# Patient Record
Sex: Female | Born: 1980 | Race: Black or African American | Hispanic: Yes | Marital: Single | Smoking: Never smoker
Health system: Southern US, Community
[De-identification: ages and names within clinical notes are randomized; demographics above are authoritative.]

## PROBLEM LIST (undated history)

## (undated) DIAGNOSIS — M722 Plantar fascial fibromatosis: Secondary | ICD-10-CM

## (undated) DIAGNOSIS — G4730 Sleep apnea, unspecified: Secondary | ICD-10-CM

## (undated) DIAGNOSIS — F419 Anxiety disorder, unspecified: Secondary | ICD-10-CM

## (undated) DIAGNOSIS — T7840XA Allergy, unspecified, initial encounter: Secondary | ICD-10-CM

## (undated) DIAGNOSIS — E669 Obesity, unspecified: Secondary | ICD-10-CM

## (undated) HISTORY — PX: TUBAL LIGATION: SHX77

## (undated) HISTORY — DX: Allergy, unspecified, initial encounter: T78.40XA

## (undated) HISTORY — DX: Obesity, unspecified: E66.9

---

## 2004-11-12 ENCOUNTER — Ambulatory Visit: Payer: Self-pay | Admitting: Family Medicine

## 2004-12-24 ENCOUNTER — Ambulatory Visit: Payer: Self-pay | Admitting: Family Medicine

## 2005-03-05 ENCOUNTER — Ambulatory Visit: Payer: Self-pay | Admitting: Family Medicine

## 2005-07-24 ENCOUNTER — Ambulatory Visit: Payer: Self-pay | Admitting: Family Medicine

## 2005-09-30 ENCOUNTER — Ambulatory Visit: Payer: Self-pay | Admitting: Family Medicine

## 2006-02-26 ENCOUNTER — Ambulatory Visit: Payer: Self-pay | Admitting: Family Medicine

## 2006-03-05 ENCOUNTER — Ambulatory Visit (HOSPITAL_COMMUNITY): Admission: RE | Admit: 2006-03-05 | Discharge: 2006-03-05 | Payer: Self-pay | Admitting: Family Medicine

## 2007-01-13 ENCOUNTER — Ambulatory Visit: Payer: Self-pay | Admitting: Family Medicine

## 2007-04-28 ENCOUNTER — Ambulatory Visit: Payer: Self-pay | Admitting: Family Medicine

## 2007-05-13 ENCOUNTER — Ambulatory Visit: Payer: Self-pay | Admitting: Orthopedic Surgery

## 2007-05-21 ENCOUNTER — Ambulatory Visit (HOSPITAL_COMMUNITY): Admission: RE | Admit: 2007-05-21 | Discharge: 2007-05-21 | Payer: Self-pay | Admitting: Orthopedic Surgery

## 2007-06-01 ENCOUNTER — Ambulatory Visit: Payer: Self-pay | Admitting: Orthopedic Surgery

## 2007-06-04 ENCOUNTER — Ambulatory Visit (HOSPITAL_COMMUNITY): Admission: RE | Admit: 2007-06-04 | Discharge: 2007-06-04 | Payer: Self-pay | Admitting: Orthopedic Surgery

## 2007-06-04 ENCOUNTER — Ambulatory Visit: Payer: Self-pay | Admitting: Orthopedic Surgery

## 2007-06-04 ENCOUNTER — Encounter: Payer: Self-pay | Admitting: Orthopedic Surgery

## 2007-06-07 ENCOUNTER — Ambulatory Visit: Payer: Self-pay | Admitting: Orthopedic Surgery

## 2007-06-16 ENCOUNTER — Ambulatory Visit: Payer: Self-pay | Admitting: Orthopedic Surgery

## 2007-06-22 ENCOUNTER — Ambulatory Visit: Payer: Self-pay | Admitting: Orthopedic Surgery

## 2007-07-26 ENCOUNTER — Encounter (INDEPENDENT_AMBULATORY_CARE_PROVIDER_SITE_OTHER): Payer: Self-pay | Admitting: *Deleted

## 2007-07-26 ENCOUNTER — Encounter: Payer: Self-pay | Admitting: Family Medicine

## 2007-07-26 ENCOUNTER — Other Ambulatory Visit: Admission: RE | Admit: 2007-07-26 | Discharge: 2007-07-26 | Payer: Self-pay | Admitting: Family Medicine

## 2007-07-26 ENCOUNTER — Ambulatory Visit: Payer: Self-pay | Admitting: Family Medicine

## 2007-07-27 ENCOUNTER — Encounter: Payer: Self-pay | Admitting: Family Medicine

## 2007-07-27 LAB — CONVERTED CEMR LAB: GC Probe Amp, Genital: NEGATIVE

## 2007-07-28 ENCOUNTER — Encounter: Payer: Self-pay | Admitting: Family Medicine

## 2007-07-28 LAB — CONVERTED CEMR LAB
Candida species: NEGATIVE
Gardnerella vaginalis: NEGATIVE
Trichomonal Vaginitis: NEGATIVE

## 2007-07-31 ENCOUNTER — Encounter: Payer: Self-pay | Admitting: Family Medicine

## 2007-07-31 LAB — CONVERTED CEMR LAB
Chloride: 104 meq/L (ref 96–112)
Cholesterol: 166 mg/dL (ref 0–200)
Eosinophils Absolute: 0.1 10*3/uL (ref 0.0–0.7)
Glucose, Bld: 87 mg/dL (ref 70–99)
Lymphs Abs: 3 10*3/uL (ref 0.7–3.3)
MCV: 91.1 fL (ref 78.0–100.0)
Neutro Abs: 2.1 10*3/uL (ref 1.7–7.7)
Neutrophils Relative %: 37 % — ABNORMAL LOW (ref 43–77)
Platelets: 319 10*3/uL (ref 150–400)
Potassium: 4.1 meq/L (ref 3.5–5.3)
Sodium: 139 meq/L (ref 135–145)
Total CHOL/HDL Ratio: 2.5
Triglycerides: 46 mg/dL (ref <150)
VLDL: 9 mg/dL (ref 0–40)
WBC: 5.7 10*3/uL (ref 4.0–10.5)

## 2007-08-17 ENCOUNTER — Ambulatory Visit: Payer: Self-pay | Admitting: Family Medicine

## 2007-10-27 ENCOUNTER — Ambulatory Visit: Payer: Self-pay | Admitting: Family Medicine

## 2007-11-03 ENCOUNTER — Encounter (INDEPENDENT_AMBULATORY_CARE_PROVIDER_SITE_OTHER): Payer: Self-pay | Admitting: *Deleted

## 2008-01-27 ENCOUNTER — Encounter: Payer: Self-pay | Admitting: Orthopedic Surgery

## 2008-02-07 ENCOUNTER — Ambulatory Visit: Payer: Self-pay | Admitting: Family Medicine

## 2008-02-08 ENCOUNTER — Encounter: Payer: Self-pay | Admitting: Family Medicine

## 2008-02-10 DIAGNOSIS — E669 Obesity, unspecified: Secondary | ICD-10-CM

## 2008-03-24 ENCOUNTER — Telehealth (INDEPENDENT_AMBULATORY_CARE_PROVIDER_SITE_OTHER): Payer: Self-pay | Admitting: *Deleted

## 2008-03-28 ENCOUNTER — Ambulatory Visit: Payer: Self-pay | Admitting: Orthopedic Surgery

## 2008-03-28 DIAGNOSIS — IMO0002 Reserved for concepts with insufficient information to code with codable children: Secondary | ICD-10-CM | POA: Insufficient documentation

## 2008-04-27 ENCOUNTER — Ambulatory Visit: Payer: Self-pay | Admitting: Orthopedic Surgery

## 2008-08-31 ENCOUNTER — Ambulatory Visit: Payer: Self-pay | Admitting: Family Medicine

## 2008-08-31 LAB — CONVERTED CEMR LAB
Beta hcg, urine, semiquantitative: POSITIVE
Nitrite: NEGATIVE
Protein, U semiquant: NEGATIVE
Urobilinogen, UA: 0.2

## 2008-09-01 ENCOUNTER — Encounter: Payer: Self-pay | Admitting: Family Medicine

## 2008-09-03 DIAGNOSIS — N3000 Acute cystitis without hematuria: Secondary | ICD-10-CM | POA: Insufficient documentation

## 2009-05-25 ENCOUNTER — Ambulatory Visit: Payer: Self-pay | Admitting: Family Medicine

## 2009-05-25 LAB — CONVERTED CEMR LAB
BUN: 12 mg/dL (ref 6–23)
Chloride: 104 meq/L (ref 96–112)
Creatinine, Ser: 0.75 mg/dL (ref 0.40–1.20)
Eosinophils Absolute: 0.2 10*3/uL (ref 0.0–0.7)
Eosinophils Relative: 4 % (ref 0–5)
Glucose, Bld: 78 mg/dL (ref 70–99)
HCT: 38.4 % (ref 36.0–46.0)
Hemoglobin: 12.3 g/dL (ref 12.0–15.0)
LDL Cholesterol: 102 mg/dL — ABNORMAL HIGH (ref 0–99)
Lymphocytes Relative: 56 % — ABNORMAL HIGH (ref 12–46)
Lymphs Abs: 2.8 10*3/uL (ref 0.7–4.0)
MCV: 90.4 fL (ref 78.0–100.0)
Monocytes Absolute: 0.3 10*3/uL (ref 0.1–1.0)
Platelets: 326 10*3/uL (ref 150–400)
Potassium: 4.3 meq/L (ref 3.5–5.3)
RDW: 13.5 % (ref 11.5–15.5)
TSH: 1.637 microintl units/mL (ref 0.350–4.500)
VLDL: 13 mg/dL (ref 0–40)

## 2009-05-30 ENCOUNTER — Telehealth: Payer: Self-pay | Admitting: Family Medicine

## 2009-06-18 ENCOUNTER — Telehealth: Payer: Self-pay | Admitting: Family Medicine

## 2009-07-03 ENCOUNTER — Telehealth: Payer: Self-pay | Admitting: Family Medicine

## 2009-07-24 ENCOUNTER — Telehealth: Payer: Self-pay | Admitting: Family Medicine

## 2009-09-11 ENCOUNTER — Ambulatory Visit: Payer: Self-pay | Admitting: Family Medicine

## 2009-09-13 ENCOUNTER — Telehealth: Payer: Self-pay | Admitting: Family Medicine

## 2009-09-13 ENCOUNTER — Ambulatory Visit: Payer: Self-pay | Admitting: Family Medicine

## 2009-09-28 ENCOUNTER — Emergency Department (HOSPITAL_COMMUNITY): Admission: EM | Admit: 2009-09-28 | Discharge: 2009-09-28 | Payer: Self-pay | Admitting: Emergency Medicine

## 2009-11-14 ENCOUNTER — Ambulatory Visit: Payer: Self-pay | Admitting: Family Medicine

## 2010-01-15 ENCOUNTER — Ambulatory Visit: Payer: Self-pay | Admitting: Family Medicine

## 2010-01-15 DIAGNOSIS — N760 Acute vaginitis: Secondary | ICD-10-CM | POA: Insufficient documentation

## 2010-01-15 LAB — CONVERTED CEMR LAB
Bilirubin Urine: NEGATIVE
Glucose, Urine, Semiquant: NEGATIVE
Specific Gravity, Urine: 1.02
pH: 6.5

## 2010-01-17 ENCOUNTER — Encounter: Payer: Self-pay | Admitting: Physician Assistant

## 2010-01-17 LAB — CONVERTED CEMR LAB
Chlamydia, DNA Probe: NEGATIVE
GC Probe Amp, Genital: NEGATIVE

## 2010-02-11 ENCOUNTER — Ambulatory Visit: Payer: Self-pay | Admitting: Family Medicine

## 2010-02-11 ENCOUNTER — Encounter: Payer: Self-pay | Admitting: Physician Assistant

## 2010-02-11 DIAGNOSIS — J309 Allergic rhinitis, unspecified: Secondary | ICD-10-CM | POA: Insufficient documentation

## 2010-02-11 DIAGNOSIS — J019 Acute sinusitis, unspecified: Secondary | ICD-10-CM | POA: Insufficient documentation

## 2010-04-04 ENCOUNTER — Ambulatory Visit: Payer: Self-pay | Admitting: Family Medicine

## 2010-04-04 DIAGNOSIS — R5381 Other malaise: Secondary | ICD-10-CM | POA: Insufficient documentation

## 2010-04-04 DIAGNOSIS — M545 Low back pain, unspecified: Secondary | ICD-10-CM | POA: Insufficient documentation

## 2010-04-04 DIAGNOSIS — R5383 Other fatigue: Secondary | ICD-10-CM | POA: Insufficient documentation

## 2010-05-02 ENCOUNTER — Telehealth: Payer: Self-pay | Admitting: Family Medicine

## 2010-05-03 ENCOUNTER — Telehealth: Payer: Self-pay | Admitting: Family Medicine

## 2010-06-06 ENCOUNTER — Other Ambulatory Visit: Admission: RE | Admit: 2010-06-06 | Discharge: 2010-06-06 | Payer: Self-pay | Admitting: Family Medicine

## 2010-06-06 ENCOUNTER — Ambulatory Visit: Payer: Self-pay | Admitting: Family Medicine

## 2010-06-06 LAB — CONVERTED CEMR LAB
Basophils Absolute: 0 10*3/uL (ref 0.0–0.1)
CO2: 27 meq/L (ref 19–32)
Calcium: 9 mg/dL (ref 8.4–10.5)
Cholesterol: 162 mg/dL (ref 0–200)
Creatinine, Ser: 0.68 mg/dL (ref 0.40–1.20)
Eosinophils Absolute: 0.1 10*3/uL (ref 0.0–0.7)
Eosinophils Relative: 1 % (ref 0–5)
Glucose, Bld: 79 mg/dL (ref 70–99)
Glucose, Urine, Semiquant: NEGATIVE
HCT: 38.5 % (ref 36.0–46.0)
Hemoglobin: 13.1 g/dL (ref 12.0–15.0)
Lymphocytes Relative: 45 % (ref 12–46)
MCV: 92.1 fL (ref 78.0–100.0)
Monocytes Absolute: 0.4 10*3/uL (ref 0.1–1.0)
Nitrite: NEGATIVE
Protein, U semiquant: NEGATIVE
RDW: 12.6 % (ref 11.5–15.5)
TSH: 1.239 microintl units/mL (ref 0.350–4.500)
Urobilinogen, UA: 0.2
WBC Urine, dipstick: NEGATIVE
pH: 7.5

## 2010-06-07 ENCOUNTER — Encounter: Payer: Self-pay | Admitting: Family Medicine

## 2010-06-07 LAB — CONVERTED CEMR LAB
Candida species: NEGATIVE
Chlamydia, DNA Probe: NEGATIVE
GC Probe Amp, Genital: NEGATIVE

## 2010-06-11 ENCOUNTER — Encounter: Payer: Self-pay | Admitting: Family Medicine

## 2010-07-09 ENCOUNTER — Telehealth: Payer: Self-pay | Admitting: Family Medicine

## 2010-07-10 ENCOUNTER — Ambulatory Visit: Payer: Self-pay | Admitting: Family Medicine

## 2010-10-14 ENCOUNTER — Telehealth: Payer: Self-pay | Admitting: Family Medicine

## 2010-10-24 ENCOUNTER — Ambulatory Visit: Payer: Self-pay | Admitting: Family Medicine

## 2010-11-23 ENCOUNTER — Encounter: Payer: Self-pay | Admitting: Family Medicine

## 2010-11-24 ENCOUNTER — Encounter: Payer: Self-pay | Admitting: Family Medicine

## 2010-12-03 NOTE — Letter (Signed)
Summary: Out of Work  [facility]  [address]   [place], [place] [address]   Phone: [phone]  Fax: [phone]    February 11, 2010   Employee:  NGAN QUALLS Gottsch    To Whom It May Concern:   For Medical reasons, please excuse the above named employee from work for the following dates:  Start:   02/11/10  End:   02/13/10 may return to work without restriction.  If you need additional information, please feel free to contact our office.         Sincerely,    Esperanza Sheets PA

## 2010-12-03 NOTE — Assessment & Plan Note (Signed)
Summary: sick- room 2   Vital Signs:  Patient profile:   30 year old female Menstrual status:  irregular Height:      66 inches Weight:      227.75 pounds BMI:     36.89 O2 Sat:      98 % on Room air Pulse rate:   66 / minute Resp:     16 per minute BP sitting:   116 / 80  (left arm)  Vitals Entered By: Adella Hare LPN (February 11, 2010 9:25 AM) CC: body aches, chills, fatigue, nasal drainage, sore throat Is Patient Diabetic? No Pain Assessment Patient in pain? no        Primary Provider:  Syliva Overman MD  CC:  body aches, chills, fatigue, nasal drainage, and sore throat.  History of Present Illness: Pt is here today with c/o yellow nasal congestion, maxillary sinus pressure, & sore thrt.  Has been tired & had chills too.  Started in the last 3-4 days.  Worsening.  Taking prescription antihistamine & not helping. Not taking any other cold meds, or pain relievers.    Daughter recently had strep throat.  Pt states her throat feels irritated.  No pain with swallowing.  Also has been taking Phenteramine.  States her weight has been going down. Is up today which she wonders if may be because of her menses.  She has an appt next mos for follow up.  Current Medications (verified): 1)  Phentermine Hcl 37.5 Mg Tabs (Phentermine Hcl) .... Take 1 Tablet By Mouth Once A Day 2)  Zyrtec Hives Relief 10 Mg Tabs (Cetirizine Hcl) .... Take 1 Tablet By Mouth Once A Day 3)  Ciprofloxacin Hcl 500 Mg Tabs (Ciprofloxacin Hcl) .... Take 1 Tablet By Mouth Two Times A Day  Allergies (verified): No Known Drug Allergies  Past History:  Past medical history reviewed for relevance to current acute and chronic problems.  Past Medical History: Reviewed history from 11/03/2007 and no changes required. OBESITY (ICD-278.00)  Review of Systems General:  Complains of chills and fatigue; denies fever. ENT:  Complains of nasal congestion, postnasal drainage, sinus pressure, and sore throat; denies  earache. CV:  Denies chest pain or discomfort. Resp:  Complains of cough; denies sputum productive.  Physical Exam  General:  Well-developed,well-nourished,in no acute distress; alert,appropriate and cooperative throughout examination Head:  Normocephalic and atraumatic without obvious abnormalities. No apparent alopecia or balding. Ears:  External ear exam shows no significant lesions or deformities.  Otoscopic examination reveals clear canals, tympanic membranes are intact bilaterally without bulging, retraction, inflammation or discharge. Hearing is grossly normal bilaterally. Nose:  no external deformity, mucosal erythema, mucosal edema, L frontal sinus tenderness, L maxillary sinus tenderness, R frontal sinus tenderness, and R maxillary sinus tenderness.   Mouth:  good dentition.  Oroph cobblestoned, otherwise neg Neck:  No deformities, masses, or tenderness noted. Lungs:  Normal respiratory effort, chest expands symmetrically. Lungs are clear to auscultation, no crackles or wheezes. Heart:  Normal rate and regular rhythm. S1 and S2 normal without gallop, murmur, click, rub or other extra sounds. Cervical Nodes:  No lymphadenopathy noted Psych:  Cognition and judgment appear intact. Alert and cooperative with normal attention span and concentration. No apparent delusions, illusions, hallucinations   Impression & Recommendations:  Problem # 1:  SINUSITIS, ACUTE (ICD-461.9) Assessment New  The following medications were removed from the medication list:    Ciprofloxacin Hcl 500 Mg Tabs (Ciprofloxacin hcl) .Marland Kitchen... Take 1 tablet by mouth two times  a day Her updated medication list for this problem includes:    Cephalexin 500 Mg Tabs (Cephalexin) .Marland Kitchen... Take 2 two times a day x 10 days  Instructed on treatment. Call if symptoms persist or worsen.   Problem # 2:  ALLERGIC RHINITIS (ICD-477.9) Assessment: Unchanged Will continue antihistamine as needed. Her updated medication list for  this problem includes:    Zyrtec Hives Relief 10 Mg Tabs (Cetirizine hcl) .Marland Kitchen... Take 1 tablet by mouth once a day  Problem # 3:  OBESITY (ICD-278.00) Wt is down 1 # from prev visit.  Will continue Phentermine.  Encouraged healthy diet & exercise.  Ht: 66 (02/11/2010)   Wt: 227.75 (02/11/2010)   BMI: 36.89 (02/11/2010)  Complete Medication List: 1)  Phentermine Hcl 37.5 Mg Tabs (Phentermine hcl) .... Take 1 tablet by mouth once a day 2)  Zyrtec Hives Relief 10 Mg Tabs (Cetirizine hcl) .... Take 1 tablet by mouth once a day 3)  Cephalexin 500 Mg Tabs (Cephalexin) .... Take 2 two times a day x 10 days 4)  Fluconazole 150 Mg Tabs (Fluconazole) .... Take 1 by mouth as needed for yeast infection  Patient Instructions: 1)  Please schedule a follow-up appointment as needed. 2)  Keep your appt in May with Dr Lodema Hong. 3)  Get plenty of rest, drink lots of clear liquids, and use Tylenol or Ibuprofen for fever and comfort. Return in 7-10 days if you're not better:sooner if you're feeling worse. 4)  I have prescribed an antibiotics for you.  Take as directed on the bottle. Prescriptions: FLUCONAZOLE 150 MG TABS (FLUCONAZOLE) take 1 by mouth as needed for yeast infection  #2 x 0   Entered and Authorized by:   Esperanza Sheets PA   Signed by:   Esperanza Sheets PA on 02/11/2010   Method used:   Electronically to        [facility] (retail)       [address]       [place], [place]  [address]       Ph: [phone]       Fax: [phone]   RxID:   [id number] CEPHALEXIN 500 MG TABS (CEPHALEXIN) take 2 two times a day x 10 days  #40 x 0   Entered and Authorized by:   Esperanza Sheets PA   Signed by:   Esperanza Sheets PA on 02/11/2010   Method used:   Electronically to        [facility] (retail)       [address]       [place], [place]  [address]       Ph: [phone]       Fax: [phone]   RxID:   [id number]

## 2010-12-03 NOTE — Progress Notes (Signed)
  Phone Note Call from Patient   Summary of Call: Requesting refill on fluconazole 150mg  [facility] Initial call taken by: Everitt Amber LPN,  May 04, 5955 9:43 AM  Follow-up for Phone Call        pls advis pt to use otc mionistat Follow-up by: Syliva Overman MD,  May 03, 2010 11:04 AM  Additional Follow-up for Phone Call Additional follow up Details #1::        Patient didn't call and request, it was sent from the pharmacy. Called patient and no answer. If she calls requesting med I will advise her to use otc monistat Additional Follow-up by: Everitt Amber LPN,  May 03, 3874 12:58 PM

## 2010-12-03 NOTE — Assessment & Plan Note (Signed)
Summary: office visit   Vital Signs:  Patient profile:   30 year old female Menstrual status:  irregular Height:      66 inches Weight:      231.25 pounds BMI:     37.46 O2 Sat:      98 % Pulse rate:   89 / minute Pulse rhythm:   regular Resp:     16 per minute BP sitting:   120 / 80  (left arm) Cuff size:   large  Vitals Entered By: Everitt Amber LPN (June 06, 2010 10:06 AM)  Nutrition Counseling: Patient's BMI is greater than 25 and therefore counseled on weight management options. CC: CPE   Vision Screening:Left eye w/o correction: 20 / 20 Right Eye w/o correction: 20 / 20 Both eyes w/o correction:  20/ 20  Color vision testing: normal      Vision Entered By: Everitt Amber LPN (June 06, 2010 1:58 PM)   Primary Care Provider:  Syliva Overman MD  CC:  CPE .  History of Present Illness: Reports  that she has been  doing well. She continues to gain weight and is concerned about this and wants help. She is inconsistent in attemppts to lose weight. Denies recent fever or chills. Denies sinus pressure, nasal congestion , ear pain or sore throat. Denies chest congestion, or cough productive of sputum. Denies chest pain, palpitations, PND, orthopnea or leg swelling. Denies abdominal pain, nausea, vomitting, diarrhea or constipation. Denies change in bowel movements or bloody stool. Denies dincontinence or hesitancy. Denies  joint pain, swelling, or reduced mobility. Denies headaches, vertigo, seizures. Denies depression, anxiety or insomnia. Denies  rash, lesions, or itch.     Current Medications (verified): 1)  Phentermine Hcl 37.5 Mg Tabs (Phentermine Hcl) .... Take 1 Tablet By Mouth Once A Day 2)  Zyrtec Hives Relief 10 Mg Tabs (Cetirizine Hcl) .... Take 1 Tablet By Mouth Once A Day 3)  Ibuprofen 800 Mg Tabs (Ibuprofen) .... 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 Eyes:  Denies blurring and discharge. GU:  Complains of discharge, dysuria, and urinary frequency; 3 day history. Endo:  Denies cold intolerance, excessive hunger, excessive thirst, excessive urination, heat intolerance, polyuria, and weight change. Heme:  Denies abnormal bruising and bleeding. Allergy:  Denies hives or rash and itching eyes.  Physical Exam  General:  Well-developed,obese,in no acute distress; alert,appropriate and cooperative throughout examination Head:  Normocephalic and atraumatic without obvious abnormalities. No apparent alopecia or balding. Eyes:  No corneal or conjunctival inflammation noted. EOMI. Perrla. Funduscopic exam benign, without hemorrhages, exudates or papilledema. Vision grossly normal. Ears:  External ear exam shows no significant lesions or deformities.  Otoscopic examination reveals clear canals, tympanic membranes are intact bilaterally without bulging, retraction, inflammation or discharge. Hearing is grossly normal bilaterally. Nose:  External nasal examination shows no deformity or inflammation. Nasal mucosa are pink and moist without lesions or exudates. Mouth:  Oral mucosa and oropharynx without lesions or exudates.  Teeth in good repair. Neck:  No deformities, masses, or tenderness  noted. Chest Wall:  No deformities, masses, or tenderness noted. Breasts:  No mass, nodules, thickening, tenderness, bulging, retraction, inflamation, nipple discharge or skin changes noted.   Lungs:  Normal respiratory effort, chest expands symmetrically. Lungs are clear to auscultation, no crackles or wheezes. Heart:  Normal rate and regular rhythm. S1 and S2 normal  without gallop, murmur, click, rub or other extra sounds. Abdomen:  Bowel sounds positive,abdomen soft and non-tender without masses, organomegaly or hernias noted. Genitalia:  Normal introitus for age, no external lesions, white vaginal discharge, mucosa pink and moist, no vaginal or cervical lesions, no vaginal atrophy, no friaility or hemorrhage, normal uterus size and position, no adnexal masses or tenderness Msk:  No deformity or scoliosis noted of thoracic or lumbar spine.   Pulses:  R and L carotid,radial,femoral,dorsalis pedis and posterior tibial pulses are full and equal bilaterally Extremities:  No clubbing, cyanosis, edema, or deformity noted with normal full range of motion of all joints.   Neurologic:  No cranial nerve deficits noted. Station and gait are normal. Plantar reflexes are down-going bilaterally. DTRs are symmetrical throughout. Sensory, motor and coordinative functions appear intact. Skin:  Intact without suspicious lesions or rashes Cervical Nodes:  No lymphadenopathy noted Axillary Nodes:  No palpable lymphadenopathy Inguinal Nodes:  No significant adenopathy Psych:  Cognition and judgment appear intact. Alert and cooperative with normal attention span and concentration. No apparent delusions, illusions, hallucinations   Impression & Recommendations:  Problem # 1:  SCREENING FOR MALIGNANT NEOPLASM OF THE CERVIX (ICD-V76.2) Assessment Comment Only  Orders: Pap Smear ([address])  Problem # 2:  ACUTE CYSTITIS (ICD-595.0) Assessment: Comment Only  Orders: UA Dipstick W/ Micro (manual) (81000)  Encouraged to push clear liquids, get enough rest, and take acetaminophen as needed. To be seen in 10 days if no improvement, sooner if worse.  Problem # 3:  VULVOVAGINITIS (ICD-616.10) Assessment: Comment Only  Orders: T-Wet Prep by Molecular Probe [id number]) T-Chlamydia & GC Probe, Genital ([id number])  Problem # 4:  OBESITY (ICD-278.00) Assessment:  Deteriorated  Ht: 66 (06/06/2010)   Wt: 231.25 (06/06/2010)   BMI: 37.46 (06/06/2010)  Complete Medication List: 1)  Zyrtec Hives Relief 10 Mg Tabs (Cetirizine hcl) .... Take 1 tablet by mouth once a day 2)  Ibuprofen 800 Mg Tabs (Ibuprofen) .... Take 1 three times a day as needed for back pain 3)  Phentermine Hcl 37.5 Mg Caps (Phentermine hcl) .... Take 1 capsule by mouth once a day  Other Orders: T-Basic Metabolic Panel [id number]) T-CBC w/Diff [id number]) T-Lipid Profile [id number]) T-TSH [phone])  Patient Instructions: 1)  Please schedule a follow-up appointment in 4 weeks 2)  It is important that you exercise regularly at least 60 minutes 7 times a week. If you develop chest pain, have severe difficulty breathing, or feel very tired , stop exercising immediately and seek medical attention. 3)  You need to lose weight. Consider a lower calorie diet and regular exercise. goal is 5 to8 pounds 4)  pls start taking one phenterminwe daily, and it is essential that you eat regulalrly. 5)  I will discuss at legnth healthyeating with you, and give you additional info Prescriptions: PHENTERMINE HCL 37.5 MG CAPS (PHENTERMINE HCL) Take 1 capsule by mouth once a day  #30 x 0   Entered and Authorized by:   Syliva Overman MD   Signed by:   Syliva Overman MD on 06/06/2010   Method used:   Printed then faxed to ...       [facility] [address] [facility] ([facility])       [address]       [place], [place]  [address]       Ph: [phone]       Fax: [phone]   RxID:   [id number]    Laboratory Results   Urine Tests    Routine Urinalysis   Color: lt. yellow Appearance: Clear Glucose: negative   (Normal Range: Negative) Bilirubin: negative   (Normal Range: Negative) Ketone: negative   (Normal Range: Negative) Spec. Gravity: 1.020   (Normal Range: 1.003-1.035) Blood: moderate   (Normal Range: Negative) pH: 7.5   (Normal Range: 5.0-8.0) Protein:  negative   (Normal Range: Negative) Urobilinogen: 0.2   (Normal Range: 0-1) Nitrite: negative   (Normal Range: Negative) Leukocyte Esterace: negative   (Normal Range: Negative)    Comments: period is on

## 2010-12-03 NOTE — Assessment & Plan Note (Signed)
Summary: office visit   Vital Signs:  Patient profile:   30 year old female Menstrual status:  irregular Height:      66 inches Weight:      233 pounds BMI:     37.74 O2 Sat:      98 % on Room air Pulse rate:   101 / minute Pulse rhythm:   regular Resp:     16 per minute BP sitting:   120 / 72  (left arm)  Vitals Entered By: [name] LPN (November 14, 2009 3:45 PM)  Nutrition Counseling: Patient's BMI is greater than 25 and therefore counseled on weight management options.  O2 Flow:  Room air CC: follow up/ cough and congestion and ear ache Is Patient Diabetic? No Pain Assessment Patient in pain? no        Primary Care Provider:  Syliva Overman MD  CC:  follow up/ cough and congestion and ear ache.  History of Present Illness: Pt acutely ill over the past 2 days with resppiratory symptoms. She has no major health concerns otherwise , apart from the fact that she has gained weight. She has not been exercisisng, has overdone sweets during the holidays, and had also recently dioscontinued phentermine, which she states helps alot and she wants to resume it.  Current Medications (verified): 1)  Phentermine Hcl 37.5 Mg Caps (Phentermine Hcl) .... Take 1 Tablet By Mouth Once A Day  Allergies (verified): No Known Drug Allergies  Review of Systems      See HPI General:  Complains of chills, fatigue, and fever; 2 day history of intermittent chills and fever. Eyes:  Denies blurring and discharge. ENT:  Complains of earache, nasal congestion, postnasal drainage, and sinus pressure; 2 day history, left ear pressure, green post nasal drainage with sinus pressure. CV:  Denies chest pain or discomfort, palpitations, and swelling of feet. Resp:  Complains of cough and sputum productive; yellow sputum and cough x 2 days. GI:  Denies abdominal pain, constipation, nausea, and vomiting. GU:  Denies dysuria and urinary frequency. MS:  Denies joint pain and joint swelling. Neuro:   Complains of headaches; denies seizures and sensation of room spinning; new headache associated with sinus pressure. Psych:  Denies anxiety and depression.  Physical Exam  General:  Well-developed,obese,in no acute distress; alert,appropriate and cooperative throughout examination HEENT: No facial asymmetry,  EOMI,frontal and maxillary sinus tenderness,Right  TM Clear,left Tm dull, fluid behind drum oropharynx  pink and moist.   Chest: decreased air entry , bilateral crackles and few wheezes  CVS: S1, S2, No murmurs, No S3.   Abd: Soft, Nontender.  MS: Adequate ROM spine, hips, shoulders and knees.  Ext: No edema.   CNS: CN 2-12 intact, power tone and sensation normal throughout.   Skin: Intact, no visible lesions or rashes.  Psych: Good eye contact, normal affect.  Memory intact, not anxious or depressed appearing.    Impression & Recommendations:  Problem # 1:  ACUTE BRONCHITIS (ICD-466.0) Assessment Comment Only  The following medications were removed from the medication list:    Septra Ds 800-160 Mg Tabs (Sulfamethoxazole-trimethoprim) .Marland Kitchen... Take 1 tablet by mouth two times a day Her updated medication list for this problem includes:    Penicillin V Potassium 500 Mg Tabs (Penicillin v potassium) .Marland Kitchen... Take 1 tablet by mouth three times a day    Tessalon Perles 100 Mg Caps (Benzonatate) .Marland Kitchen... Take 1 capsule by mouth three times a day  Problem # 2:  ACUTE SINUSITIS,  UNSPECIFIED (ICD-461.9) Assessment: Comment Only  The following medications were removed from the medication list:    Septra Ds 800-160 Mg Tabs (Sulfamethoxazole-trimethoprim) .Marland Kitchen... Take 1 tablet by mouth two times a day Her updated medication list for this problem includes:    Penicillin V Potassium 500 Mg Tabs (Penicillin v potassium) .Marland Kitchen... Take 1 tablet by mouth three times a day    Tessalon Perles 100 Mg Caps (Benzonatate) .Marland Kitchen... Take 1 capsule by mouth three times a day  Problem # 3:  LOM  (ICD-382.9) Assessment: Comment Only  The following medications were removed from the medication list:    Septra Ds 800-160 Mg Tabs (Sulfamethoxazole-trimethoprim) .Marland Kitchen... Take 1 tablet by mouth two times a day Her updated medication list for this problem includes:    Penicillin V Potassium 500 Mg Tabs (Penicillin v potassium) .Marland Kitchen... Take 1 tablet by mouth three times a day  Problem # 4:  OBESITY (ICD-278.00) Assessment: Deteriorated  Ht: 66 (11/14/2009)   Wt: 233 (11/14/2009)   BMI: 37.74 (11/14/2009), pt to resume phentermine one daily an regular exercise with dietary change  Complete Medication List: 1)  Phentermine Hcl 37.5 Mg Caps (Phentermine hcl) .... Take 1 tablet by mouth once a day 2)  Penicillin V Potassium 500 Mg Tabs (Penicillin v potassium) .... Take 1 tablet by mouth three times a day 3)  Tessalon Perles 100 Mg Caps (Benzonatate) .... Take 1 capsule by mouth three times a day 4)  Fluconazole 150 Mg Tabs (Fluconazole) .... Take 1 tablet by mouth once a day as needed  Patient Instructions: 1)  Please schedule a follow-up appointment in 2 months. 2)  It is important that you exercise regularly at least 20 minutes 5 times a week. If you develop chest pain, have severe difficulty breathing, or feel very tired , stop exercising immediately and seek medical attention. 3)  You need to lose weight. Consider a lower calorie diet and regular exercise.  4)  You are being rtreated for sinusitis and bronchitis and left serous otitis, meda are sent to your pharmacy Prescriptions: FLUCONAZOLE 150 MG TABS (FLUCONAZOLE) Take 1 tablet by mouth once a day as needed  #3 x 0   Entered and Authorized by:   Syliva Overman MD   Signed by:   Syliva Overman MD on 11/14/2009   Method used:   Printed then faxed to ...       [facility] [address]       [address]       [place], [place]  [address]       Ph: [phone]       Fax: [phone]   RxID:    [id number] TESSALON PERLES 100 MG CAPS (BENZONATATE) Take 1 capsule by mouth three times a day  #30 x 0   Entered and Authorized by:   Syliva Overman MD   Signed by:   Syliva Overman MD on 11/14/2009   Method used:   Printed then faxed to ...       [facility] (retail)       [address]       [place], [place]  [address]       Ph: [phone]       Fax: [phone]   RxID:   [id number] PENICILLIN V POTASSIUM 500 MG TABS (PENICILLIN V POTASSIUM) Take 1 tablet by mouth three times a day  #  30 x 0   Entered and Authorized by:   Syliva Overman MD   Signed by:   Syliva Overman MD on 11/14/2009   Method used:   Electronically to        [facility] (retail)       [address]       [place], [place]  [address]       Ph: [phone]       Fax: [phone]   RxID:   [id number]

## 2010-12-03 NOTE — Letter (Signed)
Summary: Out of Work  [facility]  [address]   [place], [place] [address]   Phone: [phone]  Fax: [phone]    November 14, 2009   Employee:  ANANDA CAYA Paull    To Whom It May Concern:   For Medical reasons, please excuse the above named employee from work for the following dates:  Start:   11/14/09  End:   11/19/09 to return with no restrictions  If you need additional information, please feel free to contact our office.         Sincerely,    Milus Mallick. Lodema Hong, MD

## 2010-12-03 NOTE — Letter (Signed)
Summary: Letter  Letter   Imported By: Lind Guest 06/12/2010 10:53:12  _____________________________________________________________________  External Attachment:    Type:   Image     Comment:   External Document

## 2010-12-03 NOTE — Progress Notes (Signed)
Summary: rx for sinus infection  Phone Note Call from Patient   Summary of Call: would like to get a rx for sinus infection. [phone]  [place] Initial call taken by: Rudene Anda,  July 09, 2010 3:26 PM  Follow-up for Phone Call        this patient needs to schedule ov Follow-up by: Adella Hare LPN,  July 09, 2010 3:31 PM  Additional Follow-up for Phone Call Additional follow up Details #1::        left message for pt to call office for appt.  Additional Follow-up by: Rudene Anda,  July 10, 2010 8:03 AM

## 2010-12-03 NOTE — Progress Notes (Signed)
Summary: med  Phone Note Call from Patient   Summary of Call: pt needs a diflucan pill called into pharm [phone] Initial call taken by: Rudene Anda,  May 02, 2010 9:19 AM  Follow-up for Phone Call        returned call, left message Follow-up by: Adella Hare LPN,  May 02, 2010 11:38 AM  Additional Follow-up for Phone Call Additional follow up Details #1::        pls advise otc monistat Additional Follow-up by: Syliva Overman MD,  May 02, 2010 11:48 AM    Additional Follow-up for Phone Call Additional follow up Details #2::    PATIENT AWARE  Follow-up by: Lind Guest,  May 03, 2010 8:05 AM

## 2010-12-03 NOTE — Assessment & Plan Note (Signed)
Summary: weight and fatigue - room 1   Vital Signs:  Patient profile:   30 year old female Menstrual status:  irregular Height:      66 inches Weight:      224.25 pounds BMI:     36.33 O2 Sat:      99 % on Room air Pulse rate:   88 / minute Resp:     16 per minute BP sitting:   110 / 70  (left arm)  Vitals Entered By: Adella Hare LPN (April 04, 1609 3:14 PM)  Nutrition Counseling: Patient's BMI is greater than 25 and therefore counseled on weight management options. CC: fatigue and weight Is Patient Diabetic? No Pain Assessment Patient in pain? no        Primary Provider:  Syliva Overman MD  CC:  fatigue and weight.  History of Present Illness: Pt is here today with c/o fatigue x 1 week.  Sometimes has difficulty falling asleep, and usually gets up once a night to use the restroom & get a drink.  Is able to fall back asleep OK.  This is no change for her.  Also c/o LBP x 1 week.  No prev hx of.  Uncertain if lifted something heavy at work.  Not taking any meds for.  No dysuria or frequency.  Pt has intermittent pain in her lower legs.  She is somewhat vague about this.  She did have the pain before her back started hurting.  She points to her anterior shin area & states it comes & goes.  Trying to lose wt.  Has decreased her junk food.  Is taking Phentermine daily.  Walks about 3 days per week.    Hx of allergic rhinitis.  Not taking her allergy meds.  No fever, chills, or sinus pressure.   Current Medications (verified): 1)  Phentermine Hcl 37.5 Mg Tabs (Phentermine Hcl) .... Take 1 Tablet By Mouth Once A Day 2)  Zyrtec Hives Relief 10 Mg Tabs (Cetirizine Hcl) .... Take 1 Tablet By Mouth Once A Day  Allergies (verified): No Known Drug Allergies  Past History:  Past medical history reviewed for relevance to current acute and chronic problems.  Past Medical History: Reviewed history from 11/03/2007 and no changes required. OBESITY  (ICD-278.00)  Review of Systems General:  Complains of fatigue; denies chills and fever. ENT:  Complains of nasal congestion; denies earache, sinus pressure, and sore throat. CV:  Denies chest pain or discomfort. Resp:  Complains of cough; denies shortness of breath and sputum productive. GU:  Denies dysuria and urinary frequency. MS:  Complains of low back pain. Neuro:  Denies numbness and tingling.  Physical Exam  General:  Well-developed,well-nourished,in no acute distress; alert,appropriate and cooperative throughout examination Head:  Normocephalic and atraumatic without obvious abnormalities. No apparent alopecia or balding. Ears:  External ear exam shows no significant lesions or deformities.  Otoscopic examination reveals clear canals, tympanic membranes are intact bilaterally without bulging, retraction, inflammation or discharge. Hearing is grossly normal bilaterally. Nose:  Nasal turbs mod swollen and bluishno external deformity and no sinus percussion tenderness.   Mouth:  Oral mucosa and oropharynx without lesions or exudates.  Teeth in good repair. Neck:  No deformities, masses, or tenderness noted. Lungs:  Normal respiratory effort, chest expands symmetrically. Lungs are clear to auscultation, no crackles or wheezes. Heart:  Normal rate and regular rhythm. S1 and S2 normal without gallop, murmur, click, rub or other extra sounds. Msk:  TTP Bilat L5  S1 area.  Nontender sciatic notch. Able to stand heels and toes.  FROM. Pulses:  R posterior tibial normal, R dorsalis pedis normal, L posterior tibial normal, and L dorsalis pedis normal.   Extremities:  No clubbing, cyanosis, edema, or deformity noted with normal full range of motion of all joints.   Neurologic:  alert & oriented X3, strength normal in all extremities, gait normal, and DTRs symmetrical and normal.   Skin:  Intact without suspicious lesions or rashes Cervical Nodes:  No lymphadenopathy noted Psych:  Cognition  and judgment appear intact. Alert and cooperative with normal attention span and concentration. No apparent delusions, illusions, hallucinations   Impression & Recommendations:  Problem # 1:  ALLERGIC RHINITIS (ICD-477.9) Assessment Deteriorated  Her updated medication list for this problem includes:    Zyrtec Hives Relief 10 Mg Tabs (Cetirizine hcl) .Marland Kitchen... Take 1 tablet by mouth once a day  Problem # 2:  FATIGUE (ICD-780.79) Assessment: New  Orders: T-CBC No Diff ([id number]) T-TSH ([id number])  Problem # 3:  BACK PAIN, LUMBAR (ICD-724.2) Assessment: New  Her updated medication list for this problem includes:    Ibuprofen 800 Mg Tabs (Ibuprofen) .Marland Kitchen... Take 1 three times a day as needed for back pain  Problem # 4:  OBESITY (ICD-278.00) Assessment: Improved Pt has only lost 3 # in the last couple of mos. Discussed diet and increase aerobic exercise.  Also encouraged strength training. Portion sized h/o given.  Complete Medication List: 1)  Phentermine Hcl 37.5 Mg Tabs (Phentermine hcl) .... Take 1 tablet by mouth once a day 2)  Zyrtec Hives Relief 10 Mg Tabs (Cetirizine hcl) .... Take 1 tablet by mouth once a day 3)  Ibuprofen 800 Mg Tabs (Ibuprofen) .... Take 1 three times a day as needed for back pain  Patient Instructions: 1)  Please schedule a follow-up appointment in 1 month. 2)  Increase your exercise and improve your diet. 3)  I have refilled your Phentermine.  You need to lose at least 4 pounds in the next month in order to consider this medication. 4)  I have ordered blood work. 5)  I have prescribed Ibuprofen for your back pain. Prescriptions: IBUPROFEN 800 MG TABS (IBUPROFEN) take 1 three times a day as needed for back pain  #30 x 0   Entered and Authorized by:   Esperanza Sheets PA   Signed by:   Esperanza Sheets PA on 04/04/2010   Method used:   Printed then faxed to ...       [facility] (retail)       [address]       [place], [place]  [address]       Ph: [phone]       Fax: [phone]   RxID:   [id number] PHENTERMINE HCL 37.5 MG TABS (PHENTERMINE HCL) Take 1 tablet by mouth once a day  #30 x 0   Entered and Authorized by:   Esperanza Sheets PA   Signed by:   Esperanza Sheets PA on 04/04/2010   Method used:   Printed then faxed to ...       [facility] (retail)       [address]       [place], [place]  [address]       Ph: [phone]       Fax: [phone]   RxID:  [id number]  

## 2010-12-03 NOTE — Assessment & Plan Note (Signed)
Summary: sick- room 1   Vital Signs:  Patient profile:   30 year old female Menstrual status:  irregular Height:      66 inches Weight:      223.25 pounds BMI:     36.16 O2 Sat:      100 % on Room air Temp:     98.5 degrees F Pulse rate:   106 / minute Resp:     16 per minute BP sitting:   110 / 70  (left arm)  Vitals Entered By: Adella Hare LPN (July 10, 2010 3:19 PM) CC: head congestion, chills, body aches, headache Is Patient Diabetic? No Pain Assessment Patient in pain? no      Comments did not bring meds to ov also wants refill on phentermine   Primary Provider:  Syliva Overman MD  CC:  head congestion, chills, body aches, and headache.  History of Present Illness: Pt presents today with c/o nasal congestion, post nasal drainage and cough.  Sxs worsening x 5-6 days.  Has not tried any over the counter cold meds.  No fever.   Has been taking Phentermine and doing well.  Makes her mouth dry and she wonders if this is nl.  No palpitations.  She is eating less and healthier.  She is happy with her wt loss and states she is feeling much better overall.  Denies: Headache, or dizziness. Chest pain or discomfort, palpitations, and peripheral edema. Abd pain, indigestion, and no change in bowel habits. No joint pain or swelling.    Allergies (verified): No Known Drug Allergies  Past History:  Past medical history reviewed for relevance to current acute and chronic problems.  Past Medical History: Reviewed history from 11/03/2007 and no changes required. OBESITY (ICD-278.00)  Review of Systems      See HPI General:  Complains of chills; denies fever. ENT:  Complains of nasal congestion, postnasal drainage, and sinus pressure; denies earache and sore throat. GI:  Denies abdominal pain, change in bowel habits, nausea, and vomiting.  Physical Exam  General:  Well-developed,well-nourished,in no acute distress; alert,appropriate and cooperative throughout  examination Head:  Normocephalic and atraumatic without obvious abnormalities. No apparent alopecia or balding. Ears:  External ear exam shows no significant lesions or deformities.  Otoscopic examination reveals clear canals, tympanic membranes are intact bilaterally without bulging, retraction, inflammation or discharge. Hearing is grossly normal bilaterally. Nose:  no external deformity.  nasal turbs mod swollen and erythematous. + TTP bilat maxillary sinuses Mouth:  Oral mucosa and oropharynx without lesions or exudates.  Teeth in good repair. Neck:  No deformities, masses, or tenderness noted. Lungs:  Normal respiratory effort, chest expands symmetrically. Lungs are clear to auscultation, no crackles or wheezes. Heart:  Normal rate and regular rhythm. S1 and S2 normal without gallop, murmur, click, rub or other extra sounds. Cervical Nodes:  No lymphadenopathy noted Psych:  Cognition and judgment appear intact. Alert and cooperative with normal attention span and concentration. No apparent delusions, illusions, hallucinations   Impression & Recommendations:  Problem # 1:  SINUSITIS, ACUTE (ICD-461.9) Assessment New  Her updated medication list for this problem includes:    Amoxicillin 875 Mg Tabs (Amoxicillin) .Marland Kitchen... Take 1 two times a day x 10 days  Problem # 2:  OBESITY (ICD-278.00) Assessment: Improved Congratulated and encouraged pt with her wt loss.  Ht: 66 (07/10/2010)   Wt: 223.25 (07/10/2010)   BMI: 36.16 (07/10/2010)  Complete Medication List: 1)  Zyrtec Hives Relief 10 Mg Tabs (Cetirizine  hcl) .... Take 1 tablet by mouth once a day 2)  Ibuprofen 800 Mg Tabs (Ibuprofen) .... Take 1 three times a day as needed for back pain 3)  Phentermine Hcl 37.5 Mg Caps (Phentermine hcl) .... Take 1 capsule by mouth once a day 4)  Amoxicillin 875 Mg Tabs (Amoxicillin) .... Take 1 two times a day x 10 days 5)  Fluconazole 150 Mg Tabs (Fluconazole) .... Take 1 by mouth every 3 days as  needed for yeast infection  Patient Instructions: 1)  Get plenty of rest, drink lots of clear liquids, and use Tylenol or Ibuprofen for fever and comfort. Return in 7-10 days if you're not better:sooner if you're feeling worse. 2)  You may use an over the counter cold medication with a decongestant but stop your Phentermine for a couple days. 3)  I have prescribed an antibiotic for you. 4)  I have refilled your allergy medicine. Prescriptions: FLUCONAZOLE 150 MG TABS (FLUCONAZOLE) take 1 by mouth every 3 days as needed for yeast infection  #2 x 0   Entered and Authorized by:   Esperanza Sheets PA   Signed by:   Esperanza Sheets PA on 07/10/2010   Method used:   Electronically to        [facility] (retail)       [address]       [place], [place]  [address]       Ph: [phone]       Fax: [phone]   RxID:   [id number] AMOXICILLIN 875 MG TABS (AMOXICILLIN) take 1 two times a day x 10 days  #20 x 0   Entered and Authorized by:   Esperanza Sheets PA   Signed by:   Esperanza Sheets PA on 07/10/2010   Method used:   Electronically to        [facility] (retail)       [address]       [place], [place]  [address]       Ph: [phone]       Fax: [phone]   RxID:   [id number] ZYRTEC HIVES RELIEF 10 MG TABS (CETIRIZINE HCL) Take 1 tablet by mouth once a day  #30 x 4   Entered and Authorized by:   Esperanza Sheets PA   Signed by:   Esperanza Sheets PA on 07/10/2010   Method used:   Electronically to        [facility] (retail)       [address]       [place], [place]  [address]       Ph: [phone]       Fax: [phone]   RxID:   [id number] PHENTERMINE HCL 37.5 MG CAPS (PHENTERMINE HCL) Take 1 capsule by mouth once a day  #30 x 0   Entered and Authorized by:   Esperanza Sheets PA   Signed by:   Esperanza Sheets PA on 07/10/2010   Method used:   Printed then faxed to ...       [facility] (retail)       [address]       [place], [place]  [address]       Ph: [phone]       Fax: [phone]   RxID:  [id number] ZYRTEC HIVES RELIEF 10 MG TABS (CETIRIZINE HCL) Take 1 tablet by mouth once a day  #30 x 4   Entered and Authorized by:   Esperanza Sheets PA   Signed by:   Esperanza Sheets PA on 07/10/2010   Method used:   Electronically to        [facility] (retail)       [address]       [place], [place]  [address]       Ph: [phone]       Fax: [phone]   RxID:   [id number]  pt requested change pharmacy to [facility]. Esperanza Sheets PA  July 10, 2010 3:28 PM

## 2010-12-03 NOTE — Assessment & Plan Note (Signed)
Summary: office visit   Vital Signs:  Patient profile:   30 year old female Menstrual status:  irregular Height:      66 inches Weight:      228 pounds BMI:     36.93 O2 Sat:      98 % Pulse rate:   88 / minute Pulse rhythm:   regular Resp:     16 per minute BP sitting:   112 / 84 Cuff size:   large  Vitals Entered By: Everitt Amber LPN (January 15, 2010 3:33 PM)  Nutrition Counseling: Patient's BMI is greater than 25 and therefore counseled on weight management options. CC: Follow up chronic problems   Primary Care Provider:  Syliva Overman MD  CC:  Follow up chronic problems.  History of Present Illness: Reports  that she has been doinfg fairly well.  She does notice reduction in her apetite, with phentermine and wants to contnue this. She laso intends to inc her exercise routine. Denies recent fever or chills. Denies sinus pressure, nasal congestion , ear pain or sore throat. Denies chest congestion, or cough productive of sputum. Denies chest pain, palpitations, PND, orthopnea or leg swelling. Denies abdominal pain, nausea, vomitting, diarrhea or constipation. Denies change in bowel movements or bloody stool.  Denies  joint pain, swelling, or reduced mobility. Denies headaches, vertigo, seizures. Denies depression, anxiety or insomnia. Denies  rash, lesions, or itch.     Current Medications (verified): 1)  Phentermine Hcl 37.5 Mg Caps (Phentermine Hcl) .... Take 1 Tablet By Mouth Once A Day  Allergies (verified): No Known Drug Allergies  Review of Systems      See HPI GU:  Complains of discharge, dysuria, and urinary frequency; 1 week his, including  low back pain , no fever or chills. Endo:  Denies cold intolerance, excessive hunger, excessive thirst, excessive urination, heat intolerance, polyuria, and weight change. Heme:  Denies abnormal bruising and bleeding. Allergy:  Complains of seasonal allergies.  Physical Exam  General:   Well-developed,well-nourished,in no acute distress; alert,appropriate and cooperative throughout examination HEENT: No facial asymmetry,  EOMI, No sinus tenderness, TM's Clear, oropharynx  pink and moist.   Chest: Clear to auscultation bilaterally.  CVS: S1, S2, No murmurs, No S3.   Abd: Soft, Nontender.  MS: Adequate ROM spine, hips, shoulders and knees.  Ext: No edema.   CNS: CN 2-12 intact, power tone and sensation normal throughout.   Skin: Intact, no visible lesions or rashes.  Psych: Good eye contact, normal affect.  Memory intact, not anxious or depressed appearing. gU: uterus normal sized, no adnexal or cervical motion tenderness, white vaginal d/c   Impression & Recommendations:  Problem # 1:  ACUTE CYSTITIS (ICD-595.0) Assessment Comment Only  The following medications were removed from the medication list:    Penicillin V Potassium 500 Mg Tabs (Penicillin v potassium) .Marland Kitchen... Take 1 tablet by mouth three times a day Her updated medication list for this problem includes:    Ciprofloxacin Hcl 500 Mg Tabs (Ciprofloxacin hcl) .Marland Kitchen... Take 1 tablet by mouth two times a day  Orders: UA Dipstick W/ Micro (manual) ([address]) T-Culture, Urine ([id number])  Problem # 2:  VULVOVAGINITIS (ICD-616.10) Assessment: Comment Only  The following medications were removed from the medication list:    Penicillin V Potassium 500 Mg Tabs (Penicillin v potassium) .Marland Kitchen... Take 1 tablet by mouth three times a day Her updated medication list for this problem includes:    Ciprofloxacin Hcl 500 Mg Tabs (Ciprofloxacin hcl) .Marland Kitchen... Take  1 tablet by mouth two times a day  Orders: T-Wet Prep by Molecular Probe [id number]) T-Chlamydia & GC Probe, Genital ([id number])  Problem # 3:  OBESITY (ICD-278.00) Assessment: Improved  Ht: 66 (01/15/2010)   Wt: 228 (01/15/2010)   BMI: 36.93 (01/15/2010)  Complete Medication List: 1)  Phentermine Hcl 37.5 Mg Caps (Phentermine hcl) .... Take 1 tablet by mouth  once a day 2)  Phentermine Hcl 37.5 Mg Tabs (Phentermine hcl) .... Take 1 tablet by mouth once a day 3)  Zyrtec Hives Relief 10 Mg Tabs (Cetirizine hcl) .... Take 1 tablet by mouth once a day 4)  Ciprofloxacin Hcl 500 Mg Tabs (Ciprofloxacin hcl) .... Take 1 tablet by mouth two times a day  Patient Instructions: 1)  Please schedule a follow-up appointment in 2 months. 2)  It is important that you exercise regularly at least 45 minutes 6 times a week. If you develop chest pain, have severe difficulty breathing, or feel very tired , stop exercising immediately and seek medical attention. 3)  You need to lose weight. Consider a lower calorie diet and regular exercise.  Prescriptions: CIPROFLOXACIN HCL 500 MG TABS (CIPROFLOXACIN HCL) Take 1 tablet by mouth two times a day  #10 x 0   Entered by:   Everitt Amber LPN   Authorized by:   Syliva Overman MD   Signed by:   Everitt Amber LPN on 82/95/6213   Method used:   Electronically to        [facility] (retail)       [address]       [place], [place]  [address]       Ph: [phone]       Fax: [phone]   RxID:   [id number] ZYRTEC HIVES RELIEF 10 MG TABS (CETIRIZINE HCL) Take 1 tablet by mouth once a day  #30 x 4   Entered and Authorized by:   Syliva Overman MD   Signed by:   Syliva Overman MD on 01/15/2010   Method used:   Electronically to        [facility] (retail)       [address]       [place], [place]  [address]       Ph: [phone]       Fax: [phone]   RxID:   [id number] PHENTERMINE HCL 37.5 MG TABS (PHENTERMINE HCL) Take 1 tablet by mouth once a day  #30 x 1   Entered and Authorized by:   Syliva Overman MD   Signed by:   Syliva Overman MD on 01/15/2010   Method used:   Printed then faxed to ...       [facility] (retail)       [address]       [place], [place]  [address]       Ph: [phone]       Fax: [phone]   RxID:    [id number]   Laboratory Results   Urine Tests    Routine Urinalysis   Color: yellow Appearance: Clear Glucose: negative   (Normal Range: Negative) Bilirubin: negative   (Normal Range: Negative) Ketone: trace (5)   (Normal Range: Negative) Spec. Gravity: 1.020   (Normal Range: 1.003-1.035) Blood: negative   (Normal Range: Negative) pH: 6.5   (Normal Range: 5.0-8.0) Protein: negative   (Normal Range:  Negative) Urobilinogen: 0.2   (Normal Range: 0-1) Nitrite: negative   (Normal Range: Negative) Leukocyte Esterace: small   (Normal Range: Negative)

## 2010-12-05 NOTE — Progress Notes (Signed)
Summary: sick  Phone Note Call from Patient   Summary of Call: cough, sorethroat, and chills call back at [phone] [facility] Initial call taken by: Lind Guest,  October 14, 2010 8:13 AM  Follow-up for Phone Call        states some fever, advised needs ov, patient states she made an appt somewhere else Follow-up by: Adella Hare LPN,  October 14, 2010 9:18 AM

## 2010-12-23 ENCOUNTER — Encounter: Payer: Self-pay | Admitting: Family Medicine

## 2010-12-23 ENCOUNTER — Ambulatory Visit (INDEPENDENT_AMBULATORY_CARE_PROVIDER_SITE_OTHER): Payer: BC Managed Care – PPO | Admitting: Family Medicine

## 2010-12-23 DIAGNOSIS — H6690 Otitis media, unspecified, unspecified ear: Secondary | ICD-10-CM

## 2010-12-23 DIAGNOSIS — E669 Obesity, unspecified: Secondary | ICD-10-CM

## 2010-12-31 NOTE — Assessment & Plan Note (Signed)
Summary: office visit   Vital Signs:  Patient profile:   30 year old female Menstrual status:  irregular Height:      66 inches Weight:      225 pounds BMI:     36.45 O2 Sat:      97 % Pulse rate:   84 / minute Pulse rhythm:   regular Resp:     16 per minute BP sitting:   120 / 80  (left arm) Cuff size:   large  Vitals Entered By: Everitt Amber LPN (December 23, 2010 3:00 PM)  Nutrition Counseling: Patient's BMI is greater than 25 and therefore counseled on weight management options. CC: Follow up visit, having problems with left ear hurting and upper part of chest is sore.    Primary Care Provider:  Syliva Overman MD  CC:  Follow up visit and having problems with left ear hurting and upper part of chest is sore. Marland Kitchen  History of Present Illness: Reports  that she has been fairly well. She is concerned that she is not losing weight, burt has been off phentermine and not diligent with exercise Denies recent fever or chills. Increased , nasal congestion and left  ear pain nor sore throat. Denies chest congestion, or cough productive of sputum. Denies chest pain, palpitations, PND, orthopnea or leg swelling. Denies abdominal pain, nausea, vomitting, diarrhea or constipation. Denies change in bowel movements or bloody stool. Denies dysuria , frequency, incontinence or hesitancy. Denies  joint pain, swelling, or reduced mobility. Denies headaches, vertigo, seizures. Denies depression, anxiety or insomnia. Denies  rash, lesions, or itch.     Current Medications (verified): 1)  Zyrtec Hives Relief 10 Mg Tabs (Cetirizine Hcl) .... Take 1 Tablet By Mouth Once A Day 2)  Ibuprofen 800 Mg Tabs (Ibuprofen) .... Take 1 Three Times A Day As Needed For Back Pain 3)  Phentermine Hcl 37.5 Mg Caps (Phentermine Hcl) .... 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 Eyes:  Denies blurring, discharge, double vision, eye irritation, and eye pain. ENT:  Complains of earache; left earache with poopping x 1 week. Endo:  Denies cold intolerance, excessive hunger, excessive thirst, and excessive urination. Allergy:  Complains of seasonal allergies and sneezing; increased symptoms x 2 weeks, anticipated to worsen over Spring.  Physical Exam  General:  Well-developed,obese,in no acute distress; alert,appropriate and cooperative throughout examination HEENT: No facial asymmetry,  EOMI, No sinus tenderness,left TM erythematous,oropharynx  pink and moist.   Chest: Clear to auscultation bilaterally.  CVS: S1, S2, No murmurs, No S3.   Abd: Soft, Nontender.  MS: Adequate ROM spine, hips, shoulders and knees.  Ext: No edema.   CNS: CN 2-12 intact, power tone and sensation normal throughout.   Skin: Intact, no visible lesions or rashes.  Psych: Good eye contact, normal affect.  Memory intact, not anxious or depressed appearing.    Impression & Recommendations:  Problem # 1:  OTITIS MEDIA, LEFT (ICD-382.9) Assessment Comment Only  Her updated medication list for this problem includes:    Ibuprofen 800 Mg Tabs (Ibuprofen) .Marland Kitchen... Take 1 three times a day as needed for back pain    Azithromycin 250 Mg Tabs (Azithromycin) .Marland Kitchen..Marland Kitchen Two tablets n day 1, then  one table daily for an additional 4 days  Problem # 2:  ALLERGIC RHINITIS (ICD-477.9) Assessment: Deteriorated  Her updated medication list for this problem includes:    Zyrtec Hives Relief 10 Mg Tabs (Cetirizine hcl) .Marland Kitchen... Take 1 tablet by mouth once a day    Fluticasone Propionate 50 Mcg/act Susp (Fluticasone propionate) ..... One to two puffs per nostril once daily  Problem # 3:  OBESITY (ICD-278.00) Assessment: Deteriorated  Ht: 66 (12/23/2010)   Wt: 225 (12/23/2010)   BMI: 36.45  (12/23/2010) therapeutic lifestyle change discussed and encouraged pt to resume daily phentermine and to statrt regular exercise along with dietary modification, 1500 cal diet sheet given  Complete Medication List: 1)  Zyrtec Hives Relief 10 Mg Tabs (Cetirizine hcl) .... Take 1 tablet by mouth once a day 2)  Ibuprofen 800 Mg Tabs (Ibuprofen) .... Take 1 three times a day as needed for back pain 3)  Phentermine Hcl 37.5 Mg Tabs (Phentermine hcl) .... Take 1 tablet by mouth once a day 4)  Azithromycin 250 Mg Tabs (Azithromycin) .... Two tablets n day 1, then one table daily for an additional 4 days 5)  Fluconazole 150 Mg Tabs (Fluconazole) .... Take 1 tablet by mouth once a day as needed for vaginal itch 6)  Fluticasone Propionate 50 Mcg/act Susp (Fluticasone propionate) .... One to two puffs per nostril once daily  Patient Instructions: 1)  Please schedule a follow-up appointment in 3 months. 2)  It is important that you exercise regularly at least 20 minutes 5 times a week. If you develop chest pain, have severe difficulty breathing, or feel very tired , stop exercising immediately and seek medical attention. 3)  You need to lose weight. Consider a lower calorie diet and regular exercise.  4)  New meds for left ear infection, also for allergies 5)  Youwill get a 1500 cal diet sheet  Prescriptions: FLUTICASONE PROPIONATE 50 MCG/ACT SUSP (FLUTICASONE PROPIONATE) one to two puffs per nostril once daily  #1 x 3   Entered and Authorized by:   Syliva Overman MD   Signed by:   Syliva Overman MD on 12/23/2010   Method used:   Electronically to        [facility]* (retail)       [address]       [place], [place]  [address]       Ph: [phone]       Fax: [phone]   RxID:   [id number] FLUCONAZOLE 150 MG TABS (FLUCONAZOLE) Take 1 tablet by mouth once a day as needed for vaginal itch  #3 x 0   Entered and Authorized by:   Syliva Overman MD   Signed by:   Syliva Overman MD on 12/23/2010   Method used:   Electronically to        [facility]* (retail)       [address]       [place], [place]  [address]       Ph: [phone]       Fax: [phone]   RxID:   [id number] AZITHROMYCIN 250 MG TABS (AZITHROMYCIN) two tablets n day 1, then one table daily for an additional 4 days  #6 x 0   Entered and Authorized by:   Syliva Overman MD   Signed by:   Syliva Overman MD on 12/23/2010   Method used:   Electronically to  [facility] (retail)       [address]       [place], [place]  [address]       Ph: [phone]       Fax: [phone]   RxID:   [id number] PHENTERMINE HCL 37.5 MG TABS (PHENTERMINE HCL) Take 1 tablet by mouth once a day  #30 x 2   Entered and Authorized by:   Syliva Overman MD   Signed by:   Syliva Overman MD on 12/23/2010   Method used:   Printed then faxed to ...       [facility] (retail)       [address]       [place], [place]  [address]       Ph: [phone]       Fax: [phone]   RxID:   [id number]    Orders Added: 1)  Est. Patient Level IV [88416]

## 2011-02-05 LAB — URINE CULTURE: Colony Count: 2000

## 2011-02-05 LAB — URINALYSIS, ROUTINE W REFLEX MICROSCOPIC
Bilirubin Urine: NEGATIVE
Glucose, UA: NEGATIVE mg/dL
Leukocytes, UA: NEGATIVE
Nitrite: NEGATIVE
Protein, ur: NEGATIVE mg/dL
Specific Gravity, Urine: 1.025 (ref 1.005–1.030)
Urobilinogen, UA: 0.2 mg/dL (ref 0.0–1.0)
pH: 6 (ref 5.0–8.0)

## 2011-02-05 LAB — URINE MICROSCOPIC-ADD ON

## 2011-02-05 LAB — PREGNANCY, URINE: Preg Test, Ur: NEGATIVE

## 2011-03-18 NOTE — Op Note (Signed)
NAME:  Stacey Palmer, Stacey Palmer                  ACCOUNT NO.:  [id number]   MEDICAL RECORD NO.:  [id number]          PATIENT TYPE:  AMB   LOCATION:  DAY                           FACILITY:  [facility]   PHYSICIAN:  Vickki Hearing, M.D.DATE OF BIRTH:  07/26/1981   DATE OF PROCEDURE:  DATE OF DISCHARGE:                               OPERATIVE REPORT   PREOPERATIVE DIAGNOSES:  Mass left knee.   POSTOPERATIVE DIAGNOSES:  Mass left knee.   PROCEDURE:  Excision of left leg lipoma.   SURGEON:  Dr. Fuller Canada, no assistants.   ANESTHETIC:  General.   OPERATIVE FINDINGS:  Fatty tissue unencapsulated above the pes  insertion, no bony involvement.   SPECIMEN:  Went to pathology for identification.   A 30 year old female had pain over the medial aspect of her left leg  thought to be bursitis given injection, sent for MRI. MRI came back  fatty unencapsulated tissue.   The patient was having pain and it was decided to remove the lesion.   After identification of the patient in the preop holding area marking  the left leg as the surgical site and countersigning it, updating the  history and physical, she was taken to surgery, given antibiotics and  general anesthesia. A time-out procedure completed for left leg lipoma  excision, this was confirmed.   No tourniquet was used.   A straight incision was made over the mass. We went right down to the  bone and found no encapsulated tissue.  There was a large accumulation  of fatty tissue beneath the subcu layer which was excised in two pieces.   The wound was irrigated.  Hemostasis was a obtained with electrocautery.  Irrigation was done, the wound was closed in two layers with an adipose  to fascia suture and then subcu 3-0 Prolene and Steri-Strips.   The patient was then dressed sterilely, extubated and taken to the  recovery room in stable condition. The patient has a follow-up next  week. Vicodin for pain, limited activity over the  weekend.      Vickki Hearing, M.D.  Electronically Signed     SEH/MEDQ  D:  06/04/2007  T:  06/04/2007  Job:  [id number]

## 2011-03-18 NOTE — H&P (Signed)
NAME:  Stacey Palmer, Stacey Palmer                  ACCOUNT NO.:  [id number]   MEDICAL RECORD NO.:  [id number]          PATIENT TYPE:  AMB   LOCATION:  DAY                           FACILITY:  [facility]   PHYSICIAN:  Vickki Hearing, M.D.DATE OF BIRTH:  01-30-1981   DATE OF ADMISSION:  DATE OF DISCHARGE:  LH                              HISTORY & PHYSICAL   CHIEF COMPLAINT:  Mass left leg.   HISTORY:  This is a 30 year old female referred to me by Dr. Syliva Overman for evaluation of mass over the medial aspect of the left knee  which has been present for 2-3 years associated with intermittent  moderate pain and swelling.  She had a negative ultrasound.  MRI showed  an unencapsulated lipoma.  No neoplasm was seen.  Intra-articular  structures normal.   The patient would like to have this removed.  Primary indication for  removal--pain.   REVIEW OF SYSTEMS:  The patient's review of nine systems negative.  One  systems positive was depression.  She is not allergic to any medicines.  No surgery or previous medical problems.  She takes some vitamins.   FAMILY HISTORY:  Negative.   SOCIAL HISTORY:  No social habits, currently unemployed.  High school  education completed.   PHYSICAL EXAMINATION:  VITAL SIGNS:  She has a weight of 213, a pulse of  84, a respiratory rate of 18.  GENERAL:  Normal development, grooming, hygiene, and nutrition.  Body  habitus endomorphic.  NEUROLOGIC/PSYCHIATRIC:  Alert and oriented x3.  Mood normal, sensation  normal, coordination normal.  Reflexes not checked.  Lymphatics normal.  Peripheral vascular system normal pulse.  Negative venous for venous  stasis, normal temperature.  No edema.  SKIN:  Normal.  MUSCULOSKELETAL:  Gait and station normal.  EXTREMITIES:  Left knee range of motion normal, strength normal,  stability normal.  Inspection:  There is a tender mass 4 x 3 cm medial  knee firm, tender, and rubbery.  Negative joint line symptoms negative  meniscal  signs.  Negative effusion.  Opposite extremity normal.  Two  upper extremities show no evidence of malalignment contracture, atrophy  or subluxation.   DATA REVIEWED INCLUDE:  Plain films which showed no osteoarthritis.  No  fracture.  MRI shows mass medial side of the knee consistent with  unencapsulated lipoma.   DIAGNOSIS:  Lipoma left knee.   PLAN:  Excision of mass.  The patient understands that this is most  likely a lipoma.  We plan to excise it.  Significant and likely risks  are infection, recurrence, benefits,  loss of girth around the area and  decreased pain.  Alternatives include observation.  I answered the  patient's questions.   PLANNED PROCEDURE:  Excision of lipoma left knee near pes bursa.      Vickki Hearing, M.D.  Electronically Signed     SEH/MEDQ  D:  06/03/2007  T:  06/03/2007  Job:  [id number]   cc:   [facility]  Fax: [phone]

## 2011-03-25 ENCOUNTER — Telehealth: Payer: Self-pay | Admitting: Family Medicine

## 2011-03-26 MED ORDER — PHENTERMINE HCL 37.5 MG PO TABS: 37.5000 mg | ORAL_TABLET | Freq: Every day | ORAL | Status: DC

## 2011-03-26 MED ORDER — FLUTICASONE PROPIONATE 50 MCG/ACT NA SUSP: 1.0000 | Freq: Every day | NASAL | Status: DC

## 2011-03-26 NOTE — Telephone Encounter (Signed)
meds sent as requested, patient has appt next month

## 2011-04-22 ENCOUNTER — Encounter: Payer: Self-pay | Admitting: Family Medicine

## 2011-04-24 ENCOUNTER — Ambulatory Visit (INDEPENDENT_AMBULATORY_CARE_PROVIDER_SITE_OTHER): Payer: BC Managed Care – PPO | Admitting: Family Medicine

## 2011-04-24 ENCOUNTER — Encounter: Payer: Self-pay | Admitting: Family Medicine

## 2011-04-24 VITALS — BP 120/80 | HR 73 | Resp 16 | Ht 66.0 in | Wt 240.0 lb

## 2011-04-24 DIAGNOSIS — Z139 Encounter for screening, unspecified: Secondary | ICD-10-CM

## 2011-04-24 DIAGNOSIS — H6690 Otitis media, unspecified, unspecified ear: Secondary | ICD-10-CM

## 2011-04-24 DIAGNOSIS — E669 Obesity, unspecified: Secondary | ICD-10-CM

## 2011-04-24 DIAGNOSIS — J019 Acute sinusitis, unspecified: Secondary | ICD-10-CM

## 2011-04-24 DIAGNOSIS — Z13220 Encounter for screening for lipoid disorders: Secondary | ICD-10-CM

## 2011-04-24 DIAGNOSIS — R5381 Other malaise: Secondary | ICD-10-CM

## 2011-04-24 DIAGNOSIS — H6691 Otitis media, unspecified, right ear: Secondary | ICD-10-CM

## 2011-04-24 DIAGNOSIS — R5383 Other fatigue: Secondary | ICD-10-CM

## 2011-04-24 MED ORDER — FLUCONAZOLE 100 MG PO TABS: ORAL_TABLET | ORAL | Status: AC

## 2011-04-24 MED ORDER — PHENTERMINE HCL 37.5 MG PO TABS: 37.5000 mg | ORAL_TABLET | Freq: Every day | ORAL | Status: DC

## 2011-04-24 MED ORDER — SULFAMETHOXAZOLE-TRIMETHOPRIM 800-160 MG PO TABS: 1.0000 | ORAL_TABLET | Freq: Two times a day (BID) | ORAL | Status: AC

## 2011-04-24 NOTE — Patient Instructions (Addendum)
CPE in 2 months  Start phentermine one daily and follow new eating guidelines we discusse, keep a food diary , and do not eat more than 1500 caloriesdaily pls. Drink 64 ounces water daily.   It is important that you exercise regularly at least 30 minutes 5 times a week. If you develop chest pain, have severe difficulty breathing, or feel very tired, stop exercising immediately and seek medical attention    A healthy diet is rich in fruit, vegetables and whole grains. Poultry fish, nuts and beans are a healthy choice for protein rather then red meat. A low sodium diet and drinking 64 ounces of water daily is generally recommended. Oils and sweet should be limited. Carbohydrates especially for those who are diabetic or overweight, should be limited to 34-45 gram per meal. It is important to eat on a regular schedule, at least 3 times daily. Snacks should be primarily fruits, vegetables or nuts.   You are being treated for sinusitis and right  otitis media, med is sent to your pharmacy.

## 2011-04-24 NOTE — Assessment & Plan Note (Signed)
Deteriorated. Patient re-educated about  the importance of commitment to a  minimum of 150 minutes of exercise per week. The importance of healthy food choices with portion control discussed. Encouraged to start a food diary, count calories and to consider  joining a support group. Sample diet sheets offered. Goals set by the patient for the next several months.    

## 2011-04-24 NOTE — Progress Notes (Signed)
  Subjective:    Patient ID: Stacey Palmer, female    DOB: [birth year], 30 y.o.   MRN: [id number]  HPI 2 week h/o sinus pressure, drainage ,some yellow, , right ear pain, sore thraot and cough productive of thick mucus.   She has had intermittent chills, but no documented fever. She denies sore throat or productive cough. She is extremely concerned about weight gain, and wants to resume an appetite supresant to help with this. She is also comiting to nutrtional counseling as well as regular exercise to facilitate weight loss    Review of Systems Denies recent fever or chills.  Denies chest congestion, productive cough or wheezing. Denies chest pains, palpitations, paroxysmal nocturnal dyspnea, orthopnea and leg swelling Denies abdominal pain, nausea, vomiting,diarrhea or constipation.  Denies rectal bleeding or change in bowel movement. Denies dysuria, frequency, hesitancy or incontinence. Denies joint pain, swelling and limitation in mobility. Denies headaches, seizure, numbness, or tingling. Denies depression, anxiety or insomnia. Denies skin break down or rash.        Objective:   Physical Exam Patient alert and oriented and in no Cardiopulmonary distress.  HEENT: No facial asymmetry, EOMI,maxillary sinus tenderness, left TM erythematous, Oropharynx pink and moist.  Neck supple anterior cervical  adenopathy.  Chest: Clear to auscultation bilaterally.  CVS: S1, S2 no murmurs, no S3.  ABD: Soft non tender. Bowel sounds normal.  Ext: No edema  MS: Adequate ROM spine, shoulders, hips and knees.  Skin: Intact, no ulcerations or rash noted.  Psych: Good eye contact, normal affect. Memory intact not anxious or depressed appearing.  CNS: CN 2-12 intact, power, tone and sensation normal throughout.        Assessment & Plan:

## 2011-04-25 LAB — HSV 1 ANTIBODY, IGG: HSV 1 Glycoprotein G Ab, IgG: <0.1 IV

## 2011-04-25 LAB — HEMOGLOBIN A1C: Mean Plasma Glucose: 111 mg/dL (ref <117)

## 2011-05-06 DIAGNOSIS — H6691 Otitis media, unspecified, right ear: Secondary | ICD-10-CM | POA: Insufficient documentation

## 2011-05-06 NOTE — Assessment & Plan Note (Signed)
Acute sinus infection, medication prescribed

## 2011-05-06 NOTE — Assessment & Plan Note (Signed)
Acute infection , antibiotic prescribed 

## 2011-05-06 NOTE — Assessment & Plan Note (Signed)
Acute infection antibiotic  med sent to the pharmacy

## 2011-05-27 ENCOUNTER — Other Ambulatory Visit: Payer: Self-pay | Admitting: Family Medicine

## 2011-07-09 ENCOUNTER — Encounter: Payer: Self-pay | Admitting: Family Medicine

## 2011-07-10 ENCOUNTER — Encounter: Payer: Self-pay | Admitting: Family Medicine

## 2011-07-10 ENCOUNTER — Ambulatory Visit: Payer: BC Managed Care – PPO | Admitting: Family Medicine

## 2011-07-11 ENCOUNTER — Encounter: Payer: Self-pay | Admitting: Family Medicine

## 2011-07-14 ENCOUNTER — Encounter: Payer: BC Managed Care – PPO | Admitting: Family Medicine

## 2011-07-15 ENCOUNTER — Encounter: Payer: Self-pay | Admitting: Family Medicine

## 2011-07-22 ENCOUNTER — Encounter: Payer: Self-pay | Admitting: Family Medicine

## 2011-07-23 ENCOUNTER — Ambulatory Visit: Payer: BC Managed Care – PPO | Admitting: Family Medicine

## 2011-07-23 ENCOUNTER — Telehealth: Payer: Self-pay | Admitting: Family Medicine

## 2011-07-24 NOTE — Telephone Encounter (Signed)
Printed and put up front for patient. Called and left message

## 2011-08-18 LAB — HEMOGLOBIN AND HEMATOCRIT, BLOOD
HCT: 36.8
Hemoglobin: 12.8

## 2011-09-05 ENCOUNTER — Encounter: Payer: Self-pay | Admitting: Family Medicine

## 2011-09-09 ENCOUNTER — Encounter: Payer: Self-pay | Admitting: Family Medicine

## 2011-09-09 ENCOUNTER — Encounter: Payer: BC Managed Care – PPO | Admitting: Family Medicine

## 2011-09-10 ENCOUNTER — Encounter: Payer: Self-pay | Admitting: Family Medicine

## 2011-09-11 ENCOUNTER — Ambulatory Visit: Payer: BC Managed Care – PPO | Admitting: Family Medicine

## 2011-09-17 ENCOUNTER — Ambulatory Visit: Payer: BC Managed Care – PPO

## 2011-10-01 ENCOUNTER — Encounter: Payer: Self-pay | Admitting: Family Medicine

## 2011-10-06 ENCOUNTER — Encounter: Payer: Self-pay | Admitting: Family Medicine

## 2011-10-06 ENCOUNTER — Ambulatory Visit (INDEPENDENT_AMBULATORY_CARE_PROVIDER_SITE_OTHER): Payer: BC Managed Care – PPO | Admitting: Family Medicine

## 2011-10-06 VITALS — BP 116/70 | HR 80 | Resp 16 | Ht 66.0 in | Wt 243.1 lb

## 2011-10-06 DIAGNOSIS — J019 Acute sinusitis, unspecified: Secondary | ICD-10-CM

## 2011-10-06 DIAGNOSIS — N390 Urinary tract infection, site not specified: Secondary | ICD-10-CM

## 2011-10-06 DIAGNOSIS — Z23 Encounter for immunization: Secondary | ICD-10-CM

## 2011-10-06 DIAGNOSIS — H6690 Otitis media, unspecified, unspecified ear: Secondary | ICD-10-CM

## 2011-10-06 DIAGNOSIS — E669 Obesity, unspecified: Secondary | ICD-10-CM

## 2011-10-06 DIAGNOSIS — H6691 Otitis media, unspecified, right ear: Secondary | ICD-10-CM

## 2011-10-06 DIAGNOSIS — R5381 Other malaise: Secondary | ICD-10-CM

## 2011-10-06 DIAGNOSIS — N3000 Acute cystitis without hematuria: Secondary | ICD-10-CM

## 2011-10-06 DIAGNOSIS — R5383 Other fatigue: Secondary | ICD-10-CM

## 2011-10-06 DIAGNOSIS — J309 Allergic rhinitis, unspecified: Secondary | ICD-10-CM

## 2011-10-06 MED ORDER — PHENTERMINE HCL 37.5 MG PO TABS: 37.5000 mg | ORAL_TABLET | Freq: Every day | ORAL | Status: DC

## 2011-10-06 MED ORDER — SULFAMETHOXAZOLE-TRIMETHOPRIM 800-160 MG PO TABS: 1.0000 | ORAL_TABLET | Freq: Two times a day (BID) | ORAL | Status: AC

## 2011-10-06 MED ORDER — FLUCONAZOLE 150 MG PO TABS: ORAL_TABLET | ORAL | Status: AC

## 2011-10-06 NOTE — Progress Notes (Signed)
  Subjective:    Patient ID: Stacey Palmer, female    DOB: 02/27/1981, 30 y.o.   MRN: [id number]  HPI Pt in today stating that since last week tuesday 09/30/2011 she had acute diarreah, with nausea, also reports excessive burping "like a rotten egg", with lower abdominal cramping pain. States yesterday she had 3 bowel movements, now firm, max # seems to be 4 times per day. Reports fever and chills , her daughter also was sick. Better today and working C/o right ear pain, and urinary frequency with mild dysuria in the past 5 days, denies fever , chills or flank pain   Review of Systems See HPI Denies recent fever or chills. Denies sinus pressure, nasal congestion,  Denies chest congestion, productive cough or wheezing. Denies chest pains, palpitations and leg swelling Denies abdominal pain, nausea, vomiting,diarrhea or constipation.    Denies joint pain, swelling and limitation in mobility. Denies headaches, seizures, numbness, or tingling. Denies depression, anxiety or insomnia. Denies skin break down or rash.        Objective:   Physical Exam Patient alert and oriented and in no cardiopulmonary distress.  HEENT: No facial asymmetry, EOMI, no sinus tenderness,  oropharynx pink and moist.  Neck supple no adenopathy.Right tM red with poor light reflex  Chest: Clear to auscultation bilaterally.  CVS: S1, S2 no murmurs, no S3.  ABD: Soft non tender. Bowel sounds normal.no renal angle tendeness  Ext: No edema  MS: Adequate ROM spine, shoulders, hips and knees.  Skin: Intact, no ulcerations or rash noted.  Psych: Good eye contact, normal affect. Memory intact not anxious or depressed appearing.  CNS: CN 2-12 intact, power, tone and sensation normal throughout.        Assessment & Plan:

## 2011-10-06 NOTE — Patient Instructions (Addendum)
CPE in 7 weeks.  Medication is sent in for ear infection, which will also treat a UTI, however urine test is normal  You need to follow the eating plan discussed, and start phentermine one daily.  It is important that you exercise regularly at least 30 minutes 5 times a week. If you develop chest pain, have severe difficulty breathing, or feel very tired, stop exercising immediately and seek medical attention    Fasting labs just before next visit.  Weight loss goal of 4 to 6 pounds per month.  TdaP and Flu vaccines today

## 2011-10-12 NOTE — Assessment & Plan Note (Signed)
Antibiotic prescribed 

## 2011-10-12 NOTE — Assessment & Plan Note (Signed)
Deteriorated. Patient re-educated about  the importance of commitment to a  minimum of 150 minutes of exercise per week. The importance of healthy food choices with portion control discussed. Encouraged to start a food diary, count calories and to consider  joining a support group. Sample diet sheets offered. Goals set by the patient for the next several months.    

## 2011-10-12 NOTE — Assessment & Plan Note (Signed)
Symptomatic mildly, however UA is negative, pt reassured

## 2011-10-12 NOTE — Assessment & Plan Note (Signed)
Uncontrolled with increased symptoms, medication prescribed

## 2011-10-21 ENCOUNTER — Encounter (HOSPITAL_COMMUNITY): Payer: Self-pay | Admitting: Dietician

## 2011-10-21 NOTE — Progress Notes (Signed)
Outpatient Nutrition Progress Note Date: 10/21/11 Time: 4:53 PM  Pt was a no-show for appointment scheduled for 10/21/11 at 4:00 PM. Pt has rescheduled 4 appointments since last visit on 07/21/11.  Melody Haver, RD, LDN Date: 10/21/11 Time: 4:53 PM

## 2011-10-23 ENCOUNTER — Telehealth: Payer: Self-pay

## 2011-10-23 NOTE — Telephone Encounter (Signed)
STATES SHE THINKS SHE HAS A UTI. ADVISED URGENT CARE BUT SHE IS ALREADY ON SEPTRA. TOLD HER TO FINISH THE MED AND IF STILL HAVING SYMPTOMS TO GO TO THE UC TO BE EVALUATED.

## 2011-11-21 ENCOUNTER — Encounter: Payer: Self-pay | Admitting: Family Medicine

## 2011-11-25 ENCOUNTER — Encounter: Payer: BC Managed Care – PPO | Admitting: Family Medicine

## 2011-12-23 ENCOUNTER — Encounter: Payer: BC Managed Care – PPO | Admitting: Family Medicine

## 2012-01-06 ENCOUNTER — Encounter: Payer: BC Managed Care – PPO | Admitting: Family Medicine

## 2012-01-06 ENCOUNTER — Encounter: Payer: Self-pay | Admitting: Family Medicine

## 2012-01-21 ENCOUNTER — Ambulatory Visit: Payer: BC Managed Care – PPO | Admitting: Family Medicine

## 2012-01-22 ENCOUNTER — Ambulatory Visit (INDEPENDENT_AMBULATORY_CARE_PROVIDER_SITE_OTHER): Payer: BC Managed Care – PPO | Admitting: Family Medicine

## 2012-01-22 ENCOUNTER — Encounter: Payer: Self-pay | Admitting: Family Medicine

## 2012-01-22 VITALS — BP 118/68 | HR 90 | Resp 18 | Ht 66.0 in | Wt 240.0 lb

## 2012-01-22 DIAGNOSIS — R5381 Other malaise: Secondary | ICD-10-CM

## 2012-01-22 DIAGNOSIS — J209 Acute bronchitis, unspecified: Secondary | ICD-10-CM

## 2012-01-22 DIAGNOSIS — Z0000 Encounter for general adult medical examination without abnormal findings: Secondary | ICD-10-CM

## 2012-01-22 DIAGNOSIS — R5383 Other fatigue: Secondary | ICD-10-CM

## 2012-01-22 DIAGNOSIS — J309 Allergic rhinitis, unspecified: Secondary | ICD-10-CM

## 2012-01-22 DIAGNOSIS — J019 Acute sinusitis, unspecified: Secondary | ICD-10-CM

## 2012-01-22 DIAGNOSIS — F439 Reaction to severe stress, unspecified: Secondary | ICD-10-CM

## 2012-01-22 DIAGNOSIS — Z639 Problem related to primary support group, unspecified: Secondary | ICD-10-CM

## 2012-01-22 DIAGNOSIS — E669 Obesity, unspecified: Secondary | ICD-10-CM

## 2012-01-22 MED ORDER — BENZONATATE 100 MG PO CAPS: 100.0000 mg | ORAL_CAPSULE | Freq: Four times a day (QID) | ORAL | Status: DC | PRN

## 2012-01-22 MED ORDER — FLUTICASONE PROPIONATE 50 MCG/ACT NA SUSP: 1.0000 | Freq: Every day | NASAL | Status: DC

## 2012-01-22 MED ORDER — PENICILLIN V POTASSIUM 500 MG PO TABS: 500.0000 mg | ORAL_TABLET | Freq: Three times a day (TID) | ORAL | Status: AC

## 2012-01-22 NOTE — Progress Notes (Signed)
  Subjective:    Patient ID: Stacey Palmer, female    DOB: 07/01/1981, 31 y.o.   MRN: [id number]  HPI The PT is here for follow up and re-evaluation of chronic medical conditions, medication management and review of any available recent lab and radiology data.  Preventive health is updated, specifically  Cancer screening and Immunization.   Questions or concerns regarding consultations or procedures which the PT has had in the interim are  Addressed.Recently diagnosed with genital herpes, and emotionally dealing wth this as well as the fact that her  Partner moved out and consistently lies per her reporting. The PT denies any adverse reactions to current medications since the last visit.  Concerned about weight , intends to work on this . 5 day h/o increased head and chest congestion, pressure with yellow drainage and cough productive of sputum    Review of Systems See HPI Denies chest pains, palpitations and leg swelling Denies abdominal pain, nausea, vomiting,diarrhea or constipation.   Denies dysuria, frequency, hesitancy or incontinence. Denies joint pain, swelling and limitation in mobility. Denies headaches, seizures, numbness, or tingling.  Denies skin break down or rash.        Objective:   Physical Exam Patient alert and oriented and in no cardiopulmonary distress.  HEENT: No facial asymmetry, EOMI, left maxillary  sinus tenderness,  oropharynx pink and moist.  Neck supple no adenopathy.  Chest: decreased air entry, scattered crackles, no wheezes  CVS: S1, S2 no murmurs, no S3.  ABD: Soft non tender. Bowel sounds normal.  Ext: No edema  MS: Adequate ROM spine, shoulders, hips and knees.  Skin: Intact, no ulcerations or rash noted.  Psych: Good eye contact, normal affect. Memory intact not anxious or depressed appearing.  CNS: CN 2-12 intact, power, tone and sensation normal throughout.        Assessment & Plan:

## 2012-01-22 NOTE — Patient Instructions (Signed)
F/u in 4.5 month  You are treated for sinusitis and bronchitis, medication is ssent in.  Flonase will be refilled for allergies.  It is important that you exercise regularly at least 30 minutes 5 times a week. If you develop chest pain, have severe difficulty breathing, or feel very tired, stop exercising immediately and seek medical attention   A healthy diet is rich in fruit, vegetables and whole grains. Poultry fish, nuts and beans are a healthy choice for protein rather then red meat. A low sodium diet and drinking 64 ounces of water daily is generally recommended. Oils and sweet should be limited. Carbohydrates especially for those who are diabetic or overweight, should be limited to 34-45 gram per meal. It is important to eat on a regular schedule, at least 3 times daily. Snacks should be primarily fruits, vegetables or nuts.   Weight loss goal of 3 to 4 pound per month

## 2012-01-24 DIAGNOSIS — F439 Reaction to severe stress, unspecified: Secondary | ICD-10-CM | POA: Insufficient documentation

## 2012-01-24 NOTE — Assessment & Plan Note (Signed)
Pt to start regular medication

## 2012-01-24 NOTE — Assessment & Plan Note (Signed)
Relationship issues in the past 4 months, now living on her own , and recently diagnosed with genital herpes, trying to work through this. Refusing counseling at this time, but not suicidal or homicidal

## 2012-01-24 NOTE — Assessment & Plan Note (Signed)
Deteriorated. Patient re-educated about  the importance of commitment to a  minimum of 150 minutes of exercise per week. The importance of healthy food choices with portion control discussed. Encouraged to start a food diary, count calories and to consider  joining a support group. Sample diet sheets offered. Goals set by the patient for the next several months.    

## 2012-01-24 NOTE — Assessment & Plan Note (Signed)
Acute onset, decongestants and antibiotics prescribed

## 2012-01-24 NOTE — Assessment & Plan Note (Signed)
Antibiotic course prescribed 

## 2012-02-02 ENCOUNTER — Ambulatory Visit: Payer: BC Managed Care – PPO | Admitting: Family Medicine

## 2012-02-03 ENCOUNTER — Ambulatory Visit (INDEPENDENT_AMBULATORY_CARE_PROVIDER_SITE_OTHER): Payer: BC Managed Care – PPO | Admitting: Family Medicine

## 2012-02-03 ENCOUNTER — Encounter: Payer: Self-pay | Admitting: Family Medicine

## 2012-02-03 VITALS — BP 120/84 | HR 60 | Resp 16 | Ht 66.0 in | Wt 238.4 lb

## 2012-02-03 DIAGNOSIS — J209 Acute bronchitis, unspecified: Secondary | ICD-10-CM

## 2012-02-03 DIAGNOSIS — J019 Acute sinusitis, unspecified: Secondary | ICD-10-CM

## 2012-02-03 MED ORDER — CEFTRIAXONE SODIUM 1 G IJ SOLR
500.0000 mg | Freq: Once | INTRAMUSCULAR | Status: AC
  Administered 2012-02-03: 500 mg via INTRAMUSCULAR

## 2012-02-03 MED ORDER — SULFAMETHOXAZOLE-TRIMETHOPRIM 800-160 MG PO TABS: 1.0000 | ORAL_TABLET | Freq: Two times a day (BID) | ORAL | Status: AC

## 2012-02-03 MED ORDER — METHYLPREDNISOLONE ACETATE 80 MG/ML IJ SUSP
80.0000 mg | Freq: Once | INTRAMUSCULAR | Status: AC
  Administered 2012-02-03: 80 mg via INTRAMUSCULAR

## 2012-02-03 MED ORDER — FLUCONAZOLE 150 MG PO TABS: ORAL_TABLET | ORAL | Status: DC

## 2012-02-03 NOTE — Patient Instructions (Addendum)
F/u as before   You are being treated for sinusitis and brionchitis.  Depomedrol and rocephin are administered in the office.  Medication is sent to your pharmacy

## 2012-02-08 NOTE — Assessment & Plan Note (Signed)
Antibiotic prescribed 

## 2012-02-08 NOTE — Assessment & Plan Note (Signed)
Antibiotic administered and prescribed, also depo medrol

## 2012-02-08 NOTE — Progress Notes (Signed)
  Subjective:    Patient ID: Stacey Palmer, female    DOB: 07/23/1981, 31 y.o.   MRN: [id number]  HPI  1 week h/o increased head and chest congestion, thick nasal drainage and cough productive of yellow sputum, no fever but chills  Review of Systems See HPI Denies chest pains, palpitations and leg swelling Denies abdominal pain, nausea, vomiting,diarrhea or constipation.   Denies dysuria, frequency, hesitancy or incontinence. Denies joint pain, swelling and limitation in mobility. Denies seizures, numbness, or tingling.Has frontal headache Denies depression, anxiety or insomnia. Denies skin break down or rash.        Objective:   Physical Exam  Patient alert and oriented and in no cardiopulmonary distress.Ill appearing  HEENT: No facial asymmetry, EOMI, frontal and maxillary  sinus tenderness,  oropharynx pink and moist.  Neck supple anterior adenopathy.  Chest: decreased though adequate air entry, scattered crackles, no wheezes  CVS: S1, S2 no murmurs, no S3.  ABD: Soft non tender. Bowel sounds normal.  Ext: No edema       Assessment & Plan:

## 2012-06-23 ENCOUNTER — Ambulatory Visit: Payer: BC Managed Care – PPO | Admitting: Family Medicine

## 2012-07-06 ENCOUNTER — Ambulatory Visit: Payer: BC Managed Care – PPO | Admitting: Family Medicine

## 2012-07-20 ENCOUNTER — Encounter: Payer: Self-pay | Admitting: Family Medicine

## 2012-07-20 ENCOUNTER — Ambulatory Visit: Payer: BC Managed Care – PPO | Admitting: Family Medicine

## 2012-08-23 ENCOUNTER — Ambulatory Visit: Payer: BC Managed Care – PPO

## 2012-08-23 ENCOUNTER — Ambulatory Visit (INDEPENDENT_AMBULATORY_CARE_PROVIDER_SITE_OTHER): Payer: BC Managed Care – PPO | Admitting: Family Medicine

## 2012-08-23 ENCOUNTER — Encounter: Payer: Self-pay | Admitting: Family Medicine

## 2012-08-23 ENCOUNTER — Ambulatory Visit: Payer: BC Managed Care – PPO | Admitting: Family Medicine

## 2012-08-23 VITALS — BP 132/64 | HR 76 | Temp 98.5°F | Resp 18 | Ht 66.0 in | Wt 236.1 lb

## 2012-08-23 DIAGNOSIS — J069 Acute upper respiratory infection, unspecified: Secondary | ICD-10-CM

## 2012-08-23 DIAGNOSIS — J019 Acute sinusitis, unspecified: Secondary | ICD-10-CM

## 2012-08-23 HISTORY — DX: Acute upper respiratory infection, unspecified: J06.9

## 2012-08-23 MED ORDER — AMOXICILLIN 500 MG PO CAPS: 500.0000 mg | ORAL_CAPSULE | Freq: Two times a day (BID) | ORAL | Status: AC

## 2012-08-23 MED ORDER — FLUCONAZOLE 150 MG PO TABS: ORAL_TABLET | ORAL | Status: AC

## 2012-08-23 MED ORDER — FLUTICASONE PROPIONATE 50 MCG/ACT NA SUSP: 1.0000 | Freq: Every day | NASAL | Status: DC

## 2012-08-23 NOTE — Assessment & Plan Note (Signed)
History of recurrent infections, non smoker, antibiotics per above for sinus, Restart flonase

## 2012-08-23 NOTE — Assessment & Plan Note (Signed)
Unable to get meds until later this week, will give IM depo medrol and IM rocephin

## 2012-08-23 NOTE — Patient Instructions (Signed)
Start antibiotics tomorrow Use Nasal spray for the allergies and nasal drip Steroid shot and antibiotic shot given  Schedule for flu shot- call on a Wed

## 2012-08-23 NOTE — Progress Notes (Signed)
  Subjective:    Patient ID: Stacey Palmer, female    DOB: 1981-08-15, 31 y.o.   MRN: [id number]  HPI Patient presents with cough and nasal congestion and drainage for the past 5 days. She's a history recurrent sinusitis and bronchitis. She is a nonsmoker. She has seasonal allergies. She's tried over-the-counter allergy medication which has not helped. She is a sick contact with her son who is also sick with a viral illness. She states today she began feeling short of breath especially with coughing episodes.    Review of Systems - per above   GEN- + fatigue, fever, weight loss,weakness, recent illness HEENT- denies eye drainage, change in vision, +nasal discharge, CVS- denies chest pain, palpitations RESP- + SOB, +cough, wheeze ABD- denies N/V, change in stools, abd pain Neuro- denies headache, dizziness, syncope, seizure activity      Objective:   Physical Exam GEN- NAD, alert and oriented x3 HEENT- PERRL, EOMI, non injected sclera, pink conjunctiva, MMM, oropharynx mild injection, nares clear rhinorrhea, + maxillary sinus tenderness, TM clear no effusion, canal clear, hoarse voice Neck- Supple, no LAD CVS- RRR, no murmur RESP-CTAB EXT- No edema Pulses- Radial 2+        Assessment & Plan:

## 2012-08-24 MED ORDER — METHYLPREDNISOLONE ACETATE 40 MG/ML IJ SUSP
40.0000 mg | Freq: Once | INTRAMUSCULAR | Status: AC
  Administered 2012-08-23: 40 mg via INTRAMUSCULAR

## 2012-08-24 MED ORDER — CEFTRIAXONE SODIUM 1 G IJ SOLR
500.0000 mg | Freq: Once | INTRAMUSCULAR | Status: AC
  Administered 2012-08-23: 500 mg via INTRAMUSCULAR

## 2012-08-24 NOTE — Addendum Note (Signed)
Addended by: Kandis Fantasia B on: 08/24/2012 08:46 AM   Modules accepted: Orders

## 2012-09-01 ENCOUNTER — Ambulatory Visit (INDEPENDENT_AMBULATORY_CARE_PROVIDER_SITE_OTHER): Payer: BC Managed Care – PPO

## 2012-09-01 DIAGNOSIS — Z23 Encounter for immunization: Secondary | ICD-10-CM

## 2012-10-04 ENCOUNTER — Telehealth: Payer: Self-pay | Admitting: Family Medicine

## 2012-10-04 MED ORDER — PROMETHAZINE HCL 12.5 MG PO TABS: 12.5000 mg | ORAL_TABLET | Freq: Four times a day (QID) | ORAL | Status: DC | PRN

## 2012-10-04 NOTE — Telephone Encounter (Signed)
Advised pt of brat diet and that med was sent in

## 2012-10-04 NOTE — Telephone Encounter (Signed)
pls send in phenergan 12.5mg  one 3 times daily, as needed #20 no refill, let her know and also review the [place] diet with her Review importance  of hand washing to prevent spread

## 2012-10-04 NOTE — Telephone Encounter (Signed)
Tried calling pt back- no answer. Can something be sent in?

## 2013-04-05 ENCOUNTER — Ambulatory Visit (INDEPENDENT_AMBULATORY_CARE_PROVIDER_SITE_OTHER): Payer: BC Managed Care – PPO | Admitting: Family Medicine

## 2013-04-05 ENCOUNTER — Telehealth (HOSPITAL_COMMUNITY): Payer: Self-pay | Admitting: Dietician

## 2013-04-05 ENCOUNTER — Encounter: Payer: Self-pay | Admitting: Family Medicine

## 2013-04-05 VITALS — BP 130/80 | HR 60 | Resp 16 | Ht 66.0 in | Wt 242.0 lb

## 2013-04-05 DIAGNOSIS — Z13220 Encounter for screening for lipoid disorders: Secondary | ICD-10-CM

## 2013-04-05 DIAGNOSIS — J309 Allergic rhinitis, unspecified: Secondary | ICD-10-CM

## 2013-04-05 DIAGNOSIS — E669 Obesity, unspecified: Secondary | ICD-10-CM

## 2013-04-05 DIAGNOSIS — R5383 Other fatigue: Secondary | ICD-10-CM

## 2013-04-05 DIAGNOSIS — R5381 Other malaise: Secondary | ICD-10-CM

## 2013-04-05 MED ORDER — LORATADINE 10 MG PO TABS: 10.0000 mg | ORAL_TABLET | Freq: Every day | ORAL | Status: DC

## 2013-04-05 MED ORDER — PHENTERMINE HCL 37.5 MG PO TABS: 37.5000 mg | ORAL_TABLET | Freq: Every day | ORAL | Status: DC

## 2013-04-05 NOTE — Assessment & Plan Note (Signed)
Worsened, due to weight gain and lack of exercise

## 2013-04-05 NOTE — Assessment & Plan Note (Signed)
Deteriorated. Patient re-educated about  the importance of commitment to a  minimum of 150 minutes of exercise per week. The importance of healthy food choices with portion control discussed. Encouraged to start a food diary, count calories and to consider  joining a support group. Sample diet sheets offered. Goals set by the patient for the next several months.    

## 2013-04-05 NOTE — Telephone Encounter (Signed)
Received referral via fax from Dr. Lodema Hong for dx: obesity. Pt's last appt was on 07/11/11. Noted that last scheduled appointment was 10/21/11, for which pt was a no-show, and pt rescheduled 4 appointments prior to that.

## 2013-04-05 NOTE — Telephone Encounter (Signed)
Left message on pt voicemail at [phone].

## 2013-04-05 NOTE — Assessment & Plan Note (Signed)
Uncontrolled, start daily flonase and claritin

## 2013-04-05 NOTE — Progress Notes (Signed)
  Subjective:    Patient ID: Stacey Palmer, female    DOB: 02-03-1981, 32 y.o.   MRN: [id number]  HPI The PT is here for follow up and re-evaluation of chronic medical conditions, medication management and review of any available recent lab and radiology data.  Preventive health is updated, specifically  Cancer screening and Immunization.   Questions or concerns regarding consultations or procedures which the PT has had in the interim are  addressed. The PT denies any adverse reactions to current medications since the last visit.  2 week h/o increased nasal congestion with clear drainage, no fever, chills, sore throat or cough Continues to gain weight, wants to look into surgical option in the interim will work aggressively on lifestyle change   Review of Systems See HPI Denies recent fever or chills. Denies chest congestion, productive cough or wheezing. Denies chest pains, palpitations and leg swelling Denies abdominal pain, nausea, vomiting,diarrhea or constipation.   Denies dysuria, frequency, hesitancy or incontinence. Denies joint pain, swelling and limitation in mobility. Denies headaches, seizures, numbness, or tingling. Denies depression, anxiety or insomnia. Denies skin break down or rash.        Objective:   Physical Exam  Patient alert and oriented and in no cardiopulmonary distress.  HEENT: No facial asymmetry, EOMI, no sinus tenderness,  oropharynx pink and moist.  Neck supple no adenopathy.  Chest: Clear to auscultation bilaterally.  CVS: S1, S2 no murmurs, no S3.  ABD: Soft non tender. Bowel sounds normal.  Ext: No edema  MS: Adequate ROM spine, shoulders, hips and knees.  Skin: Intact, no ulcerations or rash noted.  Psych: Good eye contact, normal affect. Memory intact not anxious or depressed appearing.  CNS: CN 2-12 intact, power, tone and sensation normal throughout.       Assessment & Plan:

## 2013-04-05 NOTE — Patient Instructions (Addendum)
F/u in 2 months   Fasting lipid, chem 7 , CBC, TSH, hBA1C in am  You will be referred for individual counseling on eating right to lose weight, plase expect a call and keep appt  You will get a 1500 calorie sheet  Start phentermine HALF tablet once daily, script will say one daily.Medication will NOT be refilled unless you come top appt.  It is important that you exercise regularly at least 30 minutes 7 times a week. If you develop chest pain, have severe difficulty breathing, or feel very tired, stop exercising immediately and seek medical attention   For allergies, use flonase and take loratidine 1 tablet every day.  ALL the best!  You will get info to call regarding weight loss surgery conferences

## 2013-04-06 LAB — CBC
HCT: 39.4 % (ref 36.0–46.0)
Hemoglobin: 13.5 g/dL (ref 12.0–15.0)
MCH: 31.7 pg (ref 26.0–34.0)
MCV: 92.5 fL (ref 78.0–100.0)
RBC: 4.26 MIL/uL (ref 3.87–5.11)
WBC: 4.3 10*3/uL (ref 4.0–10.5)

## 2013-04-06 LAB — BASIC METABOLIC PANEL
CO2: 26 mEq/L (ref 19–32)
Calcium: 9 mg/dL (ref 8.4–10.5)
Chloride: 103 mEq/L (ref 96–112)
Creat: 0.75 mg/dL (ref 0.50–1.10)
Glucose, Bld: 81 mg/dL (ref 70–99)

## 2013-04-06 LAB — LIPID PANEL
Cholesterol: 169 mg/dL (ref 0–200)
HDL: 59 mg/dL (ref >39)
Total CHOL/HDL Ratio: 2.9 Ratio
Triglycerides: 71 mg/dL (ref <150)

## 2013-04-12 NOTE — Telephone Encounter (Signed)
Ni response from previous contact attempt. Final attempt. Sent letter to pt home via [place] [employer] in attempt to contact pt to schedule appointment.

## 2013-04-18 NOTE — Telephone Encounter (Signed)
Pt has not responded to attempts to contact to schedule appointment. Referral filed.  

## 2013-05-05 ENCOUNTER — Telehealth (HOSPITAL_COMMUNITY): Payer: Self-pay | Admitting: Dietician

## 2013-05-05 NOTE — Telephone Encounter (Addendum)
Called back at 1312. No answer and unable to leave voicemail. Message states "please enter your remote access code". Will reattempt.

## 2013-05-05 NOTE — Telephone Encounter (Signed)
Received message from pt at [phone]. Pt would like to schedule appointment. See telephone encounter on 04/05/13 for further details. Noted pt has an appointment with Dr. Lodema Hong on 05/09/13.

## 2013-05-05 NOTE — Telephone Encounter (Signed)
Called again at [phone]. Appointment scheduled for 05/20/13 at 0900.

## 2013-05-09 ENCOUNTER — Ambulatory Visit (INDEPENDENT_AMBULATORY_CARE_PROVIDER_SITE_OTHER): Payer: Medicaid Other | Admitting: Family Medicine

## 2013-05-09 ENCOUNTER — Encounter: Payer: Self-pay | Admitting: Family Medicine

## 2013-05-09 VITALS — BP 122/82 | HR 94 | Resp 16 | Ht 66.0 in | Wt 243.4 lb

## 2013-05-09 DIAGNOSIS — F3289 Other specified depressive episodes: Secondary | ICD-10-CM

## 2013-05-09 DIAGNOSIS — F329 Major depressive disorder, single episode, unspecified: Secondary | ICD-10-CM

## 2013-05-09 DIAGNOSIS — J309 Allergic rhinitis, unspecified: Secondary | ICD-10-CM

## 2013-05-09 DIAGNOSIS — F32A Depression, unspecified: Secondary | ICD-10-CM | POA: Insufficient documentation

## 2013-05-09 DIAGNOSIS — F3341 Major depressive disorder, recurrent, in partial remission: Secondary | ICD-10-CM | POA: Insufficient documentation

## 2013-05-09 DIAGNOSIS — F339 Major depressive disorder, recurrent, unspecified: Secondary | ICD-10-CM | POA: Insufficient documentation

## 2013-05-09 DIAGNOSIS — E669 Obesity, unspecified: Secondary | ICD-10-CM

## 2013-05-09 MED ORDER — VENLAFAXINE HCL 37.5 MG PO TABS: 37.5000 mg | ORAL_TABLET | Freq: Two times a day (BID) | ORAL | Status: DC

## 2013-05-09 MED ORDER — PHENTERMINE HCL 37.5 MG PO TABS: 37.5000 mg | ORAL_TABLET | Freq: Every day | ORAL | Status: DC

## 2013-05-09 NOTE — Patient Instructions (Addendum)
F/u in 4 weeks  Please record everything you eat and  Learn how to count calories and measure portions  It is important that you exercise regularly at least 30 minutes 5 times a week. If you develop chest pain, have severe difficulty breathing, or feel very tired, stop exercising immediately and seek medical attention    Continue phentermine half tablet daily.  New for depression is effexor start one daily for 5 days then increase to one twice daily

## 2013-05-09 NOTE — Telephone Encounter (Signed)
Noted  

## 2013-05-15 NOTE — Progress Notes (Signed)
  Subjective:    Patient ID: Stacey Palmer, female    DOB: Oct 05, 1981, 32 y.o.   MRN: [id number]  HPI The PT is here for follow up and re-evaluation of chronic medical conditions, medication management and review of any available recent lab and radiology data.  Preventive health is updated, specifically  Cancer screening and Immunization.   States she is under increased stress , finances, relationship issues, no help with her kids, not focused on lifestyle changes as a result no weight loss Depressed, poor self esteem, not suicidal or homicidal      Review of Systems See HPI Denies recent fever or chills. Denies sinus pressure, nasal congestion, ear pain or sore throat. Denies chest congestion, productive cough or wheezing. Denies chest pains, palpitations and leg swelling Denies abdominal pain, nausea, vomiting,diarrhea or constipation.   Denies dysuria, frequency, hesitancy or incontinence. Denies joint pain, swelling and limitation in mobility. Denies headaches, seizures, numbness, or tingling. Denies skin break down or rash.        Objective:   Physical Exam  Patient alert and oriented and in no cardiopulmonary distress.  HEENT: No facial asymmetry, EOMI, no sinus tenderness,  oropharynx pink and moist.  Neck supple no adenopathy.  Chest: Clear to auscultation bilaterally.  CVS: S1, S2 no murmurs, no S3.  ABD: Soft non tender. Bowel sounds normal.  Ext: No edema  MS: Adequate ROM spine, shoulders, hips and knees.  Skin: Intact, no ulcerations or rash noted.  Psych: Poor  eye contact, blunted  affect. Memory intact  Anxious, tearful and  depressed appearing.  CNS: CN 2-12 intact, power, tone and sensation normal throughout.       Assessment & Plan:

## 2013-05-15 NOTE — Assessment & Plan Note (Signed)
Uncontrolled, not suicidal or homicidal Start medication, deferring therapy at this time, though would clearly benefit

## 2013-05-15 NOTE — Assessment & Plan Note (Signed)
>>  ASSESSMENT AND PLAN FOR DEPRESSION WRITTEN ON 05/15/2013  1:18 AM BY SIMPSON, MARGARET E, MD  Uncontrolled, not suicidal or homicidal Start medication, deferring therapy at this time, though would clearly benefit

## 2013-05-15 NOTE — Assessment & Plan Note (Signed)
Unchanged. Patient re-educated about  the importance of commitment to a  minimum of 150 minutes of exercise per week. The importance of healthy food choices with portion control discussed. Encouraged to start a food diary, count calories and to consider  joining a support group. Sample diet sheets offered. Goals set by the patient for the next several months.    

## 2013-05-15 NOTE — Assessment & Plan Note (Signed)
Controlled, no change in medication  

## 2013-05-20 ENCOUNTER — Encounter (HOSPITAL_COMMUNITY): Payer: Self-pay | Admitting: Dietician

## 2013-05-20 NOTE — Progress Notes (Signed)
Follow-Up Outpatient Nutrition Note Date: 05/20/2013  Appt Start Time: 0923  Nutrition Assessment:  Current weight: Weight: 236 lb (107.049 kg) Last weight: 240# (07/18/2011) BMI: Body mass index is 38.11 kg/(m^2).  Weight changes: 4# (1.6%) wt loss x 2 years  Stacey Palmer is a pt who is familiar to me, but was lost to follow-up. Her last appointment was 07/18/2011. She has been able to lose 4# in the past 2 years. She presents to the office with her 2 children, 54 year old Stacey Palmer and 59 year old Stacey Palmer.  She reports that she is very concerned about her weight and desires to make a commitment to lifestyle change. She reports she is also concerned for the health of her family, as she recently learned that Stacey Palmer is now in the obese category for her age, weight, and height.  She reports that is is now on phentermine. She reports snacks and fast food are her weakness, especially when her children ask for it. She has been choosing healthier snacks, such as yogurt and fruit, and decreasing fast food intake; she reports she and her children are tempted to order higher calorie items there. She also wants to know if pizza can still fit into her diet. She is not physically active. She will occasionally take her children to the park, which is across the street from her home. She has a swimming pool, which she reports she has not used recently due to a busy schedule. She used to walk, but discontinued this due to the hot weather.   Labs: CMP     Component Value Date/Time   NA 138 04/06/2013 0830   K 4.3 04/06/2013 0830   CL 103 04/06/2013 0830   CO2 26 04/06/2013 0830   GLUCOSE 81 04/06/2013 0830   BUN 12 04/06/2013 0830   CREATININE 0.75 04/06/2013 0830   CREATININE 0.68 06/06/2010 2049   CALCIUM 9.0 04/06/2013 0830    Lipid Panel     Component Value Date/Time   CHOL 169 04/06/2013 0830   TRIG 71 04/06/2013 0830   HDL 59 04/06/2013 0830   CHOLHDL 2.9 04/06/2013 0830   VLDL 14 04/06/2013 0830   LDLCALC 96 04/06/2013 0830      Lab Results  Component Value Date   HGBA1C 5.1 04/06/2013   HGBA1C 5.5 04/24/2011   Lab Results  Component Value Date   LDLCALC 96 04/06/2013   CREATININE 0.75 04/06/2013     Diet recall: Breakfast: 1/2 cup cheerios, 2% milk, small cup orange juice; Lunch: can or tuna O turky, piece of wheat bread, piece of fruit, water; Snack: yogurt or granola bar or peanut butter crackers; Dinner: meat and a vegetable  Nutrition Diagnosis: Obesity r/t excessive energy intake, physical inactivity AEB BMI 38.11.   Nutrition Intervention: Nutrition rx: 1500 kcal NAS, no sugar added diet; 3 meals per day; limit snacks; low calorie beverages only; 30 minutes physical activity daily  Education/ counseling provided: Educated pt on principles of weight management. Had long discussion with pt about the importance of making a commitment to lifestyle changes to improve health. Discussed principles of energy expenditure and how changes in diet and physical activity affect weight status. Discussed nutritional content of commonly eaten foods and suggested healthier alternatives. Discussed a general, healthful diet that includes low fat dairy, lean meats, whole fruits and vegetables, and whole grains most often. Discussed importance of a healthy diet along with regular physical activity (at least 30 minutes 5 times per week) to achieve  weight loss goals. Discussed specific examples of how to incorporate physical activity and healthy food choices as a family (example: making homemade thin crust pizza with veggies and salad instead of take out). Encouraged slow, moderate weight loss (0.5-2# weight loss per week) and adopting healthy lifestyle changes vs. obtaining a certain body type or weight. Encouraged weighing self weekly at a consistent day and time of choice. Showed pt functionality of MyFitnessPal and encouraged using a food diary to better track caloric intake. Used TeachBack to assess understanding.    Understanding/Motivation/ Ability to follow recommendations: Expect fair compliance.   Monitoring and Evaluation: Previous Goals: 1) 1-2# weight loss per week- progressing; 2) 30 minutes physical activity daily- progressing; 3) Keep food diary (ex. MyFitnessPal)- goal not met  Goals for next visit: 1) 1-2# weight loss per week; 2) 30 minutes physical activity daily; 3) Keep food diary (ex. MyFitnessPal)  Recommendations: 1) For weight loss: 1500 kcals daily; 2) Break up physical activity into smaller, more frequent sessions; 3) Walk early in AM or late PM, when the weather is not as extreme; 4) Make exercise a family activity (walk dogs)  F/U: 4-6 weeks. Scheduled for 06/22/13 at 1000.   Stacey Palmer, RD, LDN Date:05/20/2013 Appt EndTime: 1000

## 2013-06-08 ENCOUNTER — Encounter: Payer: Self-pay | Admitting: Family Medicine

## 2013-06-08 ENCOUNTER — Ambulatory Visit: Payer: BC Managed Care – PPO | Admitting: Family Medicine

## 2013-06-15 ENCOUNTER — Ambulatory Visit (INDEPENDENT_AMBULATORY_CARE_PROVIDER_SITE_OTHER): Payer: Medicaid Other | Admitting: Family Medicine

## 2013-06-15 ENCOUNTER — Encounter: Payer: Self-pay | Admitting: Family Medicine

## 2013-06-15 VITALS — BP 122/84 | HR 69 | Resp 16 | Wt 237.8 lb

## 2013-06-15 DIAGNOSIS — R5381 Other malaise: Secondary | ICD-10-CM

## 2013-06-15 DIAGNOSIS — E669 Obesity, unspecified: Secondary | ICD-10-CM

## 2013-06-15 DIAGNOSIS — J309 Allergic rhinitis, unspecified: Secondary | ICD-10-CM

## 2013-06-15 DIAGNOSIS — F329 Major depressive disorder, single episode, unspecified: Secondary | ICD-10-CM

## 2013-06-15 DIAGNOSIS — R5383 Other fatigue: Secondary | ICD-10-CM

## 2013-06-15 DIAGNOSIS — F3289 Other specified depressive episodes: Secondary | ICD-10-CM

## 2013-06-15 DIAGNOSIS — F32A Depression, unspecified: Secondary | ICD-10-CM

## 2013-06-15 NOTE — Progress Notes (Signed)
  Subjective:    Patient ID: Stacey Palmer, female    DOB: December 13, 1980, 32 y.o.   MRN: [id number]  HPI 5 day h/o ear pain, no drainage, fever or hearing loss. No trauma to ear. Does note increased nasal congestion and drainage, which is clear,  No fever or chills. No sore throat or cough. Pt reports much improvement in her depression since starting medication. More interested in going out, less crying , more energy, better self esteem. She has been working on changing her eating habits and is pleased with weight loss to date Walking on average 3 days per week, wants to increase number of days and duration of exercise     Review of Systems See HPI Denies recent fever or chills.  Denies chest congestion, productive cough or wheezing. Denies chest pains, palpitations and leg swelling Denies abdominal pain, nausea, vomiting,diarrhea or constipation.   Denies dysuria, frequency, hesitancy or incontinence. Denies joint pain, swelling and limitation in mobility. Denies headaches, seizures, numbness, or tingling. Denies  anxiety or insomnia. Denies skin break down or rash.        Objective:   Physical Exam  Patient alert and oriented and in no cardiopulmonary distress.  HEENT: No facial asymmetry, EOMI, no sinus tenderness,  oropharynx pink and moist.  Neck supple no adenopathy.TM clear bilaterally, erythema and edema of nasal mucosa  Chest: Clear to auscultation bilaterally.  CVS: S1, S2 no murmurs, no S3.  ABD: Soft non tender. Bowel sounds normal.  Ext: No edema  MS: Adequate ROM spine, shoulders, hips and knees.  Skin: Intact, no ulcerations or rash noted.  Psych: Good eye contact, normal affect. Memory intact not anxious less depressed appearing.  CNS: CN 2-12 intact, power, tone and sensation normal throughout.       Assessment & Plan:

## 2013-06-15 NOTE — Patient Instructions (Addendum)
F/u in 6 weeks, call if you need me before   No infection in ear, use sudafed one daily for the next 3 days ,  This will reduce pressure in the ear   Congrats on weight loss , keep it up  Commit to daily physical activity

## 2013-06-19 NOTE — Assessment & Plan Note (Signed)
Improved on medication which is well tolerated, pt to continue same dose, she is also encouraged commit to regular physical activity

## 2013-06-19 NOTE — Assessment & Plan Note (Signed)
Improved with antidepressant

## 2013-06-19 NOTE — Assessment & Plan Note (Signed)
Improved. Pt applauded on succesful weight loss through lifestyle change, and encouraged to continue same. Weight loss goal set for the next several months.  

## 2013-06-19 NOTE — Assessment & Plan Note (Signed)
>>  ASSESSMENT AND PLAN FOR DEPRESSION WRITTEN ON 06/19/2013  1:01 PM BY SIMPSON, MARGARET E, MD  Improved on medication which is well tolerated, pt to continue same dose, she is also encouraged commit to regular physical activity

## 2013-06-19 NOTE — Assessment & Plan Note (Signed)
Uncontrolled, causing right ear pain, no sign of infection in ear. Pt to use sudafed once daily for 3 days

## 2013-06-22 ENCOUNTER — Telehealth (HOSPITAL_COMMUNITY): Payer: Self-pay | Admitting: Dietician

## 2013-06-22 ENCOUNTER — Encounter (HOSPITAL_COMMUNITY): Payer: Self-pay | Admitting: Dietician

## 2013-06-22 NOTE — Telephone Encounter (Signed)
Received call from pt at 0927. She reports she will come in at 1030-1100.

## 2013-06-22 NOTE — Progress Notes (Signed)
Follow-Up Outpatient Nutrition Note Date: 06/22/2013  Appt Start Time: 1129  Nutrition Assessment:  Current weight: Weight: 233 lb (105.688 kg) ) BMI: Body mass index is 37.63 kg/(m^2).  Weight changes: -3# (1.3%) wt loss x  1 month  Stacey Palmer is making good progress. She has lost 3 pounds since last visit. She reports she is working hard at lifestyle modifications, but finds it difficult due to her schedule and demands of her children. She reports that her diet and her children's diet is different. She will serve her kids chicken nuggests, but she will opt for a can of tuna and a salad. She reports she is eating more vegetables and is cutting back on fried foods. Choosing healthy entrees when eating out continues to be a struggle. Family will typically eat out once a week, at a fast food establishment. Choices will include a [employer] cheese pizza, [employer] [place] sub on whole wheat bread, or McDonald's snack wrap and parfait.  She has not been keeping a food diary. She is contemplating using MyFitnessPal.  She is drinking mostly water, but wants to discuss other beverage options.  She reports she is more physically active. She is exercising for 30 minutes per day, either by swimming or walking. She complains that her legs are sore from exercise. However, suspect that there is underreporting of the amount of exercise done outside of her normal routine, as she mentions going up and down the stairs chasing after her kids.  She reveals to me that she is contemplating looking for a job or going to school to be a Engineer, site. She is hopeful that she can focus more on her weight loss efforts once her kids are in school.   Labs: CMP     Component Value Date/Time   NA 138 04/06/2013 0830   K 4.3 04/06/2013 0830   CL 103 04/06/2013 0830   CO2 26 04/06/2013 0830   GLUCOSE 81 04/06/2013 0830   BUN 12 04/06/2013 0830   CREATININE 0.75 04/06/2013 0830   CREATININE 0.68 06/06/2010 2049   CALCIUM 9.0 04/06/2013 0830     Lipid Panel     Component Value Date/Time   CHOL 169 04/06/2013 0830   TRIG 71 04/06/2013 0830   HDL 59 04/06/2013 0830   CHOLHDL 2.9 04/06/2013 0830   VLDL 14 04/06/2013 0830   LDLCALC 96 04/06/2013 0830     Lab Results  Component Value Date   HGBA1C 5.1 04/06/2013   HGBA1C 5.5 04/24/2011   Lab Results  Component Value Date   LDLCALC 96 04/06/2013   CREATININE 0.75 04/06/2013     Diet recall: Breakfast: 1/2 cup cheerios or honey smacks, 2% milk, small cup orange juice; Lunch: 1 hot dog OR can of tuna OR [place], piece of wheat bread, fruit, waterr; Snack: yogurt or granola bar or peanut butter crackers; Dinner: chicken, rice, beans, salad, water  Nutrition Diagnosis: Obesity r/t excessive energy intake, physical inactivity continues  Nutrition Intervention: Nutrition rx: 1500 kcal NAS, no sugar added diet; 3 meals per day; limit snacks; low calorie beverages only; 30 minutes physical activity daily  Education/ counseling provided: Most of the visit was spent on counseling to encourage pt to make lifelong positive behavioral changes for her health. Discussed options of healthy beverages, including water, water with fruit slices, and sugar free drink mixes. Provided samples of sugar free Hawaiian Punch powdered drink mix. Gave specific examples for healthy snacks. Discussed ways to to choose more healthful items when  eating out, such a ordering a side salad instead of fries and choosing child sized entrees. Discussed strategies to help her and her family be more physically active daily. Provided encouragement. Discussed goal for weight loss; educated pt on realistic goals for weight loss (0.5-2# per week). Teach back method used.   Understanding/Motivation/ Ability to follow recommendations: Expect fair compliance.   Monitoring and Evaluation: Previous Goals: 1) 1-2# weight loss per week- progressing; 2) 30 minutes physical activity daily- progressing; 3) Keep food diary (ex. MyFitnessPal)- goal  not met  Goals for next visit: 1) 1-2# weight loss per week; 2) 30 minutes physical activity daily; 3) Keep food diary (ex. MyFitnessPal)  Recommendations: 1) For weight loss: 1500 kcals daily; 2) Break up physical activity into smaller, more frequent sessions; 3) Use preportioned cups of snacks to control portions; 4) Order child sized entrees at restaurants, choose veggies as a side  F/U: 4-6 weeks. Scheduled for 07/20/13 at 1100.   Melody Haver, RD, LDN Date:06/22/2013 Appt EndTime: 1208

## 2013-06-22 NOTE — Telephone Encounter (Signed)
Called back at 0925. No answer; unable to leave voicemail. Message reports "please enter remote access code".

## 2013-06-22 NOTE — Telephone Encounter (Signed)
Received voicemail from pt left at [phone]. She reports she wants to be seen today, but unable to make appointment time. Would like to come in later today.

## 2013-06-22 NOTE — Progress Notes (Signed)
Pt arrived more than 30 minutes late to appointment.

## 2013-06-23 ENCOUNTER — Telehealth: Payer: Self-pay | Admitting: Family Medicine

## 2013-06-23 DIAGNOSIS — E669 Obesity, unspecified: Secondary | ICD-10-CM

## 2013-06-23 MED ORDER — PHENTERMINE HCL 37.5 MG PO TABS: 37.5000 mg | ORAL_TABLET | Freq: Every day | ORAL | Status: DC

## 2013-06-23 NOTE — Telephone Encounter (Signed)
Printed for signature and dr approval then will fax to [facility]

## 2013-07-05 ENCOUNTER — Telehealth: Payer: Self-pay

## 2013-07-05 NOTE — Telephone Encounter (Signed)
Patient states she has been getting these very sore areas of broken skin between her toes, really hurting her, I offered an appt with Dr upon return but also gave her the option of urgent care. She didn't want to wait until Dr came back

## 2013-07-11 ENCOUNTER — Other Ambulatory Visit: Payer: Self-pay | Admitting: Family Medicine

## 2013-07-20 ENCOUNTER — Encounter (HOSPITAL_COMMUNITY): Payer: Self-pay | Admitting: Dietician

## 2013-07-20 NOTE — Progress Notes (Signed)
Pt was a no-show for appointment scheduled for 07/20/2013 at 1100. Sent letter to pt home notifying pt of no-show and requesting rescheduling appointment.

## 2013-07-26 ENCOUNTER — Telehealth (HOSPITAL_COMMUNITY): Payer: Self-pay | Admitting: Dietician

## 2013-07-26 NOTE — Telephone Encounter (Signed)
Pt left another message at 0905. Wants to reschedule appointment.

## 2013-07-26 NOTE — Telephone Encounter (Addendum)
Received message left by pt at 0848.

## 2013-07-26 NOTE — Telephone Encounter (Signed)
Called back at 1611. No answer. Unable to leave voicemail. Message reports "please enter your remote access code'.

## 2013-07-29 NOTE — Telephone Encounter (Signed)
Called back at 0957. Appointment scheduled for 10/8 at 1000.

## 2013-07-29 NOTE — Telephone Encounter (Signed)
Received multiple voicemails from pt left on 9/24 at 0849, 9/24 at 0946, and 9/25 at 1127.

## 2013-08-11 ENCOUNTER — Telehealth (HOSPITAL_COMMUNITY): Payer: Self-pay | Admitting: Dietician

## 2013-08-11 NOTE — Telephone Encounter (Signed)
Called back at 1232. Appointment scheduled for 08/18/13 at 1130. Noted this is pt's second no-show in the past month.

## 2013-08-11 NOTE — Telephone Encounter (Signed)
Received two voicemails from pt: one left on 08/10/13 at 0725 and another left at 08/11/13 at 1144. She reports she no-showed to appointment yesterday due to her daughter's dentist appointment. She would like to reschedule.

## 2013-08-18 ENCOUNTER — Telehealth (HOSPITAL_COMMUNITY): Payer: Self-pay | Admitting: Dietician

## 2013-08-18 NOTE — Telephone Encounter (Signed)
This is the third no show in the past month.

## 2013-08-18 NOTE — Telephone Encounter (Signed)
Pt was a no-show for appointment scheduled for 08/18/2013 at 1130. Sent letter to pt home notifying pt of no-show and requesting rescheduling appointment.

## 2013-08-22 ENCOUNTER — Telehealth (HOSPITAL_COMMUNITY): Payer: Self-pay | Admitting: Dietician

## 2013-08-22 NOTE — Telephone Encounter (Signed)
Received multiple voicemails left on 08/19/13 at 1529 and 1537 and 08/20/13 at 0857 and 0908. Pt was to reschedule missed appointment. Noted pt has missed 3 consecutive appointments this past month.

## 2013-08-22 NOTE — Telephone Encounter (Signed)
Called at 1030 and left message on voicemail. Offered 08/25/13 appointment at 1100. Requested call back to confirm.

## 2013-08-22 NOTE — Telephone Encounter (Signed)
Received voicemail left at 1212. Pt confirmed appointment on 08/25/13 at 1100.

## 2013-08-24 ENCOUNTER — Ambulatory Visit (INDEPENDENT_AMBULATORY_CARE_PROVIDER_SITE_OTHER): Payer: Medicaid Other | Admitting: Family Medicine

## 2013-08-24 ENCOUNTER — Encounter: Payer: Self-pay | Admitting: Family Medicine

## 2013-08-24 VITALS — BP 138/84 | HR 65 | Temp 97.8°F | Resp 16 | Ht 66.0 in | Wt 234.1 lb

## 2013-08-24 DIAGNOSIS — F32A Depression, unspecified: Secondary | ICD-10-CM

## 2013-08-24 DIAGNOSIS — F329 Major depressive disorder, single episode, unspecified: Secondary | ICD-10-CM

## 2013-08-24 DIAGNOSIS — F3289 Other specified depressive episodes: Secondary | ICD-10-CM

## 2013-08-24 DIAGNOSIS — J309 Allergic rhinitis, unspecified: Secondary | ICD-10-CM

## 2013-08-24 DIAGNOSIS — E669 Obesity, unspecified: Secondary | ICD-10-CM

## 2013-08-24 DIAGNOSIS — J029 Acute pharyngitis, unspecified: Secondary | ICD-10-CM | POA: Insufficient documentation

## 2013-08-24 DIAGNOSIS — Z23 Encounter for immunization: Secondary | ICD-10-CM

## 2013-08-24 MED ORDER — AZITHROMYCIN 250 MG PO TABS: ORAL_TABLET | ORAL | Status: AC

## 2013-08-24 MED ORDER — FLUCONAZOLE 150 MG PO TABS: ORAL_TABLET | ORAL | Status: AC

## 2013-08-24 MED ORDER — PHENTERMINE HCL 37.5 MG PO TABS: 37.5000 mg | ORAL_TABLET | Freq: Every day | ORAL | Status: DC

## 2013-08-24 NOTE — Assessment & Plan Note (Signed)
Improved, continue current meds It is important that you exercise regularly at least 30 minutes 5 times a week. If you develop chest pain, have severe difficulty breathing, or feel very tired, stop exercising immediately and seek medical attention

## 2013-08-24 NOTE — Progress Notes (Signed)
  Subjective:    Patient ID: Stacey Palmer, female    DOB: 08/01/1981, 32 y.o.   MRN: [id number]  HPI 2 day h/o sore thrpoat and tender glands , denies fever, chills or sinus pressure, no productive cough States depression has improved on medication, and she has  no adverse side effects. Has been seeing nutritionist, has been exercising and finds thyat the phentermine is helpful with appetite suppression   Review of Systems See HPI Denies chest pains, palpitations and leg swelling Denies abdominal pain, nausea, vomiting,diarrhea or constipation.   Denies dysuria, frequency, hesitancy or incontinence. Denies joint pain, swelling and limitation in mobility. Denies headaches, seizures, numbness, or tingling. Denies uncontrolled  depression, anxiety or insomnia. Denies skin break down or rash.        Objective:   Physical Exam  Patient alert and oriented and in no cardiopulmonary distress.  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  Chest: Clear to auscultation bilaterally.  CVS: S1, S2 no murmurs, no S3.  ABD: Soft non tender. Bowel sounds normal.  Ext: No edema  MS: Adequate ROM spine, shoulders, hips and knees.  Skin: Intact, no ulcerations or rash noted.  Psych: Good eye contact, normal affect. Memory intact not anxious or depressed appearing.  CNS: CN 2-12 intact, power, tone and sensation normal throughout.       Assessment & Plan:

## 2013-08-24 NOTE — Assessment & Plan Note (Signed)
Ulcer right posterior oropharynx Tender adenopathy z pack prescribed, pt to do salt water gargles also

## 2013-08-24 NOTE — Assessment & Plan Note (Signed)
>>  ASSESSMENT AND PLAN FOR DEPRESSION WRITTEN ON 08/24/2013 10:12 PM BY SIMPSON, MARGARET E, MD  Improved, continue current meds It is important that you exercise regularly at least 30 minutes 5 times a week. If you develop chest pain, have severe difficulty breathing, or feel very tired, stop exercising immediately and seek medical attention

## 2013-08-24 NOTE — Assessment & Plan Note (Signed)
Controlled, no change in medication  

## 2013-08-24 NOTE — Patient Instructions (Addendum)
F/u in 4 month depression score at that visit, call if you need me before  You have an ulcer in the back of your throat, z pack prescribed.  Gargle with salt water 3 times daily   Increase phentermine to one daily, and continue exercise and nutrition visits.  Weight loss goal of 3 pounds per month

## 2013-08-24 NOTE — Assessment & Plan Note (Signed)
Improved. Pt applauded on succesful weight loss through lifestyle change, and encouraged to continue same. Weight loss goal set for the next several months.Four pounds per month Dose increase in phentermine Pt to continue sessions with nutritionist

## 2013-08-25 ENCOUNTER — Encounter (HOSPITAL_COMMUNITY): Payer: Self-pay | Admitting: Dietician

## 2013-08-25 NOTE — Progress Notes (Signed)
Follow-Up Outpatient Nutrition Note Date: 08/25/2013  Appt Start Time: 1110  Nutrition Assessment:  Current weight: Weight: 231 lb (104.781 kg) ) BMI: Body mass index is 37.3 kg/(m^2).  Weight changes: -2# (0.8%) wt loss x  2 months  Stacey Palmer's weight has stabilized. Unfortunately, she missed her last 3 appointments. She reports "I keep seeing 'obese' on my medical records and I don't like that". She reports that recently attended a seminar on bariatric surgery and this has motivated her to focus on lifestyle changes. Had a long discussion with pt that any strategy for weight loss requires lifelong lifestyle change.  She desires to lose about 30 poinds so she can achieve a weight below 200#.  She reports that following a healthy diet and feeding her children continues to be a challenge, especially when her children request fast food. She admits that they go out to a fast food restaurant about once a week and she always orders a snack wrap.  She reports that she is trying to get back into walking. Physical activity is minimal. She reveals psychosocial stressors, including trying to go back to school and finding a job.    Labs: CMP     Component Value Date/Time   NA 138 04/06/2013 0830   K 4.3 04/06/2013 0830   CL 103 04/06/2013 0830   CO2 26 04/06/2013 0830   GLUCOSE 81 04/06/2013 0830   BUN 12 04/06/2013 0830   CREATININE 0.75 04/06/2013 0830   CREATININE 0.68 06/06/2010 2049   CALCIUM 9.0 04/06/2013 0830    Lipid Panel     Component Value Date/Time   CHOL 169 04/06/2013 0830   TRIG 71 04/06/2013 0830   HDL 59 04/06/2013 0830   CHOLHDL 2.9 04/06/2013 0830   VLDL 14 04/06/2013 0830   LDLCALC 96 04/06/2013 0830     Lab Results  Component Value Date   HGBA1C 5.1 04/06/2013   HGBA1C 5.5 04/24/2011   Lab Results  Component Value Date   LDLCALC 96 04/06/2013   CREATININE 0.75 04/06/2013     Diet recall: Breakfast: bowl of cereal (Special K) with vanilla soy milk; Lunch: can of chicken and hot peppers; Dinner:  green peas and manwich  Nutrition Diagnosis: Obesity r/t excessive energy intake, physical inactivity continues  Nutrition Intervention: Nutrition rx: 1500 kcal NAS, no sugar added diet; 3 meals per day; limit snacks; low calorie beverages only; 30 minutes physical activity daily  Education/ counseling provided: Most of the visit was spent on counseling to encourage pt to make lifelong positive behavioral changes for her health. Reinforced importance of regular physical activity along with a healthy diet to assist with weight loss. Discussed ways to decrease empty calories in diet, including increasing calorie free beverages and focusing on portion control. We also discussed quick, healthy breakfast ideas. Discussed importance of weight loss to prolong diagnosis of chronic health conditions. Provided encouragement. Discussed goal for weight loss; educated pt on realistic goals for weight loss (0.5-2# per week). Teach back method used.   Understanding/Motivation/ Ability to follow recommendations: Expect fair compliance.   Monitoring and Evaluation: Previous Goals: 1) 1-2# weight loss per week- progressing; 2) 30 minutes physical activity daily- goal not met; 3) Keep food diary (ex. MyFitnessPal)- goal not met  Goals for next visit: 1) 1-2# weight loss per week; 2) 30 minutes physical activity daily; 3) Keep food diary (ex. MyFitnessPal)  Recommendations: 1) For weight loss: 1500 kcals daily; 2) Order dollar menu or child size entrees at restaurants  and order veggies as a side; 3) Calorie free beverages only; 4) Involve entire fa,ily in physical activity  F/U: 4-6 weeks. Scheduled for 09/22/13 at 1100.   Melody Haver, RD, LDN Date:08/25/2013 Appt EndTime: 1155

## 2013-09-22 ENCOUNTER — Telehealth (HOSPITAL_COMMUNITY): Payer: Self-pay | Admitting: Dietician

## 2013-09-22 NOTE — Telephone Encounter (Signed)
Pt called at 1017. Appointment scheduled for 10/03/13 at 1100.

## 2013-09-22 NOTE — Telephone Encounter (Signed)
Called at 1016. Line was busy.

## 2013-09-22 NOTE — Telephone Encounter (Signed)
Received voicemail left on 09/21/13 at 1713. Pt unable to make appointment. Needs to reschedule.

## 2013-09-25 ENCOUNTER — Other Ambulatory Visit: Payer: Self-pay | Admitting: Family Medicine

## 2013-10-03 ENCOUNTER — Encounter (HOSPITAL_COMMUNITY): Payer: Self-pay | Admitting: Dietician

## 2013-10-03 NOTE — Progress Notes (Signed)
Pt was a no-show for appointment scheduled for 10/03/2013 at 1100. This is the 6 th missed appointment in the past 2 months.

## 2013-10-05 ENCOUNTER — Telehealth (HOSPITAL_COMMUNITY): Payer: Self-pay | Admitting: Dietician

## 2013-10-05 NOTE — Telephone Encounter (Signed)
Returned call at [facility]. Appointment scheduled for 09/10/13 at 1100.

## 2013-10-05 NOTE — Telephone Encounter (Signed)
Received voicemail from pt left at 0934. Pt requesting rescheduling appointment.

## 2013-10-10 ENCOUNTER — Encounter (HOSPITAL_COMMUNITY): Payer: Self-pay | Admitting: Dietician

## 2013-10-10 NOTE — Progress Notes (Signed)
Follow-Up Outpatient Nutrition Note Date: 10/10/2013  Appt Start Time: 1149  Nutrition Assessment:  Current weight: Weight: 213 lb (96.616 kg) ) BMI: Body mass index is 34.4 kg/(m^2).  Weight changes: -# (0.%) wt loss x  2 months  Pt arrived 50 minutes late for appointment.  Ms. Loud is disappointed that he hasn't lost weight. She reports "I've been bad; eating healthy during the holidays is so hard".  Provided emotional support and discussed that pt has maintained weight, which is a good goal for the holiday season.  She continues to be motivated to lose weight by diet and exercise. She shares with me again that she would rather lose weight "the natural way" then pursue bariatric surgery.  She reports that she continues to desire to lose approximately 30#. Discussed slow, moderate weight loss and achievable goals.  {hysiocal activity has increased; she is walking her children and dog 3-4 days per week at a nearby park.   Labs: CMP     Component Value Date/Time   NA 138 04/06/2013 0830   K 4.3 04/06/2013 0830   CL 103 04/06/2013 0830   CO2 26 04/06/2013 0830   GLUCOSE 81 04/06/2013 0830   BUN 12 04/06/2013 0830   CREATININE 0.75 04/06/2013 0830   CREATININE 0.68 06/06/2010 2049   CALCIUM 9.0 04/06/2013 0830    Lipid Panel     Component Value Date/Time   CHOL 169 04/06/2013 0830   TRIG 71 04/06/2013 0830   HDL 59 04/06/2013 0830   CHOLHDL 2.9 04/06/2013 0830   VLDL 14 04/06/2013 0830   LDLCALC 96 04/06/2013 0830     Lab Results  Component Value Date   HGBA1C 5.1 04/06/2013   HGBA1C 5.5 04/24/2011   Lab Results  Component Value Date   LDLCALC 96 04/06/2013   CREATININE 0.75 04/06/2013     Diet recall: Breakfast: bowl of cereal (Special K) with vanilla soy milk; Lunch: can of chicken and hot peppers; Dinner: green peas and manwich  Nutrition Diagnosis: Obesity r/t excessive energy intake, physical inactivity continues  Nutrition Intervention: Nutrition rx: 1500 kcal NAS, no sugar added diet; 3 meals per  day; limit snacks; low calorie beverages only; 30 minutes physical activity daily  Education/ counseling provided: Most of the visit was spent on counseling to encourage pt to make lifelong positive behavioral changes for her health. Reinforced importance of regular physical activity along with a healthy diet to assist with weight loss. Discussed ways to decrease empty calories in diet, including increasing calorie free beverages and focusing on portion control. We also discussed quick, healthy breakfast ideas. Discussed importance of weight loss to prolong diagnosis of chronic health conditions. Provided encouragement. Discussed goal for weight loss; educated pt on realistic goals for weight loss (0.5-2# per week).  Encouraged weight maintenance during holiday season. Pt was given "10 Simple Steps" handout per her request.Teach back method used.   Understanding/Motivation/ Ability to follow recommendations: Expect fair compliance.   Monitoring and Evaluation: Previous Goals: 1) 1-2# weight loss per week- progressing; 2) 30 minutes physical activity daily- progressingt; 3) Keep food diary (ex. MyFitnessPal)- goal not met  Goals for next visit: 1) Weight maintenace; 2) 30 minutes physical activity daily; 3) Keep food diary (ex. MyFitnessPal)  Recommendations: 1) Focus on weight maintenance during holidays; 2) Order dollar menu or child size entrees at restaurants and order veggies as a side; 3) Calorie free beverages only; 4) Involve entire family in physical activity  F/U: 4-6 weeks. Scheduled for 09/22/13  at 1100.   Melody Haver, RD, LDN Date:10/10/2013 Appt EndTime: 1225

## 2013-10-19 ENCOUNTER — Encounter: Payer: Self-pay | Admitting: Family Medicine

## 2013-10-19 ENCOUNTER — Ambulatory Visit (INDEPENDENT_AMBULATORY_CARE_PROVIDER_SITE_OTHER): Payer: Medicaid Other | Admitting: Family Medicine

## 2013-10-19 VITALS — BP 122/82 | HR 86 | Resp 16 | Ht 66.0 in | Wt 231.0 lb

## 2013-10-19 DIAGNOSIS — F329 Major depressive disorder, single episode, unspecified: Secondary | ICD-10-CM

## 2013-10-19 DIAGNOSIS — L309 Dermatitis, unspecified: Secondary | ICD-10-CM

## 2013-10-19 DIAGNOSIS — L259 Unspecified contact dermatitis, unspecified cause: Secondary | ICD-10-CM

## 2013-10-19 DIAGNOSIS — E669 Obesity, unspecified: Secondary | ICD-10-CM

## 2013-10-19 DIAGNOSIS — F3289 Other specified depressive episodes: Secondary | ICD-10-CM

## 2013-10-19 DIAGNOSIS — F32A Depression, unspecified: Secondary | ICD-10-CM

## 2013-10-19 DIAGNOSIS — J309 Allergic rhinitis, unspecified: Secondary | ICD-10-CM

## 2013-10-19 MED ORDER — PREDNISONE 5 MG PO TABS: 5.0000 mg | ORAL_TABLET | Freq: Two times a day (BID) | ORAL | Status: AC

## 2013-10-19 MED ORDER — PHENTERMINE HCL 37.5 MG PO TABS: 37.5000 mg | ORAL_TABLET | Freq: Every day | ORAL | Status: DC

## 2013-10-19 MED ORDER — HYDROXYZINE HCL 10 MG PO TABS: ORAL_TABLET | ORAL | Status: AC

## 2013-10-19 NOTE — Assessment & Plan Note (Signed)
Controlled, no change in medication  

## 2013-10-19 NOTE — Assessment & Plan Note (Signed)
>>  ASSESSMENT AND PLAN FOR DEPRESSION WRITTEN ON 10/19/2013  8:49 PM BY SIMPSON, MARGARET E, MD  Improved and controlled on current medication , continue same

## 2013-10-19 NOTE — Progress Notes (Signed)
   Subjective:    Patient ID: Stacey Palmer, female    DOB: 01/26/1981, 32 y.o.   MRN: [id number]  HPI The PT is here for follow up and re-evaluation of chronic medical conditions, medication management and review of any available recent lab and radiology data.  Preventive health is updated, specifically  Cancer screening and Immunization.  Continues to follow with nutritionist, has been doing some exercise, unfortunately recently over eating sweets, no recent cnage in weight but 3 pounds per monht in the past 4 monhts , which is good   The PT denies any adverse reactions to current medications since the last visit.  3 day h/o excessive itching of skin, recent change in soap. No difficulty in breathing or swallowing No other family member affected     Review of Systems See HPI Denies recent fever or chills. Denies sinus pressure, nasal congestion, ear pain or sore throat. Denies chest congestion, productive cough or wheezing. Denies chest pains, palpitations and leg swelling Denies abdominal pain, nausea, vomiting,diarrhea or constipation.   Denies dysuria, frequency, hesitancy or incontinence. Denies joint pain, swelling and limitation in mobility. Denies headaches, seizures, numbness, or tingling. Denies depression, anxiety or insomnia.        Objective:   Physical Exam  Patient alert and oriented and in no cardiopulmonary distress.  HEENT: No facial asymmetry, EOMI, no sinus tenderness,  oropharynx pink and moist.  Neck supple no adenopathy.  Chest: Clear to auscultation bilaterally.  CVS: S1, S2 no murmurs, no S3.  ABD: Soft non tender. Bowel sounds normal.  Ext: No edema  MS: Adequate ROM spine, shoulders, hips and knees.  Skin: Intact, no ulcerations or rash noted.  Psych: Good eye contact, normal affect. Memory intact not anxious or depressed appearing.  CNS: CN 2-12 intact, power, tone and sensation normal throughout.       Assessment & Plan:

## 2013-10-19 NOTE — Patient Instructions (Signed)
F/u in 4 month, call if you need me before  Pls stay focused on changing to healthy habits.  Weight loss goal of 2 to 3 pounds per month  Prednisone and hydroxyzine sent in  For itch

## 2013-10-19 NOTE — Assessment & Plan Note (Signed)
Improved and controlled on current medication, continue same 

## 2013-10-19 NOTE — Assessment & Plan Note (Signed)
3 day h/o generalized itch, recent change in soap, likley allergy based. Prednisone and hydroxyzine prescribed

## 2013-10-19 NOTE — Assessment & Plan Note (Signed)
Unchnaged. Patient re-educated about  the importance of commitment to a  minimum of 150 minutes of exercise per week. The importance of healthy food choices with portion control discussed. Encouraged to start a food diary, count calories and to consider  joining a support group. Sample diet sheets offered. Goals set by the patient for the next several months.    

## 2013-11-02 ENCOUNTER — Telehealth: Payer: Self-pay

## 2013-11-02 MED ORDER — DIPHENOXYLATE-ATROPINE 2.5-0.025 MG PO TABS: 1.0000 | ORAL_TABLET | Freq: Four times a day (QID) | ORAL | Status: AC | PRN

## 2013-11-02 NOTE — Telephone Encounter (Signed)
Left message with information and to go to the ER if symptoms worsen

## 2013-11-02 NOTE — Telephone Encounter (Signed)
Same as above, I will send in lomotil, may be more affordable than the OTC, pls let her know Also tell her if worsens go to urgent care or ED

## 2013-11-02 NOTE — Telephone Encounter (Signed)
Patient states that she has had diarrhea since yesterday. Went all day and all night, unable to give me an amount of times. No nausea or vomiting just a rotten taste when she burps. I advised BRAT diet and OTC immodium and she wants to know what you recommend. Please advise

## 2013-11-15 ENCOUNTER — Encounter (HOSPITAL_COMMUNITY): Payer: Self-pay | Admitting: Dietician

## 2013-11-15 NOTE — Progress Notes (Signed)
Follow-Up Outpatient Nutrition Note Date: 11/15/2013  Appt Start Time: 1111  Nutrition Assessment:  Current weight: Weight: 236 lb (107.049 kg) ) BMI: Body mass index is 38.11 kg/(m^2).  Weight changes:+23# (10.9%) x 1 month  Stacey Palmer is upset that she gained weight over the holidays. Weight gain may be due to entry error- likely past wt was 231# (5# or 2.2% gain x 1 month). Pt reports she feels like she lost 13# since she last saw Stacey Palmer. She continues to strive toward her goal of 200#. She reveals that her clothes fit better. She reports having kids makes it harder for her to eat healthy. She complains her son wants to eats all the time, and chooses unhealthy options. She does not want to deprive her children of foods they enjoy. She admits to skipping lunch, as she is out of of options for healthy ideas. She also inquires about snacks.  She reports decreasing fried foods. She has not been exercising but is contemplating walking with her kids and dog at a nearby park.    Labs: CMP     Component Value Date/Time   NA 138 04/06/2013 0830   K 4.3 04/06/2013 0830   CL 103 04/06/2013 0830   CO2 26 04/06/2013 0830   GLUCOSE 81 04/06/2013 0830   BUN 12 04/06/2013 0830   CREATININE 0.75 04/06/2013 0830   CREATININE 0.68 06/06/2010 2049   CALCIUM 9.0 04/06/2013 0830    Lipid Panel     Component Value Date/Time   CHOL 169 04/06/2013 0830   TRIG 71 04/06/2013 0830   HDL 59 04/06/2013 0830   CHOLHDL 2.9 04/06/2013 0830   VLDL 14 04/06/2013 0830   LDLCALC 96 04/06/2013 0830     Lab Results  Component Value Date   HGBA1C 5.1 04/06/2013   HGBA1C 5.5 04/24/2011   Lab Results  Component Value Date   LDLCALC 96 04/06/2013   CREATININE 0.75 04/06/2013     Diet recall: Breakfast: bowl of oatmeal, juice; Lunch: can of tuna on whole wheat toast; Dinner: baked hamburger, potatoes, veggies; SNacks: Special K Pastry Crisps   Nutrition Diagnosis: Obesity r/t excessive energy intake, physical inactivity continues  Nutrition  Intervention: Nutrition rx: 1500 kcal NAS, no sugar added diet; 3 meals per day; limit snacks; low calorie beverages only; 30 minutes physical activity daily  Education/ counseling provided: Most of the visit was spent on counseling to encourage pt to make lifelong positive behavioral changes for her health. Reinforced importance of regular physical activity along with a healthy diet to assist with weight loss. Discussed ways to decrease empty calories in diet, including increasing calorie free beverages and focusing on portion control. We also discussed quick, healthy breakfast and lunch ideas. Also discussed healthy snack ideas.  Discussed importance of weight loss to prolong diagnosis of chronic health conditions. Provided encouragement. Discussed goal for weight loss; educated pt on realistic goals for weight loss (0.5-2# per week).  Encouraged weight maintenance during holiday season. Pt was given AND's "25 Simple Snacks" handout, Stevia Sample, Mrs. Armor sample, and [employer] Calendar per her request.Teach back method used.   Understanding/Motivation/ Ability to follow recommendations: Expect fair compliance.   Monitoring and Evaluation: Previous Goals: 1) Weight maintenance deteriorated; 2) 30 minutes physical activity daily- goal not met; 3) Keep food diary (ex. MyFitnessPal)- goal not met  Goals for next visit: 1) 1-2# weight loss per week; 2) 30 minutes physical activity daily; 3) Keep food diary (ex. MyFitnessPal); 4) 3 meals  per day  Recommendations: 1) Involve entire family (including dogs) in physical activity; 2) Keep food diary; 3) Try to eat around the same time each day  F/U: 4 weeks. Scheduled for 11/29/12 at 1100.   Stacey Palmer, RD, LDN Date:11/15/2013 Appt EndTime: 8003

## 2013-11-18 ENCOUNTER — Other Ambulatory Visit: Payer: Self-pay

## 2013-11-18 DIAGNOSIS — E669 Obesity, unspecified: Secondary | ICD-10-CM

## 2013-11-18 MED ORDER — PHENTERMINE HCL 37.5 MG PO TABS: 37.5000 mg | ORAL_TABLET | Freq: Every day | ORAL | Status: DC

## 2013-11-29 ENCOUNTER — Encounter (HOSPITAL_COMMUNITY): Payer: Self-pay | Admitting: Dietician

## 2013-11-29 NOTE — Progress Notes (Signed)
Follow-Up Outpatient Nutrition Note Date: 11/29/2013  Appt Start Time: 1116  Nutrition Assessment:  Current weight: Weight: 240 lb (108.863 kg) ) BMI: Body mass index is 38.76 kg/(m^2).  Weight changes:+4# (1.7%) x 1 month  Stacey Palmer continues to gain weight and this upsets her. She reports "I need to get back on it".  She attributes most of her weight gain to her daughter's birthday party and the multiple sweets that were served there. She also admits to having a lot of sweets and junk food in the house. She expresses remorse about this stating "I see my kids picking up my bad habits and snacking". She reports she still would like to lose 40# and would like to get her family involved, however, this is a struggle for her, as her kids crave junk food and fast food. She states "they go through crackers like it's nobody's business".  Physical activity is also a barrier. She is still in the contemplation stage, but has done minimal to no exercise since last appointment. Her plan is to walk at the park near her home 2 times per day with her kids and dog.  She has multiple questions about snacks that are healthy for her and her kids.   Labs: CMP     Component Value Date/Time   NA 138 04/06/2013 0830   K 4.3 04/06/2013 0830   CL 103 04/06/2013 0830   CO2 26 04/06/2013 0830   GLUCOSE 81 04/06/2013 0830   BUN 12 04/06/2013 0830   CREATININE 0.75 04/06/2013 0830   CREATININE 0.68 06/06/2010 2049   CALCIUM 9.0 04/06/2013 0830    Lipid Panel     Component Value Date/Time   CHOL 169 04/06/2013 0830   TRIG 71 04/06/2013 0830   HDL 59 04/06/2013 0830   CHOLHDL 2.9 04/06/2013 0830   VLDL 14 04/06/2013 0830   LDLCALC 96 04/06/2013 0830     Lab Results  Component Value Date   HGBA1C 5.1 04/06/2013   HGBA1C 5.5 04/24/2011   Lab Results  Component Value Date   LDLCALC 96 04/06/2013   CREATININE 0.75 04/06/2013     Diet recall: Breakfast: special K with 1% milk; Lunch: [employer] sandwich with lettuce, tomato, and honey  mustard; Dinner: 1/2 baked sweet potato, cabbage, and meat; Snack: piece of fruit, yogurt, or cereal   Nutrition Diagnosis: Obesity r/t excessive energy intake, physical inactivity continues  Nutrition Intervention: Nutrition rx: 1500 kcal NAS, no sugar added diet; 3 meals per day; limit snacks; low calorie beverages only; 30 minutes physical activity daily  Education/ counseling provided: Most of the visit was spent on counseling to encourage pt to make lifelong positive behavioral changes for her health. Reinforced importance of regular physical activity along with a healthy diet to assist with weight loss. Discussed ways to decrease empty calories in diet, including increasing calorie free beverages and focusing on portion control. We also discussed quick, healthy breakfast and lunch ideas. Also discussed healthy snack ideas.  Discussed importance of weight loss to prolong diagnosis of chronic health conditions. Provided encouragement. Discussed goal for weight loss; educated pt on realistic goals for weight loss (0.5-2# per week).  Encouraged weight maintenance during holiday season. Pt was given AND's "25 Simple Snacks" handout, Stevia Samples, and "True Lemon" samples per her request.Teach back method used.   Understanding/Motivation/ Ability to follow recommendations: Expect fair compliance.   Monitoring and Evaluation: Previous Goals: 1) 1-2# weight loss per week- deteriorated; 2) 30 minutes physical activity daily-  goal not met; 3) Keep food diary (ex. MyFitnessPal)- goal not met; 4) 3 meals per day- goal met  Goals for next visit: 1) 1-2# weight loss per week; 2) 30 minutes physical activity daily  Recommendations: 1) Involve entire family (including dogs) in physical activity; 2) Try to eat around the same time each day; 3) Set aside time each day for exercise  F/U: 4-8 weeks. Scheduled for 01/04/13 at 1100.   Joaquim Lai, RD, LDN Date:11/29/2013 Appt EndTime: 2751

## 2013-12-27 ENCOUNTER — Other Ambulatory Visit: Payer: Self-pay

## 2013-12-27 ENCOUNTER — Telehealth: Payer: Self-pay | Admitting: Family Medicine

## 2013-12-27 DIAGNOSIS — E669 Obesity, unspecified: Secondary | ICD-10-CM

## 2013-12-27 MED ORDER — PHENTERMINE HCL 37.5 MG PO TABS: 37.5000 mg | ORAL_TABLET | Freq: Every day | ORAL | Status: DC

## 2013-12-27 NOTE — Telephone Encounter (Signed)
Med printed for signature 

## 2013-12-28 ENCOUNTER — Ambulatory Visit: Payer: Medicaid Other | Admitting: Family Medicine

## 2014-01-04 ENCOUNTER — Telehealth (HOSPITAL_COMMUNITY): Payer: Self-pay | Admitting: Dietician

## 2014-01-04 NOTE — Telephone Encounter (Signed)
Pt was a no-show for appointment scheduled for 01/04/2014 at 1100.

## 2014-01-10 ENCOUNTER — Telehealth: Payer: Self-pay

## 2014-01-10 NOTE — Telephone Encounter (Signed)
If available yes

## 2014-01-11 NOTE — Telephone Encounter (Signed)
Spoke with patient and she took first available appt for 3/17

## 2014-01-17 ENCOUNTER — Encounter: Payer: Self-pay | Admitting: Family Medicine

## 2014-01-17 ENCOUNTER — Ambulatory Visit (INDEPENDENT_AMBULATORY_CARE_PROVIDER_SITE_OTHER): Payer: Medicaid Other | Admitting: Family Medicine

## 2014-01-17 VITALS — BP 124/84 | HR 84 | Resp 18 | Ht 66.0 in | Wt 241.1 lb

## 2014-01-17 DIAGNOSIS — R7301 Impaired fasting glucose: Secondary | ICD-10-CM

## 2014-01-17 DIAGNOSIS — F3289 Other specified depressive episodes: Secondary | ICD-10-CM

## 2014-01-17 DIAGNOSIS — F329 Major depressive disorder, single episode, unspecified: Secondary | ICD-10-CM

## 2014-01-17 DIAGNOSIS — Z13220 Encounter for screening for lipoid disorders: Secondary | ICD-10-CM

## 2014-01-17 DIAGNOSIS — F32A Depression, unspecified: Secondary | ICD-10-CM

## 2014-01-17 DIAGNOSIS — R5381 Other malaise: Secondary | ICD-10-CM

## 2014-01-17 DIAGNOSIS — J309 Allergic rhinitis, unspecified: Secondary | ICD-10-CM

## 2014-01-17 DIAGNOSIS — R5383 Other fatigue: Secondary | ICD-10-CM

## 2014-01-17 DIAGNOSIS — J019 Acute sinusitis, unspecified: Secondary | ICD-10-CM | POA: Insufficient documentation

## 2014-01-17 DIAGNOSIS — E669 Obesity, unspecified: Secondary | ICD-10-CM

## 2014-01-17 MED ORDER — PHENTERMINE HCL 37.5 MG PO TABS: 37.5000 mg | ORAL_TABLET | Freq: Every day | ORAL | Status: DC

## 2014-01-17 MED ORDER — BENZONATATE 100 MG PO CAPS: 100.0000 mg | ORAL_CAPSULE | Freq: Two times a day (BID) | ORAL | Status: DC | PRN

## 2014-01-17 MED ORDER — AZITHROMYCIN 250 MG PO TABS: ORAL_TABLET | ORAL | Status: DC

## 2014-01-17 MED ORDER — FLUTICASONE PROPIONATE 50 MCG/ACT NA SUSP: 2.0000 | Freq: Every day | NASAL | Status: DC

## 2014-01-17 MED ORDER — FLUCONAZOLE 150 MG PO TABS: ORAL_TABLET | ORAL | Status: DC

## 2014-01-17 NOTE — Progress Notes (Signed)
   Subjective:    Patient ID: Stacey Palmer, female    DOB: 12/31/1980, 33 y.o.   MRN: [id number]  HPI The PT is here for follow up and re-evaluation of chronic medical conditions, medication management and review of any available recent lab and radiology data.  Preventive health is updated, specifically  Cancer screening and Immunization.    The PT denies any adverse reactions to current medications since the last visit. Unhappy with weight gain and states with the holidays , she was just not able to follow through on appropriate diet 10 day h/o facial pressure with yellow nasal drainage and chills , no fevr documented, no improvement with time    Review of Systems    See HPI  Denies chest congestion, productive cough or wheezing. Denies chest pains, palpitations and leg swelling Denies abdominal pain, nausea, vomiting,diarrhea or constipation.   Denies dysuria, frequency, hesitancy or incontinence. Denies joint pain, swelling and limitation in mobility. Denies headaches, seizures, numbness, or tingling. Denies uncontrolled depression, anxiety or insomnia.States effexor has definitely helped her depression Denies skin break down or rash.     Objective:   Physical Exam  BP 124/84  Pulse 84  Resp 18  Ht 5\' 6"  (1.676 m)  Wt 241 lb 1.9 oz (109.371 kg)  BMI 38.94 kg/m2  SpO2 98% Patient alert and oriented and in no cardiopulmonary distress.  HEENT: No facial asymmetry, EOMI,right  maxillary  sinus tenderness,  oropharynx pink and moist.  Neck supple no adenopathy.  Chest: Clear to auscultation bilaterally.  CVS: S1, S2 no murmurs, no S3.  ABD: Soft non tender. Bowel sounds normal.  Ext: No edema  MS: Adequate ROM spine, shoulders, hips and knees.  Skin: Intact, no ulcerations or rash noted.  Psych: Good eye contact, normal affect. Memory intact not anxious or depressed appearing.  CNS: CN 2-12 intact, power, tone and sensation normal throughout.         Assessment & Plan:  Acute sinusitis antibiotic course prescribed  ALLERGIC RHINITIS Uncontrolled symptoms , worse this time of the year, daily use of medication is emphasized, also saline nasal flushes as needed  Depression Improved on medication continue same.  OBESITY Deteriorated. Patient re-educated about  the importance of commitment to a  minimum of 150 minutes of exercise per week. The importance of healthy food choices with portion control discussed. Encouraged to start a food diary, count calories and to consider  joining a support group. Sample diet sheets offered. Goals set by the patient for the next several months.  inconsistent weight loss attempts, pt understands the need to lose weight to remain on phentermine

## 2014-01-17 NOTE — Patient Instructions (Addendum)
F/u in 3.5 month, cancel sooner.  Medication sent in for allergies, cough and sinusitis as discussed  Weight loss goal of 10 pound in next 3.5 month  It is important that you exercise regularly at least 30 minutes 5 times a week. If you develop chest pain, have severe difficulty breathing, or feel very tired, stop exercising immediately and seek medical attention    Fasting labs before next visit

## 2014-01-22 NOTE — Assessment & Plan Note (Signed)
antibiotic course prescribed 

## 2014-01-22 NOTE — Assessment & Plan Note (Signed)
Deteriorated. Patient re-educated about  the importance of commitment to a  minimum of 150 minutes of exercise per week. The importance of healthy food choices with portion control discussed. Encouraged to start a food diary, count calories and to consider  joining a support group. Sample diet sheets offered. Goals set by the patient for the next several months.  inconsistent weight loss attempts, pt understands the need to lose weight to remain on phentermine

## 2014-01-22 NOTE — Assessment & Plan Note (Signed)
>>  ASSESSMENT AND PLAN FOR DEPRESSION WRITTEN ON 01/22/2014  5:34 PM BY SIMPSON, MARGARET E, MD  Improved on medication continue same.

## 2014-01-22 NOTE — Assessment & Plan Note (Signed)
Uncontrolled symptoms , worse this time of the year, daily use of medication is emphasized, also saline nasal flushes as needed

## 2014-01-22 NOTE — Assessment & Plan Note (Signed)
Improved on medication continue same 

## 2014-01-26 ENCOUNTER — Telehealth (HOSPITAL_COMMUNITY): Payer: Self-pay | Admitting: Dietician

## 2014-01-26 NOTE — Telephone Encounter (Signed)
Returned multiple calls from pt on 01/24/14. Pt missed last appointment due to her personal conflicts. She has missed her last two appointments. Unable to leave a message and no answer; phone continued to ring.

## 2014-02-20 ENCOUNTER — Other Ambulatory Visit: Payer: Self-pay | Admitting: Family Medicine

## 2014-02-22 ENCOUNTER — Ambulatory Visit: Payer: Medicaid Other | Admitting: Family Medicine

## 2014-05-10 ENCOUNTER — Ambulatory Visit: Payer: Medicaid Other | Admitting: Family Medicine

## 2014-05-12 ENCOUNTER — Other Ambulatory Visit: Payer: Self-pay | Admitting: Family Medicine

## 2014-06-02 ENCOUNTER — Ambulatory Visit (INDEPENDENT_AMBULATORY_CARE_PROVIDER_SITE_OTHER): Payer: Medicaid Other

## 2014-06-02 DIAGNOSIS — Z111 Encounter for screening for respiratory tuberculosis: Secondary | ICD-10-CM

## 2014-06-02 NOTE — Progress Notes (Signed)
Patient in for PPD placement.  PPD placed in left arm.  Patient to return on Monday to have test read and receive verification paper.

## 2014-06-05 ENCOUNTER — Ambulatory Visit (INDEPENDENT_AMBULATORY_CARE_PROVIDER_SITE_OTHER): Payer: Medicaid Other | Admitting: Family Medicine

## 2014-06-05 ENCOUNTER — Encounter: Payer: Self-pay | Admitting: Family Medicine

## 2014-06-05 ENCOUNTER — Other Ambulatory Visit (HOSPITAL_COMMUNITY)
Admission: RE | Admit: 2014-06-05 | Discharge: 2014-06-05 | Disposition: A | Discharge status: Home / Self Care | Payer: Medicaid Other | Source: Ambulatory Visit | Attending: Family Medicine | Admitting: Family Medicine

## 2014-06-05 VITALS — BP 130/80 | HR 87 | Resp 16 | Ht 66.5 in | Wt 257.0 lb

## 2014-06-05 DIAGNOSIS — F3289 Other specified depressive episodes: Secondary | ICD-10-CM

## 2014-06-05 DIAGNOSIS — E669 Obesity, unspecified: Secondary | ICD-10-CM

## 2014-06-05 DIAGNOSIS — L909 Atrophic disorder of skin, unspecified: Secondary | ICD-10-CM

## 2014-06-05 DIAGNOSIS — R5381 Other malaise: Secondary | ICD-10-CM

## 2014-06-05 DIAGNOSIS — Z113 Encounter for screening for infections with a predominantly sexual mode of transmission: Secondary | ICD-10-CM

## 2014-06-05 DIAGNOSIS — J3089 Other allergic rhinitis: Secondary | ICD-10-CM

## 2014-06-05 DIAGNOSIS — L918 Other hypertrophic disorders of the skin: Secondary | ICD-10-CM

## 2014-06-05 DIAGNOSIS — Z13220 Encounter for screening for lipoid disorders: Secondary | ICD-10-CM

## 2014-06-05 DIAGNOSIS — F32A Depression, unspecified: Secondary | ICD-10-CM

## 2014-06-05 DIAGNOSIS — R5383 Other fatigue: Secondary | ICD-10-CM

## 2014-06-05 DIAGNOSIS — N760 Acute vaginitis: Secondary | ICD-10-CM | POA: Diagnosis present

## 2014-06-05 DIAGNOSIS — J309 Allergic rhinitis, unspecified: Secondary | ICD-10-CM | POA: Insufficient documentation

## 2014-06-05 DIAGNOSIS — L919 Hypertrophic disorder of the skin, unspecified: Secondary | ICD-10-CM

## 2014-06-05 DIAGNOSIS — F329 Major depressive disorder, single episode, unspecified: Secondary | ICD-10-CM

## 2014-06-05 HISTORY — DX: Allergic rhinitis, unspecified: J30.9

## 2014-06-05 HISTORY — DX: Other hypertrophic disorders of the skin: L91.8

## 2014-06-05 LAB — TB SKIN TEST
Induration: 0 mm
TB SKIN TEST: NEGATIVE

## 2014-06-05 MED ORDER — PHENTERMINE HCL 37.5 MG PO TABS: 37.5000 mg | ORAL_TABLET | Freq: Every day | ORAL | Status: DC

## 2014-06-05 NOTE — Assessment & Plan Note (Signed)
Present in left axilla, requests removal, refer to gen surg  For same

## 2014-06-05 NOTE — Patient Instructions (Signed)
F/u in 4 month, call if you need me before  You are refered to gen surgeon DrJenkins for removal of skin tag under left arm  Swabs are sent for testing you will be contacted with results  Resume phentermine HALF daily, weight loss goal of 4 pounds per month  Commit to daily exercise and change to healthy diet  CBC, fasting lipiod, chem 7, HBA1C and tSH as soon as possible, past due

## 2014-06-05 NOTE — Assessment & Plan Note (Signed)
Swabs sent for testing, will treat based on results

## 2014-06-05 NOTE — Assessment & Plan Note (Signed)
>>  ASSESSMENT AND PLAN FOR DEPRESSION WRITTEN ON 06/05/2014  8:23 PM BY SIMPSON, MARGARET E, MD  Controlled, no change in medication

## 2014-06-05 NOTE — Assessment & Plan Note (Signed)
Deteriorated. Patient re-educated about  the importance of commitment to a  minimum of 150 minutes of exercise per week. The importance of healthy food choices with portion control discussed. Encouraged to start a food diary, count calories and to consider  joining a support group. Sample diet sheets offered. Goals set by the patient for the next several months.  Resume phentermine half daily

## 2014-06-05 NOTE — Assessment & Plan Note (Signed)
Controlled, no change in medication  

## 2014-06-05 NOTE — Progress Notes (Signed)
   Subjective:    Patient ID: Stacey Palmer, female    DOB: 04/14/1981, 33 y.o.   MRN: [id number]  HPI The PT is here for follow up and re-evaluation of chronic medical conditions, medication management and review of any available recent lab and radiology data.  Preventive health is updated, specifically  Cancer screening and Immunization.    The PT denies any adverse reactions to current medications since the last visit.  2 day h/o foul smelling , itchy vaginal discharge, she does not douche C/o weight gain off phentermine, intends to re commit to weigh loss through lifestyle change and wants help with this Good response to depression and allergy meds, no adverse s/e wants to continue     Review of Systems See HPI Denies recent fever or chills. Denies sinus pressure, nasal congestion, ear pain or sore throat. Denies chest congestion, productive cough or wheezing. Denies chest pains, palpitations and leg swelling Denies abdominal pain, nausea, vomiting,diarrhea or constipation.   Denies dysuria, frequency, hesitancy or incontinence. Denies joint pain, swelling and limitation in mobility. Denies headaches, seizures, numbness, or tingling. Denies uncontrolled  depression, anxiety or insomnia. Denies skin break down or rash.        Objective:   Physical Exam BP 130/80  Pulse 87  Resp 16  Ht 5' 6.5" (1.689 m)  Wt 257 lb (116.574 kg)  BMI 40.86 kg/m2  SpO2 98% Patient alert and oriented and in no cardiopulmonary distress.  HEENT: No facial asymmetry, EOMI,   oropharynx pink and moist.  Neck supple no JVD, no mass.  Chest: Clear to auscultation bilaterally.  CVS: S1, S2 no murmurs, no S3.Regular rate.  ABD: Soft non tender.  Pelvic: vaginal mucosa erythematous,  On tender, no ulcerated lesions or warts noted. White vaginal discharge in vault and from os, no cervical motion ar adnexal  Ext: No edema  MS: Adequate ROM spine, shoulders, hips and knees.  Skin: Intact, no  ulcerations or rash noted.large skin tag , loosely atatched in left axilla, no erythema or warmth  Psych: Good eye contact, normal affect. Memory intact not anxious or depressed appearing.  CNS: CN 2-12 intact, power,  normal throughout.no focal deficits noted.        Assessment & Plan:  Vaginitis and vulvovaginitis Swabs sent for testing, will treat based on results  OBESITY Deteriorated. Patient re-educated about  the importance of commitment to a  minimum of 150 minutes of exercise per week. The importance of healthy food choices with portion control discussed. Encouraged to start a food diary, count calories and to consider  joining a support group. Sample diet sheets offered. Goals set by the patient for the next several months.  Resume phentermine half daily   Skin tag Present in left axilla, requests removal, refer to gen surg  For same  Depression Controlled, no change in medication   Allergic rhinitis Controlled, no change in medication

## 2014-06-06 NOTE — Addendum Note (Signed)
Addended by: Kandis FantasiaSLADE, COURTNEY B on: 06/06/2014 09:53 AM   Modules accepted: Orders

## 2014-06-12 ENCOUNTER — Ambulatory Visit: Payer: Medicaid Other | Admitting: Family Medicine

## 2014-06-17 LAB — CBC
HEMATOCRIT: 39.9 % (ref 36.0–46.0)
HEMOGLOBIN: 13.8 g/dL (ref 12.0–15.0)
MCH: 32 pg (ref 26.0–34.0)
MCHC: 34.6 g/dL (ref 30.0–36.0)
MCV: 92.6 fL (ref 78.0–100.0)
Platelets: 311 10*3/uL (ref 150–400)
RBC: 4.31 MIL/uL (ref 3.87–5.11)
RDW: 13 % (ref 11.5–15.5)
WBC: 4.3 10*3/uL (ref 4.0–10.5)

## 2014-06-17 LAB — BASIC METABOLIC PANEL
BUN: 12 mg/dL (ref 6–23)
CHLORIDE: 103 meq/L (ref 96–112)
CO2: 27 mEq/L (ref 19–32)
Calcium: 9.2 mg/dL (ref 8.4–10.5)
Creat: 0.76 mg/dL (ref 0.50–1.10)
GLUCOSE: 80 mg/dL (ref 70–99)
POTASSIUM: 4.2 meq/L (ref 3.5–5.3)
Sodium: 136 mEq/L (ref 135–145)

## 2014-06-17 LAB — LIPID PANEL
Cholesterol: 165 mg/dL (ref 0–200)
HDL: 60 mg/dL (ref >39)
LDL CALC: 88 mg/dL (ref 0–99)
Total CHOL/HDL Ratio: 2.8 Ratio
Triglycerides: 85 mg/dL (ref <150)
VLDL: 17 mg/dL (ref 0–40)

## 2014-06-17 LAB — HEMOGLOBIN A1C
HEMOGLOBIN A1C: 5.4 % (ref <5.7)
Mean Plasma Glucose: 108 mg/dL (ref <117)

## 2014-06-17 LAB — TSH: TSH: 1.452 u[IU]/mL (ref 0.350–4.500)

## 2014-06-17 LAB — RPR

## 2014-06-17 LAB — HIV ANTIBODY (ROUTINE TESTING W REFLEX): HIV: NONREACTIVE

## 2014-06-20 ENCOUNTER — Other Ambulatory Visit: Payer: Self-pay | Admitting: Family Medicine

## 2014-06-20 ENCOUNTER — Encounter: Payer: Self-pay | Admitting: Family Medicine

## 2014-06-20 DIAGNOSIS — B009 Herpesviral infection, unspecified: Secondary | ICD-10-CM

## 2014-06-20 HISTORY — DX: Herpesviral infection, unspecified: B00.9

## 2014-06-20 LAB — HSV 2 ANTIBODY, IGG: HSV 2 GLYCOPROTEIN G AB, IGG: 6.02 IV — AB

## 2014-06-21 ENCOUNTER — Other Ambulatory Visit: Payer: Self-pay

## 2014-06-21 ENCOUNTER — Telehealth: Payer: Self-pay

## 2014-06-21 MED ORDER — ACYCLOVIR 200 MG PO CAPS: 200.0000 mg | ORAL_CAPSULE | Freq: Every day | ORAL | Status: DC

## 2014-08-10 ENCOUNTER — Other Ambulatory Visit: Payer: Self-pay | Admitting: Family Medicine

## 2014-11-06 ENCOUNTER — Other Ambulatory Visit: Payer: Self-pay | Admitting: Family Medicine

## 2014-12-04 ENCOUNTER — Other Ambulatory Visit: Payer: Self-pay | Admitting: Family Medicine

## 2015-01-01 ENCOUNTER — Other Ambulatory Visit: Payer: Self-pay | Admitting: Family Medicine

## 2015-02-05 ENCOUNTER — Other Ambulatory Visit: Payer: Self-pay | Admitting: Family Medicine

## 2015-06-05 ENCOUNTER — Ambulatory Visit: Payer: Medicaid Other | Admitting: Family Medicine

## 2015-06-11 ENCOUNTER — Ambulatory Visit (INDEPENDENT_AMBULATORY_CARE_PROVIDER_SITE_OTHER): Payer: Managed Care, Other (non HMO) | Admitting: Family Medicine

## 2015-06-11 ENCOUNTER — Encounter: Payer: Self-pay | Admitting: Family Medicine

## 2015-06-11 VITALS — BP 128/82 | HR 84 | Resp 18 | Ht 66.5 in | Wt 270.0 lb

## 2015-06-11 DIAGNOSIS — E559 Vitamin D deficiency, unspecified: Secondary | ICD-10-CM

## 2015-06-11 DIAGNOSIS — G479 Sleep disorder, unspecified: Secondary | ICD-10-CM | POA: Diagnosis not present

## 2015-06-11 DIAGNOSIS — Z139 Encounter for screening, unspecified: Secondary | ICD-10-CM

## 2015-06-11 DIAGNOSIS — E669 Obesity, unspecified: Secondary | ICD-10-CM

## 2015-06-11 DIAGNOSIS — R0683 Snoring: Secondary | ICD-10-CM | POA: Diagnosis not present

## 2015-06-11 DIAGNOSIS — M79671 Pain in right foot: Secondary | ICD-10-CM

## 2015-06-11 DIAGNOSIS — J3089 Other allergic rhinitis: Secondary | ICD-10-CM

## 2015-06-11 DIAGNOSIS — M79672 Pain in left foot: Secondary | ICD-10-CM

## 2015-06-11 DIAGNOSIS — G4730 Sleep apnea, unspecified: Secondary | ICD-10-CM | POA: Insufficient documentation

## 2015-06-11 DIAGNOSIS — F329 Major depressive disorder, single episode, unspecified: Secondary | ICD-10-CM

## 2015-06-11 DIAGNOSIS — F32A Depression, unspecified: Secondary | ICD-10-CM

## 2015-06-11 HISTORY — DX: Obesity, unspecified: E66.9

## 2015-06-11 LAB — CBC WITH DIFFERENTIAL/PLATELET
Basophils Absolute: 0 10*3/uL (ref 0.0–0.1)
Basophils Relative: 0 % (ref 0–1)
Eosinophils Absolute: 0.1 10*3/uL (ref 0.0–0.7)
Eosinophils Relative: 2 % (ref 0–5)
HEMATOCRIT: 34.7 % — AB (ref 36.0–46.0)
HEMOGLOBIN: 12.4 g/dL (ref 12.0–15.0)
LYMPHS PCT: 37 % (ref 12–46)
Lymphs Abs: 2.1 10*3/uL (ref 0.7–4.0)
MCH: 32.6 pg (ref 26.0–34.0)
MCHC: 35.7 g/dL (ref 30.0–36.0)
MCV: 91.3 fL (ref 78.0–100.0)
MPV: 9.5 fL (ref 8.6–12.4)
Monocytes Absolute: 0.5 10*3/uL (ref 0.1–1.0)
Monocytes Relative: 8 % (ref 3–12)
Neutro Abs: 3 10*3/uL (ref 1.7–7.7)
Neutrophils Relative %: 53 % (ref 43–77)
Platelets: 321 10*3/uL (ref 150–400)
RBC: 3.8 MIL/uL — ABNORMAL LOW (ref 3.87–5.11)
RDW: 12.9 % (ref 11.5–15.5)
WBC: 5.7 10*3/uL (ref 4.0–10.5)

## 2015-06-11 LAB — LIPID PANEL
Cholesterol: 167 mg/dL (ref 125–200)
HDL: 55 mg/dL (ref >=46)
LDL Cholesterol: 97 mg/dL (ref <130)
Total CHOL/HDL Ratio: 3 Ratio (ref <=5.0)
Triglycerides: 73 mg/dL (ref <150)
VLDL: 15 mg/dL (ref <30)

## 2015-06-11 LAB — COMPLETE METABOLIC PANEL WITH GFR
ALBUMIN: 3.5 g/dL — AB (ref 3.6–5.1)
ALT: 12 U/L (ref 6–29)
AST: 12 U/L (ref 10–30)
Alkaline Phosphatase: 60 U/L (ref 33–115)
BILIRUBIN TOTAL: 0.6 mg/dL (ref 0.2–1.2)
BUN: 8 mg/dL (ref 7–25)
CHLORIDE: 105 mmol/L (ref 98–110)
CO2: 26 mmol/L (ref 20–31)
Calcium: 8.8 mg/dL (ref 8.6–10.2)
Creat: 0.66 mg/dL (ref 0.50–1.10)
GFR, Est African American: >89 mL/min (ref >=60)
GFR, Est Non African American: >89 mL/min (ref >=60)
GLUCOSE: 82 mg/dL (ref 65–99)
Potassium: 4.1 mmol/L (ref 3.5–5.3)
SODIUM: 139 mmol/L (ref 135–146)
TOTAL PROTEIN: 6.6 g/dL (ref 6.1–8.1)

## 2015-06-11 LAB — HEMOGLOBIN A1C
Hgb A1c MFr Bld: 5.4 % (ref <5.7)
Mean Plasma Glucose: 108 mg/dL (ref <117)

## 2015-06-11 LAB — TSH: TSH: 2.028 u[IU]/mL (ref 0.350–4.500)

## 2015-06-11 MED ORDER — PHENTERMINE HCL 37.5 MG PO TABS: 37.5000 mg | ORAL_TABLET | Freq: Every day | ORAL | Status: DC

## 2015-06-11 MED ORDER — AZELASTINE HCL 0.1 % NA SOLN: 2.0000 | Freq: Two times a day (BID) | NASAL | Status: DC

## 2015-06-11 MED ORDER — MONTELUKAST SODIUM 10 MG PO TABS: 10.0000 mg | ORAL_TABLET | Freq: Every day | ORAL | Status: DC

## 2015-06-11 NOTE — Assessment & Plan Note (Signed)
6 month h/o excess fatigue, headache, excess daytime sleepiness, needs sleep eval

## 2015-06-11 NOTE — Assessment & Plan Note (Signed)
Bilateral foot pai up to a 6 , at end of the day worsening in past 6 month

## 2015-06-11 NOTE — Patient Instructions (Addendum)
F/u in 2 months, call if you need me before  You are referred  for sleep evaluation for sleep apnea, Dr Merlene Laughter, and to bariatric surgeon for evaluation for weight loss  It is important that you exercise regularly at least 30 minutes 7 times a week. If you develop chest pain, have severe difficulty breathing, or feel very tired, stop exercising immediately and seek medical attention   Stop sugary drinks, and PLEASE change sweet foods to  Fruit , fresh/ frozen, NO FAST FOOD, unless salad  Weight los goal of 4 pounds per month, you will start half phentermine daily  CBC, lipid, cmp and EGFr, TSH, HBA1C and vit D today   Medication for allergies is singulair one daily and astellin nasal spray  For help with weight management , phentermine HALF daily

## 2015-06-12 ENCOUNTER — Other Ambulatory Visit: Payer: Self-pay

## 2015-06-12 LAB — VITAMIN D 25 HYDROXY (VIT D DEFICIENCY, FRACTURES): Vit D, 25-Hydroxy: 12 ng/mL — ABNORMAL LOW (ref 30–100)

## 2015-06-12 MED ORDER — VITAMIN D (ERGOCALCIFEROL) 1.25 MG (50000 UNIT) PO CAPS: 50000.0000 [IU] | ORAL_CAPSULE | ORAL | Status: DC

## 2015-06-26 ENCOUNTER — Telehealth: Payer: Self-pay | Admitting: *Deleted

## 2015-06-26 NOTE — Telephone Encounter (Signed)
Pt has a appt with Dr. Gerilyn Pilgrim on 07/31/15 at 10:00

## 2015-06-30 NOTE — Assessment & Plan Note (Signed)
Not suicidal or homicidal  start effexor, and commit to regualrexercise

## 2015-06-30 NOTE — Assessment & Plan Note (Signed)
Deteriorated. Refer to surgery for consideration for bariatric surgery, has had no success with weight management with lifestyle modification Patient re-educated about  the importance of commitment to a  minimum of 150 minutes of exercise per week.  The importance of healthy food choices with portion control discussed. Encouraged to start a food diary, count calories and to consider  joining a support group. Sample diet sheets offered. Goals set by the patient for the next several months. Start phentermine half daily   Weight /BMI 06/11/2015 06/05/2014 01/17/2014  WEIGHT 270 lb 257 lb 241 lb 1.9 oz  HEIGHT 5' 6.5" 5' 6.5"   BMI 42.93 kg/m2 40.86 kg/m2 38.94 kg/m2    Current exercise per week 90  minutes.

## 2015-06-30 NOTE — Assessment & Plan Note (Signed)
Uncontrolled, needs to commit to daily maintainance medication Education done

## 2015-06-30 NOTE — Progress Notes (Signed)
   Subjective:    Patient ID: Stacey Palmer, female    DOB: 10/20/1981, 34 y.o.   MRN: [id number]  HPI The PT is here for follow up and re-evaluation of chronic medical conditions, medication management and review of any available recent lab and radiology data.  Preventive health is updated, specifically  Cancer screening and Immunization.   C/o increased nasal congestion, clear drainage and excess sneezing, no recent fever or cills, nocturnal cough present C/o increased depression and frustration as she continues to gain weight, feels tired all the time, chronic daily headaches , and wakes herself up  snoring at times    Review of Systems See HPI  Denies chest pains, palpitations and leg swelling Denies abdominal pain, nausea, vomiting,diarrhea or constipation.   Denies dysuria, frequency, hesitancy or incontinence. Denies joint pain, swelling and limitation in mobility. Denies headaches, seizures, numbness, or tingling. Denies skin break down or rash.        Objective:   Physical Exam  BP 128/82 mmHg  Pulse 84  Resp 18  Ht 5' 6.5" (1.689 m)  Wt 270 lb (122.471 kg)  BMI 42.93 kg/m2  SpO2 98% Patient alert and oriented and in no cardiopulmonary distress.  HEENT: No facial asymmetry, EOMI,   oropharynx pink and moist.  Neck supple no JVD, no mass. TM clear, nasal mucosa erythematous and edematous, bilateral conjunctival injection Chest: Clear to auscultation bilaterally.  CVS: S1, S2 no murmurs, no S3.Regular rate.  ABD: Soft non tender.   Ext: No edema  MS: Adequate ROM spine, shoulders, hips and knees.  Skin: Intact, no ulcerations or rash noted.  Psych: Good eye contact, flat affect. Memory intact not anxious at times tearful and  depressed appearing.  CNS: CN 2-12 intact, power,  normal throughout.no focal deficits noted.       Assessment & Plan:  Snoring 6 month h/o excess fatigue, headache, excess daytime sleepiness, needs sleep eval  Foot pain,  bilateral Bilateral foot pai up to a 6 , at end of the day worsening in past 6 month  Morbid obesity Deteriorated. Refer to surgery for consideration for bariatric surgery, has had no success with weight management with lifestyle modification Patient re-educated about  the importance of commitment to a  minimum of 150 minutes of exercise per week.  The importance of healthy food choices with portion control discussed. Encouraged to start a food diary, count calories and to consider  joining a support group. Sample diet sheets offered. Goals set by the patient for the next several months. Start phentermine half daily   Weight /BMI 06/11/2015 06/05/2014 01/17/2014  WEIGHT 270 lb 257 lb 241 lb 1.9 oz  HEIGHT 5' 6.5" 5' 6.5"   BMI 42.93 kg/m2 40.86 kg/m2 38.94 kg/m2    Current exercise per week 90  minutes.   Depression Not suicidal or homicidal  start effexor, and commit to regualrexercise  Allergic rhinitis Uncontrolled, needs to commit to daily maintainance medication Education done

## 2015-06-30 NOTE — Assessment & Plan Note (Signed)
>>  ASSESSMENT AND PLAN FOR DEPRESSION WRITTEN ON 06/30/2015 10:35 PM BY SIMPSON, MARGARET E, MD  Not suicidal or homicidal  start effexor, and commit to regualrexercise

## 2015-07-19 ENCOUNTER — Telehealth: Payer: Self-pay | Admitting: *Deleted

## 2015-07-19 DIAGNOSIS — M79672 Pain in left foot: Principal | ICD-10-CM

## 2015-07-19 DIAGNOSIS — M79671 Pain in right foot: Secondary | ICD-10-CM

## 2015-07-19 NOTE — Addendum Note (Signed)
Addended by: Kandis Fantasia B on: 07/19/2015 01:48 PM   Modules accepted: Orders

## 2015-07-19 NOTE — Telephone Encounter (Signed)
Patient called wanting to speak with Stacey Palmer about a referral to the foot doctor, she would like to go somewhere in [place], I do know [facility] in [place] are not accepting new patients but the [place] office is. Pt also had questions about a weight loss surgery. [phone] or [phone] pt has to go to work at 1 today

## 2015-07-19 NOTE — Telephone Encounter (Signed)
Patient would like to see podiatry for bilateral foot pain. Is this ok?

## 2015-07-19 NOTE — Telephone Encounter (Signed)
pls refer I will sign 

## 2015-07-19 NOTE — Telephone Encounter (Signed)
Referral entered.  Patient will go to [facility]

## 2015-08-23 ENCOUNTER — Encounter: Payer: Self-pay | Admitting: Family Medicine

## 2015-08-23 ENCOUNTER — Ambulatory Visit (INDEPENDENT_AMBULATORY_CARE_PROVIDER_SITE_OTHER): Payer: Managed Care, Other (non HMO) | Admitting: Family Medicine

## 2015-08-23 VITALS — BP 112/80 | HR 72 | Temp 96.2°F | Resp 16 | Ht 66.0 in | Wt 264.0 lb

## 2015-08-23 DIAGNOSIS — F329 Major depressive disorder, single episode, unspecified: Secondary | ICD-10-CM | POA: Diagnosis not present

## 2015-08-23 DIAGNOSIS — J018 Other acute sinusitis: Secondary | ICD-10-CM

## 2015-08-23 DIAGNOSIS — J309 Allergic rhinitis, unspecified: Secondary | ICD-10-CM | POA: Diagnosis not present

## 2015-08-23 DIAGNOSIS — M79671 Pain in right foot: Secondary | ICD-10-CM

## 2015-08-23 DIAGNOSIS — J0140 Acute pansinusitis, unspecified: Secondary | ICD-10-CM | POA: Insufficient documentation

## 2015-08-23 DIAGNOSIS — M79672 Pain in left foot: Secondary | ICD-10-CM

## 2015-08-23 DIAGNOSIS — J019 Acute sinusitis, unspecified: Secondary | ICD-10-CM | POA: Insufficient documentation

## 2015-08-23 DIAGNOSIS — F32A Depression, unspecified: Secondary | ICD-10-CM

## 2015-08-23 MED ORDER — FLUCONAZOLE 150 MG PO TABS: 150.0000 mg | ORAL_TABLET | Freq: Once | ORAL | Status: DC

## 2015-08-23 MED ORDER — AZITHROMYCIN 250 MG PO TABS: ORAL_TABLET | ORAL | Status: DC

## 2015-08-23 NOTE — Progress Notes (Signed)
   Stacey Palmer     MRN: [id number]      DOB: November 27, 1980   HPI Stacey Palmer is here for follow up and re-evaluation of chronic medical conditions, medication management and review of any available recent lab and radiology data.  Preventive health is updated, specifically  Cancer screening and Immunization.   Questions or concerns regarding consultations or procedures which the PT has had in the interim are  addressed. The PT denies any adverse reactions to current medications since the last visit.  3 day h/o sinus and ear pressure, sore throat and coughh, chills and body aches Intermittent cough with yellow sputum, also yellow nasal drainage. Has not worked for past 2 days  ROS  Denies chest pains, palpitations and leg swelling Denies abdominal pain, nausea, vomiting,diarrhea or constipation.   Denies dysuria, frequency, hesitancy or incontinence. Denies joint pain, swelling and limitation in mobility. Denies headaches, seizures, numbness, or tingling. Denies depression, anxiety or insomnia. Denies skin break down or rash.   PE  BP 112/80 mmHg  Pulse 72  Temp(Src) 96.2 F (35.7 C)  Resp 16  Ht 5\' 6"  (1.676 m)  Wt 264 lb (119.75 kg)  BMI 42.63 kg/m2  SpO2 97%  Patient alert and oriented and in no cardiopulmonary distress.  HEENT: No facial asymmetry, EOMI,   oropharynx pink and moist.  Neck supple no JVD, no mass.frontal and maxillary sinus pressure, TM clear bilaterally  Chest: Clear to auscultation bilaterally.  CVS: S1, S2 no murmurs, no S3.Regular rate.  ABD: Soft non tender.   Ext: No edema  MS: Adequate ROM spine, shoulders, hips and knees.  Skin: Intact, no ulcerations or rash noted.  Psych: Good eye contact, normal affect. Memory intact not anxious or depressed appearing.  CNS: CN 2-12 intact, power,  normal throughout.no focal deficits noted.   Assessment & Plan   Acute sinusitis z pack prescribed, work excuse from 10/18/. To return 08/24/2015  Morbid  obesity Improved, plans to get bariatric surgery Patient re-educated about  the importance of commitment to a  minimum of 150 minutes of exercise per week.  The importance of healthy food choices with portion control discussed. Encouraged to start a food diary, count calories and to consider  joining a support group. Sample diet sheets offered. Goals set by the patient for the next several months.   Weight /BMI 08/23/2015 06/11/2015 06/05/2014  WEIGHT 264 lb 270 lb 257 lb  HEIGHT 5\' 6"  5' 6.5" 5' 6.5"  BMI 42.63 kg/m2 42.93 kg/m2 40.86 kg/m2    Current exercise per week 90 minutes.   Depression Improved and controlled , continue current med  FATIGUE Still trying to get sleep study , needs to address health ins challenge  Allergic rhinitis Controlled, no change in medication   Foot pain, bilateral Improved following injections by podiatry

## 2015-08-23 NOTE — Assessment & Plan Note (Signed)
Improved and controlled , continue current med

## 2015-08-23 NOTE — Patient Instructions (Signed)
F/u in 3 month, call if you need me before  You are  treated  For acute sinusitis, med sent in , also fluconazole # 1  Tab  Congrats on 6 pound weight loss  Work excuse to from Tuesday , to return tomorrow

## 2015-08-23 NOTE — Assessment & Plan Note (Signed)
Still trying to get sleep study , needs to address health ins challenge

## 2015-08-23 NOTE — Assessment & Plan Note (Signed)
>>  ASSESSMENT AND PLAN FOR DEPRESSION WRITTEN ON 08/23/2015  8:56 PM BY SIMPSON, MARGARET E, MD  Improved and controlled , continue current med

## 2015-08-23 NOTE — Assessment & Plan Note (Signed)
Controlled, no change in medication  

## 2015-08-23 NOTE — Assessment & Plan Note (Signed)
Improved, plans to get bariatric surgery Patient re-educated about  the importance of commitment to a  minimum of 150 minutes of exercise per week.  The importance of healthy food choices with portion control discussed. Encouraged to start a food diary, count calories and to consider  joining a support group. Sample diet sheets offered. Goals set by the patient for the next several months.   Weight /BMI 08/23/2015 06/11/2015 06/05/2014  WEIGHT 264 lb 270 lb 257 lb  HEIGHT 5\' 6"  5' 6.5" 5' 6.5"  BMI 42.63 kg/m2 42.93 kg/m2 40.86 kg/m2    Current exercise per week 90 minutes.

## 2015-08-23 NOTE — Assessment & Plan Note (Signed)
Improved following injections by podiatry

## 2015-08-23 NOTE — Assessment & Plan Note (Signed)
z pack prescribed, work excuse from 10/18/. To return 08/24/2015

## 2015-08-29 ENCOUNTER — Ambulatory Visit: Payer: Managed Care, Other (non HMO) | Admitting: Family Medicine

## 2015-11-08 ENCOUNTER — Telehealth: Payer: Self-pay | Admitting: Family Medicine

## 2015-11-08 MED ORDER — VENLAFAXINE HCL 37.5 MG PO TABS: ORAL_TABLET | ORAL | Status: DC

## 2015-11-08 NOTE — Telephone Encounter (Signed)
Med reflled   

## 2015-11-08 NOTE — Telephone Encounter (Signed)
Patient is asking for a refill on medication on her Anxiety Pill venlafaxine (EFFEXOR) 37.5 MG tablet , please advise?

## 2015-11-15 ENCOUNTER — Telehealth: Payer: Self-pay | Admitting: Family Medicine

## 2015-11-15 NOTE — Telephone Encounter (Signed)
Three office viosits since 06/2014, needs to sched and keep appt before this can be done

## 2015-11-15 NOTE — Telephone Encounter (Signed)
Please advise 

## 2015-11-15 NOTE — Telephone Encounter (Signed)
Message left to keep next appt this month before letter can be written

## 2015-11-15 NOTE — Telephone Encounter (Signed)
Celine MansSonja is asking for Dr. Lodema HongSimpson to [facility] that she is ok to go through with the Bariatric Surgery, please advise?

## 2015-11-28 ENCOUNTER — Encounter: Payer: Self-pay | Admitting: Family Medicine

## 2015-11-28 ENCOUNTER — Ambulatory Visit: Payer: Managed Care, Other (non HMO) | Admitting: Family Medicine

## 2015-12-14 ENCOUNTER — Other Ambulatory Visit: Payer: Self-pay | Admitting: Family Medicine

## 2016-01-24 ENCOUNTER — Encounter: Payer: Self-pay | Admitting: Family Medicine

## 2016-01-24 ENCOUNTER — Ambulatory Visit: Payer: Managed Care, Other (non HMO) | Admitting: Family Medicine

## 2016-01-24 ENCOUNTER — Ambulatory Visit (INDEPENDENT_AMBULATORY_CARE_PROVIDER_SITE_OTHER): Payer: Managed Care, Other (non HMO) | Admitting: Family Medicine

## 2016-01-24 VITALS — BP 120/82 | HR 60 | Temp 98.5°F | Resp 16 | Ht 64.0 in | Wt 274.0 lb

## 2016-01-24 DIAGNOSIS — G4730 Sleep apnea, unspecified: Secondary | ICD-10-CM

## 2016-01-24 DIAGNOSIS — H6691 Otitis media, unspecified, right ear: Secondary | ICD-10-CM | POA: Insufficient documentation

## 2016-01-24 DIAGNOSIS — H6501 Acute serous otitis media, right ear: Secondary | ICD-10-CM

## 2016-01-24 DIAGNOSIS — F32A Depression, unspecified: Secondary | ICD-10-CM

## 2016-01-24 DIAGNOSIS — J3089 Other allergic rhinitis: Secondary | ICD-10-CM

## 2016-01-24 DIAGNOSIS — F329 Major depressive disorder, single episode, unspecified: Secondary | ICD-10-CM

## 2016-01-24 DIAGNOSIS — J0100 Acute maxillary sinusitis, unspecified: Secondary | ICD-10-CM

## 2016-01-24 MED ORDER — FLUCONAZOLE 150 MG PO TABS: ORAL_TABLET | ORAL | Status: DC

## 2016-01-24 MED ORDER — PENICILLIN V POTASSIUM 500 MG PO TABS: 500.0000 mg | ORAL_TABLET | Freq: Three times a day (TID) | ORAL | Status: DC

## 2016-01-24 NOTE — Patient Instructions (Signed)
F/u in 6 month, call if you need me before  All the best with upcoming surgery  You are treated for right ear infection and sinus infection  Commit to dail use of allergy meds  Please work on good  health habits so that your health will improve. 1. Commitment to daily physical activity for 30 to 60  minutes, if you are able to do this.  2. Commitment to wise food choices. Aim for half of your  food intake to be vegetable and fruit, one quarter starchy foods, and one quarter protein. Try to eat on a regular schedule  3 meals per day, snacking between meals should be limited to vegetables or fruits or small portions of nuts. 64 ounces of water per day is generally recommended, unless you have specific health conditions, like heart failure or kidney failure where you will need to limit fluid intake.  3. Commitment to sufficient and a  good quality of physical and mental rest daily, generally between 6 to 8 hours per day.  WITH PERSISTANCE AND PERSEVERANCE, THE IMPOSSIBLE , BECOMES THE NORM!

## 2016-01-24 NOTE — Progress Notes (Signed)
   Subjective:    Patient ID: Stacey Palmer, female    DOB: 03/11/1981, 35 y.o.   MRN: [id number]  HPI   Stacey Palmer     MRN: [id number]      DOB: 03/11/1981   HPI Stacey Palmer is here for follow up and re-evaluation of chronic medical conditions, medication management and review of any available recent lab and radiology data.  Preventive health is updated, specifically  Cancer screening and Immunization.   Questions or concerns regarding consultations or procedures which the PT has had in the interim are  addressed. The PT denies any adverse reactions to current medications since the last visit.  2 day h/o right ear pain and right sinus pressure over cheek with yellow drainage which is bloody  ROS  Denies chest congestion, productive cough or wheezing. Denies chest pains, palpitations and leg swelling Denies abdominal pain, nausea, vomiting,diarrhea or constipation.   Denies dysuria, frequency, hesitancy or incontinence. Denies joint pain, swelling and limitation in mobility. Denies headaches, seizures, numbness, or tingling. Denies depression, anxiety or insomnia. Denies skin break down or rash.   PE  BP 120/82 mmHg  Pulse 60  Temp(Src) 98.5 F (36.9 C) (Oral)  Resp 16  Ht 5\' 4"  (1.626 m)  Wt 274 lb (124.286 kg)  BMI 47.01 kg/m2  SpO2 100%  Patient alert and oriented and in no cardiopulmonary distress.  HEENT: No facial asymmetry, EOMI,   oropharynx pink and moist.  Neck supple no JVD, right anterior cervical adenitis, right maxillary tenderness, right tM erythematous and dull, left TM normal  Chest: Clear to auscultation bilaterally.  CVS: S1, S2 no murmurs, no S3.Regular rate.  ABD: Soft non tender.   Ext: No edema  MS: Adequate ROM spine, shoulders, hips and knees.  Skin: Intact, no ulcerations or rash noted.  Psych: Good eye contact, normal affect. Memory intact not anxious or depressed appearing.  CNS: CN 2-12 intact, power,  normal throughout.no focal  deficits noted.   Assessment & Plan   ROM (right otitis media) Acute infection, penicillin prescribed  Acute sinusitis Right maxillary sinusitis, penicillin prescribed  Allergic rhinitis Increased symptoms with change in season, pt to commit to daily medication  Depression Controlled, no change in medication   Morbid obesity Deteriorated. Patient re-educated about  the importance of commitment to a  minimum of 150 minutes of exercise per week.  The importance of healthy food choices with portion control discussed. Encouraged to start a food diary, count calories and to consider  joining a support group. Sample diet sheets offered. Goals set by the patient for the next several months.   Weight /BMI 01/24/2016 08/23/2015 06/11/2015  WEIGHT 274 lb 264 lb 270 lb  HEIGHT 5\' 4"  5\' 6"  5' 6.5"  BMI 47.01 kg/m2 42.63 kg/m2 42.93 kg/m2    Plans to have bariatric surgery in next 3 months, she needs this, unsuccessful attempts at weight loss with continued weight gain   Sleep apnea Reports formal diagnosis in 2017 through bariatric center      Review of Systems     Objective:   Physical Exam        Assessment & Plan:

## 2016-01-25 NOTE — Assessment & Plan Note (Signed)
Deteriorated. Patient re-educated about  the importance of commitment to a  minimum of 150 minutes of exercise per week.  The importance of healthy food choices with portion control discussed. Encouraged to start a food diary, count calories and to consider  joining a support group. Sample diet sheets offered. Goals set by the patient for the next several months.   Weight /BMI 01/24/2016 08/23/2015 06/11/2015  WEIGHT 274 lb 264 lb 270 lb  HEIGHT 5\' 4"  5\' 6"  5' 6.5"  BMI 47.01 kg/m2 42.63 kg/m2 42.93 kg/m2    Plans to have bariatric surgery in next 3 months, she needs this, unsuccessful attempts at weight loss with continued weight gain

## 2016-01-25 NOTE — Assessment & Plan Note (Signed)
Right maxillary sinusitis, penicillin prescribed

## 2016-01-25 NOTE — Assessment & Plan Note (Signed)
Acute infection, penicillin prescribed 

## 2016-01-25 NOTE — Assessment & Plan Note (Signed)
Controlled, no change in medication  

## 2016-01-25 NOTE — Assessment & Plan Note (Signed)
Reports formal diagnosis in 2017 through bariatric center

## 2016-01-25 NOTE — Assessment & Plan Note (Signed)
>>  ASSESSMENT AND PLAN FOR DEPRESSION WRITTEN ON 01/25/2016  9:13 AM BY SIMPSON, MARGARET E, MD  Controlled, no change in medication

## 2016-01-25 NOTE — Assessment & Plan Note (Signed)
Increased symptoms with change in season, pt to commit to daily medication

## 2016-01-29 ENCOUNTER — Other Ambulatory Visit: Payer: Self-pay

## 2016-01-29 MED ORDER — MONTELUKAST SODIUM 10 MG PO TABS: 10.0000 mg | ORAL_TABLET | Freq: Every day | ORAL | Status: DC

## 2016-01-29 MED ORDER — AZELASTINE HCL 0.1 % NA SOLN: 2.0000 | Freq: Two times a day (BID) | NASAL | Status: DC

## 2016-01-29 MED ORDER — VITAMIN D (ERGOCALCIFEROL) 1.25 MG (50000 UNIT) PO CAPS: 50000.0000 [IU] | ORAL_CAPSULE | ORAL | Status: DC

## 2016-03-24 ENCOUNTER — Emergency Department (HOSPITAL_COMMUNITY): Payer: Medicaid Other

## 2016-03-24 ENCOUNTER — Emergency Department (HOSPITAL_COMMUNITY)
Admission: EM | Admit: 2016-03-24 | Discharge: 2016-03-24 | Disposition: A | Discharge status: Home / Self Care | Payer: Medicaid Other | Attending: Emergency Medicine | Admitting: Emergency Medicine

## 2016-03-24 ENCOUNTER — Encounter (HOSPITAL_COMMUNITY): Payer: Self-pay | Admitting: *Deleted

## 2016-03-24 DIAGNOSIS — W010XXA Fall on same level from slipping, tripping and stumbling without subsequent striking against object, initial encounter: Secondary | ICD-10-CM | POA: Insufficient documentation

## 2016-03-24 DIAGNOSIS — Y92009 Unspecified place in unspecified non-institutional (private) residence as the place of occurrence of the external cause: Secondary | ICD-10-CM | POA: Insufficient documentation

## 2016-03-24 DIAGNOSIS — M79604 Pain in right leg: Secondary | ICD-10-CM | POA: Diagnosis present

## 2016-03-24 DIAGNOSIS — Y999 Unspecified external cause status: Secondary | ICD-10-CM | POA: Diagnosis not present

## 2016-03-24 DIAGNOSIS — Y939 Activity, unspecified: Secondary | ICD-10-CM | POA: Diagnosis not present

## 2016-03-24 DIAGNOSIS — Z79899 Other long term (current) drug therapy: Secondary | ICD-10-CM | POA: Diagnosis not present

## 2016-03-24 DIAGNOSIS — S76311A Strain of muscle, fascia and tendon of the posterior muscle group at thigh level, right thigh, initial encounter: Secondary | ICD-10-CM | POA: Insufficient documentation

## 2016-03-24 DIAGNOSIS — S76911A Strain of unspecified muscles, fascia and tendons at thigh level, right thigh, initial encounter: Secondary | ICD-10-CM

## 2016-03-24 HISTORY — DX: Anxiety disorder, unspecified: F41.9

## 2016-03-24 HISTORY — DX: Sleep apnea, unspecified: G47.30

## 2016-03-24 LAB — POC URINE PREG, ED: PREG TEST UR: NEGATIVE

## 2016-03-24 MED ORDER — NAPROXEN 500 MG PO TABS: 500.0000 mg | ORAL_TABLET | Freq: Two times a day (BID) | ORAL | Status: DC

## 2016-03-24 MED ORDER — OXYCODONE-ACETAMINOPHEN 5-325 MG PO TABS: 1.0000 | ORAL_TABLET | ORAL | Status: DC | PRN

## 2016-03-24 MED ORDER — OXYCODONE-ACETAMINOPHEN 5-325 MG PO TABS
1.0000 | ORAL_TABLET | Freq: Once | ORAL | Status: AC
  Administered 2016-03-24: 1 via ORAL
  Filled 2016-03-24: qty 1

## 2016-03-24 MED ORDER — CYCLOBENZAPRINE HCL 10 MG PO TABS
10.0000 mg | ORAL_TABLET | Freq: Two times a day (BID) | ORAL | Status: DC | PRN
Start: 2016-03-24 — End: 2017-01-20

## 2016-03-24 NOTE — Discharge Instructions (Signed)
Muscle Strain A muscle strain is an injury that occurs when a muscle is stretched beyond its normal length. Usually a small number of muscle fibers are torn when this happens. Muscle strain is rated in degrees. First-degree strains have the least amount of muscle fiber tearing and pain. Second-degree and third-degree strains have increasingly more tearing and pain.  Usually, recovery from muscle strain takes 1-2 weeks. Complete healing takes 5-6 weeks.  CAUSES  Muscle strain happens when a sudden, violent force placed on a muscle stretches it too far. This may occur with lifting, sports, or a fall.  RISK FACTORS Muscle strain is especially common in athletes.  SIGNS AND SYMPTOMS At the site of the muscle strain, there may be:  Pain.  Bruising.  Swelling.  Difficulty using the muscle due to pain or lack of normal function. DIAGNOSIS  Your health care provider will perform a physical exam and ask about your medical history. TREATMENT  Often, the best treatment for a muscle strain is resting, icing, and applying cold compresses to the injured area.  HOME CARE INSTRUCTIONS  1. Use the PRICE method of treatment to promote muscle healing during the first 2-3 days after your injury. The PRICE method involves: 1. Protecting the muscle from being injured again. 2. Restricting your activity and resting the injured body part. 3. Icing your injury. To do this, put ice in a plastic bag. Place a towel between your skin and the bag. Then, apply the ice and leave it on from 15-20 minutes each hour. After the third day, switch to moist heat packs. 4. Apply compression to the injured area with a splint or elastic bandage. Be careful not to wrap it too tightly. This may interfere with blood circulation or increase swelling. 5. Elevate the injured body part above the level of your heart as often as you can. 2. Only take over-the-counter or prescription medicines for pain, discomfort, or fever as directed by  your health care provider. 3. Warming up prior to exercise helps to prevent future muscle strains. SEEK MEDICAL CARE IF:  1. You have increasing pain or swelling in the injured area. 2. You have numbness, tingling, or a significant loss of strength in the injured area. MAKE SURE YOU:  1. Understand these instructions. 2. Will watch your condition. 3. Will get help right away if you are not doing well or get worse.   This information is not intended to replace advice given to you by your health care provider. Make sure you discuss any questions you have with your health care provider.   Document Released: 10/20/2005 Document Revised: 08/10/2013 Document Reviewed: 05/19/2013 Elsevier Interactive Patient Education 2016 [employer].  Crutch Use Crutches are used to take weight off one of your legs or feet when you stand or walk. It is important to use crutches that fit properly. When fitted properly:  Each crutch should be 2-3 finger widths below the armpit.  Your weight should be supported by your hand, and not by resting the armpit on the crutch. RISKS AND COMPLICATIONS Damage to the nerves that extend from your armpit to your hand and arm. To prevent this from happening, make sure your crutches fit properly and do not put pressure on your armpit when using them. HOW TO USE YOUR CRUTCHES If you have been instructed to use partial weight bearing, apply (bear) the amount of weight as your health care provider suggests. Do not bear weight in an amount that causes pain to the area  of injury. Walking 4. Step with the crutches. 5. Swing the healthy leg slightly ahead of the crutches. Going Up Steps If there is no handrail: 3. Step up with the healthy leg. 4. Step up with the crutches and injured leg. 5. Continue in this way. If there is a handrail: 4. Hold both crutches in one hand. 5. Place your free hand on the handrail. 6. While putting your weight on your arms, lift your healthy leg  to the step. 7. Bring the crutches and the injured leg up to that step. 8. Continue in this way. Going Down Steps Be very careful, as going down stairs with crutches is very challenging. If there is no handrail: 1. Step down with the injured leg and crutches. 2. Step down with the healthy leg. If there is a handrail: 1. Place your hand on the handrail. 2. Hold both crutches with your free hand. 3. Lower your injured leg and crutch to the step below you. Make sure to keep the crutch tips in the center of the step, never on the edge. 4. Lower your healthy leg to that step. 5. Continue in this way. Standing Up 1. Hold the injured leg forward. 2. Grab the armrest with one hand and the top of the crutches with the other hand. 3. Using these supports, pull yourself up to a standing position. Sitting Down 1. Hold the injured leg forward. 2. Grab the armrest with one hand and the top of the crutches with the other hand. 3. Lower yourself to a sitting position. SEEK MEDICAL CARE IF:  You still feel unsteady on your feet.  You develop new pain, for example in your armpits, back, shoulder, wrist, or hip.  You develop any numbness or tingling. SEEK IMMEDIATE MEDICAL CARE IF:  You fall.   This information is not intended to replace advice given to you by your health care provider. Make sure you discuss any questions you have with your health care provider.   Document Released: 10/17/2000 Document Revised: 11/10/2014 Document Reviewed: 06/27/2013 Elsevier Interactive Patient Education 2016 [employer]  Acetaminophen; Oxycodone tablets What is this medicine? ACETAMINOPHEN; OXYCODONE (a set a MEE noe fen; ox i KOE done) is a pain reliever. It is used to treat moderate to severe pain. This medicine may be used for other purposes; ask your health care provider or pharmacist if you have questions. What should I tell my health care provider before I take this medicine? They need to know if  you have any of these conditions: -brain tumor -Crohn's disease, inflammatory bowel disease, or ulcerative colitis -drug abuse or addiction -head injury -heart or circulation problems -if you often drink alcohol -kidney disease or problems going to the bathroom -liver disease -lung disease, asthma, or breathing problems -an unusual or allergic reaction to acetaminophen, oxycodone, other opioid analgesics, other medicines, foods, dyes, or preservatives -pregnant or trying to get pregnant -breast-feeding How should I use this medicine? Take this medicine by mouth with a full glass of water. Follow the directions on the prescription label. You can take it with or without food. If it upsets your stomach, take it with food. Take your medicine at regular intervals. Do not take it more often than directed. Talk to your pediatrician regarding the use of this medicine in children. Special care may be needed. Patients over 39 years old may have a stronger reaction and need a smaller dose. Overdosage: If you think you have taken too much of this medicine contact a  poison control center or emergency room at once. NOTE: This medicine is only for you. Do not share this medicine with others. What if I miss a dose? If you miss a dose, take it as soon as you can. If it is almost time for your next dose, take only that dose. Do not take double or extra doses. What may interact with this medicine? -alcohol -antihistamines -barbiturates like amobarbital, butalbital, butabarbital, methohexital, pentobarbital, phenobarbital, thiopental, and secobarbital -benztropine -drugs for bladder problems like solifenacin, trospium, oxybutynin, tolterodine, hyoscyamine, and methscopolamine -drugs for breathing problems like ipratropium and tiotropium -drugs for certain stomach or intestine problems like propantheline, homatropine methylbromide, glycopyrrolate, atropine, belladonna, and dicyclomine -general anesthetics  like etomidate, ketamine, nitrous oxide, propofol, desflurane, enflurane, halothane, isoflurane, and sevoflurane -medicines for depression, anxiety, or psychotic disturbances -medicines for sleep -muscle relaxants -naltrexone -narcotic medicines (opiates) for pain -phenothiazines like perphenazine, thioridazine, chlorpromazine, mesoridazine, fluphenazine, prochlorperazine, promazine, and trifluoperazine -scopolamine -tramadol -trihexyphenidyl This list may not describe all possible interactions. Give your health care provider a list of all the medicines, herbs, non-prescription drugs, or dietary supplements you use. Also tell them if you smoke, drink alcohol, or use illegal drugs. Some items may interact with your medicine. What should I watch for while using this medicine? Tell your doctor or health care professional if your pain does not go away, if it gets worse, or if you have new or a different type of pain. You may develop tolerance to the medicine. Tolerance means that you will need a higher dose of the medication for pain relief. Tolerance is normal and is expected if you take this medicine for a long time. Do not suddenly stop taking your medicine because you may develop a severe reaction. Your body becomes used to the medicine. This does NOT mean you are addicted. Addiction is a behavior related to getting and using a drug for a non-medical reason. If you have pain, you have a medical reason to take pain medicine. Your doctor will tell you how much medicine to take. If your doctor wants you to stop the medicine, the dose will be slowly lowered over time to avoid any side effects. You may get drowsy or dizzy. Do not drive, use machinery, or do anything that needs mental alertness until you know how this medicine affects you. Do not stand or sit up quickly, especially if you are an older patient. This reduces the risk of dizzy or fainting spells. Alcohol may interfere with the effect of this  medicine. Avoid alcoholic drinks. There are different types of narcotic medicines (opiates) for pain. If you take more than one type at the same time, you may have more side effects. Give your health care provider a list of all medicines you use. Your doctor will tell you how much medicine to take. Do not take more medicine than directed. Call emergency for help if you have problems breathing. The medicine will cause constipation. Try to have a bowel movement at least every 2 to 3 days. If you do not have a bowel movement for 3 days, call your doctor or health care professional. Do not take Tylenol (acetaminophen) or medicines that have acetaminophen with this medicine. Too much acetaminophen can be very dangerous. Many nonprescription medicines contain acetaminophen. Always read the labels carefully to avoid taking more acetaminophen. What side effects may I notice from receiving this medicine? Side effects that you should report to your doctor or health care professional as soon as possible: -allergic reactions like  skin rash, itching or hives, swelling of the face, lips, or tongue -breathing difficulties, wheezing -confusion -light headedness or fainting spells -severe stomach pain -unusually weak or tired -yellowing of the skin or the whites of the eyes Side effects that usually do not require medical attention (report to your doctor or health care professional if they continue or are bothersome): -dizziness -drowsiness -nausea -vomiting This list may not describe all possible side effects. Call your doctor for medical advice about side effects. You may report side effects to FDA at 1-800-FDA-1088. Where should I keep my medicine? Keep out of the reach of children. This medicine can be abused. Keep your medicine in a safe place to protect it from theft. Do not share this medicine with anyone. Selling or giving away this medicine is dangerous and against the law. This medicine may cause  accidental overdose and death if it taken by other adults, children, or pets. Mix any unused medicine with a substance like cat litter or coffee grounds. Then throw the medicine away in a sealed container like a sealed bag or a coffee can with a lid. Do not use the medicine after the expiration date. Store at room temperature between 20 and 25 degrees C (68 and 77 degrees F). NOTE: This sheet is a summary. It may not cover all possible information. If you have questions about this medicine, talk to your doctor, pharmacist, or health care provider.    2016, Elsevier/Gold Standard. (2014-09-20 15:18:46)  Naproxen and naproxen sodium oral immediate-release tablets What is this medicine? NAPROXEN (na PROX en) is a non-steroidal anti-inflammatory drug (NSAID). It is used to reduce swelling and to treat pain. This medicine may be used for dental pain, headache, or painful monthly periods. It is also used for painful joint and muscular problems such as arthritis, tendinitis, bursitis, and gout. This medicine may be used for other purposes; ask your health care provider or pharmacist if you have questions. What should I tell my health care provider before I take this medicine? They need to know if you have any of these conditions: -asthma -cigarette smoker -drink more than 3 alcohol containing drinks a day -heart disease or circulation problems such as heart failure or leg edema (fluid retention) -high blood pressure -kidney disease -liver disease -stomach bleeding or ulcers -an unusual or allergic reaction to naproxen, aspirin, other NSAIDs, other medicines, foods, dyes, or preservatives -pregnant or trying to get pregnant -breast-feeding How should I use this medicine? Take this medicine by mouth with a glass of water. Follow the directions on the prescription label. Take it with food if your stomach gets upset. Try to not lie down for at least 10 minutes after you take it. Take your medicine at  regular intervals. Do not take your medicine more often than directed. Long-term, continuous use may increase the risk of heart attack or stroke. A special MedGuide will be given to you by the pharmacist with each prescription and refill. Be sure to read this information carefully each time. Talk to your pediatrician regarding the use of this medicine in children. Special care may be needed. Overdosage: If you think you have taken too much of this medicine contact a poison control center or emergency room at once. NOTE: This medicine is only for you. Do not share this medicine with others. What if I miss a dose? If you miss a dose, take it as soon as you can. If it is almost time for your next dose, take only that  dose. Do not take double or extra doses. What may interact with this medicine? -alcohol -aspirin -cidofovir -diuretics -lithium -methotrexate -other drugs for inflammation like ketorolac or prednisone -pemetrexed -probenecid -warfarin This list may not describe all possible interactions. Give your health care provider a list of all the medicines, herbs, non-prescription drugs, or dietary supplements you use. Also tell them if you smoke, drink alcohol, or use illegal drugs. Some items may interact with your medicine. What should I watch for while using this medicine? Tell your doctor or health care professional if your pain does not get better. Talk to your doctor before taking another medicine for pain. Do not treat yourself. This medicine does not prevent heart attack or stroke. In fact, this medicine may increase the chance of a heart attack or stroke. The chance may increase with longer use of this medicine and in people who have heart disease. If you take aspirin to prevent heart attack or stroke, talk with your doctor or health care professional. Do not take other medicines that contain aspirin, ibuprofen, or naproxen with this medicine. Side effects such as stomach upset,  nausea, or ulcers may be more likely to occur. Many medicines available without a prescription should not be taken with this medicine. This medicine can cause ulcers and bleeding in the stomach and intestines at any time during treatment. Do not smoke cigarettes or drink alcohol. These increase irritation to your stomach and can make it more susceptible to damage from this medicine. Ulcers and bleeding can happen without warning symptoms and can cause death. You may get drowsy or dizzy. Do not drive, use machinery, or do anything that needs mental alertness until you know how this medicine affects you. Do not stand or sit up quickly, especially if you are an older patient. This reduces the risk of dizzy or fainting spells. This medicine can cause you to bleed more easily. Try to avoid damage to your teeth and gums when you brush or floss your teeth. What side effects may I notice from receiving this medicine? Side effects that you should report to your doctor or health care professional as soon as possible: -black or bloody stools, blood in the urine or vomit -blurred vision -chest pain -difficulty breathing or wheezing -nausea or vomiting -severe stomach pain -skin rash, skin redness, blistering or peeling skin, hives, or itching -slurred speech or weakness on one side of the body -swelling of eyelids, throat, lips -unexplained weight gain or swelling -unusually weak or tired -yellowing of eyes or skin Side effects that usually do not require medical attention (report to your doctor or health care professional if they continue or are bothersome): -constipation -headache -heartburn This list may not describe all possible side effects. Call your doctor for medical advice about side effects. You may report side effects to FDA at 1-800-FDA-1088. Where should I keep my medicine? Keep out of the reach of children. Store at room temperature between 15 and 30 degrees C (59 and 86 degrees F). Keep  container tightly closed. Throw away any unused medicine after the expiration date. NOTE: This sheet is a summary. It may not cover all possible information. If you have questions about this medicine, talk to your doctor, pharmacist, or health care provider.    2016, Elsevier/Gold Standard. (2009-10-22 20:10:16)  Cyclobenzaprine tablets What is this medicine? CYCLOBENZAPRINE (sye kloe BEN za preen) is a muscle relaxer. It is used to treat muscle pain, spasms, and stiffness. This medicine may be used for other purposes;  ask your health care provider or pharmacist if you have questions. What should I tell my health care provider before I take this medicine? They need to know if you have any of these conditions: -heart disease, irregular heartbeat, or previous heart attack -liver disease -thyroid problem -an unusual or allergic reaction to cyclobenzaprine, tricyclic antidepressants, lactose, other medicines, foods, dyes, or preservatives -pregnant or trying to get pregnant -breast-feeding How should I use this medicine? Take this medicine by mouth with a glass of water. Follow the directions on the prescription label. If this medicine upsets your stomach, take it with food or milk. Take your medicine at regular intervals. Do not take it more often than directed. Talk to your pediatrician regarding the use of this medicine in children. Special care may be needed. Overdosage: If you think you have taken too much of this medicine contact a poison control center or emergency room at once. NOTE: This medicine is only for you. Do not share this medicine with others. What if I miss a dose? If you miss a dose, take it as soon as you can. If it is almost time for your next dose, take only that dose. Do not take double or extra doses. What may interact with this medicine? Do not take this medicine with any of the following medications: -certain medicines for fungal infections like fluconazole,  itraconazole, ketoconazole, posaconazole, voriconazole -cisapride -dofetilide -dronedarone -droperidol -flecainide -grepafloxacin -halofantrine -levomethadyl -MAOIs like Carbex, Eldepryl, Marplan, Nardil, and Parnate -nilotinib -pimozide -probucol -sertindole -thioridazine -ziprasidone This medicine may also interact with the following medications: -abarelix -alcohol -certain medicines for cancer -certain medicines for depression, anxiety, or psychotic disturbances -certain medicines for infection like alfuzosin, chloroquine, clarithromycin, levofloxacin, mefloquine, pentamidine, troleandomycin -certain medicines for an irregular heart beat -certain medicines used for sleep or numbness during surgery or procedure -contrast dyes -dolasetron -guanethidine -methadone -octreotide -ondansetron -other medicines that prolong the QT interval (cause an abnormal heart rhythm) -palonosetron -phenothiazines like chlorpromazine, mesoridazine, prochlorperazine, thioridazine -tramadol -vardenafil This list may not describe all possible interactions. Give your health care provider a list of all the medicines, herbs, non-prescription drugs, or dietary supplements you use. Also tell them if you smoke, drink alcohol, or use illegal drugs. Some items may interact with your medicine. What should I watch for while using this medicine? Check with your doctor or health care professional if your condition does not improve within 1 to 3 weeks. You may get drowsy or dizzy when you first start taking the medicine or change doses. Do not drive, use machinery, or do anything that may be dangerous until you know how the medicine affects you. Stand or sit up slowly. Your mouth may get dry. Drinking water, chewing sugarless gum, or sucking on hard candy may help. What side effects may I notice from receiving this medicine? Side effects that you should report to your doctor or health care professional as soon  as possible: -allergic reactions like skin rash, itching or hives, swelling of the face, lips, or tongue -chest pain -fast heartbeat -hallucinations -seizures -vomiting Side effects that usually do not require medical attention (report to your doctor or health care professional if they continue or are bothersome): -headache This list may not describe all possible side effects. Call your doctor for medical advice about side effects. You may report side effects to FDA at 1-800-FDA-1088. Where should I keep my medicine? Keep out of the reach of children. Store at room temperature between 15 and 30 degrees C (59 and  86 degrees F). Keep container tightly closed. Throw away any unused medicine after the expiration date. NOTE: This sheet is a summary. It may not cover all possible information. If you have questions about this medicine, talk to your doctor, pharmacist, or health care provider.    2016, Elsevier/[employer] Standard. (2013-05-17 12:48:19)

## 2016-03-24 NOTE — ED Notes (Signed)
Patient was given a prepackage of Percocet quantity six and instructions on use.  Patient verbally understands.

## 2016-03-24 NOTE — ED Notes (Addendum)
[facility] experiencing a fall at home, pt states that she was getting up off the couch, slipped on the floor, causing her legs to "do a split" pt c/o pain to right inner thigh area, pt was given two ibuprofen prior to arrival in er by ems,

## 2016-03-24 NOTE — ED Provider Notes (Signed)
CSN: [id number]     Arrival date & time 03/24/16  0007 History  By signing my name below, I, Stacey Palmer, attest that this documentation has been prepared under the direction and in the presence of Dione Boozeavid , MD.   Electronically Signed: [name], ED Scribe. 03/24/2016. 12:21 AM   Chief Complaint  Patient presents with  . Fall   The history is provided by the patient. No language interpreter was used.   HPI Comments: Stacey Palmer is a 35 y.o. female with PMHx of obesity, sleep apnea, and anxiety who presents to the Emergency Department via EMS complaining of sudden onset, 8/10, right leg pain s/p fall earlier tonight in which she slipped on the floor and "did a split". Pt states she "heard a pop". She notes associated pain to the right inner thigh. Pt is ambulatory with pain. No other associated symptoms noted. No other worsening or alleviating factors noted. Pt denies numbness, tingling, weakness, injury to other areas, or any other pertinent symptoms.   Past Medical History  Diagnosis Date  . Allergy   . Obesity   . Sleep apnea   . Anxiety    Past Surgical History  Procedure Laterality Date  . Cesarean section    . Tubal ligation     Family History  Problem Relation Age of Onset  . Hypertension Mother   . Kidney disease Father   . Diabetes Father    Social History  Substance Use Topics  . Smoking status: Never Smoker   . Smokeless tobacco: None  . Alcohol Use: No   OB History    No data available     Review of Systems  Musculoskeletal: Positive for myalgias.  Neurological: Negative for weakness and numbness.  All other systems reviewed and are negative.   Allergies  Review of patient's allergies indicates no known allergies.  Home Medications   Prior to Admission medications   Medication Sig Start Date End Date Taking? Authorizing Provider  azelastine (ASTELIN) 0.1 % nasal spray Place 2 sprays into both nostrils 2 (two) times daily. Use in each  nostril as directed 01/29/16  Yes Kerri PerchesMargaret E Simpson, MD  montelukast (SINGULAIR) 10 MG tablet Take 1 tablet (10 mg total) by mouth at bedtime. 01/29/16  Yes Kerri PerchesMargaret E Simpson, MD  venlafaxine (EFFEXOR) 37.5 MG tablet TAKE ONE TABLET BY MOUTH TWICE DAILY ---- 11/08/15  Yes Kerri PerchesMargaret E Simpson, MD  Vitamin D, Ergocalciferol, (DRISDOL) 50000 units CAPS capsule Take 1 capsule (50,000 Units total) by mouth once a week. 01/29/16  Yes Kerri PerchesMargaret E Simpson, MD  fluconazole (DIFLUCAN) 150 MG tablet One tablet once , as needed, for vaginal itch following antibiotic use 01/24/16   Kerri PerchesMargaret E Simpson, MD  penicillin v potassium (VEETID) 500 MG tablet Take 1 tablet (500 mg total) by mouth 3 (three) times daily. 01/24/16   Kerri PerchesMargaret E Simpson, MD   BP 130/91 mmHg  Pulse 60  Temp(Src) 98 F (36.7 C) (Oral)  Resp 18  Ht 5\' 7"  (1.702 m)  Wt 240 lb (108.863 kg)  BMI 37.58 kg/m2  SpO2 100%  LMP 03/03/2016 Physical Exam  Constitutional: She is oriented to person, place, and time. She appears well-developed and well-nourished. No distress.  HENT:  Head: Normocephalic and atraumatic.  Eyes: EOM are normal. Pupils are equal, round, and reactive to light.  Neck: Normal range of motion. Neck supple. No JVD present.  Cardiovascular: Normal rate, regular rhythm and normal heart sounds.   No murmur heard. Pulmonary/Chest:  Effort normal and breath sounds normal. She has no wheezes. She has no rales. She exhibits no tenderness.  Abdominal: Soft. Bowel sounds are normal. She exhibits no distension and no mass. There is no tenderness.  Musculoskeletal: Normal range of motion. She exhibits edema.  Moderate TTP to medial aspect of right thigh. No pelvis TTP.   Lymphadenopathy:    She has no cervical adenopathy.  Neurological: She is alert and oriented to person, place, and time. No cranial nerve deficit. She exhibits normal muscle tone. Coordination normal.  Skin: Skin is warm and dry. No rash noted.  Psychiatric: She has a  normal mood and affect. Her behavior is normal. Judgment and thought content normal.  Nursing note and vitals reviewed.   ED Course  Procedures (including critical care time) DIAGNOSTIC STUDIES: Oxygen Saturation is 100% on RA, normal by my interpretation.    COORDINATION OF CARE: 12:21 AM Discussed treatment plan with pt at bedside and pt agreed to plan.  Labs Review Results for orders placed or performed during the hospital encounter of 03/24/16  POC urine preg, ED (not at [facility])  Result Value Ref Range   Preg Test, Ur NEGATIVE NEGATIVE   Imaging Review Dg Femur, Min 2 Views Right  03/24/2016  CLINICAL DATA:  Fall getting up off the couch, right femur/ thigh pain, heard a pop. EXAM: RIGHT FEMUR 2 VIEWS COMPARISON:  None. FINDINGS: There is no evidence of fracture or other focal bone lesions. Hip and knee alignment is maintained. No focal soft tissue abnormality. IMPRESSION: Negative radiographs of the right femur. Electronically Signed   By: Rubye Oaks M.D.   On: 03/24/2016 03:01   I have personally reviewed and evaluated these images as part of my medical decision-making.   MDM   Final diagnoses:  Muscle strain of right thigh, initial encounter   Injury to the right leg which seems clearly muscular. She is sent for x-ray which confirms no fracture. She is given crutches to use as needed and is discharged with prescriptions for naproxen, cyclobenzaprine, and oxycodone-acetaminophen. Referred back to primary care provider. Advised that she may benefit from physical therapy during her recovery.   I personally performed the services described in this documentation, which was scribed in my presence. The recorded information has been reviewed and is accurate.       Dione Booze, MD 03/24/16 [phone]

## 2016-03-25 ENCOUNTER — Encounter: Payer: Self-pay | Admitting: Family Medicine

## 2016-03-25 ENCOUNTER — Ambulatory Visit (INDEPENDENT_AMBULATORY_CARE_PROVIDER_SITE_OTHER): Payer: Medicaid Other | Admitting: Family Medicine

## 2016-03-25 VITALS — BP 138/82 | HR 99 | Resp 16 | Ht 66.0 in

## 2016-03-25 DIAGNOSIS — F32A Depression, unspecified: Secondary | ICD-10-CM

## 2016-03-25 DIAGNOSIS — M5442 Lumbago with sciatica, left side: Secondary | ICD-10-CM

## 2016-03-25 DIAGNOSIS — M549 Dorsalgia, unspecified: Secondary | ICD-10-CM | POA: Insufficient documentation

## 2016-03-25 DIAGNOSIS — B353 Tinea pedis: Secondary | ICD-10-CM

## 2016-03-25 DIAGNOSIS — F329 Major depressive disorder, single episode, unspecified: Secondary | ICD-10-CM

## 2016-03-25 DIAGNOSIS — R296 Repeated falls: Secondary | ICD-10-CM | POA: Diagnosis not present

## 2016-03-25 DIAGNOSIS — M5441 Lumbago with sciatica, right side: Secondary | ICD-10-CM

## 2016-03-25 DIAGNOSIS — M25561 Pain in right knee: Secondary | ICD-10-CM

## 2016-03-25 DIAGNOSIS — Z09 Encounter for follow-up examination after completed treatment for conditions other than malignant neoplasm: Secondary | ICD-10-CM | POA: Diagnosis not present

## 2016-03-25 DIAGNOSIS — M25569 Pain in unspecified knee: Secondary | ICD-10-CM | POA: Insufficient documentation

## 2016-03-25 MED ORDER — KETOROLAC TROMETHAMINE 60 MG/2ML IM SOLN
60.0000 mg | Freq: Once | INTRAMUSCULAR | Status: AC
  Administered 2016-03-25: 60 mg via INTRAMUSCULAR

## 2016-03-25 MED ORDER — CLOTRIMAZOLE-BETAMETHASONE 1-0.05 % EX CREA: TOPICAL_CREAM | CUTANEOUS | Status: DC

## 2016-03-25 MED FILL — Oxycodone w/ Acetaminophen Tab 5-325 MG: ORAL | Qty: 6 | Status: AC

## 2016-03-25 NOTE — Patient Instructions (Signed)
F/u as before, call if you need me sooner  Toradol in office today, you need to also take the naproxen and muscle relaxanrt prescribed in the ED  You do not need script for oxycodone as you are onlly takingf 2 daily and should need less and less.  TIME and the anti inflammatories are the better meds to heal sore and inflamed muscles   You are referred to Dr Gerilyn Pilgrimoonquah due to repeated falls  X ray of low back today  Work excuse from 5/22 until the day of your appt with orthopedic  Doc  Hope you feel better soon!  Thank you  for choosing [facility]. We consider it a privelige to serve you.  Delivering excellent health care in a caring and  compassionate way is our goal.  Partnering with you,  so that together we can achieve this goal is our strategy.

## 2016-03-25 NOTE — Assessment & Plan Note (Signed)
repoprts over 4 falls in the past 5 months with right leg weakness and reduced sensation in leg, needs neurology eval

## 2016-03-25 NOTE — Assessment & Plan Note (Signed)
toradol in office, pt to take anti inflammatory meds and muscle relaxant as prescribed, no additional narcotic med.indicated atr this time, actually taking 2 daily and has not filled the oother 2 scriops for naproxen and muscle relaxant from ED as yet Refer to ortho soonest availaable  Work excuse till sees ortho, unable to weight bear currently

## 2016-03-25 NOTE — Assessment & Plan Note (Signed)
Disabling pain in rLE and inability to weight bear following fall, has muscle strain, needs to be out of work and follow up with ortho

## 2016-03-25 NOTE — Progress Notes (Signed)
Subjective:    Patient ID: Stacey Palmer, female    DOB: [birth year], 35 y.o.   MRN: [id number]  HPI Pt in for ED follow up from fall 2 days ago, states her right leg just gave out. No on crutches and unable to weight bear. ED eval diagnosis of muscle strain, anti inflammatoris and muscle relaxant prescribed, she has filled none, only the narcoptic med which she takes twice daily and not 4 times daily. Re educated her, she will commit to the 2 unfilled scripts, and due to extent of injury is referred to ortho Reports at least 3 falls in the past 4 months, leg tends to "give out" No known f/h of neurologic disease, but history is concerning [place]  headaches, memory loss, vision impairment or sensory changes C/o increased anxiety and stress and asked to be referred to therapist. She is not suicidal or homicidal C/o fungal rash and requests medication  Review of Systems See HPI Denies recent fever or chills. Denies sinus pressure, nasal congestion, ear pain or sore throat. Denies chest congestion, productive cough or wheezing. Denies chest pains, palpitations and leg swelling Denies abdominal pain, nausea, vomiting,diarrhea or constipation.   Denies dysuria, frequency, hesitancy or incontinence.  Denies headaches, seizures, numbness, or tingling.        Objective:   Physical Exam  BP 138/82 mmHg  Pulse 99  Resp 16  Ht  (1.676 m)  SpO2 99%  LMP 03/03/2016 Patient alert and oriented and in no cardiopulmonary distress.Anxious and in pain, weight bearing with a crutch  HEENT: No facial asymmetry, EOMI,   oropharynx pink and moist.  Neck supple no JVD, no mass.  Chest: Clear to auscultation bilaterally.  CVS: S1, S2 no murmurs, no S3.Regular rate.  ABD: Soft non tender.   Ext: No edema  MS: decreased ROM spine, and right lower extremity  Skin: Intact, fungal rash noted.  Psych: Good eye contact, normal affect. Memory intact  anxious not  depressed appearing.  CNS: CN  2-12 intact, power,  normal throughout.no focal deficits noted.      Assessment & Plan:  Recurrent falls repoprts over 4 falls in the past 5 months with right leg weakness and reduced sensation in leg, needs neurology eval   Back pain toradol in office, pt to take anti inflammatory meds and muscle relaxant as prescribed, no additional narcotic med.indicated atr this time, actually taking 2 daily and has not filled the oother 2 scriops for naproxen and muscle relaxant from ED as yet Refer to ortho soonest availaable  Work excuse till sees ortho, unable to weight bear currently  Pain in joint, lower leg Disabling pain in rLE and inability to weight bear following fall, has muscle strain, needs to be out of work and follow up with ortho  Depression Overwhelmed and anxious, states needs "someone to talk to" referred to therapist. Not suicidal or homicidal, no change in medication at this time  Morbid obesity Deteriorated. Patient re-educated about  the importance of commitment to a  minimum of 150 minutes of exercise per week.  The importance of healthy food choices with portion control discussed. Encouraged to start a food diary, count calories and to consider  joining a support group. Sample diet sheets offered. Goals set by the patient for the next several months.   Weight /BMI 03/26/2016 03/25/2016 03/24/2016  WEIGHT 280 lb - 240 lb  HEIGHT     BMI 45.21 kg/m2 - 37.58 kg/m2  Currently in the process of arranging bariatric surgery.   Tinea pedis Topical med prescribed  Encounter for examination following treatment at hospital In Ed one day before following a fall which has resulted in right thigh strain and inability to weight bear. Still in significant pain and using crutch, nmeeds ortho input

## 2016-03-26 ENCOUNTER — Encounter: Payer: Self-pay | Admitting: Orthopaedic Surgery

## 2016-03-26 ENCOUNTER — Ambulatory Visit (INDEPENDENT_AMBULATORY_CARE_PROVIDER_SITE_OTHER): Payer: Medicaid Other | Admitting: Orthopaedic Surgery

## 2016-03-26 ENCOUNTER — Ambulatory Visit (HOSPITAL_COMMUNITY)
Admission: RE | Admit: 2016-03-26 | Discharge: 2016-03-26 | Disposition: A | Discharge status: Home / Self Care | Payer: Medicaid Other | Source: Ambulatory Visit | Attending: Family Medicine | Admitting: Family Medicine

## 2016-03-26 ENCOUNTER — Ambulatory Visit: Payer: Medicaid Other | Admitting: Family Medicine

## 2016-03-26 VITALS — BP 131/89 | HR 75 | Temp 97.7°F | Ht 66.0 in | Wt 280.0 lb

## 2016-03-26 DIAGNOSIS — G4730 Sleep apnea, unspecified: Secondary | ICD-10-CM | POA: Diagnosis not present

## 2016-03-26 DIAGNOSIS — S76911A Strain of unspecified muscles, fascia and tendons at thigh level, right thigh, initial encounter: Secondary | ICD-10-CM | POA: Diagnosis not present

## 2016-03-26 DIAGNOSIS — R296 Repeated falls: Secondary | ICD-10-CM | POA: Diagnosis present

## 2016-03-26 MED ORDER — HYDROCODONE-ACETAMINOPHEN 5-325 MG PO TABS: 1.0000 | ORAL_TABLET | ORAL | Status: DC | PRN

## 2016-03-26 NOTE — Patient Instructions (Signed)
Out of work 

## 2016-03-26 NOTE — Progress Notes (Signed)
Subjective: I fell at home and did the "split" and hurt my right thigh    Patient ID: Stacey MuskratSonja L Kucinski, female    DOB: 07/20/1981, 35 y.o.   MRN: [id number]  Leg Pain  The incident occurred 3 to 5 days ago. The injury mechanism was a fall. The pain is present in the right thigh. The quality of the pain is described as aching. The pain is at a severity of 5/10. The pain is moderate. The pain has been improving since onset. Associated symptoms include an inability to bear weight and a loss of motion. Pertinent negatives include no loss of sensation, muscle weakness, numbness or tingling. The symptoms are aggravated by weight bearing. She has tried non-weight bearing, rest and ice for the symptoms. The treatment provided moderate relief.   She fell at home three days ago and hurt her right thigh.  She tripped and did the "split" and had a lot of weight go on the right inner thigh.  She was seen in the ER.  X-rays were done and are negative.  She was given pain medicine and also crutches.  She is a little better now.  She uses ice.  She cannot stand without the crutches.  I have reviewed the x-rays, the x-ray report and the ER records.  She had no other injury.   Review of Systems  HENT: Negative for congestion.   Respiratory: Negative for cough and shortness of breath.   Cardiovascular: Negative for chest pain and leg swelling.  Endocrine: Positive for cold intolerance.  Musculoskeletal: Positive for myalgias, arthralgias and gait problem.  Allergic/Immunologic: Positive for environmental allergies.  Neurological: Negative for tingling and numbness.  Psychiatric/Behavioral: Positive for sleep disturbance. The patient is nervous/anxious.    Past Medical History  Diagnosis Date  . Allergy   . Obesity   . Sleep apnea   . Anxiety     Past Surgical History  Procedure Laterality Date  . Cesarean section    . Tubal ligation      Current Outpatient Prescriptions on File Prior to Visit   Medication Sig Dispense Refill  . azelastine (ASTELIN) 0.1 % nasal spray Place 2 sprays into both nostrils 2 (two) times daily. Use in each nostril as directed 30 mL 4  . clotrimazole-betamethasone (LOTRISONE) cream Apply twice daily to rash for 10 days, then , as needed 45 g 0  . cyclobenzaprine (FLEXERIL) 10 MG tablet Take 1 tablet (10 mg total) by mouth 2 (two) times daily as needed for muscle spasms. 20 tablet 0  . montelukast (SINGULAIR) 10 MG tablet Take 1 tablet (10 mg total) by mouth at bedtime. 30 tablet 4  . naproxen (NAPROSYN) 500 MG tablet Take 1 tablet (500 mg total) by mouth 2 (two) times daily. 30 tablet 0  . venlafaxine (EFFEXOR) 37.5 MG tablet TAKE ONE TABLET BY MOUTH TWICE DAILY ---- 60 tablet 2  . Vitamin D, Ergocalciferol, (DRISDOL) 50000 units CAPS capsule Take 1 capsule (50,000 Units total) by mouth once a week. 4 capsule 4   No current facility-administered medications on file prior to visit.    Social History   Social History  . Marital Status: Single    Spouse Name: N/A  . Number of Children: N/A  . Years of Education: N/A   Occupational History  . Not on file.   Social History Main Topics  . Smoking status: Never Smoker   . Smokeless tobacco: Not on file  . Alcohol Use: No  .  Drug Use: No  . Sexual Activity: Not on file   Other Topics Concern  . Not on file   Social History Narrative    BP 131/89 mmHg  Pulse 75  Temp(Src) 97.7 F (36.5 C)  Ht  (1.676 m)  Wt 280 lb (127.007 kg)  BMI 45.21 kg/m2  LMP 03/03/2016     Objective:   Physical Exam  Constitutional: She is oriented to person, place, and time. She appears well-developed and well-nourished.  HENT:  Head: Normocephalic and atraumatic.  Eyes: Conjunctivae and EOM are normal. Pupils are equal, round, and reactive to light.  Neck: Normal range of motion. Neck supple.  Cardiovascular: Normal rate, regular rhythm and intact distal pulses.   Pulmonary/Chest: Effort normal.   Abdominal: Soft.  Musculoskeletal: She exhibits tenderness (She is very tender in the inner thigh and adductor muscles.  She has no swelling, no numbness.  NV intact.  She has pain in trying to stand.).  Neurological: She is alert and oriented to person, place, and time. She displays normal reflexes. No cranial nerve deficit. She exhibits normal muscle tone. Coordination normal.  Skin: Skin is warm and dry.  Psychiatric: She has a normal mood and affect. Her behavior is normal. Judgment and thought content normal.       Assessment & Plan:   Encounter Diagnoses  Name Primary?  . Muscle strain of thigh, right, initial encounter Yes  . Sleep apnea   . Morbid obesity due to excess calories (HCC)     I have told her to use the crutches.  They were adjusted while she was here.  She is to use ice, take warm showers at home.  She is to be out of work.  Return in one week.  Call if any problem.  Pain medicine given.  Precautions discussed.

## 2016-03-30 DIAGNOSIS — Z09 Encounter for follow-up examination after completed treatment for conditions other than malignant neoplasm: Secondary | ICD-10-CM | POA: Insufficient documentation

## 2016-03-30 NOTE — Assessment & Plan Note (Signed)
Overwhelmed and anxious, states needs "someone to talk to" referred to therapist. Not suicidal or homicidal, no change in medication at this time

## 2016-03-30 NOTE — Assessment & Plan Note (Signed)
Topical med prescribed 

## 2016-03-30 NOTE — Assessment & Plan Note (Signed)
>>  ASSESSMENT AND PLAN FOR DEPRESSION WRITTEN ON 03/30/2016  4:31 PM BY SIMPSON, MARGARET E, MD  Overwhelmed and anxious, states needs "someone to talk to" referred to therapist. Not suicidal or homicidal, no change in medication at this time

## 2016-03-30 NOTE — Assessment & Plan Note (Signed)
In Ed one day before following a fall which has resulted in right thigh strain and inability to weight bear. Still in significant pain and using crutch, nmeeds ortho input

## 2016-03-30 NOTE — Assessment & Plan Note (Signed)
Deteriorated. Patient re-educated about  the importance of commitment to a  minimum of 150 minutes of exercise per week.  The importance of healthy food choices with portion control discussed. Encouraged to start a food diary, count calories and to consider  joining a support group. Sample diet sheets offered. Goals set by the patient for the next several months.   Weight /BMI 03/26/2016 03/25/2016 03/24/2016  WEIGHT 280 lb - 240 lb  HEIGHT 5\' 6"  5\' 6"  5\' 7"   BMI 45.21 kg/m2 - 37.58 kg/m2    Currently in the process of arranging bariatric surgery.

## 2016-04-02 ENCOUNTER — Telehealth: Payer: Self-pay | Admitting: Family Medicine

## 2016-04-02 ENCOUNTER — Ambulatory Visit (INDEPENDENT_AMBULATORY_CARE_PROVIDER_SITE_OTHER): Payer: Medicaid Other | Admitting: Orthopaedic Surgery

## 2016-04-02 ENCOUNTER — Encounter: Payer: Self-pay | Admitting: Orthopedic Surgery

## 2016-04-02 ENCOUNTER — Encounter: Payer: Self-pay | Admitting: Orthopaedic Surgery

## 2016-04-02 VITALS — BP 131/79 | HR 75 | Temp 97.3°F | Ht 66.0 in | Wt 280.0 lb

## 2016-04-02 DIAGNOSIS — G4730 Sleep apnea, unspecified: Secondary | ICD-10-CM | POA: Diagnosis not present

## 2016-04-02 DIAGNOSIS — S76911A Strain of unspecified muscles, fascia and tendons at thigh level, right thigh, initial encounter: Secondary | ICD-10-CM

## 2016-04-02 NOTE — Patient Instructions (Addendum)
Use heat and ice. Start walking some. Stay out of work. Recommended exercises to be done in a pool if possible.

## 2016-04-02 NOTE — Progress Notes (Signed)
Stacey Palmer, female DOB:06/13/1981, 35 y.o. NWG:[id number]  Chief Complaint  Stacey presents with  . Follow-up    Right thigh    HPI  Stacey Palmer is a 35 y.o. female who has pain in both thighs more on the right after doing the "split" in a fall about a week ago.  She is doing better.  She is using her crutches.  She has less pain but is still tender in the hamstring area on the right.  She has less swelling.  She has no redness, no numbness.  She is doing the exercises and using ice.  HPI  Body mass index is 45.21 kg/(m^2).  ROS  Review of Systems  HENT: Negative for congestion.   Respiratory: Negative for cough and shortness of breath.   Cardiovascular: Negative for chest pain and leg swelling.  Endocrine: Positive for cold intolerance.  Musculoskeletal: Positive for myalgias, arthralgias and gait problem.  Allergic/Immunologic: Positive for environmental allergies.  Neurological: Negative for numbness.  Psychiatric/Behavioral: Positive for sleep disturbance. The Stacey is nervous/anxious.     Past Medical History  Diagnosis Date  . Allergy   . Obesity   . Sleep apnea   . Anxiety     Past Surgical History  Procedure Laterality Date  . Cesarean section    . Tubal ligation      Family History  Problem Relation Age of Onset  . Hypertension Mother   . Kidney disease Father   . Diabetes Father     Social History Social History  Substance Use Topics  . Smoking status: Never Smoker   . Smokeless tobacco: None  . Alcohol Use: No    No Known Allergies  Current Outpatient Prescriptions  Medication Sig Dispense Refill  . azelastine (ASTELIN) 0.1 % nasal spray Place 2 sprays into both nostrils 2 (two) times daily. Use in each nostril as directed 30 mL 4  . clotrimazole-betamethasone (LOTRISONE) cream Apply twice daily to rash for 10 days, then , as needed 45 g 0  . cyclobenzaprine (FLEXERIL) 10 MG tablet Take 1 tablet (10 mg total) by mouth 2 (two)  times daily as needed for muscle spasms. 20 tablet 0  . HYDROcodone-acetaminophen (NORCO/VICODIN) 5-325 MG tablet Take 1 tablet by mouth every 4 (four) hours as needed for moderate pain (Must last 15 days.Do not take and drive a car or use machinery.). 60 tablet 0  . montelukast (SINGULAIR) 10 MG tablet Take 1 tablet (10 mg total) by mouth at bedtime. 30 tablet 4  . naproxen (NAPROSYN) 500 MG tablet Take 1 tablet (500 mg total) by mouth 2 (two) times daily. 30 tablet 0  . venlafaxine (EFFEXOR) 37.5 MG tablet TAKE ONE TABLET BY MOUTH TWICE DAILY ---- 60 tablet 2  . Vitamin D, Ergocalciferol, (DRISDOL) 50000 units CAPS capsule Take 1 capsule (50,000 Units total) by mouth once a week. 4 capsule 4   No current facility-administered medications for this visit.     Physical Exam  Blood pressure 131/79, pulse 75, temperature 97.3 F (36.3 C), height  (1.676 m), weight 280 lb (127.007 kg), last menstrual period 03/03/2016.  Constitutional: overall normal hygiene, normal nutrition, well developed, normal grooming, normal body habitus. Assistive device:crutches  Musculoskeletal: gait and station Limp right, muscle tone and strength are normal, no tremors or atrophy is present.  .  Neurological: coordination overall normal.  Deep tendon reflex/nerve stretch intact.  Sensation normal.  Cranial nerves II-XII intact.   Skin:   normal overall  no scars, lesions, ulcers or rashes. No psoriasis.  Psychiatric: Alert and oriented x 3.  Recent memory intact, remote memory unclear.  Normal mood and affect. Well groomed.  Good eye contact.  Cardiovascular: overall no swelling, no varicosities, no edema bilaterally, normal temperatures of the legs and arms, no clubbing, cyanosis and good capillary refill.  Lymphatic: palpation is normal.   She has limp to the right and hamstring distally more laterally on the right are tender.  She has less swelling and pain.  She has full motion of the right knee  today and no effusion.  NV is intact.  Left side hamstrings tender but full motion. The Stacey has been educated about the nature of the problem(s) and counseled on treatment options.  The Stacey appeared to understand what I have discussed and is in agreement with it.  Encounter Diagnoses  Name Primary?  . Muscle strain of thigh, right, initial encounter Yes  . Sleep apnea   . Morbid obesity due to excess calories [facility](HCC)     PLAN Call if any problems.  Precautions discussed.  Continue current medications.   Return to clinic 2 weeks   Stay out of work.  Consider swimming exercises

## 2016-04-02 NOTE — Telephone Encounter (Signed)
Called and left message for patient notifying that she is due for a pap.

## 2016-04-02 NOTE — Telephone Encounter (Signed)
Stacey Palmer is scheduled on Friday June 16 at 1:45

## 2016-04-02 NOTE — Telephone Encounter (Signed)
Stacey Palmer is calling stating that she needs a note stating last physical exam, if she needs to schedule one please call and advise

## 2016-04-03 NOTE — Telephone Encounter (Signed)
Noted  

## 2016-04-15 ENCOUNTER — Encounter: Payer: Self-pay | Admitting: Orthopedic Surgery

## 2016-04-15 ENCOUNTER — Ambulatory Visit (INDEPENDENT_AMBULATORY_CARE_PROVIDER_SITE_OTHER): Payer: Medicaid Other | Admitting: Orthopaedic Surgery

## 2016-04-15 ENCOUNTER — Encounter: Payer: Self-pay | Admitting: Orthopaedic Surgery

## 2016-04-15 VITALS — BP 128/81 | HR 73 | Temp 97.9°F | Ht 66.0 in | Wt 273.0 lb

## 2016-04-15 DIAGNOSIS — S76911D Strain of unspecified muscles, fascia and tendons at thigh level, right thigh, subsequent encounter: Secondary | ICD-10-CM

## 2016-04-15 DIAGNOSIS — G4730 Sleep apnea, unspecified: Secondary | ICD-10-CM

## 2016-04-15 NOTE — Patient Instructions (Signed)
Use ice and Ace bandages

## 2016-04-15 NOTE — Progress Notes (Signed)
Patient ZO:XWRUE Stacey Palmer, female DOB:07/27/1981, 35 y.o. AVW:[id number]  Chief Complaint  Patient presents with  . Follow-up    Right Thigh    HPI  Stacey Palmer is a 35 y.o. female who has had right hamstring pain after doing the "split" several weeks ago.  She is better but still has pain in the right medial hamstrings.  She has less swelling and less pain but still requires crutches.  She is out of work and will need to continue this.  She has no new trauma.  She is using ice.  I have encouraged her to go swimming.  HPI  Body mass index is 44.08 kg/(m^2).  ROS  Review of Systems  HENT: Negative for congestion.   Respiratory: Negative for cough and shortness of breath.   Cardiovascular: Negative for chest pain and leg swelling.  Endocrine: Positive for cold intolerance.  Musculoskeletal: Positive for myalgias, arthralgias and gait problem.  Allergic/Immunologic: Positive for environmental allergies.  Neurological: Negative for numbness.  Psychiatric/Behavioral: Positive for sleep disturbance. The patient is nervous/anxious.     Past Medical History  Diagnosis Date  . Allergy   . Obesity   . Sleep apnea   . Anxiety     Past Surgical History  Procedure Laterality Date  . Cesarean section    . Tubal ligation      Family History  Problem Relation Age of Onset  . Hypertension Mother   . Kidney disease Father   . Diabetes Father     Social History Social History  Substance Use Topics  . Smoking status: Never Smoker   . Smokeless tobacco: None  . Alcohol Use: No    No Known Allergies  Current Outpatient Prescriptions  Medication Sig Dispense Refill  . azelastine (ASTELIN) 0.1 % nasal spray Place 2 sprays into both nostrils 2 (two) times daily. Use in each nostril as directed 30 mL 4  . clotrimazole-betamethasone (LOTRISONE) cream Apply twice daily to rash for 10 days, then , as needed 45 g 0  . cyclobenzaprine (FLEXERIL) 10 MG tablet Take 1 tablet (10 mg total)  by mouth 2 (two) times daily as needed for muscle spasms. 20 tablet 0  . HYDROcodone-acetaminophen (NORCO/VICODIN) 5-325 MG tablet Take 1 tablet by mouth every 4 (four) hours as needed for moderate pain (Must last 15 days.Do not take and drive a car or use machinery.). 60 tablet 0  . montelukast (SINGULAIR) 10 MG tablet Take 1 tablet (10 mg total) by mouth at bedtime. 30 tablet 4  . naproxen (NAPROSYN) 500 MG tablet Take 1 tablet (500 mg total) by mouth 2 (two) times daily. 30 tablet 0  . venlafaxine (EFFEXOR) 37.5 MG tablet TAKE ONE TABLET BY MOUTH TWICE DAILY ---- 60 tablet 2  . Vitamin D, Ergocalciferol, (DRISDOL) 50000 units CAPS capsule Take 1 capsule (50,000 Units total) by mouth once a week. 4 capsule 4   No current facility-administered medications for this visit.     Physical Exam  Blood pressure 128/81, pulse 73, temperature 97.9 F (36.6 C), height  (1.676 m), weight 273 lb (123.832 kg), last menstrual period 03/03/2016.  Constitutional: overall normal hygiene, normal nutrition, well developed, normal grooming, normal body habitus. Assistive device:crutches  Musculoskeletal: gait and station Limp right, muscle tone and strength are normal, no tremors or atrophy is present.  .  Neurological: coordination overall normal.  Deep tendon reflex/nerve stretch intact.  Sensation normal.  Cranial nerves II-XII intact.   Skin:   normal overall  no scars, lesions, ulcers or rashes. No psoriasis.  Psychiatric: Alert and oriented x 3.  Recent memory intact, remote memory unclear.  Normal mood and affect. Well groomed.  Good eye contact.  Cardiovascular: overall no swelling, no varicosities, no edema bilaterally, normal temperatures of the legs and arms, no clubbing, cyanosis and good capillary refill.  Lymphatic: palpation is normal.  The right lower extremity is examined:  Inspection:  Thigh:  Non-tender and no defects  Knee has swelling 1+ effusion.                         Joint tenderness is present                        Patient is tender over the medial joint line  Lower Leg:  Has normal appearance and no tenderness or defects  Ankle:  Non-tender and no defects  Foot:  Non-tender and no defects Range of Motion:  Knee:  Range of motion is: 0-105                        Crepitus is  present  Ankle:  Range of motion is normal. Strength and Tone:  The right lower extremity has normal strength and tone. Stability:  Knee:  The knee is stable.  Ankle:  The ankle is stable.  The medial hamstring is tender but has less swelling and no defect felt today.  NV is intact.  The patient has been educated about the nature of the problem(s) and counseled on treatment options.  The patient appeared to understand what I have discussed and is in agreement with it.  Encounter Diagnoses  Name Primary?  . Muscle strain of thigh, right, subsequent encounter Yes  . Sleep apnea   . Morbid obesity due to excess calories [facility](HCC)     PLAN Call if any problems.  Precautions discussed.  Continue current medications.   Return to clinic 2 weeks   Continue ice and try walking without crutches.  Stay out of work.  Electronically Signed Darreld McleanWayne , MD 6/13/201711:52 AM

## 2016-04-18 ENCOUNTER — Encounter: Payer: Medicaid Other | Admitting: Family Medicine

## 2016-04-24 ENCOUNTER — Other Ambulatory Visit: Payer: Self-pay | Admitting: Family Medicine

## 2016-04-29 ENCOUNTER — Encounter: Payer: Self-pay | Admitting: Orthopaedic Surgery

## 2016-04-29 ENCOUNTER — Ambulatory Visit (INDEPENDENT_AMBULATORY_CARE_PROVIDER_SITE_OTHER): Payer: Medicaid Other | Admitting: Orthopaedic Surgery

## 2016-04-29 VITALS — BP 127/83 | HR 67 | Temp 97.5°F | Ht 67.0 in | Wt 279.0 lb

## 2016-04-29 DIAGNOSIS — G4730 Sleep apnea, unspecified: Secondary | ICD-10-CM | POA: Diagnosis not present

## 2016-04-29 DIAGNOSIS — S76911D Strain of unspecified muscles, fascia and tendons at thigh level, right thigh, subsequent encounter: Secondary | ICD-10-CM | POA: Diagnosis not present

## 2016-04-29 NOTE — Progress Notes (Signed)
Patient ZD:GUYQI:Stacey Palmer, female DOB:[birth year], 35 y.o. HKV:[id number]  Chief Complaint  Patient presents with  . Follow-up    right knee and thigh     HPI  Stacey Palmer is a 35 y.o. female who has pain in the right hamstrings from doing the "split" several weeks ago.  She had improved until this past Sunday, two days ago, when she fell on wet ground when her crutch slipped under her and she fell and hurt her right thigh again.  She has more pain now.    I have talked to her about going to pool therapy.  She is arranging this.  She will need to stay out of work again.    HPI  Body mass index is 43.69 kg/(m^2).  ROS  Review of Systems  HENT: Negative for congestion.   Respiratory: Negative for cough and shortness of breath.   Cardiovascular: Negative for chest pain and leg swelling.  Endocrine: Positive for cold intolerance.  Musculoskeletal: Positive for myalgias, arthralgias and gait problem.  Allergic/Immunologic: Positive for environmental allergies.  Neurological: Negative for numbness.  Psychiatric/Behavioral: Positive for sleep disturbance. The patient is nervous/anxious.     Past Medical History  Diagnosis Date  . Allergy   . Obesity   . Sleep apnea   . Anxiety     Past Surgical History  Procedure Laterality Date  . Cesarean section    . Tubal ligation      Family History  Problem Relation Age of Onset  . Hypertension Mother   . Kidney disease Father   . Diabetes Father     Social History Social History  Substance Use Topics  . Smoking status: Never Smoker   . Smokeless tobacco: None  . Alcohol Use: No    No Known Allergies  Current Outpatient Prescriptions  Medication Sig Dispense Refill  . azelastine (ASTELIN) 0.1 % nasal spray Place 2 sprays into both nostrils 2 (two) times daily. Use in each nostril as directed 30 mL 4  . clotrimazole-betamethasone (LOTRISONE) cream Apply twice daily to rash for 10 days, then , as needed 45 g 0  .  cyclobenzaprine (FLEXERIL) 10 MG tablet Take 1 tablet (10 mg total) by mouth 2 (two) times daily as needed for muscle spasms. 20 tablet 0  . HYDROcodone-acetaminophen (NORCO/VICODIN) 5-325 MG tablet Take 1 tablet by mouth every 4 (four) hours as needed for moderate pain (Must last 15 days.Do not take and drive a car or use machinery.). 60 tablet 0  . montelukast (SINGULAIR) 10 MG tablet Take 1 tablet (10 mg total) by mouth at bedtime. 30 tablet 4  . naproxen (NAPROSYN) 500 MG tablet Take 1 tablet (500 mg total) by mouth 2 (two) times daily. 30 tablet 0  . venlafaxine (EFFEXOR) 37.5 MG tablet TAKE ONE TABLET BY MOUTH TWICE DAILY ---- 60 tablet 2  . Vitamin D, Ergocalciferol, (DRISDOL) 50000 units CAPS capsule Take 1 capsule (50,000 Units total) by mouth once a week. 4 capsule 4   No current facility-administered medications for this visit.     Physical Exam  Blood pressure 127/83, pulse 67, temperature 97.5 F (36.4 C), height 5\' 7"  (1.702 m), weight 279 lb (126.554 kg).  Constitutional: overall normal hygiene, normal nutrition, well developed, normal grooming, normal body habitus. Assistive device:crutches  Musculoskeletal: gait and station Limp right, muscle tone and strength are normal, no tremors or atrophy is present.  .  Neurological: coordination overall normal.  Deep tendon reflex/nerve stretch intact.  Sensation normal.  Cranial nerves II-XII intact.   Skin:   normal overall no scars, lesions, ulcers or rashes. No psoriasis.  Psychiatric: Alert and oriented x 3.  Recent memory intact, remote memory unclear.  Normal mood and affect. Well groomed.  Good eye contact.  Cardiovascular: overall no swelling, no varicosities, no edema bilaterally, normal temperatures of the legs and arms, no clubbing, cyanosis and good capillary refill.  Lymphatic: palpation is normal.  She has tenderness of the posterior thigh in the hamstrings, both medial and lateral today on the right.  ROM is  tender but nearly full.  No defect appreciated.  Left thigh normal.  NV intact.  The patient has been educated about the nature of the problem(s) and counseled on treatment options.  The patient appeared to understand what I have discussed and is in agreement with it.  Encounter Diagnoses  Name Primary?  . Muscle strain of thigh, right, subsequent encounter Yes  . Sleep apnea   . Morbid obesity due to excess calories [facility](HCC)     PLAN Call if any problems.  Precautions discussed.  Continue current medications.   Return to clinic 2 weeks   Stay out of work.  Electronically Signed Darreld McleanWayne , MD 6/27/20179:19 AM

## 2016-04-29 NOTE — Patient Instructions (Signed)
Out of work 

## 2016-05-05 ENCOUNTER — Telehealth (HOSPITAL_COMMUNITY): Payer: Self-pay | Admitting: *Deleted

## 2016-05-05 NOTE — Telephone Encounter (Signed)
phone call from patient, said she and her daughter is already receiving counseling at [facility].

## 2016-05-09 ENCOUNTER — Ambulatory Visit (INDEPENDENT_AMBULATORY_CARE_PROVIDER_SITE_OTHER): Payer: Medicaid Other | Admitting: Family Medicine

## 2016-05-09 ENCOUNTER — Encounter: Payer: Self-pay | Admitting: Family Medicine

## 2016-05-09 ENCOUNTER — Other Ambulatory Visit (HOSPITAL_COMMUNITY)
Admission: RE | Admit: 2016-05-09 | Discharge: 2016-05-09 | Disposition: A | Discharge status: Home / Self Care | Payer: Medicaid Other | Source: Ambulatory Visit | Attending: Family Medicine | Admitting: Family Medicine

## 2016-05-09 ENCOUNTER — Telehealth: Payer: Self-pay | Admitting: Family Medicine

## 2016-05-09 VITALS — BP 128/80 | HR 70 | Resp 18 | Ht 66.0 in | Wt 291.0 lb

## 2016-05-09 DIAGNOSIS — Z0000 Encounter for general adult medical examination without abnormal findings: Secondary | ICD-10-CM | POA: Diagnosis not present

## 2016-05-09 DIAGNOSIS — H60392 Other infective otitis externa, left ear: Secondary | ICD-10-CM

## 2016-05-09 DIAGNOSIS — R350 Frequency of micturition: Secondary | ICD-10-CM | POA: Diagnosis not present

## 2016-05-09 DIAGNOSIS — Z124 Encounter for screening for malignant neoplasm of cervix: Secondary | ICD-10-CM

## 2016-05-09 DIAGNOSIS — Z01419 Encounter for gynecological examination (general) (routine) without abnormal findings: Secondary | ICD-10-CM | POA: Insufficient documentation

## 2016-05-09 DIAGNOSIS — Z1151 Encounter for screening for human papillomavirus (HPV): Secondary | ICD-10-CM | POA: Diagnosis present

## 2016-05-09 DIAGNOSIS — N760 Acute vaginitis: Secondary | ICD-10-CM | POA: Diagnosis present

## 2016-05-09 DIAGNOSIS — H60399 Other infective otitis externa, unspecified ear: Secondary | ICD-10-CM | POA: Insufficient documentation

## 2016-05-09 LAB — POCT URINALYSIS DIPSTICK
BILIRUBIN UA: NEGATIVE
GLUCOSE UA: NEGATIVE
Ketones, UA: NEGATIVE
LEUKOCYTES UA: NEGATIVE
NITRITE UA: NEGATIVE
PH UA: 6.5
Protein, UA: NEGATIVE
Spec Grav, UA: 1.025
UROBILINOGEN UA: 0.2

## 2016-05-09 MED ORDER — CIPROFLOXACIN-HYDROCORTISONE 0.2-1 % OT SUSP: 3.0000 [drp] | Freq: Two times a day (BID) | OTIC | Status: DC

## 2016-05-09 MED ORDER — CEPHALEXIN 500 MG PO CAPS: 500.0000 mg | ORAL_CAPSULE | Freq: Two times a day (BID) | ORAL | Status: AC

## 2016-05-09 NOTE — Assessment & Plan Note (Signed)

## 2016-05-09 NOTE — Telephone Encounter (Signed)
rescription was sent in at the time of the visit, she needs to check the pharmacy

## 2016-05-09 NOTE — Telephone Encounter (Signed)
Stacey Palmer is asking if Dr. Lodema HongSimpson was going to call her in something for her left ear, please advise?

## 2016-05-09 NOTE — Telephone Encounter (Signed)
Patient is aware 

## 2016-05-09 NOTE — Assessment & Plan Note (Signed)
1 week h/o left ear pain, has been sticking Qtips in the ear also. Canal is erythematous  Antibiotic topical prescribed and re educated re need to avoid trauma to ear

## 2016-05-09 NOTE — Patient Instructions (Addendum)
F/u in 4.5 month,  Call if you need me before  Antibiotic ear drop prescribed for use in left ear for 7 days  Fasting labs due 2nd week in August  Urine and vaginal specimens are being tested for infection  Pap is sent   Thank you  for choosing [facility]. We consider it a privelige to serve you.  Delivering excellent health care in a caring and  compassionate way is our goal.  Partnering with you,  so that together we can achieve this goal is our strategy.  Please work on good  health habits so that your health will improve. 1. Commitment to daily physical activity for 30 to 60  minutes, if you are able to do this.  2. Commitment to wise food choices. Aim for half of your  food intake to be vegetable and fruit, one quarter starchy foods, and one quarter protein. Try to eat on a regular schedule  3 meals per day, snacking between meals should be limited to vegetables or fruits or small portions of nuts. 64 ounces of water per day is generally recommended, unless you have specific health conditions, like heart failure or kidney failure where you will need to limit fluid intake.  3. Commitment to sufficient and a  good quality of physical and mental rest daily, generally between 6 to 8 hours per day.  WITH PERSISTANCE AND PERSEVERANCE, THE IMPOSSIBLE , BECOMES THE NORM!

## 2016-05-10 NOTE — Progress Notes (Signed)
    Stacey Palmer     MRN: [id number]      DOB: 02-08-1981  HPI: Patient is in for annual physical exam. C/o left ear pain, urinary frequency and urgency for past 1 week, no fever , chills or flank pain Ongoing right lower extremity pain and limitation in motion following recent fall, under care of ortho Recent labs, if available are reviewed. Immunization is reviewed , and  updated if needed.   PE: Pleasant  female, alert and oriented x 3, in no cardio-pulmonary distress. Afebrile. HEENT No facial trauma or asymetry. Sinuses non tender.  Extra occullar muscles intact, pupils equally reactive to light. External ears normal,right  tympanic membranes clear, left external ear canal edematous and  erythematous, TM clear Oropharynx moist, no exudate. Neck: supple, no adenopathy,JVD or thyromegaly.No bruits.  Chest: Clear to ascultation bilaterally.No crackles or wheezes. Non tender to palpation  Breast: No asymetry,no masses or lumps. No tenderness. No nipple discharge or inversion. No axillary or supraclavicular adenopathy  Cardiovascular system; Heart sounds normal,  S1 and  S2 ,no S3.  No murmur, or thrill. Apical beat not displaced Peripheral pulses normal.  Abdomen: Soft, non tender, no organomegaly or masses.No suprapubic or renal angle tenderness No bruits. Bowel sounds normal. No guarding, tenderness or rebound.  GU: External genitalia normal female genitalia , normal female distribution of hair. No lesions. Urethral meatus normal in size, no  Prolapse, no lesions visibly  Present. Bladder non tender. Vagina pink and moist , with no visible lesions , discharge present . Adequate pelvic support no  cystocele or rectocele noted Cervix pink and appears healthy, no lesions or ulcerations noted, no discharge noted from os Uterus normal size, no adnexal masses, no cervical motion or adnexal tenderness.   Musculoskeletal exam: Full ROM of spine,decreased ROM right  hip  and knee. No deformity ,swelling or crepitus noted. No muscle wasting or atrophy.   Neurologic: Cranial nerves 2 to 12 intact. Power, tone ,sensation and reflexes normal throughout. No disturbance in gait. No tremor.  Skin: Intact, no ulceration, erythema , scaling or rash noted. Pigmentation normal throughout  Psych; Normal mood and affect. Judgement and concentration normal   Assessment & Plan:  Annual physical exam Annual exam as documented. Counseling done  re healthy lifestyle involving commitment to 150 minutes exercise per week, heart healthy diet, and attaining healthy weight.The importance of adequate sleep also discussed. Regular seat belt use and home safety, is also discussed. Changes in health habits are decided on by the patient with goals and time frames  set for achieving them. Immunization and cancer screening needs are specifically addressed at this visit.   Otitis, externa, infective 1 week h/o left ear pain, has been sticking Qtips in the ear also. Canal is erythematous  Antibiotic topical prescribed and re educated re need to avoid trauma to ear  Morbid obesity Deteriorated. Patient re-educated about  the importance of commitment to a  minimum of 150 minutes of exercise per week.  The importance of healthy food choices with portion control discussed. Encouraged to start a food diary, count calories and to consider  joining a support group. Sample diet sheets offered. Goals set by the patient for the next several months.   Weight /BMI 05/09/2016 04/29/2016 04/15/2016  WEIGHT 291 lb 279 lb 273 lb  HEIGHT 5\' 6"  5\' 7"  5\' 6"   BMI 46.99 kg/m2 43.69 kg/m2 44.08 kg/m2   Form completed in support of bariatric surgery

## 2016-05-10 NOTE — Assessment & Plan Note (Signed)
Symptomatic x 5 days, urinalysis in office is normal, no sign of infection

## 2016-05-10 NOTE — Assessment & Plan Note (Signed)
Deteriorated. Patient re-educated about  the importance of commitment to a  minimum of 150 minutes of exercise per week.  The importance of healthy food choices with portion control discussed. Encouraged to start a food diary, count calories and to consider  joining a support group. Sample diet sheets offered. Goals set by the patient for the next several months.   Weight /BMI 05/09/2016 04/29/2016 04/15/2016  WEIGHT 291 lb 279 lb 273 lb  HEIGHT 5\' 6"  5\' 7"  5\' 6"   BMI 46.99 kg/m2 43.69 kg/m2 44.08 kg/m2   Form completed in support of bariatric surgery

## 2016-05-12 LAB — CYTOLOGY - PAP

## 2016-05-12 LAB — CERVICOVAGINAL ANCILLARY ONLY: Wet Prep (BD Affirm): NEGATIVE

## 2016-05-13 ENCOUNTER — Encounter: Payer: Self-pay | Admitting: Orthopaedic Surgery

## 2016-05-13 ENCOUNTER — Ambulatory Visit (INDEPENDENT_AMBULATORY_CARE_PROVIDER_SITE_OTHER): Payer: Medicaid Other | Admitting: Orthopaedic Surgery

## 2016-05-13 VITALS — BP 135/79 | HR 73 | Temp 97.3°F | Resp 16 | Ht 66.0 in | Wt 287.0 lb

## 2016-05-13 DIAGNOSIS — M25561 Pain in right knee: Secondary | ICD-10-CM

## 2016-05-13 DIAGNOSIS — S76911D Strain of unspecified muscles, fascia and tendons at thigh level, right thigh, subsequent encounter: Secondary | ICD-10-CM

## 2016-05-13 NOTE — Progress Notes (Signed)
Patient ZO:XWRUE:Stacey Palmer, female DOB:10/28/1981, 35 y.o. AVW:[id number]  Chief Complaint  Patient presents with  . Follow-up    right upper leg pain    HPI  Stacey Palmer is a 35 y.o. female who has continued pain of the right medial thigh after doing the "split".  She gets better then gets worse.  She is worse today.  She is using crutches.  She has pain of the medial hamstrings, more pain than before.  I will get MRI of the right thigh as she has not improved.  I am concerned she has a tear of the hamstrings that has not healed.  She is agreeable for getting the MRI.  She has pain medicine.  HPI  Body mass index is 46.35 kg/(m^2).  ROS  Review of Systems  HENT: Negative for congestion.   Respiratory: Negative for cough and shortness of breath.   Cardiovascular: Negative for chest pain and leg swelling.  Endocrine: Positive for cold intolerance.  Musculoskeletal: Positive for myalgias, arthralgias and gait problem.  Allergic/Immunologic: Positive for environmental allergies.  Neurological: Negative for numbness.  Psychiatric/Behavioral: Positive for sleep disturbance. The patient is nervous/anxious.     Past Medical History  Diagnosis Date  . Allergy   . Obesity   . Sleep apnea   . Anxiety     Past Surgical History  Procedure Laterality Date  . Cesarean section    . Tubal ligation      Family History  Problem Relation Age of Onset  . Hypertension Mother   . Kidney disease Father   . Diabetes Father     Social History Social History  Substance Use Topics  . Smoking status: Never Smoker   . Smokeless tobacco: None  . Alcohol Use: No    No Known Allergies  Current Outpatient Prescriptions  Medication Sig Dispense Refill  . azelastine (ASTELIN) 0.1 % nasal spray Place 2 sprays into both nostrils 2 (two) times daily. Use in each nostril as directed 30 mL 4  . cephALEXin (KEFLEX) 500 MG capsule Take 1 capsule (500 mg total) by mouth 2 (two) times daily. 10  capsule 0  . clotrimazole-betamethasone (LOTRISONE) cream Apply twice daily to rash for 10 days, then , as needed 45 g 0  . cyclobenzaprine (FLEXERIL) 10 MG tablet Take 1 tablet (10 mg total) by mouth 2 (two) times daily as needed for muscle spasms. 20 tablet 0  . HYDROcodone-acetaminophen (NORCO/VICODIN) 5-325 MG tablet Take 1 tablet by mouth every 4 (four) hours as needed for moderate pain (Must last 15 days.Do not take and drive a car or use machinery.). 60 tablet 0  . montelukast (SINGULAIR) 10 MG tablet Take 1 tablet (10 mg total) by mouth at bedtime. 30 tablet 4  . naproxen (NAPROSYN) 500 MG tablet Take 1 tablet (500 mg total) by mouth 2 (two) times daily. 30 tablet 0  . venlafaxine (EFFEXOR) 37.5 MG tablet TAKE ONE TABLET BY MOUTH TWICE DAILY ---- 60 tablet 2  . Vitamin D, Ergocalciferol, (DRISDOL) 50000 units CAPS capsule Take 1 capsule (50,000 Units total) by mouth once a week. 4 capsule 4   No current facility-administered medications for this visit.     Physical Exam  Blood pressure 135/79, pulse 73, temperature 97.3 F (36.3 C), resp. rate 16, height 5\' 6"  (1.676 m), weight 287 lb (130.182 kg).  Constitutional: overall normal hygiene, normal nutrition, well developed, normal grooming, normal body habitus. Assistive device:crutches  Musculoskeletal: gait and station Limp right, muscle tone  and strength are normal, no tremors or atrophy is present.  .  Neurological: coordination overall normal.  Deep tendon reflex/nerve stretch intact.  Sensation normal.  Cranial nerves II-XII intact.   Skin:   normal overall no scars, lesions, ulcers or rashes. No psoriasis.  Psychiatric: Alert and oriented x 3.  Recent memory intact, remote memory unclear.  Normal mood and affect. Well groomed.  Good eye contact.  Cardiovascular: overall no swelling, no varicosities, no edema bilaterally, normal temperatures of the legs and arms, no clubbing, cyanosis and good capillary  refill.  Lymphatic: palpation is normal.  She has pain of the right medial thigh more distal third with no defect palpated.  She has no redness.  She has slight swelling.  NV is intact.  The patient has been educated about the nature of the problem(s) and counseled on treatment options.  The patient appeared to understand what I have discussed and is in agreement with it.  Encounter Diagnoses  Name Primary?  . Muscle strain of thigh, right, subsequent encounter   . Right knee pain Yes    PLAN Call if any problems.  Precautions discussed.  Continue current medications.   Return to clinic afer MRI of the right thigh   Electronically Signed Darreld Mclean, MD 7/11/201710:30 AM

## 2016-05-22 ENCOUNTER — Inpatient Hospital Stay: Admission: RE | Admit: 2016-05-22 | Payer: Medicaid Other | Source: Ambulatory Visit

## 2016-05-24 ENCOUNTER — Ambulatory Visit
Admission: RE | Admit: 2016-05-24 | Discharge: 2016-05-24 | Disposition: A | Discharge status: Home / Self Care | Payer: Medicaid Other | Source: Ambulatory Visit | Attending: Orthopaedic Surgery | Admitting: Orthopaedic Surgery

## 2016-05-24 DIAGNOSIS — M25561 Pain in right knee: Secondary | ICD-10-CM

## 2016-05-26 ENCOUNTER — Encounter: Payer: Self-pay | Admitting: Orthopaedic Surgery

## 2016-05-29 ENCOUNTER — Ambulatory Visit: Payer: Medicaid Other | Admitting: Orthopaedic Surgery

## 2016-06-03 ENCOUNTER — Ambulatory Visit: Payer: Medicaid Other | Admitting: Orthopaedic Surgery

## 2016-06-04 ENCOUNTER — Encounter: Payer: Self-pay | Admitting: Orthopaedic Surgery

## 2016-06-04 ENCOUNTER — Ambulatory Visit (INDEPENDENT_AMBULATORY_CARE_PROVIDER_SITE_OTHER): Payer: Medicaid Other | Admitting: Orthopaedic Surgery

## 2016-06-04 VITALS — BP 124/71 | HR 74 | Temp 97.5°F | Ht 66.0 in | Wt 283.4 lb

## 2016-06-04 DIAGNOSIS — S76311D Strain of muscle, fascia and tendon of the posterior muscle group at thigh level, right thigh, subsequent encounter: Secondary | ICD-10-CM | POA: Diagnosis not present

## 2016-06-04 DIAGNOSIS — S76911D Strain of unspecified muscles, fascia and tendons at thigh level, right thigh, subsequent encounter: Secondary | ICD-10-CM

## 2016-06-04 NOTE — Progress Notes (Signed)
Patient Stacey Palmer, female DOB:03/01/1981, 35 y.o. WJX:[id number]  Chief Complaint  Patient presents with  . Results    MRI     HPI  Stacey Palmer is a 35 y.o. female who has had pain of the right hamstrings after doing a "split".  She had a MRI of the right thigh and it shows: IMPRESSION: Strain and partial tear of the right semimembranosus at the musculotendinous junction. The examination is otherwise negative.  I have explained the findings to her.  I will begin PT for her.  She is using one crutch now. HPI  Body mass index is 45.74 kg/m.  ROS  Review of Systems  HENT: Negative for congestion.   Respiratory: Negative for cough and shortness of breath.   Cardiovascular: Negative for chest pain and leg swelling.  Endocrine: Positive for cold intolerance.  Musculoskeletal: Positive for arthralgias, gait problem and myalgias.  Allergic/Immunologic: Positive for environmental allergies.  Neurological: Negative for numbness.  Psychiatric/Behavioral: Positive for sleep disturbance. The patient is nervous/anxious.     Past Medical History:  Diagnosis Date  . Allergy   . Anxiety   . Obesity   . Sleep apnea     Past Surgical History:  Procedure Laterality Date  . CESAREAN SECTION    . TUBAL LIGATION      Family History  Problem Relation Age of Onset  . Hypertension Mother   . Kidney disease Father   . Diabetes Father     Social History Social History  Substance Use Topics  . Smoking status: Never Smoker  . Smokeless tobacco: Never Used  . Alcohol use No    No Known Allergies  Current Outpatient Prescriptions  Medication Sig Dispense Refill  . azelastine (ASTELIN) 0.1 % nasal spray Place 2 sprays into both nostrils 2 (two) times daily. Use in each nostril as directed 30 mL 4  . clotrimazole-betamethasone (LOTRISONE) cream Apply twice daily to rash for 10 days, then , as needed 45 g 0  . cyclobenzaprine (FLEXERIL) 10 MG tablet Take 1 tablet (10 mg total)  by mouth 2 (two) times daily as needed for muscle spasms. 20 tablet 0  . HYDROcodone-acetaminophen (NORCO/VICODIN) 5-325 MG tablet Take 1 tablet by mouth every 4 (four) hours as needed for moderate pain (Must last 15 days.Do not take and drive a car or use machinery.). 60 tablet 0  . montelukast (SINGULAIR) 10 MG tablet Take 1 tablet (10 mg total) by mouth at bedtime. 30 tablet 4  . naproxen (NAPROSYN) 500 MG tablet Take 1 tablet (500 mg total) by mouth 2 (two) times daily. 30 tablet 0  . venlafaxine (EFFEXOR) 37.5 MG tablet TAKE ONE TABLET BY MOUTH TWICE DAILY ---- 60 tablet 2  . Vitamin D, Ergocalciferol, (DRISDOL) 50000 units CAPS capsule Take 1 capsule (50,000 Units total) by mouth once a week. 4 capsule 4   No current facility-administered medications for this visit.      Physical Exam  Blood pressure 124/71, pulse 74, temperature 97.5 F (36.4 C), height 5\' 6"  (1.676 m), weight 283 lb 6.4 oz (128.5 kg).  Constitutional: overall normal hygiene, normal nutrition, well developed, normal grooming, normal body habitus. Assistive device:crutches  Musculoskeletal: gait and station Limp right, muscle tone and strength are normal, no tremors or atrophy is present.  .  Neurological: coordination overall normal.  Deep tendon reflex/nerve stretch intact.  Sensation normal.  Cranial nerves II-XII intact.   Skin:   normal overall no scars, lesions, ulcers or rashes. No  psoriasis.  Psychiatric: Alert and oriented x 3.  Recent memory intact, remote memory unclear.  Normal mood and affect. Well groomed.  Good eye contact.  Cardiovascular: overall no swelling, no varicosities, no edema bilaterally, normal temperatures of the legs and arms, no clubbing, cyanosis and good capillary refill.  Lymphatic: palpation is normal.  She has pain of the right medial thigh with no redness or appreciated swelling. She has obesity.  I cannot feel a defect medially.    The patient has been educated about the  nature of the problem(s) and counseled on treatment options.  The patient appeared to understand what I have discussed and is in agreement with it.  Encounter Diagnoses  Name Primary?  . Hamstring strain, right, subsequent encounter Yes  . Morbid obesity due to excess calories (HCC)   . Muscle strain of thigh, right, subsequent encounter     PLAN Call if any problems.  Precautions discussed.  Continue current medications.   Return to clinic 1 month   She is planning to have bariatric surgery on August 14 in [place].  Electronically Signed Darreld Mclean, MD 8/2/201710:53 AM

## 2016-06-18 ENCOUNTER — Ambulatory Visit (HOSPITAL_COMMUNITY): Payer: Medicaid Other | Attending: Orthopaedic Surgery | Admitting: Physical Therapy

## 2016-06-18 DIAGNOSIS — M25561 Pain in right knee: Secondary | ICD-10-CM | POA: Insufficient documentation

## 2016-06-24 ENCOUNTER — Ambulatory Visit (HOSPITAL_COMMUNITY): Payer: Medicaid Other

## 2016-06-25 ENCOUNTER — Ambulatory Visit (HOSPITAL_COMMUNITY): Payer: Medicaid Other | Admitting: Physical Therapy

## 2016-06-25 DIAGNOSIS — M25561 Pain in right knee: Secondary | ICD-10-CM

## 2016-06-25 NOTE — Therapy (Signed)
[facility] [address] [place], [place], [address] Phone: [phone]   Fax:  [phone]  Physical Therapy Evaluation  Patient Details  Name: Stacey Palmer MRN: [id number] Date of Birth: 16-Oct-1981 Referring Provider: Dr Fuller CanadaStanley Harrison   Encounter Date: 06/25/2016      PT End of Session - 06/25/16 1202    Visit Number 1   Number of Visits 1   Date for PT Re-Evaluation 07/25/16   Authorization Type medicaid   Authorization - Visit Number 1   PT Start Time 1120   PT Stop Time 1200   PT Time Calculation (min) 40 min   Activity Tolerance Patient tolerated treatment well   Behavior During Therapy [facility] for tasks assessed/performed      Past Medical History:  Diagnosis Date  . Allergy   . Anxiety   . Obesity   . Sleep apnea     Past Surgical History:  Procedure Laterality Date  . CESAREAN SECTION    . TUBAL LIGATION      There were no vitals filed for this visit.       Subjective Assessment - 06/25/16 1122    Subjective Stacey Palmer states that she fell in April and injured her right hamstring.  She states that the pain is intermittent but was not going away therefore she had an MRI which showed a slight tear.  Her MD has referred her to therapy.    Pertinent History unremarkable    How long can you stand comfortably? Able to stand for up to 40 minutes    How long can you walk comfortably? able to walk for an hour    Patient Stated Goals for it to be easier to go up and down her steps and hills, less pain             [facility] PT Assessment - 06/25/16 0001      Assessment   Medical Diagnosis Rt hamstring strain    Referring Provider Dr Fuller CanadaStanley Harrison    Onset Date/Surgical Date 03/31/16   Next MD Visit 07/02/2016   Prior Therapy none     Precautions   Precautions --  gastroc sleeve      Restrictions   Weight Bearing Restrictions No     Balance Screen   Has the patient fallen in the past 6 months Yes   How  many times? 1   Has the patient had a decrease in activity level because of a fear of falling?  Yes   Is the patient reluctant to leave their home because of a fear of falling?  No     Home [employer] managernvironment   Living Environment Private residence     Prior Function   Vocation Full time employment   Vocation Requirements [employer]    Leisure exercise and walking      Cognition   Overall Cognitive Status Within Functional Limits for tasks assessed     ROM / Strength   AROM / PROM / Strength AROM;Strength     AROM   AROM Assessment Site Knee     Strength   Strength Assessment Site Hip;Knee   Right/Left Hip Right   Right Hip Flexion 4/5   Right Hip Extension 4/5   Right Hip ABduction 4/5   Right/Left Knee Right   Right Knee Extension 4/5     Flexibility   Soft Tissue Assessment /Muscle Length yes   Hamstrings Rt 170; Lt 170  PT Education - 06/25/16 1201    Education provided Yes   Education Details HEP   Person(s) Educated Patient   Methods Explanation;Handout;Verbal cues   Comprehension Verbalized understanding;Returned demonstration          PT Short Term Goals - 06/25/16 1207      PT SHORT TERM GOAL #1   Title Independent in home exercise program to increase activity tolerance to prior to fall in order to return to work.    Time 1   Period Weeks   Status New                  Plan - 06/25/16 1202    Clinical Impression Statement Stacey Palmer is a 35 yo female who fell in May injuring her Rt leg.  She states that she was unable to walk after the injury and felt that she had fx something.  There were no fx but a slight hamstring tear.  She currently comes into the department walking with no assistive device but states that she does use a crutch every now and then.  The therapist advised pt that she did not need to use the crutch as her strength was strong enough to ambulate without an assistive device.  Pt  demonstrated good but not normal strength in her Rt LE.  She was given a HEP to strengthen her LE.    Rehab Potential Good   PT Frequency 1x / week   PT Duration --  1 week    PT Treatment/Interventions Patient/family education;Therapeutic exercise   PT Next Visit Plan Pt will only be seen for one visit due to insurance restrictions.    PT Home Exercise Plan HEP  Including sit to stand, standing knee flexion,LAQ, SLR, hip abduction and hamstring stretch.    Consulted and Agree with Plan of Care Patient      Patient will benefit from skilled therapeutic intervention in order to improve the following deficits and impairments:  Decreased activity tolerance, Decreased strength, Pain  Visit Diagnosis: Pain in right knee - Plan: PT plan of care cert/re-cert     Problem List Patient Active Problem List   Diagnosis Date Noted  . Urinary frequency 05/10/2016  . Annual physical exam 05/09/2016  . Otitis, externa, infective 05/09/2016  . Recurrent falls 03/25/2016  . Back pain 03/25/2016  . Pain in joint, lower leg 03/25/2016  . Sleep apnea 06/11/2015  . Morbid obesity (HCC) 06/11/2015  . Foot pain, bilateral 06/11/2015  . 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 Skin tag 06/05/2014  . Allergic rhinitis 06/05/2014  . Depression 05/09/2013  . FATIGUE 04/04/2010   Virgina Organynthia Mikolaj Woolstenhulme, PT CLT [phone] 06/25/2016, 12:11 PM  [facility] [address] [place], [place], [address] Phone: [phone]   Fax:  [phone]  Name: Stacey Palmer MRN: [id number] Date of Birth: 1981/06/08

## 2016-06-25 NOTE — Patient Instructions (Addendum)
Knee Extension (Sitting)    Place ___2_ pound weight on left ankle and straighten knee fully, lower slowly. Repeat ___10_ times per set. Do ___1_ sets per session. Do __2__ sessions per day.  http://orth.exer.us/732   Copyright  VHI. All rights reserved.  Stretching: Hamstring (Supine)    Supporting right thigh behind knee, slowly straighten knee until stretch is felt in back of thigh. Hold __30__ seconds. Repeat __3__ times per set. Do ___1_ sets per session. Do ____ sessions per day. 2 http://orth.exer.us/656   Copyright  VHI. All rights reserved.  Strengthening: Straight Leg Raise (Phase 1)    Tighten muscles on front of right thigh, then lift leg __18__ inches from surface, keeping knee locked.  Repeat _10___ times per set. Do _1___ sets per session. Do __2__ sessions per day.  http://orth.exer.us/614   Copyright  VHI. All rights reserved.  Bridging    Slowly raise buttocks from floor, keeping stomach tight. Repeat _10___ times per set. Do _1___ sets per session. Do _2___ sessions per day.  http://orth.exer.us/1096   Copyright  VHI. All rights reserved.  Self-Mobilization: Knee Flexion (Prone)    Bring right heel toward buttocks as close as possible. Hold __3__ seconds. Relax. Repeat _10___ times per set. Do _1___ sets per session. Do __2__ sessions per day.  http://orth.exer.us/596   Copyright  VHI. All rights reserved.  Strengthening: Hip Extension (Prone)    Tighten muscles on front of right  thigh, then lift leg _2___ inches from surface, keeping knee locked. Repeat __10__ times per set. Do _1___ sets per session. Do ___2_ sessions per day.  http://orth.exer.us/620   Copyright  VHI. All rights reserved.  Functional Quadriceps: Sit to Stand    Sit on edge of chair, feet flat on floor. Stand upright, extending knees fully. Repeat __10__ times per set. Do _1___ sets per session. Do __2__ sessions per day.  http://orth.exer.us/734    Copyright  VHI. All rights reserved.  Knee Flexion: Resisted (Standing)    With support, _2___ pound weight around right ankle, slowly bend knee up. Return slowly.  Repeat 10____ times per set. Do ___1_ sets per session. Do _2___ sessions per day.  http://orth.exer.us/740   Copyright  VHI. All rights reserved.

## 2016-07-02 ENCOUNTER — Encounter: Payer: Self-pay | Admitting: Orthopaedic Surgery

## 2016-07-02 ENCOUNTER — Ambulatory Visit (INDEPENDENT_AMBULATORY_CARE_PROVIDER_SITE_OTHER): Payer: Medicaid Other | Admitting: Orthopaedic Surgery

## 2016-07-02 VITALS — BP 116/66 | HR 90 | Temp 97.2°F | Ht 66.0 in | Wt 267.0 lb

## 2016-07-02 DIAGNOSIS — S76311D Strain of muscle, fascia and tendon of the posterior muscle group at thigh level, right thigh, subsequent encounter: Secondary | ICD-10-CM

## 2016-07-02 NOTE — Progress Notes (Signed)
Patient ZO:XWRUE Stacey Palmer, female DOB:06/30/1981, 35 y.o. AVW:[id number]  Chief Complaint  Patient presents with  . Follow-up    right hamstring injury    HPI  Stacey Palmer is a 35 y.o. female who has had a partial tear of the medial hamstrings on the right.  She is doing better.  She is still using a crutch.  She had abdominal surgery recently.  She is walking more now.  She still has slight medial thigh pain on the right but is better.  She has no new trauma.  She has no numbness or redness. HPI  Body mass index is 43.09 kg/m.  ROS  Review of Systems  HENT: Negative for congestion.   Respiratory: Negative for cough and shortness of breath.   Cardiovascular: Negative for chest pain and leg swelling.  Endocrine: Positive for cold intolerance.  Musculoskeletal: Positive for arthralgias, gait problem and myalgias.  Allergic/Immunologic: Positive for environmental allergies.  Neurological: Negative for numbness.  Psychiatric/Behavioral: Positive for sleep disturbance. The patient is nervous/anxious.     Past Medical History:  Diagnosis Date  . Allergy   . Anxiety   . Obesity   . Sleep apnea     Past Surgical History:  Procedure Laterality Date  . CESAREAN SECTION    . TUBAL LIGATION      Family History  Problem Relation Age of Onset  . Hypertension Mother   . Kidney disease Father   . Diabetes Father     Social History Social History  Substance Use Topics  . Smoking status: Never Smoker  . Smokeless tobacco: Never Used  . Alcohol use No    No Known Allergies  Current Outpatient Prescriptions  Medication Sig Dispense Refill  . azelastine (ASTELIN) 0.1 % nasal spray Place 2 sprays into both nostrils 2 (two) times daily. Use in each nostril as directed 30 mL 4  . clotrimazole-betamethasone (LOTRISONE) cream Apply twice daily to rash for 10 days, then , as needed 45 g 0  . cyclobenzaprine (FLEXERIL) 10 MG tablet Take 1 tablet (10 mg total) by mouth 2 (two) times  daily as needed for muscle spasms. 20 tablet 0  . HYDROcodone-acetaminophen (NORCO/VICODIN) 5-325 MG tablet Take 1 tablet by mouth every 4 (four) hours as needed for moderate pain (Must last 15 days.Do not take and drive a car or use machinery.). 60 tablet 0  . montelukast (SINGULAIR) 10 MG tablet Take 1 tablet (10 mg total) by mouth at bedtime. 30 tablet 4  . naproxen (NAPROSYN) 500 MG tablet Take 1 tablet (500 mg total) by mouth 2 (two) times daily. 30 tablet 0  . venlafaxine (EFFEXOR) 37.5 MG tablet TAKE ONE TABLET BY MOUTH TWICE DAILY ---- 60 tablet 2  . Vitamin D, Ergocalciferol, (DRISDOL) 50000 units CAPS capsule Take 1 capsule (50,000 Units total) by mouth once a week. 4 capsule 4   No current facility-administered medications for this visit.      Physical Exam  Blood pressure 116/66, pulse 90, temperature 97.2 F (36.2 C), height 5\' 6"  (1.676 m), weight 267 lb (121.1 kg).  Constitutional: overall normal hygiene, normal nutrition, well developed, normal grooming, normal body habitus. Assistive device:crutches  Musculoskeletal: gait and station Limp right, muscle tone and strength are normal, no tremors or atrophy is present.  .  Neurological: coordination overall normal.  Deep tendon reflex/nerve stretch intact.  Sensation normal.  Cranial nerves II-XII intact.   Skin:   normal overall no scars, lesions, ulcers or rashes. No psoriasis.  Psychiatric: Alert and oriented x 3.  Recent memory intact, remote memory unclear.  Normal mood and affect. Well groomed.  Good eye contact.  Cardiovascular: overall no swelling, no varicosities, no edema bilaterally, normal temperatures of the legs and arms, no clubbing, cyanosis and good capillary refill.  Lymphatic: palpation is normal.  She has tenderness of the medial hamstrings on the right mid thigh but no defect is felt.  NV intact.  She has edema or redness.   The patient has been educated about the nature of the problem(s) and  counseled on treatment options.  The patient appeared to understand what I have discussed and is in agreement with it.  Encounter Diagnoses  Name Primary?  . Hamstring strain, right, subsequent encounter Yes  . Morbid obesity due to excess calories [facility](HCC)     PLAN Call if any problems.  Precautions discussed.  Continue current medications.   Return to clinic 1 month   Electronically Signed Darreld McleanWayne , MD 8/30/20179:49 AM

## 2016-07-11 ENCOUNTER — Encounter: Payer: Self-pay | Admitting: Family Medicine

## 2016-07-11 ENCOUNTER — Ambulatory Visit (INDEPENDENT_AMBULATORY_CARE_PROVIDER_SITE_OTHER): Payer: Medicaid Other | Admitting: Family Medicine

## 2016-07-11 VITALS — BP 118/80 | HR 88 | Temp 98.6°F | Resp 16 | Ht 66.0 in | Wt 267.0 lb

## 2016-07-11 DIAGNOSIS — Z9884 Bariatric surgery status: Secondary | ICD-10-CM

## 2016-07-11 DIAGNOSIS — J069 Acute upper respiratory infection, unspecified: Secondary | ICD-10-CM

## 2016-07-11 DIAGNOSIS — B9789 Other viral agents as the cause of diseases classified elsewhere: Principal | ICD-10-CM

## 2016-07-11 MED ORDER — CETIRIZINE HCL 10 MG PO TABS
10.0000 mg | ORAL_TABLET | Freq: Every day | ORAL | 0 refills | Status: DC
Start: 2016-07-11 — End: 2017-11-19

## 2016-07-11 MED ORDER — FLUTICASONE PROPIONATE 50 MCG/ACT NA SUSP: 2.0000 | Freq: Every day | NASAL | 0 refills | Status: DC

## 2016-07-11 NOTE — Patient Instructions (Signed)
Try ti drink enough water  Use the Flonase twice a day for congestion until symptoms improve The cetirizine will reduce the runny nose  Call if not improving by next week

## 2016-07-11 NOTE — Progress Notes (Signed)
Chief Complaint  Patient presents with  . Nasal Congestion    x 2 days, clear drainage, cough producing white phlegm, chills, no fever    Patient is here for an acute visit. She had bariatric surgery less than a month ago. She is doing reasonably well, but still has difficulty swallowing solids, is on a liquid diet. She only eats small amounts at a time. She is losing weight. She is here today for a "bad cold". Cough cold runny nose sinus congestion postnasal drip and fever. She doesn't know what medicine she can take. She feels miserable. She doesn't have any primary discharge. No sweats chills or fever. She does have underlying allergies. She is not using any allergy medication. She is a nonsmoker with no underlying asthma or COPD. She hasn't had any vomiting. She has had a decreased by mouth intake. The importance of continuing to drink sips of fluids throughout the day is emphasized to her. Her GI laparoscopy incisions are healing well. No drainage. No problems with bowels or bladder.  Patient Active Problem List   Diagnosis Date Noted  . Bariatric surgery status 07/11/2016  . Urinary frequency 05/10/2016  . Annual physical exam 05/09/2016  . Otitis, externa, infective 05/09/2016  . Recurrent falls 03/25/2016  . Back pain 03/25/2016  . Pain in joint, lower leg 03/25/2016  . Sleep apnea 06/11/2015  . Morbid obesity (HCC) 06/11/2015  . Foot pain, bilateral 06/11/2015  . 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 Skin tag 06/05/2014  . Allergic rhinitis 06/05/2014  . Depression 05/09/2013  . FATIGUE 04/04/2010    Outpatient Encounter Prescriptions as of 07/11/2016  Medication Sig  . azelastine (ASTELIN) 0.1 % nasal spray Place 2 sprays into both nostrils 2 (two) times daily. Use in each nostril as directed  . clotrimazole-betamethasone (LOTRISONE) cream Apply twice daily to rash for 10 days, then , as needed  . cyclobenzaprine (FLEXERIL) 10 MG tablet Take 1 tablet (10 mg total) by  mouth 2 (two) times daily as needed for muscle spasms.  Marland Kitchen. HYDROcodone-acetaminophen (NORCO/VICODIN) 5-325 MG tablet Take 1 tablet by mouth every 4 (four) hours as needed for moderate pain (Must last 15 days.Do not take and drive a car or use machinery.).  Marland Kitchen. montelukast (SINGULAIR) 10 MG tablet Take 1 tablet (10 mg total) by mouth at bedtime.  Marland Kitchen. venlafaxine (EFFEXOR) 37.5 MG tablet TAKE ONE TABLET BY MOUTH TWICE DAILY ----  . Vitamin D, Ergocalciferol, (DRISDOL) 50000 units CAPS capsule Take 1 capsule (50,000 Units total) by mouth once a week.  . cetirizine (ZYRTEC) 10 MG tablet Take 1 tablet (10 mg total) by mouth daily.  . fluticasone (FLONASE) 50 MCG/ACT nasal spray Place 2 sprays into both nostrils daily.   No facility-administered encounter medications on file as of 07/11/2016.     No Known Allergies  Review of Systems  Constitutional: Positive for activity change, appetite change and chills. Negative for fever.  HENT: Positive for congestion, ear pain, postnasal drip, rhinorrhea and sinus pressure. Negative for sore throat.   Eyes: Negative for redness and itching.  Respiratory: Positive for cough. Negative for chest tightness and shortness of breath.   Cardiovascular: Negative for chest pain, palpitations and leg swelling.  Gastrointestinal: Negative for abdominal pain, constipation and diarrhea.  Genitourinary: Negative for difficulty urinating and dysuria.  Musculoskeletal: Negative for arthralgias and back pain.  Neurological: Positive for headaches. Negative for light-headedness.  Hematological: Negative for adenopathy. Does not bruise/bleed easily.    BP 118/80  Pulse 88   Temp 98.6 F (37 C) (Oral)   Resp 16   Ht 5\' 6"  (1.676 m)   Wt 267 lb (121.1 kg)   SpO2 98%   BMI 43.09 kg/m   Physical Exam  Constitutional: She is oriented to person, place, and time. She appears well-developed and well-nourished.  Obese. Pleasant. Appears uncomfortable from her cold symptoms.    HENT:  Head: Normocephalic and atraumatic.  Right Ear: External ear normal.  Left Ear: External ear normal.  Nasal membranes congested and red. Clear drainage. Mild sinus tenderness. Posterior pharynx mildly injected. No exudate.  Eyes: Conjunctivae are normal. Pupils are equal, round, and reactive to light.  Neck: Normal range of motion. Neck supple. No thyromegaly present.  Mildly tender cervical nodes  Cardiovascular: Normal rate, regular rhythm and normal heart sounds.   Pulmonary/Chest: Effort normal and breath sounds normal.  Abdominal: Soft. Bowel sounds are normal.  ports healing well  Neurological: She is alert and oriented to person, place, and time.  Psychiatric: She has a normal mood and affect. Her behavior is normal.    1. Viral URI with cough   2. Bariatric surgery status    Patient Instructions  Try ti drink enough water  Use the Flonase twice a day for congestion until symptoms improve The cetirizine will reduce the runny nose  Call if not improving by next week     Eustace Moore, MD

## 2016-07-23 DIAGNOSIS — Z9884 Bariatric surgery status: Secondary | ICD-10-CM | POA: Insufficient documentation

## 2016-07-24 ENCOUNTER — Ambulatory Visit: Payer: Managed Care, Other (non HMO) | Admitting: Family Medicine

## 2016-07-30 ENCOUNTER — Ambulatory Visit: Payer: Medicaid Other | Admitting: Orthopaedic Surgery

## 2016-08-06 ENCOUNTER — Ambulatory Visit (INDEPENDENT_AMBULATORY_CARE_PROVIDER_SITE_OTHER): Payer: Medicaid Other

## 2016-08-06 DIAGNOSIS — Z23 Encounter for immunization: Secondary | ICD-10-CM

## 2016-08-12 ENCOUNTER — Ambulatory Visit: Payer: Medicaid Other | Admitting: Orthopaedic Surgery

## 2016-08-13 ENCOUNTER — Ambulatory Visit: Payer: Medicaid Other | Admitting: Orthopaedic Surgery

## 2016-08-27 ENCOUNTER — Encounter: Payer: Self-pay | Admitting: Orthopaedic Surgery

## 2016-08-27 ENCOUNTER — Ambulatory Visit (INDEPENDENT_AMBULATORY_CARE_PROVIDER_SITE_OTHER): Payer: Medicaid Other | Admitting: Orthopaedic Surgery

## 2016-08-27 VITALS — BP 146/86 | HR 55 | Ht 66.0 in | Wt 244.0 lb

## 2016-08-27 DIAGNOSIS — S76911D Strain of unspecified muscles, fascia and tendons at thigh level, right thigh, subsequent encounter: Secondary | ICD-10-CM

## 2016-08-27 DIAGNOSIS — M25561 Pain in right knee: Secondary | ICD-10-CM

## 2016-08-27 DIAGNOSIS — G8929 Other chronic pain: Secondary | ICD-10-CM

## 2016-08-27 MED ORDER — HYDROCODONE-ACETAMINOPHEN 5-325 MG PO TABS: 1.0000 | ORAL_TABLET | ORAL | 0 refills | Status: DC | PRN

## 2016-08-27 NOTE — Progress Notes (Signed)
Patient ZO:XWRUE Stacey Palmer, female DOB:April 28, 1981, 35 y.o. AVW:[id number]  Chief Complaint  Patient presents with  . Follow-up    Right hamstring    HPI  Stacey Palmer is a 35 y.o. female who has resolving right hamstring strain and pain.  She is doing much better.  She has begun weight reduction and lost 25 pounds since last seen.  [facility].  She is very agreeable to this.  I have also recommended walking program.  She still has one crutch and I told her to get off it.  I watched her walk without it today and she did well. HPI  Body mass index is 39.38 kg/m.  ROS  Review of Systems  HENT: Negative for congestion.   Respiratory: Negative for cough and shortness of breath.   Cardiovascular: Negative for chest pain and leg swelling.  Endocrine: Positive for cold intolerance.  Musculoskeletal: Positive for arthralgias, gait problem and myalgias.  Allergic/Immunologic: Positive for environmental allergies.  Neurological: Negative for numbness.  Psychiatric/Behavioral: Positive for sleep disturbance. The patient is nervous/anxious.     Past Medical History:  Diagnosis Date  . Allergy   . Anxiety   . Obesity   . Sleep apnea     Past Surgical History:  Procedure Laterality Date  . CESAREAN SECTION    . TUBAL LIGATION      Family History  Problem Relation Age of Onset  . Hypertension Mother   . Kidney disease Father   . Diabetes Father     Social History Social History  Substance Use Topics  . Smoking status: Never Smoker  . Smokeless tobacco: Never Used  . Alcohol use No    No Known Allergies  Current Outpatient Prescriptions  Medication Sig Dispense Refill  . azelastine (ASTELIN) 0.1 % nasal spray Place 2 sprays into both nostrils 2 (two) times daily. Use in each nostril as directed 30 mL 4  . cetirizine (ZYRTEC) 10 MG tablet Take 1 tablet (10 mg total) by mouth daily. 20 tablet 0  . clotrimazole-betamethasone  (LOTRISONE) cream Apply twice daily to rash for 10 days, then , as needed 45 g 0  . cyclobenzaprine (FLEXERIL) 10 MG tablet Take 1 tablet (10 mg total) by mouth 2 (two) times daily as needed for muscle spasms. 20 tablet 0  . fluticasone (FLONASE) 50 MCG/ACT nasal spray Place 2 sprays into both nostrils daily. 16 g 0  . HYDROcodone-acetaminophen (NORCO/VICODIN) 5-325 MG tablet Take 1 tablet by mouth every 4 (four) hours as needed for moderate pain (Must last 15 days.Do not take and drive a car or use machinery.). 60 tablet 0  . montelukast (SINGULAIR) 10 MG tablet Take 1 tablet (10 mg total) by mouth at bedtime. 30 tablet 4  . venlafaxine (EFFEXOR) 37.5 MG tablet TAKE ONE TABLET BY MOUTH TWICE DAILY ---- 60 tablet 2  . Vitamin D, Ergocalciferol, (DRISDOL) 50000 units CAPS capsule Take 1 capsule (50,000 Units total) by mouth once a week. 4 capsule 4   No current facility-administered medications for this visit.      Physical Exam  Blood pressure (!) 146/86, pulse (!) 55, height 5\' 6"  (1.676 m), weight 244 lb (110.7 kg).  Constitutional: overall normal hygiene, normal nutrition, well developed, normal grooming, normal body habitus. Assistive device:crutches, one crutch  Musculoskeletal: gait and station Limp none, muscle tone and strength are normal, no tremors or atrophy is present.  .  Neurological: coordination overall normal.  Deep tendon reflex/nerve stretch intact.  Sensation normal.  Cranial nerves II-XII intact.   Skin:   Normal overall no scars, lesions, ulcers or rashes. No psoriasis.  Psychiatric: Alert and oriented x 3.  Recent memory intact, remote memory unclear.  Normal mood and affect. Well groomed.  Good eye contact.  Cardiovascular: overall no swelling, no varicosities, no edema bilaterally, normal temperatures of the legs and arms, no clubbing, cyanosis and good capillary refill.  Lymphatic: palpation is normal.  Her right medial hamstring is tender but has no spasm  or defect.  ROM of the right knee is full.  She has no limp.  NV intact.  The patient has been educated about the nature of the problem(s) and counseled on treatment options.  The patient appeared to understand what I have discussed and is in agreement with it.  Encounter Diagnoses  Name Primary?  . Muscle strain of thigh, right, subsequent encounter Yes  . Chronic pain of right knee     PLAN Call if any problems.  Precautions discussed.  Continue current medications.   Return to clinic 1 month   Electronically Signed Darreld McleanWayne , MD 10/25/20179:31 AM

## 2016-09-06 ENCOUNTER — Other Ambulatory Visit: Payer: Self-pay | Admitting: Family Medicine

## 2016-09-23 ENCOUNTER — Ambulatory Visit: Payer: Medicaid Other | Admitting: Family Medicine

## 2016-09-24 ENCOUNTER — Encounter: Payer: Self-pay | Admitting: Orthopaedic Surgery

## 2016-09-24 ENCOUNTER — Ambulatory Visit: Payer: Medicaid Other | Admitting: Orthopaedic Surgery

## 2016-09-29 ENCOUNTER — Ambulatory Visit: Payer: Medicaid Other | Admitting: Family Medicine

## 2016-09-30 ENCOUNTER — Encounter: Payer: Self-pay | Admitting: Family Medicine

## 2016-09-30 ENCOUNTER — Ambulatory Visit (INDEPENDENT_AMBULATORY_CARE_PROVIDER_SITE_OTHER): Payer: Medicaid Other | Admitting: Family Medicine

## 2016-09-30 VITALS — BP 138/96 | HR 64 | Temp 97.8°F | Resp 18 | Ht 66.0 in | Wt 230.0 lb

## 2016-09-30 DIAGNOSIS — B9789 Other viral agents as the cause of diseases classified elsewhere: Secondary | ICD-10-CM | POA: Diagnosis not present

## 2016-09-30 DIAGNOSIS — J069 Acute upper respiratory infection, unspecified: Secondary | ICD-10-CM

## 2016-09-30 MED ORDER — AMOXICILLIN 500 MG PO CAPS: 500.0000 mg | ORAL_CAPSULE | Freq: Three times a day (TID) | ORAL | 0 refills | Status: DC

## 2016-09-30 MED ORDER — FLUCONAZOLE 150 MG PO TABS: 150.0000 mg | ORAL_TABLET | Freq: Once | ORAL | 0 refills | Status: AC

## 2016-09-30 NOTE — Patient Instructions (Addendum)
Be sure to drink enough water  Take the amoxicillin 3 times a day  See the nutrition specialist as scheduled,  This is important.

## 2016-09-30 NOTE — Progress Notes (Signed)
Chief Complaint  Patient presents with  . Cough    x 3 days   Patient is here for an acute visit. She had bariatric surgery in August. She is doing well. She is losing weight. Down 37 lbs from last visit.  She is here today for a "bad cold". Cough cold runny nose sinus congestion postnasal drip and fever.  Yellow PND.  Sore throat and nausea.  Headache.  Hoarse voice. She feels miserable.  No sweats chills or fever. She does have underlying allergies. She is not using any allergy medication. She is a nonsmoker with no underlying asthma or COPD. She hasn't had any vomiting.   Her GI laparoscopy incisions are healed well.  No problems with bowels or bladder.  Patient Active Problem List   Diagnosis Date Noted  . Bariatric surgery status 07/11/2016  . Annual physical exam 05/09/2016  . Recurrent falls 03/25/2016  . Back pain 03/25/2016  . Pain in joint, lower leg 03/25/2016  . Sleep apnea 06/11/2015  . Morbid obesity (HCC) 06/11/2015  . Foot pain, bilateral 06/11/2015  . 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 Skin tag 06/05/2014  . Allergic rhinitis 06/05/2014  . Depression 05/09/2013  . FATIGUE 04/04/2010    Outpatient Encounter Prescriptions as of 07/11/2016  Medication Sig  . clotrimazole-betamethasone (LOTRISONE) cream Apply twice daily to rash for 10 days, then , as needed  . montelukast (SINGULAIR) 10 MG tablet Take 1 tablet (10 mg total) by mouth at bedtime.  Marland Kitchen. venlafaxine (EFFEXOR) 37.5 MG tablet TAKE ONE TABLET BY MOUTH TWICE DAILY ----  . Vitamin D, Ergocalciferol, (DRISDOL) 50000 units CAPS capsule Take 1 capsule (50,000 Units total) by mouth once a week.  . cetirizine (ZYRTEC) 10 MG tablet Take 1 tablet (10 mg total) by mouth daily.  . fluticasone (FLONASE) 50 MCG/ACT nasal spray Place 2 sprays into both nostrils daily.   No facility-administered encounter medications on file as of 07/11/2016.     No Known Allergies  Review of Systems  Constitutional: Positive for  activity change, appetite change and chills. Negative for fever.  HENT: Positive for congestion, ear pain, postnasal drip, rhinorrhea and sinus pressure. Negative for sore throat.   Eyes: Negative for redness and itching.  Respiratory: Positive for cough. Negative for chest tightness and shortness of breath.   Cardiovascular: Negative for chest pain, palpitations and leg swelling.  Gastrointestinal: Negative for abdominal pain, constipation and diarrhea.  Genitourinary: Negative for difficulty urinating and dysuria.  Musculoskeletal: Negative for arthralgias and back pain.  Neurological: Positive for headaches. Negative for light-headedness.  Hematological: Negative for adenopathy. Does not bruise/bleed easily.    BP (!) 138/96 (BP Location: Right Arm, Patient Position: Sitting, Cuff Size: Normal)   Pulse 64   Temp 97.8 F (36.6 C) (Oral)   Resp 18   Ht 5\' 6"  (1.676 m)   Wt 230 lb (104.3 kg)   LMP 09/25/2016 (Exact Date)   SpO2 100%   BMI 37.12 kg/m   Physical Exam  Constitutional: She is oriented to person, place, and time. She appears well-developed and well-nourished.  Obese. Pleasant. Appears uncomfortable from her cold symptoms.  HENT:  Head: Normocephalic and atraumatic.  Right Ear: External ear normal.  Left Ear: External ear normal.  Nasal membranes congested and red. thick drainage. Mild sinus tenderness. Posterior pharynx mildly injected. No exudate.  Eyes: Conjunctivae are normal. Pupils are equal, round, and reactive to light.  Neck: Normal range of motion. Neck supple. No thyromegaly  present.  Mildly tender cervical nodes  Cardiovascular: Normal rate, regular rhythm and normal heart sounds.   Pulmonary/Chest: Effort normal and breath sounds normal.  Abdominal: Soft. Bowel sounds are normal.  ports healed well  Neurological: She is alert and oriented to person, place, and time.  Psychiatric: She has a normal mood and affect. Her behavior is normal.    1. Viral  URI with cough.  Sinusitis. Continue flonase   2. Bariatric surgery status    Patient Instructions  Be sure to drink enough water  Take the amoxicillin 3 times a day  See the nutrition specialist as scheduled,  This is important.   Eustace MooreYvonne Sue , MD

## 2016-10-03 ENCOUNTER — Telehealth: Payer: Self-pay | Admitting: Family Medicine

## 2016-10-03 NOTE — Telephone Encounter (Signed)
Celine MansSonja is calling stating that she needs a refill on the Amoxicillin she states the pharmacy will be faxing over a request, please advise?

## 2016-10-06 NOTE — Telephone Encounter (Signed)
Unable to refill antibiotic.  Will await return call from patient.

## 2016-10-07 ENCOUNTER — Ambulatory Visit: Payer: Medicaid Other | Admitting: Orthopaedic Surgery

## 2016-10-09 ENCOUNTER — Encounter: Payer: Self-pay | Admitting: Orthopaedic Surgery

## 2016-10-09 ENCOUNTER — Ambulatory Visit (INDEPENDENT_AMBULATORY_CARE_PROVIDER_SITE_OTHER): Payer: Medicaid Other | Admitting: Orthopaedic Surgery

## 2016-10-09 ENCOUNTER — Ambulatory Visit: Payer: Medicaid Other | Admitting: Orthopaedic Surgery

## 2016-10-09 VITALS — BP 117/64 | HR 86 | Temp 98.1°F | Ht 67.0 in | Wt 224.0 lb

## 2016-10-09 DIAGNOSIS — S76911D Strain of unspecified muscles, fascia and tendons at thigh level, right thigh, subsequent encounter: Secondary | ICD-10-CM | POA: Diagnosis not present

## 2016-10-09 DIAGNOSIS — G8929 Other chronic pain: Secondary | ICD-10-CM | POA: Diagnosis not present

## 2016-10-09 DIAGNOSIS — M25561 Pain in right knee: Secondary | ICD-10-CM

## 2016-10-09 MED ORDER — HYDROCODONE-ACETAMINOPHEN 5-325 MG PO TABS: 1.0000 | ORAL_TABLET | ORAL | 0 refills | Status: DC | PRN

## 2016-10-09 NOTE — Progress Notes (Signed)
Patient ZO:XWRUE:Stacey Palmer, female DOB:08/11/1981, 35 y.o. AVW:[id number]  Chief Complaint  Patient presents with  . Follow-up    Rt hamstring    HPI  Stacey Palmer is a 35 y.o. female who has had hamstring strain on the right that is now chronic.  She has spasm at times.  She has no new trauma. She is off her crutches. The cooler weather has made her worse.  She has no redness or numbness. HPI  Body mass index is 35.08 kg/m.  ROS  Review of Systems  HENT: Negative for congestion.   Respiratory: Negative for cough and shortness of breath.   Cardiovascular: Negative for chest pain and leg swelling.  Endocrine: Positive for cold intolerance.  Musculoskeletal: Positive for arthralgias, gait problem and myalgias.  Allergic/Immunologic: Positive for environmental allergies.  Neurological: Negative for numbness.  Psychiatric/Behavioral: Positive for sleep disturbance. The patient is nervous/anxious.     Past Medical History:  Diagnosis Date  . Allergy   . Anxiety   . Obesity   . Sleep apnea     Past Surgical History:  Procedure Laterality Date  . CESAREAN SECTION    . TUBAL LIGATION      Family History  Problem Relation Age of Onset  . Hypertension Mother   . Kidney disease Father   . Diabetes Father     Social History Social History  Substance Use Topics  . Smoking status: Never Smoker  . Smokeless tobacco: Never Used  . Alcohol use No    No Known Allergies  Current Outpatient Prescriptions  Medication Sig Dispense Refill  . amoxicillin (AMOXIL) 500 MG capsule Take 1 capsule (500 mg total) by mouth 3 (three) times daily. 21 capsule 0  . azelastine (ASTELIN) 0.1 % nasal spray Place 2 sprays into both nostrils 2 (two) times daily. Use in each nostril as directed 30 mL 4  . cetirizine (ZYRTEC) 10 MG tablet Take 1 tablet (10 mg total) by mouth daily. 20 tablet 0  . clotrimazole-betamethasone (LOTRISONE) cream Apply twice daily to rash for 10 days, then , as needed 45  g 0  . cyclobenzaprine (FLEXERIL) 10 MG tablet Take 1 tablet (10 mg total) by mouth 2 (two) times daily as needed for muscle spasms. 20 tablet 0  . fluticasone (FLONASE) 50 MCG/ACT nasal spray Place 2 sprays into both nostrils daily. 16 g 0  . HYDROcodone-acetaminophen (NORCO/VICODIN) 5-325 MG tablet Take 1 tablet by mouth every 4 (four) hours as needed for moderate pain (Must last 14 days.Do not take and drive a car or use machinery.). 56 tablet 0  . montelukast (SINGULAIR) 10 MG tablet Take 1 tablet (10 mg total) by mouth at bedtime. 30 tablet 4  . venlafaxine (EFFEXOR) 37.5 MG tablet TAKE ONE TABLET BY MOUTH TWICE DAILY ---- 60 tablet 2  . Vitamin D, Ergocalciferol, (DRISDOL) 50000 units CAPS capsule Take 1 capsule (50,000 Units total) by mouth once a week. 4 capsule 4   No current facility-administered medications for this visit.      Physical Exam  Blood pressure 117/64, pulse 86, temperature 98.1 F (36.7 C), height 5\' 7"  (1.702 m), weight 224 lb (101.6 kg), last menstrual period 09/25/2016.  Constitutional: overall normal hygiene, normal nutrition, well developed, normal grooming, normal body habitus. Assistive device:none  Musculoskeletal: gait and station Limp right, muscle tone and strength are normal, no tremors or atrophy is present.  .  Neurological: coordination overall normal.  Deep tendon reflex/nerve stretch intact.  Sensation normal.  Cranial nerves II-XII intact.   Skin:   Normal overall no scars, lesions, ulcers or rashes. No psoriasis.  Psychiatric: Alert and oriented x 3.  Recent memory intact, remote memory unclear.  Normal mood and affect. Well groomed.  Good eye contact.  Cardiovascular: overall no swelling, no varicosities, no edema bilaterally, normal temperatures of the legs and arms, no clubbing, cyanosis and good capillary refill.  Lymphatic: palpation is normal.  The right medial hamstring is tight distally at the knee. She has no spasm. ROM of the  right knee is full. Gait is good.  NV intact.  The patient has been educated about the nature of the problem(s) and counseled on treatment options.  The patient appeared to understand what I have discussed and is in agreement with it.  Encounter Diagnoses  Name Primary?  . Muscle strain of thigh, right, subsequent encounter Yes  . Chronic pain of right knee     PLAN Call if any problems.  Precautions discussed.  Continue current medications.   Return to clinic 1 month   Electronically Signed Darreld McleanWayne , MD 12/7/201710:47 AM

## 2016-10-21 ENCOUNTER — Ambulatory Visit (INDEPENDENT_AMBULATORY_CARE_PROVIDER_SITE_OTHER): Payer: Medicaid Other | Admitting: Family Medicine

## 2016-10-21 DIAGNOSIS — R7301 Impaired fasting glucose: Secondary | ICD-10-CM

## 2016-10-21 DIAGNOSIS — E559 Vitamin D deficiency, unspecified: Secondary | ICD-10-CM | POA: Insufficient documentation

## 2016-11-03 DIAGNOSIS — Z903 Acquired absence of stomach [part of]: Secondary | ICD-10-CM

## 2016-11-03 HISTORY — DX: Acquired absence of stomach (part of): Z90.3

## 2016-11-03 HISTORY — PX: BARIATRIC SURGERY: SHX1103

## 2016-11-06 ENCOUNTER — Ambulatory Visit: Payer: Medicaid Other | Admitting: Orthopaedic Surgery

## 2016-11-10 ENCOUNTER — Telehealth: Payer: Self-pay

## 2016-11-10 MED ORDER — FLUCONAZOLE 150 MG PO TABS: 150.0000 mg | ORAL_TABLET | Freq: Once | ORAL | 0 refills | Status: AC

## 2016-11-10 NOTE — Telephone Encounter (Signed)
Standing order initiated for Diflucan due to yeast infection from ABT.    Medication sent to requested pharmacy.

## 2016-11-13 ENCOUNTER — Ambulatory Visit: Payer: Medicaid Other | Admitting: Orthopaedic Surgery

## 2016-11-18 ENCOUNTER — Encounter: Payer: Self-pay | Admitting: Orthopaedic Surgery

## 2016-11-18 ENCOUNTER — Ambulatory Visit: Payer: Medicaid Other | Admitting: Orthopaedic Surgery

## 2016-11-19 ENCOUNTER — Ambulatory Visit: Payer: Medicaid Other | Admitting: Family Medicine

## 2016-11-20 NOTE — Progress Notes (Signed)
Pt did not keep appt, no note, no charge

## 2016-11-25 ENCOUNTER — Ambulatory Visit (INDEPENDENT_AMBULATORY_CARE_PROVIDER_SITE_OTHER): Payer: Medicaid Other | Admitting: Orthopaedic Surgery

## 2016-11-25 VITALS — BP 131/89 | HR 55 | Temp 97.7°F | Ht 67.0 in | Wt 217.0 lb

## 2016-11-25 DIAGNOSIS — S76911D Strain of unspecified muscles, fascia and tendons at thigh level, right thigh, subsequent encounter: Secondary | ICD-10-CM | POA: Diagnosis not present

## 2016-11-25 DIAGNOSIS — M25561 Pain in right knee: Secondary | ICD-10-CM | POA: Diagnosis not present

## 2016-11-25 DIAGNOSIS — G8929 Other chronic pain: Secondary | ICD-10-CM

## 2016-11-25 NOTE — Progress Notes (Signed)
Patient VH:QIONG:Stacey Palmer, female DOB:12/06/1980, 36 y.o. EXB:[id number]  Chief Complaint  Patient presents with  . Follow-up    Right Thigh    HPI  Stacey Palmer is a 36 y.o. female who has chronic pain of the right medial thigh post hamstring injury.  She is much better and walking better. She has no new trauma. HPI  Body mass index is 33.99 kg/m.  ROS  Review of Systems  HENT: Negative for congestion.   Respiratory: Negative for cough and shortness of breath.   Cardiovascular: Negative for chest pain and leg swelling.  Endocrine: Positive for cold intolerance.  Musculoskeletal: Positive for arthralgias, gait problem and myalgias.  Allergic/Immunologic: Positive for environmental allergies.  Neurological: Negative for numbness.  Psychiatric/Behavioral: Positive for sleep disturbance. The patient is nervous/anxious.     Past Medical History:  Diagnosis Date  . Allergy   . Anxiety   . Obesity   . Sleep apnea     Past Surgical History:  Procedure Laterality Date  . CESAREAN SECTION    . TUBAL LIGATION      Family History  Problem Relation Age of Onset  . Hypertension Mother   . Kidney disease Father   . Diabetes Father     Social History Social History  Substance Use Topics  . Smoking status: Never Smoker  . Smokeless tobacco: Never Used  . Alcohol use No    No Known Allergies  Current Outpatient Prescriptions  Medication Sig Dispense Refill  . amoxicillin (AMOXIL) 500 MG capsule Take 1 capsule (500 mg total) by mouth 3 (three) times daily. 21 capsule 0  . azelastine (ASTELIN) 0.1 % nasal spray Place 2 sprays into both nostrils 2 (two) times daily. Use in each nostril as directed 30 mL 4  . cetirizine (ZYRTEC) 10 MG tablet Take 1 tablet (10 mg total) by mouth daily. 20 tablet 0  . clotrimazole-betamethasone (LOTRISONE) cream Apply twice daily to rash for 10 days, then , as needed 45 g 0  . cyclobenzaprine (FLEXERIL) 10 MG tablet Take 1 tablet (10 mg total)  by mouth 2 (two) times daily as needed for muscle spasms. 20 tablet 0  . fluticasone (FLONASE) 50 MCG/ACT nasal spray Place 2 sprays into both nostrils daily. 16 g 0  . HYDROcodone-acetaminophen (NORCO/VICODIN) 5-325 MG tablet Take 1 tablet by mouth every 4 (four) hours as needed for moderate pain (Must last 14 days.Do not take and drive a car or use machinery.). 56 tablet 0  . montelukast (SINGULAIR) 10 MG tablet Take 1 tablet (10 mg total) by mouth at bedtime. 30 tablet 4  . venlafaxine (EFFEXOR) 37.5 MG tablet TAKE ONE TABLET BY MOUTH TWICE DAILY ---- 60 tablet 2  . Vitamin D, Ergocalciferol, (DRISDOL) 50000 units CAPS capsule Take 1 capsule (50,000 Units total) by mouth once a week. 4 capsule 4   No current facility-administered medications for this visit.      Physical Exam  Blood pressure 131/89, pulse (!) 55, temperature 97.7 F (36.5 C), height 5\' 7"  (1.702 m), weight 217 lb (98.4 kg).  Constitutional: overall normal hygiene, normal nutrition, well developed, normal grooming, normal body habitus. Assistive device:none  Musculoskeletal: gait and station Limp none, muscle tone and strength are normal, no tremors or atrophy is present.  .  Neurological: coordination overall normal.  Deep tendon reflex/nerve stretch intact.  Sensation normal.  Cranial nerves II-XII intact.   Skin:   Normal overall no scars, lesions, ulcers or rashes. No psoriasis.  Psychiatric:  Alert and oriented x 3.  Recent memory intact, remote memory unclear.  Normal mood and affect. Well groomed.  Good eye contact.  Cardiovascular: overall no swelling, no varicosities, no edema bilaterally, normal temperatures of the legs and arms, no clubbing, cyanosis and good capillary refill.  Lymphatic: palpation is normal.  She has slight pain of the right medial hamstring but has full motion and is walking well today.  NV is intact.   The patient has been educated about the nature of the problem(s) and counseled on  treatment options.  The patient appeared to understand what I have discussed and is in agreement with it.  Encounter Diagnoses  Name Primary?  . Muscle strain of thigh, right, subsequent encounter Yes  . Chronic pain of right knee     PLAN Call if any problems.  Precautions discussed.  Continue current medications.   Return to clinic PRN   Electronically Signed Darreld Mclean, MD 1/23/201810:04 AM

## 2016-12-17 ENCOUNTER — Telehealth: Payer: Self-pay

## 2016-12-17 MED ORDER — OSELTAMIVIR PHOSPHATE 75 MG PO CAPS: 75.0000 mg | ORAL_CAPSULE | Freq: Two times a day (BID) | ORAL | 0 refills | Status: DC

## 2016-12-17 NOTE — Telephone Encounter (Signed)
Acute onset of fever Yes.   Headache Yes.   Body ache Yes.   Fatigue Yes.   Non productive cough No. Sore throat No. Nasal discharge No.  Onset between 1-4 days of possible exposure Yes.    Recommendation:  Fluid, rest, Tylenol for control of fever  Expect 5 days before feeling better and not so contagious and generally better in 7-10 days.  Standard treatment Tama flu 75 mg 1 tablet twice daily times 5 days  Please call office if symptoms do not improve or worsen in 5 days after beginning treatment.

## 2016-12-25 ENCOUNTER — Encounter (HOSPITAL_COMMUNITY): Payer: Self-pay | Admitting: Emergency Medicine

## 2016-12-25 ENCOUNTER — Emergency Department (HOSPITAL_COMMUNITY)
Admission: EM | Admit: 2016-12-25 | Discharge: 2016-12-25 | Disposition: A | Discharge status: Home / Self Care | Payer: Medicaid Other | Attending: Emergency Medicine | Admitting: Emergency Medicine

## 2016-12-25 ENCOUNTER — Emergency Department (HOSPITAL_COMMUNITY): Payer: Medicaid Other

## 2016-12-25 DIAGNOSIS — S161XXA Strain of muscle, fascia and tendon at neck level, initial encounter: Secondary | ICD-10-CM | POA: Diagnosis not present

## 2016-12-25 DIAGNOSIS — Y999 Unspecified external cause status: Secondary | ICD-10-CM | POA: Insufficient documentation

## 2016-12-25 DIAGNOSIS — S199XXA Unspecified injury of neck, initial encounter: Secondary | ICD-10-CM | POA: Diagnosis present

## 2016-12-25 DIAGNOSIS — Y92410 Unspecified street and highway as the place of occurrence of the external cause: Secondary | ICD-10-CM | POA: Diagnosis not present

## 2016-12-25 DIAGNOSIS — Y9389 Activity, other specified: Secondary | ICD-10-CM | POA: Diagnosis not present

## 2016-12-25 MED ORDER — ACETAMINOPHEN 500 MG PO TABS: 500.0000 mg | ORAL_TABLET | Freq: Four times a day (QID) | ORAL | 0 refills | Status: DC | PRN

## 2016-12-25 MED ORDER — METHOCARBAMOL 500 MG PO TABS: 1000.0000 mg | ORAL_TABLET | Freq: Four times a day (QID) | ORAL | 0 refills | Status: AC
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
## 2016-12-30 ENCOUNTER — Encounter: Payer: Self-pay | Admitting: Family Medicine

## 2016-12-30 ENCOUNTER — Ambulatory Visit (INDEPENDENT_AMBULATORY_CARE_PROVIDER_SITE_OTHER): Payer: Medicaid Other | Admitting: Family Medicine

## 2016-12-30 VITALS — BP 124/78 | HR 75 | Resp 15 | Ht 67.0 in | Wt 209.0 lb

## 2016-12-30 DIAGNOSIS — M62838 Other muscle spasm: Secondary | ICD-10-CM | POA: Insufficient documentation

## 2016-12-30 DIAGNOSIS — F329 Major depressive disorder, single episode, unspecified: Secondary | ICD-10-CM | POA: Diagnosis not present
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
## 2016-12-30 NOTE — Assessment & Plan Note (Addendum)
Needs therapy. Repeated unhealthy relationships , will refer to therapy and she is resuming effexor, not suicidal or homicidal
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
## 2016-12-30 NOTE — Assessment & Plan Note (Signed)
>>  ASSESSMENT AND PLAN FOR DEPRESSION WRITTEN ON 01/04/2017  6:49 AM BY SIMPSON, Milus Mallick, MD  Needs therapy. Repeated unhealthy relationships , will refer to therapy and she is resuming effexor, not suicidal or homicidal

## 2016-12-30 NOTE — Assessment & Plan Note (Signed)
Improved. Pt applauded on succesful weight loss through lifestyle change, and encouraged to continue same. Weight loss goal set for the next several months.  

## 2017-01-20 ENCOUNTER — Encounter: Payer: Self-pay | Admitting: Orthopaedic Surgery

## 2017-01-20 ENCOUNTER — Ambulatory Visit (INDEPENDENT_AMBULATORY_CARE_PROVIDER_SITE_OTHER): Payer: Medicaid Other | Admitting: Orthopaedic Surgery

## 2017-01-20 ENCOUNTER — Ambulatory Visit (INDEPENDENT_AMBULATORY_CARE_PROVIDER_SITE_OTHER): Payer: Medicaid Other

## 2017-01-20 VITALS — BP 115/73 | HR 65 | Temp 97.7°F | Ht 67.0 in | Wt 209.0 lb

## 2017-01-20 DIAGNOSIS — M542 Cervicalgia: Secondary | ICD-10-CM

## 2017-01-20 DIAGNOSIS — M25511 Pain in right shoulder: Secondary | ICD-10-CM | POA: Diagnosis not present

## 2017-01-20 MED ORDER — HYDROCODONE-ACETAMINOPHEN 5-325 MG PO TABS: ORAL_TABLET | ORAL | 0 refills | Status: DC

## 2017-01-20 MED ORDER — CYCLOBENZAPRINE HCL 10 MG PO TABS: 10.0000 mg | ORAL_TABLET | Freq: Every day | ORAL | 0 refills | Status: DC
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
## 2017-01-20 NOTE — Patient Instructions (Signed)
Cervical Strain and Sprain Rehab Ask your health care provider which exercises are safe for you. Do exercises exactly as told by your health care provider and adjust them as directed. It is normal to feel mild stretching, pulling, tightness, or discomfort as you do these exercises, but you should stop right away if you feel sudden pain or your pain gets worse.Do not begin these exercises until told by your health care provider. Stretching and range of motion exercises These exercises warm up your muscles and joints and improve the movement and flexibility of your neck. These exercises also help to relieve pain, numbness, and tingling. Exercise A: Cervical side bend 1. Using good posture, sit on a stable chair or stand up. 2. Without moving your shoulders, slowly tilt your left / right ear to your shoulder until you feel a stretch in your neck muscles. You should be looking straight ahead. 3. Hold for __________ seconds. 4. Repeat with the other side of your neck. Repeat __________ times. Complete this exercise __________ times a day. Exercise B: Cervical rotation 1. Using good posture, sit on a stable chair or stand up. 2. Slowly turn your head to the side as if you are looking over your left / right shoulder.  Keep your eyes level with the ground.  Stop when you feel a stretch along the side and the back of your neck. 3. Hold for __________ seconds. 4. Repeat this by turning to your other side. Repeat __________ times. Complete this exercise __________ times a day. Exercise C: Thoracic extension and pectoral stretch 1. Roll a towel or a small blanket so it is about 4 inches (10 cm) in diameter. 2. Lie down on your back on a firm surface. 3. Put the towel lengthwise, under your spine in the middle of your back. It should not be not under your shoulder blades. The towel should line up with your spine from your middle back to your lower back. 4. Put your hands behind your head and let your  elbows fall out to your sides. 5. Hold for __________ seconds. Repeat __________ times. Complete this exercise __________ times a day. Strengthening exercises These exercises build strength and endurance in your neck. Endurance is the ability to use your muscles for a long time, even after your muscles get tired. Exercise D: Upper cervical flexion, isometric 1. Lie on your back with a thin pillow behind your head and a small rolled-up towel under your neck. 2. Gently tuck your chin toward your chest and nod your head down to look toward your feet. Do not lift your head off the pillow. 3. Hold for __________ seconds. 4. Release the tension slowly. Relax your neck muscles completely before you repeat this exercise. Repeat __________ times. Complete this exercise __________ times a day. Exercise E: Cervical extension, isometric 1. Stand about 6 inches (15 cm) away from a wall, with your back facing the wall. 2. Place a soft object, about 6-8 inches (15-20 cm) in diameter, between the back of your head and the wall. A soft object could be a small pillow, a ball, or a folded towel. 3. Gently tilt your head back and press into the soft object. Keep your jaw and forehead relaxed. 4. Hold for __________ seconds. 5. Release the tension slowly. Relax your neck muscles completely before you repeat this exercise. Repeat __________ times. Complete this exercise __________ times a day. Posture and body mechanics   Body mechanics refers to the movements and positions of your body   while you do your daily activities. Posture is part of body mechanics. Good posture and healthy body mechanics can help to relieve stress in your body's tissues and joints. Good posture means that your spine is in its natural S-curve position (your spine is neutral), your shoulders are pulled back slightly, and your head is not tipped forward. The following are general guidelines for applying improved posture and body mechanics to your  everyday activities. Standing  When standing, keep your spine neutral and keep your feet about hip-width apart. Keep a slight bend in your knees. Your ears, shoulders, and hips should line up.  When you do a task in which you stand in one place for a long time, place one foot up on a stable object that is 2-4 inches (5-10 cm) high, such as a footstool. This helps keep your spine neutral. Sitting  When sitting, keep your spine neutral and your keep feet flat on the floor. Use a footrest, if necessary, and keep your thighs parallel to the floor. Avoid rounding your shoulders, and avoid tilting your head forward.  When working at a desk or a computer, keep your desk at a height where your hands are slightly lower than your elbows. Slide your chair under your desk so you are close enough to maintain good posture.  When working at a computer, place your monitor at a height where you are looking straight ahead and you do not have to tilt your head forward or downward to look at the screen. Resting When lying down and resting, avoid positions that are most painful for you. Try to support your neck in a neutral position. You can use a contour pillow or a small rolled-up towel. Your pillow should support your neck but not push on it. This information is not intended to replace advice given to you by your health care provider. Make sure you discuss any questions you have with your health care provider. Document Released: 10/20/2005 Document Revised: 06/26/2016 Document Reviewed: 09/26/2015 Elsevier Interactive Patient Education  2017 [employer]vier Inc.  

## 2017-01-21 ENCOUNTER — Telehealth: Payer: Self-pay | Admitting: Orthopaedic Surgery

## 2017-01-21 NOTE — Telephone Encounter (Signed)
Patient called regarding work status; states that even with work restriction of no heavy lifting, still having difficulty at work with neck pain. She is requesting a note to be out of work for a few days.  Please advise.  Her phone # is [phone]

## 2017-01-22 ENCOUNTER — Encounter: Payer: Self-pay | Admitting: Orthopaedic Surgery

## 2017-01-22 NOTE — Telephone Encounter (Signed)
OK  Give her note 

## 2017-01-22 NOTE — Telephone Encounter (Signed)
Note issued accordingly. 
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
## 2017-01-27 ENCOUNTER — Ambulatory Visit (INDEPENDENT_AMBULATORY_CARE_PROVIDER_SITE_OTHER): Payer: Medicaid Other | Admitting: Orthopaedic Surgery

## 2017-01-27 ENCOUNTER — Ambulatory Visit (INDEPENDENT_AMBULATORY_CARE_PROVIDER_SITE_OTHER): Payer: Medicaid Other | Admitting: Psychiatry

## 2017-01-27 ENCOUNTER — Encounter (HOSPITAL_COMMUNITY): Payer: Self-pay | Admitting: Psychiatry

## 2017-01-27 ENCOUNTER — Encounter: Payer: Self-pay | Admitting: Orthopaedic Surgery

## 2017-01-27 VITALS — BP 115/76 | HR 51 | Ht 67.0 in | Wt 209.4 lb

## 2017-01-27 VITALS — BP 114/71 | HR 63 | Temp 97.7°F | Ht 67.0 in | Wt 209.0 lb

## 2017-01-27 DIAGNOSIS — M25511 Pain in right shoulder: Secondary | ICD-10-CM

## 2017-01-27 DIAGNOSIS — M542 Cervicalgia: Secondary | ICD-10-CM

## 2017-01-27 DIAGNOSIS — F4321 Adjustment disorder with depressed mood: Secondary | ICD-10-CM | POA: Diagnosis not present

## 2017-01-27 DIAGNOSIS — Z79899 Other long term (current) drug therapy: Secondary | ICD-10-CM | POA: Diagnosis not present

## 2017-01-27 NOTE — Addendum Note (Signed)
Addended by: Gevena Cotton on: 01/27/2017 04:24 PM   Modules accepted: Orders

## 2017-01-27 NOTE — Progress Notes (Signed)
Patient ZO:XWRUE Stacey Palmer, female DOB:06-Oct-1981, 36 y.o. AVW:[id number]  Chief Complaint  Patient presents with  . Follow-up    Neck and shoulder pain    HPI  Stacey Palmer is a 36 y.o. female who has continued pain of the neck and right shoulder.  I will have her go to PT once and learn the exercises to do.  I will keep her out of work.  She has improved somewhat but not enough to go back to work.  She has no new trauma. HPI  Body mass index is 32.73 kg/m.  ROS  Review of Systems  HENT: Negative for congestion.   Respiratory: Negative for cough and shortness of breath.   Cardiovascular: Negative for chest pain and leg swelling.  Endocrine: Positive for cold intolerance.  Musculoskeletal: Positive for arthralgias, gait problem and myalgias.  Allergic/Immunologic: Positive for environmental allergies.  Neurological: Negative for numbness.  Psychiatric/Behavioral: Positive for sleep disturbance. The patient is nervous/anxious.     Past Medical History:  Diagnosis Date  . Allergy   . Anxiety   . Obesity   . Sleep apnea     Past Surgical History:  Procedure Laterality Date  . CESAREAN SECTION    . TUBAL LIGATION      Family History  Problem Relation Age of Onset  . Hypertension Mother   . Kidney disease Father   . Diabetes Father     Social History Social History  Substance Use Topics  . Smoking status: Never Smoker  . Smokeless tobacco: Never Used  . Alcohol use No     Comment: 01-27-2017 per pt occa. Wine    Allergies  Allergen Reactions  . Shellfish Allergy Hives    Reports only once (she did not have any episode afterwards)    Current Outpatient Prescriptions  Medication Sig Dispense Refill  . acetaminophen (TYLENOL) 500 MG tablet Take 1 tablet (500 mg total) by mouth every 6 (six) hours as needed. 30 tablet 0  . azelastine (ASTELIN) 0.1 % nasal spray Place 2 sprays into both nostrils 2 (two) times daily. Use in each nostril as directed 30 mL 4  .  calcium carbonate (TUMS - DOSED IN MG ELEMENTAL CALCIUM) 500 MG chewable tablet Chew 500 mg by mouth daily.    . cetirizine (ZYRTEC) 10 MG tablet Take 1 tablet (10 mg total) by mouth daily. 20 tablet 0  . clotrimazole-betamethasone (LOTRISONE) cream Apply twice daily to rash for 10 days, then , as needed 45 g 0  . cyclobenzaprine (FLEXERIL) 10 MG tablet Take 1 tablet (10 mg total) by mouth at bedtime. 20 tablet 0  . fluticasone (FLONASE) 50 MCG/ACT nasal spray Place 2 sprays into both nostrils daily. 16 g 0  . HYDROcodone-acetaminophen (NORCO/VICODIN) 5-325 MG tablet One tablet by mouth every six hours as needed for pain.  Seven day limit per Medicaid guidelines. 28 tablet 0  . montelukast (SINGULAIR) 10 MG tablet Take 1 tablet (10 mg total) by mouth at bedtime. 30 tablet 4  . omeprazole (PRILOSEC) 20 MG capsule Take 20 mg by mouth daily.  6  . Vitamin D, Ergocalciferol, (DRISDOL) 50000 units CAPS capsule Take 1 capsule (50,000 Units total) by mouth once a week. 4 capsule 4   No current facility-administered medications for this visit.      Physical Exam  Blood pressure 114/71, pulse 63, temperature 97.7 F (36.5 C), height 5\' 7"  (1.702 m), weight 209 lb (94.8 kg), last menstrual period 12/31/2016.  Constitutional: overall normal  hygiene, normal nutrition, well developed, normal grooming, normal body habitus. Assistive device:none  Musculoskeletal: gait and station Limp none, muscle tone and strength are normal, no tremors or atrophy is present.  .  Neurological: coordination overall normal.  Deep tendon reflex/nerve stretch intact.  Sensation normal.  Cranial nerves II-XII intact.   Skin:   Normal overall no scars, lesions, ulcers or rashes. No psoriasis.  Psychiatric: Alert and oriented x 3.  Recent memory intact, remote memory unclear.  Normal mood and affect. Well groomed.  Good eye contact.  Cardiovascular: overall no swelling, no varicosities, no edema bilaterally, normal  temperatures of the legs and arms, no clubbing, cyanosis and good capillary refill.  Lymphatic: palpation is normal.  She has right sided neck pain with no spasm.  ROM is full but very tender. She has no paresthesias.  The ROM of the right shoulder is full today but painful in the extremes.  Grips are normal.  NV intact.  The patient has been educated about the nature of the problem(s) and counseled on treatment options.  The patient appeared to understand what I have discussed and is in agreement with it.  Encounter Diagnoses  Name Primary?  . Pain in joint of right shoulder Yes  . Neck pain     PLAN Call if any problems.  Precautions discussed.  Continue current medications.   Return to clinic 2 weeks  Stay out of work.  Go to PT.  Electronically Signed Darreld McleanWayne , MD 3/27/20183:10 PM

## 2017-01-27 NOTE — Patient Instructions (Addendum)
1. No need for medication at this time 2. Referral to therapy with Ms. Peggy Bynum 3. Return to clinic for medication management as needed

## 2017-02-02 ENCOUNTER — Ambulatory Visit (HOSPITAL_COMMUNITY): Payer: Medicaid Other | Attending: Orthopaedic Surgery

## 2017-02-02 ENCOUNTER — Encounter (HOSPITAL_COMMUNITY): Payer: Self-pay

## 2017-02-02 DIAGNOSIS — R29898 Other symptoms and signs involving the musculoskeletal system: Secondary | ICD-10-CM

## 2017-02-02 DIAGNOSIS — M25511 Pain in right shoulder: Secondary | ICD-10-CM | POA: Diagnosis present

## 2017-02-02 DIAGNOSIS — M25611 Stiffness of right shoulder, not elsewhere classified: Secondary | ICD-10-CM | POA: Diagnosis present

## 2017-02-02 DIAGNOSIS — M542 Cervicalgia: Secondary | ICD-10-CM

## 2017-02-02 NOTE — Patient Instructions (Signed)
*  Use heat on your shoulder and neck for 10-15 minutes and remove for 10 minutes. You may repeat as often as needed.    Self Myofascial Release (SMFR) of Mid-back  Position a ball between your shoulder blade and spine. Lean into the ball (to tolerance). Hold the pressure with your muscles relaxed until pain subsides. Then you can make slow and small side-to-side or up-and-down movements, or slowly bring your arm across your body.      Cervical Self STM  Use massage stick or rolling pin to perform self massage of your neck muscles.  Can perform sitting or standing. Find areas that are tender and gently massage them for a couple minutes at a time when possible throughout the day.       Flexibility: Upper Trapezius Stretch   Gently grasp right side of head while reaching behind back with other hand. Tilt head away until a gentle stretch is felt. Hold _15-30__ seconds. Repeat _2___ times per set. Do ____ sets per session. Do ____ sessions per day.  [email or web address]://[email or web address].[email or web address]   Levator Stretch   Grasp seat or sit on hand on side to be stretched. Turn head toward other side and look down. Use hand on head to gently stretch neck in that position. Hold __15-30__ seconds. Repeat on other side. Repeat _2___ times. Do ____ sessions per day.  [email or web address]   Scapular Retraction (Standing)   With arms at sides, pinch shoulder blades together. Repeat __10__ times per set. Do _1___ sets per session. Do ____ sessions per day.  http://[email or web address].us/944   Flexibility: Neck Retraction   Pull head straight back, keeping eyes and jaw level. Repeat __10__ times per set. Do __1__ sets per session. Do ____ sessions per day.  [email or web address]://[email or web address].us/344   Posture - Sitting   Sit upright, head facing forward. Try using a roll to support lower back. Keep shoulders relaxed, and avoid rounded back. Keep hips level with knees. Avoid crossing legs for long periods.   Doorway Stretch  Place each  hand opposite each other on the doorway. (You can change where you feel the stretch by moving arms higher or lower.) Step through with one foot and bend front knee until a stretch is felt and hold. Step through with the opposite foot on the next rep. Hold for _____ seconds. Repeat ____times.        Posterior Capsule Stretch   Stand or sit, one arm across body so hand rests over opposite shoulder. Gently push on crossed elbow with other hand until stretch is felt in shoulder of crossed arm. Hold _15-30__ seconds.  Repeat _2__ times per session. Do ___ sessions per day.   Wall Flexion  Using a towel, slide your arm up the wall until a stretch is felt in your shoulder. Hold for 10-15 seconds. Complete 2 times.
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
## 2017-02-03 ENCOUNTER — Ambulatory Visit: Payer: Medicaid Other | Admitting: Orthopaedic Surgery

## 2017-02-03 ENCOUNTER — Ambulatory Visit (HOSPITAL_COMMUNITY): Payer: Self-pay | Admitting: Psychiatry

## 2017-02-19 ENCOUNTER — Ambulatory Visit (INDEPENDENT_AMBULATORY_CARE_PROVIDER_SITE_OTHER): Payer: Medicaid Other | Admitting: Psychiatry

## 2017-02-19 ENCOUNTER — Encounter (HOSPITAL_COMMUNITY): Payer: Self-pay | Admitting: Psychiatry

## 2017-02-19 DIAGNOSIS — F4321 Adjustment disorder with depressed mood: Secondary | ICD-10-CM
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
## 2017-03-09 ENCOUNTER — Telehealth (HOSPITAL_COMMUNITY): Payer: Self-pay | Admitting: *Deleted

## 2017-03-09 NOTE — Telephone Encounter (Signed)
left voice message, provider not available 03/10/17.

## 2017-03-10 ENCOUNTER — Ambulatory Visit (HOSPITAL_COMMUNITY): Payer: Self-pay | Admitting: Psychiatry

## 2017-03-18 ENCOUNTER — Ambulatory Visit (HOSPITAL_COMMUNITY): Payer: Self-pay | Admitting: Psychiatry

## 2017-03-26 ENCOUNTER — Ambulatory Visit (INDEPENDENT_AMBULATORY_CARE_PROVIDER_SITE_OTHER): Payer: Medicaid Other | Admitting: Psychiatry

## 2017-03-26 ENCOUNTER — Encounter (HOSPITAL_COMMUNITY): Payer: Self-pay | Admitting: Psychiatry

## 2017-03-26 DIAGNOSIS — F4321 Adjustment disorder with depressed mood: Secondary | ICD-10-CM | POA: Diagnosis not present
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
## 2017-04-01 ENCOUNTER — Ambulatory Visit (HOSPITAL_COMMUNITY): Payer: Self-pay | Admitting: Psychiatry

## 2017-04-01 ENCOUNTER — Telehealth (HOSPITAL_COMMUNITY): Payer: Self-pay | Admitting: *Deleted

## 2017-04-01 NOTE — Telephone Encounter (Signed)
left voice message, provider out of office today 04/01/17.

## 2017-04-13 ENCOUNTER — Telehealth (HOSPITAL_COMMUNITY): Payer: Self-pay | Admitting: *Deleted

## 2017-04-13 NOTE — Telephone Encounter (Signed)
returned phone call to patient regarding an appointment no answer, left voice message. 

## 2017-05-05 ENCOUNTER — Other Ambulatory Visit: Payer: Self-pay | Admitting: Family Medicine

## 2017-05-18 ENCOUNTER — Ambulatory Visit (INDEPENDENT_AMBULATORY_CARE_PROVIDER_SITE_OTHER): Payer: Medicaid Other | Admitting: Psychiatry

## 2017-05-18 ENCOUNTER — Encounter (HOSPITAL_COMMUNITY): Payer: Self-pay | Admitting: Psychiatry

## 2017-05-18 DIAGNOSIS — F4321 Adjustment disorder with depressed mood: Secondary | ICD-10-CM | POA: Diagnosis not present
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
## 2017-06-04 ENCOUNTER — Ambulatory Visit (INDEPENDENT_AMBULATORY_CARE_PROVIDER_SITE_OTHER): Payer: Medicaid Other | Admitting: Family Medicine

## 2017-06-04 ENCOUNTER — Encounter: Payer: Self-pay | Admitting: Family Medicine

## 2017-06-04 ENCOUNTER — Telehealth: Payer: Self-pay | Admitting: *Deleted

## 2017-06-04 VITALS — BP 118/78 | HR 91 | Temp 99.0°F | Resp 16 | Ht 67.0 in | Wt 192.0 lb

## 2017-06-04 DIAGNOSIS — K219 Gastro-esophageal reflux disease without esophagitis: Secondary | ICD-10-CM | POA: Diagnosis not present

## 2017-06-04 DIAGNOSIS — J0100 Acute maxillary sinusitis, unspecified: Secondary | ICD-10-CM | POA: Diagnosis not present

## 2017-06-04 DIAGNOSIS — E010 Iodine-deficiency related diffuse (endemic) goiter: Secondary | ICD-10-CM | POA: Diagnosis not present

## 2017-06-04 DIAGNOSIS — B009 Herpesviral infection, unspecified: Secondary | ICD-10-CM

## 2017-06-04 DIAGNOSIS — R7301 Impaired fasting glucose: Secondary | ICD-10-CM

## 2017-06-04 DIAGNOSIS — E669 Obesity, unspecified: Secondary | ICD-10-CM

## 2017-06-04 DIAGNOSIS — J329 Chronic sinusitis, unspecified: Secondary | ICD-10-CM | POA: Insufficient documentation

## 2017-06-04 DIAGNOSIS — E559 Vitamin D deficiency, unspecified: Secondary | ICD-10-CM

## 2017-06-04 LAB — TSH: TSH: 1.13 mIU/L

## 2017-06-04 LAB — CBC
HCT: 40.4 % (ref 35.0–45.0)
Hemoglobin: 13.7 g/dL (ref 11.7–15.5)
MCH: 31.8 pg (ref 27.0–33.0)
MCHC: 33.9 g/dL (ref 32.0–36.0)
MCV: 93.7 fL (ref 80.0–100.0)
MPV: 9.3 fL (ref 7.5–12.5)
Platelets: 335 10*3/uL (ref 140–400)
RBC: 4.31 MIL/uL (ref 3.80–5.10)
RDW: 12.5 % (ref 11.0–15.0)
WBC: 4.3 10*3/uL (ref 3.8–10.8)

## 2017-06-04 LAB — LIPID PANEL
CHOL/HDL RATIO: 2.4 ratio (ref <5.0)
CHOLESTEROL: 184 mg/dL (ref <200)
HDL: 77 mg/dL (ref >50)
LDL Cholesterol: 91 mg/dL (ref <100)
TRIGLYCERIDES: 82 mg/dL (ref <150)
VLDL: 16 mg/dL (ref <30)

## 2017-06-04 LAB — BASIC METABOLIC PANEL
BUN: 8 mg/dL (ref 7–25)
CALCIUM: 9.4 mg/dL (ref 8.6–10.2)
CHLORIDE: 102 mmol/L (ref 98–110)
CO2: 26 mmol/L (ref 20–31)
Creat: 0.67 mg/dL (ref 0.50–1.10)
GLUCOSE: 88 mg/dL (ref 65–99)
Potassium: 4 mmol/L (ref 3.5–5.3)
SODIUM: 136 mmol/L (ref 135–146)

## 2017-06-04 MED ORDER — AZITHROMYCIN 250 MG PO TABS: ORAL_TABLET | ORAL | 0 refills | Status: DC

## 2017-06-04 MED ORDER — ACYCLOVIR 400 MG PO TABS: 400.0000 mg | ORAL_TABLET | Freq: Three times a day (TID) | ORAL | 0 refills | Status: DC

## 2017-06-04 MED ORDER — FLUCONAZOLE 150 MG PO TABS: 150.0000 mg | ORAL_TABLET | Freq: Once | ORAL | 0 refills | Status: AC

## 2017-06-04 MED ORDER — PANTOPRAZOLE SODIUM 20 MG PO TBEC: 20.0000 mg | DELAYED_RELEASE_TABLET | Freq: Every day | ORAL | 1 refills | Status: DC

## 2017-06-04 NOTE — Assessment & Plan Note (Signed)
Current flare, treat with pulse of antiviral therapy

## 2017-06-04 NOTE — Assessment & Plan Note (Signed)
Enlarged thyroid gland with tender right anterior cervical adenitis, nneeds US of glansd

## 2017-06-04 NOTE — Patient Instructions (Signed)
CPE as before,    Work excuse from 7/31 to return 06/08/2017  You are treated for sinus infection and also for flare up of viral genital infection  You have medication, protonix for reflux  Labs today please  congrats on weight loss  You are referred for US of neck

## 2017-06-04 NOTE — Progress Notes (Signed)
   Rossie MuskratSonja L Dosher     MRN: [id number]      DOB: Aug 22, 1981   HPI Ms. Stacey SitesDash is here with a 5 day h/o right sinus pressure, yellow nasal drainage ,, yellow post nasal drainage, chills, burning in throat Has not worked past 2 days and feels weak does not want to return before next Monday C/o burning in throat and heartburn symptoms Flare of genital herpes with stinging and burning in past 3 days denies discharge Doing well with weight loss surgery, has lost 100 pounds in 1 year  ROS  Denies chest congestion, productive cough or wheezing. Denies chest pains, palpitations and leg swelling Denies abdominal pain, nausea, vomiting,diarrhea or constipation.   Denies dysuria, frequency, hesitancy or incontinence. Denies joint pain, swelling and limitation in mobility. Denies headaches, seizures, numbness, or tingling. Denies depression, anxiety or insomnia. Denies skin break down or rash.   PE  BP 118/78   Pulse 91   Temp 99 F (37.2 C) (Oral)   Resp 16   Ht 5\' 7"  (1.702 m)   Wt 192 lb (87.1 kg)   SpO2 96%   BMI 30.07 kg/m   Patient alert and oriented and in no cardiopulmonary distress.Ill appearing  HEENT: Mild  facial asymmetry, EOMI,   oropharynx pink and moist.  Neck thyromegaly, right maxillary sinus tenderness, right cervical adenitis, TM clear, thyromegaly, oropharynx no erythema or exudate, deviation of uvula to right  Chest: Clear to auscultation bilaterally.  CVS: S1, S2 no murmurs, no S3.Regular rate.  ABD: Soft mild epigastric tenderness, no guarding or rebound tenderness   Ext: No edema  MS: Adequate ROM spine, shoulders, hips and knees.  Skin: Intact, no ulcerations or rash noted.  Psych: Good eye contact, normal affect. Memory intact not anxious or depressed appearing.  CNS: CN 2-12 intact, power,  normal throughout.no focal deficits noted.   Assessment & Plan  Sinusitis 5 day h/o right sinus pressure with chills and drainage, z pack and work excuse, saline  nasal flushes  Thyromegaly Enlarged thyroid gland with tender right anterior cervical adenitis, nneeds US of glansd  Western blot positive HSV2 Current flare, treat with pulse of antiviral therapy  Obesity (BMI 30.0-34.9) Improved. Pt applauded on succesful weight loss through lifestyle change, and encouraged to continue same. Weight loss goal set for the next several months.   GERD (gastroesophageal reflux disease) Symptomatic, short course of PPI, discontinue caffeine

## 2017-06-04 NOTE — Telephone Encounter (Signed)
Patient called left message about her ultrasound Dr Lodema HongSimpson wants her to do, patient wants to know when this will be scheduled. Please advise

## 2017-06-04 NOTE — Assessment & Plan Note (Signed)
Symptomatic, short course of PPI, discontinue caffeine

## 2017-06-04 NOTE — Assessment & Plan Note (Signed)
Improved. Pt applauded on succesful weight loss through lifestyle change, and encouraged to continue same. Weight loss goal set for the next several months.  

## 2017-06-04 NOTE — Assessment & Plan Note (Addendum)
5 day h/o right sinus pressure with chills and drainage, z pack and work excuse, saline nasal flushes

## 2017-06-05 ENCOUNTER — Ambulatory Visit (HOSPITAL_COMMUNITY): Payer: Medicaid Other | Admitting: Psychiatry

## 2017-06-05 LAB — HEMOGLOBIN A1C
Hgb A1c MFr Bld: 4.9 % (ref <5.7)
Mean Plasma Glucose: 94 mg/dL

## 2017-06-05 LAB — VITAMIN D 25 HYDROXY (VIT D DEFICIENCY, FRACTURES): VIT D 25 HYDROXY: 40 ng/mL (ref 30–100)

## 2017-06-05 NOTE — Telephone Encounter (Signed)
Message left on voicemail with date/time

## 2017-06-10 ENCOUNTER — Ambulatory Visit (HOSPITAL_COMMUNITY): Payer: Medicaid Other

## 2017-06-10 ENCOUNTER — Encounter: Payer: Self-pay | Admitting: Family Medicine

## 2017-06-10 ENCOUNTER — Ambulatory Visit (INDEPENDENT_AMBULATORY_CARE_PROVIDER_SITE_OTHER): Payer: Medicaid Other | Admitting: Family Medicine

## 2017-06-10 VITALS — BP 122/68 | HR 67 | Temp 98.2°F | Resp 14 | Ht 66.0 in | Wt 194.2 lb

## 2017-06-10 DIAGNOSIS — Z0000 Encounter for general adult medical examination without abnormal findings: Secondary | ICD-10-CM

## 2017-06-10 DIAGNOSIS — B373 Candidiasis of vulva and vagina: Secondary | ICD-10-CM | POA: Diagnosis not present

## 2017-06-10 DIAGNOSIS — B356 Tinea cruris: Secondary | ICD-10-CM | POA: Diagnosis not present

## 2017-06-10 DIAGNOSIS — B3731 Acute candidiasis of vulva and vagina: Secondary | ICD-10-CM

## 2017-06-10 MED ORDER — CLOTRIMAZOLE 2 % VA CREA: TOPICAL_CREAM | VAGINAL | 0 refills | Status: DC

## 2017-06-10 MED ORDER — FLUCONAZOLE 150 MG PO TABS: 150.0000 mg | ORAL_TABLET | Freq: Once | ORAL | 0 refills | Status: AC

## 2017-06-10 MED ORDER — NORGESTIM-ETH ESTRAD TRIPHASIC 0.18/0.215/0.25 MG-25 MCG PO TABS: 1.0000 | ORAL_TABLET | Freq: Every day | ORAL | 3 refills | Status: DC

## 2017-06-10 NOTE — Patient Instructions (Addendum)
F/u in January, call if you need me before  Please come for flu vaccine in September  Keep up great weight loss and exercise routine  One fluconazole tablet sent for yeast infection and cream sent for rash in genital area  Start birthcontrol pills on day one of next cycle, and use condoms  Thank you  for choosing [facility]. We consider it a privelige to serve you.  Delivering excellent health care in a caring and  compassionate way is our goal.  Partnering with you,  so that together we can achieve this goal is our strategy.   Excellent labs!  Stop weekly vit D once finished , start OTC vi t D3 2000 IU one daily

## 2017-06-13 DIAGNOSIS — B356 Tinea cruris: Secondary | ICD-10-CM | POA: Insufficient documentation

## 2017-06-13 DIAGNOSIS — B373 Candidiasis of vulva and vagina: Secondary | ICD-10-CM | POA: Insufficient documentation

## 2017-06-13 DIAGNOSIS — B3731 Acute candidiasis of vulva and vagina: Secondary | ICD-10-CM | POA: Insufficient documentation

## 2017-06-13 NOTE — Assessment & Plan Note (Signed)
Fluconazole x 1 tablet

## 2017-06-13 NOTE — Progress Notes (Signed)
    Stacey MuskratSonja L Palmer     MRN: [id number]      DOB: 02-Sep-1981  HPI: Patient is in for annual physical exam. C/o rash on  External genitalia, pruritic Recent labs,  are reviewed. Immunization is reviewed , and  updated if needed.   PE: BP 122/68 (BP Location: Left Arm, Patient Position: Sitting, Cuff Size: Normal)   Pulse 67   Temp 98.2 F (36.8 C) (Other (Comment))   Resp 14   Ht 5\' 6"  (1.676 m)   Wt 194 lb 4 oz (88.1 kg)   LMP 05/10/2017   SpO2 98%   BMI 31.35 kg/m   Pleasant  female, alert and oriented x 3, in no cardio-pulmonary distress. Afebrile. HEENT No facial trauma or asymetry. Sinuses non tender.  Extra occullar muscles intact, pupils equally reactive to light. External ears normal, tympanic membranes clear. Oropharynx moist, no exudate. Neck: supple, no adenopathy,JVD or thyromegaly.No bruits.  Chest: Clear to ascultation bilaterally.No crackles or wheezes. Non tender to palpation  Breast: No asymetry,no masses or lumps. No tenderness. No nipple discharge or inversion. No axillary or supraclavicular adenopathy  Cardiovascular system; Heart sounds normal,  S1 and  S2 ,no S3.  No murmur, or thrill. Apical beat not displaced Peripheral pulses normal.  Abdomen: Soft, non tender, no organomegaly or masses. No bruits. Bowel sounds normal. No guarding, tenderness or rebound.  Rectal:  Deferred, not indicated  GU: External genitalia normal female genitalia , normal female distribution of hair. No lesions. Urethral meatus normal in size, no  Prolapse, no lesions visibly  Present. Bladder non tender. Vagina pink and moist , with no visible lesions , discharge present . Adequate pelvic support no  cystocele or rectocele noted Cervix pink and appears healthy, no lesions or ulcerations noted, no discharge noted from os Uterus normal size, no adnexal masses, no cervical motion or adnexal tenderness.   Musculoskeletal exam: Full ROM of spine, hips , shoulders  and knees. No deformity ,swelling or crepitus noted. No muscle wasting or atrophy.   Neurologic: Cranial nerves 2 to 12 intact. Power, tone ,sensation and reflexes normal throughout. No disturbance in gait. No tremor.  Skin: Intact,, scaling  rash noted.at introitus, hypopigmented Pigmentation normal throughout  Psych; Normal mood and affect. Judgement and concentration normal   Assessment & Plan:  Annual physical exam Annual exam as documented. Counseling done  re healthy lifestyle involving commitment to 150 minutes exercise per week, heart healthy diet, and attaining healthy weight.The importance of adequate sleep also discussed. Regular seat belt use and home safety, is also discussed. Changes in health habits are decided on by the patient with goals and time frames  set for achieving them. Immunization and cancer screening needs are specifically addressed at this visit.   Tinea cruris Topical antifungal twice daily for 1 week, then as needed  Candidiasis of genitalia in female Fluconazole x 1 tablet

## 2017-06-13 NOTE — Assessment & Plan Note (Signed)
Topical antifungal twice daily for 1 week, then as needed

## 2017-06-13 NOTE — Assessment & Plan Note (Signed)

## 2017-06-15 ENCOUNTER — Ambulatory Visit (HOSPITAL_COMMUNITY): Admission: RE | Admit: 2017-06-15 | Payer: Medicaid Other | Source: Ambulatory Visit

## 2017-06-19 ENCOUNTER — Ambulatory Visit (HOSPITAL_COMMUNITY): Payer: Medicaid Other | Admitting: Psychiatry

## 2017-06-24 ENCOUNTER — Ambulatory Visit (HOSPITAL_COMMUNITY): Payer: Medicaid Other | Admitting: Psychiatry

## 2017-06-26 ENCOUNTER — Ambulatory Visit (HOSPITAL_COMMUNITY): Admission: RE | Admit: 2017-06-26 | Payer: Medicaid Other | Source: Ambulatory Visit

## 2017-07-03 ENCOUNTER — Ambulatory Visit (HOSPITAL_COMMUNITY): Payer: Self-pay | Admitting: Psychiatry

## 2017-07-16 ENCOUNTER — Ambulatory Visit (INDEPENDENT_AMBULATORY_CARE_PROVIDER_SITE_OTHER): Payer: Self-pay

## 2017-07-16 ENCOUNTER — Telehealth: Payer: Self-pay | Admitting: Family Medicine

## 2017-07-16 DIAGNOSIS — R309 Painful micturition, unspecified: Secondary | ICD-10-CM

## 2017-07-16 LAB — POCT URINALYSIS DIPSTICK
BILIRUBIN UA: NEGATIVE
GLUCOSE UA: NEGATIVE
KETONES UA: NEGATIVE
LEUKOCYTES UA: NEGATIVE
Nitrite, UA: NEGATIVE
PH UA: 6.5 (ref 5.0–8.0)
Protein, UA: NEGATIVE
Spec Grav, UA: 1.015 (ref 1.010–1.025)
Urobilinogen, UA: 1 E.U./dL

## 2017-07-16 NOTE — Telephone Encounter (Signed)
Left message on cell phone for patient to call back so she could make nurse visit.

## 2017-07-16 NOTE — Telephone Encounter (Signed)
Patient thinks she has UTI. She feels urge to urinate but can't.  This has been going on for 2 days.  She has low output.  She feels she cannot wait until Monday and is requesting something be called in @ [facility]    cb  [phone]

## 2017-07-20 ENCOUNTER — Telehealth: Payer: Self-pay | Admitting: Family Medicine

## 2017-07-20 NOTE — Telephone Encounter (Signed)
Based on her symptoms I recommend oTC zyrtec for her

## 2017-07-20 NOTE — Telephone Encounter (Signed)
Patient called in and stated that she has runny nose, sneezing, popping in her ears as well as itching. She wants to know if she can take the medication she received in July for the same thing because she still has it. (she didn't know what it was)  Cb# [phone]

## 2017-07-20 NOTE — Telephone Encounter (Signed)
Called patient regarding message below. No answer, unable to leave message.  

## 2017-07-21 ENCOUNTER — Ambulatory Visit: Payer: Self-pay

## 2017-07-22 ENCOUNTER — Ambulatory Visit (INDEPENDENT_AMBULATORY_CARE_PROVIDER_SITE_OTHER): Payer: Self-pay

## 2017-07-22 DIAGNOSIS — Z23 Encounter for immunization: Secondary | ICD-10-CM

## 2017-08-03 ENCOUNTER — Ambulatory Visit (HOSPITAL_COMMUNITY): Payer: Self-pay | Admitting: Psychiatry

## 2017-08-19 ENCOUNTER — Ambulatory Visit (INDEPENDENT_AMBULATORY_CARE_PROVIDER_SITE_OTHER): Payer: Medicaid Other | Admitting: Psychiatry

## 2017-08-19 DIAGNOSIS — F4321 Adjustment disorder with depressed mood: Secondary | ICD-10-CM
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
## 2017-09-08 ENCOUNTER — Ambulatory Visit (HOSPITAL_COMMUNITY): Payer: Self-pay | Admitting: Psychiatry

## 2017-09-22 ENCOUNTER — Encounter (HOSPITAL_COMMUNITY): Payer: Self-pay | Admitting: Psychiatry

## 2017-09-22 ENCOUNTER — Ambulatory Visit (INDEPENDENT_AMBULATORY_CARE_PROVIDER_SITE_OTHER): Payer: Medicaid Other | Admitting: Psychiatry

## 2017-09-22 DIAGNOSIS — F4321 Adjustment disorder with depressed mood: Secondary | ICD-10-CM
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
## 2017-10-12 ENCOUNTER — Ambulatory Visit (HOSPITAL_COMMUNITY): Payer: Self-pay | Admitting: Psychiatry

## 2017-10-13 ENCOUNTER — Encounter (HOSPITAL_COMMUNITY): Payer: Self-pay | Admitting: *Deleted

## 2017-10-13 ENCOUNTER — Other Ambulatory Visit: Payer: Self-pay

## 2017-10-13 ENCOUNTER — Emergency Department (HOSPITAL_COMMUNITY)
Admission: EM | Admit: 2017-10-13 | Discharge: 2017-10-13 | Disposition: A | Discharge status: Home / Self Care | Payer: Medicaid Other | Attending: Emergency Medicine | Admitting: Emergency Medicine

## 2017-10-13 DIAGNOSIS — R21 Rash and other nonspecific skin eruption: Secondary | ICD-10-CM | POA: Diagnosis present

## 2017-10-13 DIAGNOSIS — L239 Allergic contact dermatitis, unspecified cause: Secondary | ICD-10-CM | POA: Insufficient documentation

## 2017-10-13 DIAGNOSIS — L235 Allergic contact dermatitis due to other chemical products: Secondary | ICD-10-CM

## 2017-10-13 DIAGNOSIS — Z79899 Other long term (current) drug therapy: Secondary | ICD-10-CM | POA: Insufficient documentation

## 2017-10-13 MED ORDER — DIPHENHYDRAMINE HCL 25 MG PO CAPS
50.0000 mg | ORAL_CAPSULE | Freq: Once | ORAL | Status: AC
  Administered 2017-10-13: 50 mg via ORAL
  Filled 2017-10-13: qty 2

## 2017-10-13 MED ORDER — PREDNISONE 10 MG PO TABS: ORAL_TABLET | ORAL | 0 refills | Status: DC

## 2017-10-13 MED ORDER — PREDNISONE 50 MG PO TABS
60.0000 mg | ORAL_TABLET | Freq: Every day | ORAL | Status: DC
  Administered 2017-10-13: 60 mg via ORAL
  Filled 2017-10-13: qty 1

## 2017-10-13 NOTE — ED Notes (Signed)
Pt states itching today and rash. Thinks it maybe from bubble bath. Denies any new foods or clothes. Pt took 2 benadryl around 1400.

## 2017-10-13 NOTE — ED Notes (Signed)
Pt alert & oriented x4, stable gait. Patient given discharge instructions, paperwork & prescription(s). Patient  instructed to stop at the registration desk to finish any additional paperwork. Patient verbalized understanding. Pt left department w/ no further questions. 

## 2017-10-13 NOTE — Discharge Instructions (Signed)
Benadryl 25 mg every 4 hours for itching  

## 2017-10-13 NOTE — ED Triage Notes (Signed)
Pt reports today she began to get a rash on both arms, neck, and the left side of her back. Pt reports having a headache when the rash started.

## 2017-10-13 NOTE — ED Provider Notes (Signed)
[name] EMERGENCY DEPARTMENT Provider Note   CSN: [id number] Arrival date & time: 10/13/17  1921     History   Chief Complaint Chief Complaint  Patient presents with  . Rash    HPI Stacey Palmer is a 36 y.o. female.  The history is provided by the patient. No language interpreter was used.  Rash   This is a new problem. The current episode started yesterday. The problem is associated with nothing. There has been no fever. The rash is present on the face, abdomen, back, torso and scalp. The patient is experiencing no pain. The pain has been constant since onset. Associated symptoms include itching. She has tried nothing for the symptoms.  Pt complains of itching and a rash after using a lotion today.  Pt took benadryl earlier.  Past Medical History:  Diagnosis Date  . Allergy   . Anxiety   . Obesity   . Sleep apnea     Patient Active Problem List   Diagnosis Date Noted  . Tinea cruris 06/13/2017  . Candidiasis of genitalia in female 06/13/2017  . Thyromegaly 06/04/2017  . GERD (gastroesophageal reflux disease) 06/04/2017  . Adjustment disorder with depressed mood 01/27/2017  . Neck muscle spasm 12/30/2016  . Impaired fasting glucose 10/21/2016  . Vitamin D deficiency 10/21/2016  . Bariatric surgery status 07/11/2016  . Annual physical exam 05/09/2016  . Sleep apnea 06/11/2015  . Obesity (BMI 30.0-34.9) 06/11/2015  . 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 Skin tag 06/05/2014  . Allergic rhinitis 06/05/2014  . Depression 05/09/2013  . FATIGUE 04/04/2010    Past Surgical History:  Procedure Laterality Date  . CESAREAN SECTION    . TUBAL LIGATION      OB History    No data available       Home Medications    Prior to Admission medications   Medication Sig Start Date End Date Taking? Authorizing Provider  acetaminophen (TYLENOL) 500 MG tablet Take 1 tablet (500 mg total) by mouth every 6 (six) hours as needed. 12/25/16   Burgess AmorIdol, Julie, PA-C    acyclovir (ZOVIRAX) 400 MG tablet Take 1 tablet (400 mg total) by mouth 3 (three) times daily. 06/04/17   Kerri PerchesSimpson, Margaret E, MD  azithromycin (ZITHROMAX) 250 MG tablet Two tablets on day one, then one tablet once daily for an additional 4 days 06/04/17   Kerri PerchesSimpson, Margaret E, MD  calcium carbonate (TUMS - DOSED IN MG ELEMENTAL CALCIUM) 500 MG chewable tablet Chew 500 mg by mouth daily.    [provider]  cetirizine (ZYRTEC) 10 MG tablet Take 1 tablet (10 mg total) by mouth daily. 07/11/16   Eustace MooreNelson, Yvonne Sue, MD  clotrimazole (GYNE-LOTRIMIN 3) 2 % vaginal cream Apply twice daily to skin in genital area for 1 week , then as needed 06/10/17   Kerri PerchesSimpson, Margaret E, MD  clotrimazole-betamethasone (LOTRISONE) cream Apply twice daily to rash for 10 days, then , as needed Patient not taking: Reported on 02/19/2017 03/25/16   Kerri PerchesSimpson, Margaret E, MD  cyclobenzaprine (FLEXERIL) 10 MG tablet Take 1 tablet (10 mg total) by mouth at bedtime. Patient not taking: Reported on 06/04/2017 01/20/17   Darreld McleanKeeling, Wayne, MD  fluticasone [facility](FLONASE) 50 MCG/ACT nasal spray Place 2 sprays into both nostrils daily. 07/11/16   Eustace MooreNelson, Yvonne Sue, MD  montelukast (SINGULAIR) 10 MG tablet Take 1 tablet (10 mg total) by mouth at bedtime. 01/29/16   Kerri PerchesSimpson, Margaret E, MD  Norgestimate-Ethinyl Estradiol Triphasic 0.18/0.215/0.25 MG-25 MCG tab  Take 1 tablet by mouth daily. 06/10/17   Kerri Perches, MD  omeprazole (PRILOSEC) 20 MG capsule Take 20 mg by mouth daily. 11/10/16   [provider]  pantoprazole (PROTONIX) 20 MG tablet Take 1 tablet (20 mg total) by mouth daily. 06/04/17   Kerri Perches, MD  predniSONE (DELTASONE) 10 MG tablet 5,4,3,2,1 taper 10/13/17   Elson Areas, PA-C  Vitamin D, Ergocalciferol, (DRISDOL) 50000 units CAPS capsule TAKE 1 CAPSULE BY MOUTH ONCE A WEEK AS DIRECTED 05/05/17   Kerri Perches, MD    Family History Family History  Problem Relation Age of Onset  . Hypertension Mother   .  Kidney disease Father   . Diabetes Father     Social History Social History   Tobacco Use  . Smoking status: Never Smoker  . Smokeless tobacco: Never Used  Substance Use Topics  . Alcohol use: No    Comment: 01-27-2017 per pt occa. Wine  . Drug use: No    Comment: 01-27-2017 per pt no     Allergies   Shellfish allergy   Review of Systems Review of Systems  Skin: Positive for itching and rash.  All other systems reviewed and are negative.    Physical Exam Updated Vital Signs BP 133/74 (BP Location: Right Arm)   Pulse (!) 55   Temp 97.8 F (36.6 C) (Oral)   Resp 16   Ht 5\' 7"  (1.702 m)   Wt 86.4 kg (190 lb 9 oz)   LMP 10/02/2017   SpO2 100%   BMI 29.85 kg/m   Physical Exam  Constitutional: She appears well-developed and well-nourished. No distress.  HENT:  Head: Normocephalic and atraumatic.  Mouth/Throat: Oropharynx is clear and moist.  Eyes: Conjunctivae are normal.  Neck: Neck supple.  Cardiovascular: Normal rate and regular rhythm.  No murmur heard. Pulmonary/Chest: Effort normal and breath sounds normal. No respiratory distress.  Abdominal: Soft. There is no tenderness.  Musculoskeletal: She exhibits no edema.  Neurological: She is alert.  Skin: Skin is warm and dry.  Psychiatric: She has a normal mood and affect.  Nursing note and vitals reviewed.    ED Treatments / Results  Labs (all labs ordered are listed, but only abnormal results are displayed) Labs Reviewed - No data to display  EKG  EKG Interpretation None       Radiology No results found.  Procedures Procedures (including critical care time)  Medications Ordered in ED Medications  predniSONE (DELTASONE) tablet 60 mg (60 mg Oral Given 10/13/17 2118)  diphenhydrAMINE (BENADRYL) capsule 50 mg (50 mg Oral Given 10/13/17 2118)     Initial Impression / Assessment and Plan / ED Course  I have reviewed the triage vital signs and the nursing notes.  Pertinent labs & imaging  results that were available during my care of the patient were reviewed by me and considered in my medical decision making (see chart for details).     No oral swelling, no shortness of breath.  Symptoms consistent contact dermatitis. Pt advised to make sure she washes all off.  Final Clinical Impressions(s) / ED Diagnoses   Final diagnoses:  Allergic dermatitis due to other chemical product    ED Discharge Orders        Ordered    predniSONE (DELTASONE) 10 MG tablet     10/13/17 2113    An After Visit Summary was printed and given to the patient.    Elson Areas, [place] 10/13/17 2127  Maia PlanLong, Joshua G, MD 10/14/17 [phone]

## 2017-10-16 ENCOUNTER — Ambulatory Visit (INDEPENDENT_AMBULATORY_CARE_PROVIDER_SITE_OTHER): Payer: Medicaid Other | Admitting: Psychiatry

## 2017-10-16 ENCOUNTER — Encounter (HOSPITAL_COMMUNITY): Payer: Self-pay | Admitting: Psychiatry

## 2017-10-16 DIAGNOSIS — F4321 Adjustment disorder with depressed mood: Secondary | ICD-10-CM | POA: Diagnosis not present
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
## 2017-10-19 ENCOUNTER — Ambulatory Visit (HOSPITAL_COMMUNITY): Payer: Self-pay | Admitting: Psychiatry

## 2017-10-30 ENCOUNTER — Ambulatory Visit (INDEPENDENT_AMBULATORY_CARE_PROVIDER_SITE_OTHER): Payer: Medicaid Other | Admitting: Psychiatry

## 2017-10-30 ENCOUNTER — Encounter (HOSPITAL_COMMUNITY): Payer: Self-pay | Admitting: Psychiatry

## 2017-10-30 DIAGNOSIS — F4321 Adjustment disorder with depressed mood: Secondary | ICD-10-CM
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
## 2017-11-10 ENCOUNTER — Ambulatory Visit: Payer: Self-pay | Admitting: Family Medicine

## 2017-11-12 ENCOUNTER — Ambulatory Visit: Payer: Self-pay | Admitting: Family Medicine

## 2017-11-12 ENCOUNTER — Ambulatory Visit (HOSPITAL_COMMUNITY): Payer: Self-pay | Admitting: Psychiatry

## 2017-11-19 ENCOUNTER — Encounter: Payer: Self-pay | Admitting: Family Medicine

## 2017-11-19 ENCOUNTER — Other Ambulatory Visit (HOSPITAL_COMMUNITY)
Admission: RE | Admit: 2017-11-19 | Discharge: 2017-11-19 | Disposition: A | Discharge status: Home / Self Care | Payer: Medicaid Other | Source: Ambulatory Visit | Attending: Family Medicine | Admitting: Family Medicine

## 2017-11-19 ENCOUNTER — Ambulatory Visit: Payer: Self-pay | Admitting: Family Medicine

## 2017-11-19 ENCOUNTER — Ambulatory Visit (INDEPENDENT_AMBULATORY_CARE_PROVIDER_SITE_OTHER): Payer: Medicaid Other | Admitting: Family Medicine

## 2017-11-19 VITALS — BP 118/82 | HR 78 | Resp 16 | Ht 66.0 in | Wt 192.0 lb

## 2017-11-19 DIAGNOSIS — N760 Acute vaginitis: Secondary | ICD-10-CM | POA: Insufficient documentation

## 2017-11-19 DIAGNOSIS — B009 Herpesviral infection, unspecified: Secondary | ICD-10-CM | POA: Diagnosis not present

## 2017-11-19 DIAGNOSIS — J309 Allergic rhinitis, unspecified: Secondary | ICD-10-CM | POA: Diagnosis not present

## 2017-11-19 DIAGNOSIS — E669 Obesity, unspecified: Secondary | ICD-10-CM | POA: Diagnosis not present

## 2017-11-19 MED ORDER — FLUCONAZOLE 150 MG PO TABS: ORAL_TABLET | ORAL | 0 refills | Status: DC

## 2017-11-19 MED ORDER — MONTELUKAST SODIUM 10 MG PO TABS: 10.0000 mg | ORAL_TABLET | Freq: Every day | ORAL | 3 refills | Status: DC

## 2017-11-19 NOTE — Patient Instructions (Addendum)
Cacel Feb appt, please reschedule for CPE and pap due July 7 pr after  Call if you need me sooner  Medications are sent in for allergies and  yeast infection  Please continue healthy eating, counseling and start exercise   All the best for 2019!  WE will contact you if urine is infected  Thank you  for choosing [facility]. We consider it a privelige to serve you.  Delivering excellent health care in a caring and  compassionate way is our goal.  Partnering with you,  so that together we can achieve this goal is our strategy.

## 2017-11-21 ENCOUNTER — Encounter: Payer: Self-pay | Admitting: Family Medicine

## 2017-11-21 NOTE — Assessment & Plan Note (Signed)
Symptomatic, specimen sent for testing and will treat based on result. Fluconazole 150 mg #1 prescribed

## 2017-11-21 NOTE — Assessment & Plan Note (Signed)
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

## 2017-11-21 NOTE — Progress Notes (Signed)
   Stacey MuskratSonja Palmer Date     MRN: [id number]      DOB: 06/25/1981   HPI Ms. Stacey Palmer is here with a 3 day h/o right ear pain , intermittent chills, and head congestion, her nasal drainage is clear , she denies sore throat, fever or  Cough. C/o itchy vaginal discharge    ROS  Denies chest pains, palpitations and leg swelling Denies abdominal pain, nausea, vomiting,diarrhea or constipation.   Denies dysuria, frequency, hesitancy or incontinence. Denies joint pain, swelling and limitation in mobility. Denies headaches, seizures, numbness, or tingling. Denies uncontrolled  depression, anxiety or insomnia.Benefitting from therapy..     PE  BP 118/82   Pulse 78   Resp 16   Ht 5\' 6"  (1.676 m)   Wt 192 lb (87.1 kg)   SpO2 98%   BMI 30.99 kg/m   Patient alert and oriented and in no cardiopulmonary distress.  HEENT: No facial asymmetry, EOMI,   oropharynx pink and moist.  Neck supple no JVD, no mass.TM clear, no sinus tenderness, erythema and edema of nasal mucosa, clear nasal drainage Chest: Clear to auscultation bilaterally.  CVS: S1, S2 no murmurs, no S3.Regular rate.  ABD: Soft non tender.   Ext: No edema  MS: Adequate ROM spine, shoulders, hips and knees.  Skin: Intact, no ulcerations or rash noted.  Psych: Good eye contact, normal affect. Memory intact not anxious or depressed appearing.  CNS: CN 2-12 intact, power,  normal throughout.no focal deficits noted.   Assessment & Plan Allergic rhinitis Uncontrolled , medication to be taken consistently and is prescribed  Vaginitis and vulvovaginitis Symptomatic, specimen sent for testing and will treat based on result. Fluconazole 150 mg #1 prescribed  Obesity (BMI 30.0-34.9) Deteriorated. Patient re-educated about  the importance of commitment to a  minimum of 150 minutes of exercise per week.  The importance of healthy food choices with portion control discussed. Encouraged to start a food diary, count calories and to  consider  joining a support group. Sample diet sheets offered. Goals set by the patient for the next several months.   Weight /BMI 11/19/2017 10/13/2017 06/10/2017  WEIGHT 192 lb 190 lb 9 oz 194 lb 4 oz  HEIGHT 5\' 6"  5\' 7"  5\' 6"   BMI 30.99 kg/m2 29.85 kg/m2 31.35 kg/m2  Some encounter information is confidential and restricted. Go to Review Flowsheets activity to see all data.      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

## 2017-11-21 NOTE — Assessment & Plan Note (Signed)
Deteriorated. Patient re-educated about  the importance of commitment to a  minimum of 150 minutes of exercise per week.  The importance of healthy food choices with portion control discussed. Encouraged to start a food diary, count calories and to consider  joining a support group. Sample diet sheets offered. Goals set by the patient for the next several months.   Weight /BMI 11/19/2017 10/13/2017 06/10/2017  WEIGHT 192 lb 190 lb 9 oz 194 lb 4 oz  HEIGHT 5\' 6"  5\' 7"  5\' 6"   BMI 30.99 kg/m2 29.85 kg/m2 31.35 kg/m2  Some encounter information is confidential and restricted. Go to Review Flowsheets activity to see all data.

## 2017-11-21 NOTE — Assessment & Plan Note (Signed)
Uncontrolled , medication to be taken consistently and is prescribed

## 2017-11-23 ENCOUNTER — Encounter: Payer: Self-pay | Admitting: Family Medicine

## 2017-11-23 LAB — URINE CYTOLOGY ANCILLARY ONLY
CHLAMYDIA, DNA PROBE: NEGATIVE
NEISSERIA GONORRHEA: NEGATIVE
Trichomonas: NEGATIVE

## 2017-11-25 ENCOUNTER — Encounter: Payer: Self-pay | Admitting: Family Medicine

## 2017-11-26 LAB — URINE CYTOLOGY ANCILLARY ONLY
BACTERIAL VAGINITIS: NEGATIVE
CANDIDA VAGINITIS: NEGATIVE

## 2017-12-14 ENCOUNTER — Ambulatory Visit (INDEPENDENT_AMBULATORY_CARE_PROVIDER_SITE_OTHER): Payer: Medicaid Other | Admitting: Psychiatry

## 2017-12-14 ENCOUNTER — Ambulatory Visit: Payer: Self-pay | Admitting: Family Medicine

## 2017-12-14 ENCOUNTER — Encounter (HOSPITAL_COMMUNITY): Payer: Self-pay | Admitting: Psychiatry

## 2017-12-14 DIAGNOSIS — F4321 Adjustment disorder with depressed mood: Secondary | ICD-10-CM
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
## 2017-12-15 ENCOUNTER — Telehealth: Payer: Self-pay | Admitting: Family Medicine

## 2017-12-15 MED ORDER — UNABLE TO FIND: 0 refills | Status: DC

## 2017-12-15 NOTE — Telephone Encounter (Signed)
Patient called in to let you know she is still having pain in her right ear. She is requesting medication  Cb#: [phone]

## 2017-12-15 NOTE — Telephone Encounter (Signed)
She may use HB otic 5 drops to affected ear 3 times daily for 5 days. Then as needed. If this does not cause relief, she should see an ENT  Doc Please send in the script

## 2017-12-15 NOTE — Telephone Encounter (Signed)
Since there is no drop for ear pain do you want me to advise otc tylenol?

## 2017-12-15 NOTE — Telephone Encounter (Signed)
rx sent and left message for patient

## 2017-12-21 ENCOUNTER — Other Ambulatory Visit: Payer: Self-pay

## 2017-12-21 ENCOUNTER — Ambulatory Visit (INDEPENDENT_AMBULATORY_CARE_PROVIDER_SITE_OTHER): Payer: Medicaid Other | Admitting: Family Medicine

## 2017-12-21 ENCOUNTER — Encounter: Payer: Self-pay | Admitting: Family Medicine

## 2017-12-21 VITALS — BP 100/74 | HR 88 | Temp 97.9°F | Resp 18 | Ht 66.0 in | Wt 194.0 lb

## 2017-12-21 DIAGNOSIS — R6889 Other general symptoms and signs: Secondary | ICD-10-CM

## 2017-12-21 LAB — POCT INFLUENZA A/B
Influenza A, POC: NEGATIVE
Influenza B, POC: NEGATIVE

## 2017-12-21 MED ORDER — OSELTAMIVIR PHOSPHATE 75 MG PO CAPS: 75.0000 mg | ORAL_CAPSULE | Freq: Two times a day (BID) | ORAL | 0 refills | Status: DC

## 2017-12-21 MED ORDER — ONDANSETRON HCL 4 MG PO TABS: 4.0000 mg | ORAL_TABLET | Freq: Three times a day (TID) | ORAL | 0 refills | Status: DC | PRN

## 2017-12-21 NOTE — Progress Notes (Signed)
Chief Complaint  Patient presents with  . URI  . Cough   She states that she had a sore throat 1 day, the next day she felt body aches, fatigue, hot and cold sweats, runny stuffy nose, sinus pressure, and decreased appetite.  Mild nausea.  No vomiting.  The following day, today, she developed diarrhea.  She tried to go to work today but was sent home.  She is here for evaluation.  3 children at home.  No known exposure to influenza.  She did get a flu shot this year.  No known exposure to strep.  She has taken some over-the-counter medications, no relief.  Patient Active Problem List   Diagnosis Date Noted  . Tinea cruris 06/13/2017  . Candidiasis of genitalia in female 06/13/2017  . Thyromegaly 06/04/2017  . GERD (gastroesophageal reflux disease) 06/04/2017  . Adjustment disorder with depressed mood 01/27/2017  . Impaired fasting glucose 10/21/2016  . Vitamin D deficiency 10/21/2016  . Bariatric surgery status 07/11/2016  . Obesity (BMI 30.0-34.9) 06/11/2015  . 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 Skin tag 06/05/2014  . Vaginitis and vulvovaginitis 06/05/2014  . Allergic rhinitis 06/05/2014  . Depression 05/09/2013  . FATIGUE 04/04/2010    Outpatient Encounter Medications as of 12/21/2017  Medication Sig  . acetaminophen (TYLENOL) 500 MG tablet Take 1 tablet (500 mg total) by mouth every 6 (six) hours as needed.  Marland Kitchen acyclovir (ZOVIRAX) 400 MG tablet Take 1 tablet (400 mg total) by mouth 3 (three) times daily.  . calcium carbonate (TUMS - DOSED IN MG ELEMENTAL CALCIUM) 500 MG chewable tablet Chew 500 mg by mouth daily.  . clotrimazole (GYNE-LOTRIMIN 3) 2 % vaginal cream Apply twice daily to skin in genital area for 1 week , then as needed  . clotrimazole-betamethasone (LOTRISONE) cream Apply twice daily to rash for 10 days, then , as needed  . cyclobenzaprine (FLEXERIL) 10 MG tablet Take 1 tablet (10 mg total) by mouth at bedtime.  . fluconazole (DIFLUCAN) 150 MG tablet One  tablet once for vaginal itch  . fluticasone (FLONASE) 50 MCG/ACT nasal spray Place 2 sprays into both nostrils daily.  . montelukast (SINGULAIR) 10 MG tablet Take 1 tablet (10 mg total) by mouth at bedtime.  . Norgestimate-Ethinyl Estradiol Triphasic 0.18/0.215/0.25 MG-25 MCG tab Take 1 tablet by mouth daily.  Marland Kitchen omeprazole (PRILOSEC) 20 MG capsule Take 20 mg by mouth daily.  . pantoprazole (PROTONIX) 20 MG tablet Take 1 tablet (20 mg total) by mouth daily.  Marland Kitchen UNABLE TO FIND HB Otic Use 5 drops in affected ear three times daily as needed  . Vitamin D, Ergocalciferol, (DRISDOL) 50000 units CAPS capsule TAKE 1 CAPSULE BY MOUTH ONCE A WEEK AS DIRECTED  . ondansetron (ZOFRAN) 4 MG tablet Take 1 tablet (4 mg total) by mouth every 8 (eight) hours as needed for nausea or vomiting.  Marland Kitchen oseltamivir (TAMIFLU) 75 MG capsule Take 1 capsule (75 mg total) by mouth 2 (two) times daily.   No facility-administered encounter medications on file as of 12/21/2017.     Allergies  Allergen Reactions  . Shellfish Allergy Hives    Reports only once (she did not have any episode afterwards)    Review of Systems  Constitutional: Positive for appetite change, chills, diaphoresis and fatigue. Negative for activity change.  HENT: Positive for congestion, postnasal drip, rhinorrhea and sore throat.   Eyes: Positive for pain. Negative for redness and visual disturbance.       Eyes  are "burning"  Respiratory: Positive for cough. Negative for wheezing.   Gastrointestinal: Positive for diarrhea. Negative for abdominal pain, nausea and vomiting.  Genitourinary: Negative for difficulty urinating and frequency.  Musculoskeletal: Positive for myalgias. Negative for arthralgias.  Neurological: Positive for headaches. Negative for dizziness.  Psychiatric/Behavioral: Negative for sleep disturbance. The patient is not nervous/anxious.     BP 100/74 (BP Location: Left Arm, Patient Position: Sitting, Cuff Size: Normal)   Pulse  88   Temp 97.9 F (36.6 C) (Temporal)   Resp 18   Ht 5\' 6"  (1.676 m)   Wt 194 lb 0.6 oz (88 kg)   SpO2 97%   BMI 31.32 kg/m   Physical Exam  Constitutional: She is oriented to person, place, and time. She appears well-developed and well-nourished. She appears distressed.  Appears moderately ill, very tired  HENT:  Head: Normocephalic and atraumatic.  Right Ear: External ear normal.  Left Ear: External ear normal.  Mouth/Throat: Oropharynx is clear and moist.  Both TMs with mild injection.  Posterior pharynx injected with no exudate.  No sinus tenderness  Eyes: Conjunctivae are normal. Pupils are equal, round, and reactive to light.  Neck: Normal range of motion.  Cardiovascular: Normal rate, regular rhythm and normal heart sounds.  Pulmonary/Chest: Effort normal and breath sounds normal. She has no wheezes. She has no rales.  Abdominal: Soft. Bowel sounds are normal. There is no tenderness.  Lymphadenopathy:    She has no cervical adenopathy.  Neurological: She is alert and oriented to person, place, and time.  Psychiatric: She has a normal mood and affect. Her behavior is normal. Thought content normal.    ASSESSMENT/PLAN:  1. Flu-like symptoms Patient's symptoms are sufficiently flulike to warrant Tamiflu.  Sudden onset of respiratory symptoms with severe body aches and fatigue are suspicious.  Flu test is negative, but is inconclusive. - POCT Influenza A/B   Patient Instructions  Drink plenty of water Take the tamiflu twice a day Take the zofran as needed nausea  Call for problems  Out of work note    Eustace MooreYvonne Sue , MD

## 2017-12-21 NOTE — Patient Instructions (Signed)
Drink plenty of water Take the tamiflu twice a day Take the zofran as needed nausea  Call for problems  Out of work note

## 2017-12-28 ENCOUNTER — Telehealth: Payer: Self-pay | Admitting: Family Medicine

## 2017-12-28 NOTE — Telephone Encounter (Signed)
May give off until Wed the 27th

## 2017-12-28 NOTE — Telephone Encounter (Signed)
Seen 2 18 19 

## 2017-12-28 NOTE — Telephone Encounter (Signed)
Patient is requesting an extension on her work note  Cb#: [phone]

## 2018-01-12 ENCOUNTER — Ambulatory Visit (INDEPENDENT_AMBULATORY_CARE_PROVIDER_SITE_OTHER): Payer: Medicaid Other | Admitting: Psychiatry

## 2018-01-12 ENCOUNTER — Encounter (HOSPITAL_COMMUNITY): Payer: Self-pay | Admitting: Psychiatry

## 2018-01-12 DIAGNOSIS — F4321 Adjustment disorder with depressed mood: Secondary | ICD-10-CM | POA: Diagnosis not present
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
## 2018-01-20 ENCOUNTER — Encounter: Payer: Self-pay | Admitting: Family Medicine

## 2018-01-20 ENCOUNTER — Ambulatory Visit (HOSPITAL_COMMUNITY)
Admission: RE | Admit: 2018-01-20 | Discharge: 2018-01-20 | Disposition: A | Discharge status: Home / Self Care | Payer: Medicaid Other | Source: Ambulatory Visit | Attending: Family Medicine | Admitting: Family Medicine

## 2018-01-20 ENCOUNTER — Ambulatory Visit (INDEPENDENT_AMBULATORY_CARE_PROVIDER_SITE_OTHER): Payer: Medicaid Other | Admitting: Family Medicine

## 2018-01-20 VITALS — BP 118/84 | HR 85 | Temp 98.9°F | Resp 16 | Ht 66.0 in | Wt 189.0 lb

## 2018-01-20 DIAGNOSIS — F329 Major depressive disorder, single episode, unspecified: Secondary | ICD-10-CM | POA: Diagnosis not present

## 2018-01-20 DIAGNOSIS — J209 Acute bronchitis, unspecified: Secondary | ICD-10-CM | POA: Diagnosis not present

## 2018-01-20 DIAGNOSIS — E669 Obesity, unspecified: Secondary | ICD-10-CM

## 2018-01-20 DIAGNOSIS — F32A Depression, unspecified: Secondary | ICD-10-CM

## 2018-01-20 DIAGNOSIS — J40 Bronchitis, not specified as acute or chronic: Secondary | ICD-10-CM

## 2018-01-20 DIAGNOSIS — J0100 Acute maxillary sinusitis, unspecified: Secondary | ICD-10-CM

## 2018-01-20 MED ORDER — AZITHROMYCIN 250 MG PO TABS: ORAL_TABLET | ORAL | 0 refills | Status: DC

## 2018-01-20 MED ORDER — BENZONATATE 100 MG PO CAPS: 100.0000 mg | ORAL_CAPSULE | Freq: Two times a day (BID) | ORAL | 0 refills | Status: DC | PRN

## 2018-01-20 MED ORDER — PREDNISONE 5 MG PO TABS: 5.0000 mg | ORAL_TABLET | Freq: Two times a day (BID) | ORAL | 0 refills | Status: AC

## 2018-01-20 MED ORDER — VENLAFAXINE HCL ER 37.5 MG PO CP24
37.5000 mg | ORAL_CAPSULE | Freq: Every day | ORAL | 3 refills | Status: DC
Start: 2018-01-20 — End: 2018-08-19

## 2018-01-20 NOTE — Progress Notes (Signed)
   Stacey MuskratSonja L Palmer     MRN: [id number]      DOB: 1981-03-04   HPI Ms. Stacey Palmer is here with a 4 day h/o head congestion, sneezing , cough and yellow nasal drainage and sputum, progressively worsening, feels ill.Recently treated for influenza Depression and anxiety not well controlled, not suicidal or homicidal , but does not feel as hough she is doing as well as she would like, stressed about her daughter    ROS . Denies chest pains, palpitations and leg swelling Denies abdominal pain, nausea, vomiting,diarrhea or constipation.   Denies dysuria, frequency, hesitancy or incontinence. Denies joint pain, swelling and limitation in mobility. Denies headaches, seizures, numbness, or tingling.  Denies skin break down or rash.   PE  BP 118/84   Pulse 85   Temp 98.9 F (37.2 C) (Oral)   Resp 16   Ht 5\' 6"  (1.676 m)   Wt 189 lb (85.7 kg)   SpO2 98%   BMI 30.51 kg/m   Patient alert and oriented and in no cardiopulmonary distress.  HEENT: No facial asymmetry, EOMI,   oropharynx pink and moist.  Neck supple no JVD, no mass.Maxillary sinus tenderness  Chest: decrease air entry bilateral crackles  CVS: S1, S2 no murmurs, no S3.Regular rate.  ABD: Soft non tender.   Ext: No edema  MS: Adequate ROM spine, shoulders, hips and knees.  Skin: Intact, no ulcerations or rash noted.  Psych: Good eye contact, normal affect. Memory intact not anxious or depressed appearing.  CNS: CN 2-12 intact, power,  normal throughout.no focal deficits noted.   Assessment & Plan  Depression Not suicidal or homicidal Start effexor, and continue therapy  Acute bronchitis Decongestant and antibiotic prescribed, needs CXR today Work excuse from today , return 01/25/2018  Acute sinusitis Antibiotic prescribed  Obesity (BMI 30.0-34.9) Improved Patient re-educated about  the importance of commitment to a  minimum of 150 minutes of exercise per week.  The importance of healthy food choices with portion  control discussed. Encouraged to start a food diary, count calories and to consider  joining a support group. Sample diet sheets offered. Goals set by the patient for the next several months.   Weight /BMI 01/20/2018 12/21/2017 11/19/2017  WEIGHT 189 lb 194 lb 0.6 oz 192 lb  HEIGHT 5\' 6"  5\' 6"  5\' 6"   BMI 30.51 kg/m2 31.32 kg/m2 30.99 kg/m2  Some encounter information is confidential and restricted. Go to Review Flowsheets activity to see all data.

## 2018-01-20 NOTE — Assessment & Plan Note (Signed)
Decongestant and antibiotic prescribed, needs CXR today Work excuse from today , return 01/25/2018

## 2018-01-20 NOTE — Patient Instructions (Addendum)
F/u end May, call if you eed me sooner  You are treated for acute sinusitis and bronchitis ,please get a cXR today  Medication is sent in   Also for depression   Work excuse start today and return next Monday

## 2018-01-20 NOTE — Assessment & Plan Note (Signed)
Improved Patient re-educated about  the importance of commitment to a  minimum of 150 minutes of exercise per week.  The importance of healthy food choices with portion control discussed. Encouraged to start a food diary, count calories and to consider  joining a support group. Sample diet sheets offered. Goals set by the patient for the next several months.   Weight /BMI 01/20/2018 12/21/2017 11/19/2017  WEIGHT 189 lb 194 lb 0.6 oz 192 lb  HEIGHT 5\' 6"  5\' 6"  5\' 6"   BMI 30.51 kg/m2 31.32 kg/m2 30.99 kg/m2  Some encounter information is confidential and restricted. Go to Review Flowsheets activity to see all data.

## 2018-01-20 NOTE — Assessment & Plan Note (Signed)
Antibiotic prescribed 

## 2018-01-20 NOTE — Assessment & Plan Note (Signed)
>>  ASSESSMENT AND PLAN FOR DEPRESSION WRITTEN ON 01/20/2018 11:13 PM BY SIMPSON, Milus Mallick, MD  Not suicidal or homicidal Start effexor, and continue therapy

## 2018-01-20 NOTE — Assessment & Plan Note (Signed)
Not suicidal or homicidal Start effexor, and continue therapy

## 2018-01-22 ENCOUNTER — Encounter: Payer: Self-pay | Admitting: Family Medicine

## 2018-01-26 ENCOUNTER — Ambulatory Visit (HOSPITAL_COMMUNITY): Payer: Self-pay | Admitting: Psychiatry

## 2018-01-27 ENCOUNTER — Ambulatory Visit (INDEPENDENT_AMBULATORY_CARE_PROVIDER_SITE_OTHER): Payer: Medicaid Other

## 2018-01-27 DIAGNOSIS — R300 Dysuria: Secondary | ICD-10-CM | POA: Diagnosis not present

## 2018-01-27 LAB — POCT URINALYSIS DIPSTICK
Appearance: NORMAL
Bilirubin, UA: NEGATIVE
Blood, UA: NEGATIVE
Glucose, UA: NEGATIVE
KETONES UA: NEGATIVE
LEUKOCYTES UA: NEGATIVE
Nitrite, UA: NEGATIVE
ODOR: NORMAL
PH UA: 5.5 (ref 5.0–8.0)
PROTEIN UA: NEGATIVE
UROBILINOGEN UA: 0.2 U/dL

## 2018-01-27 NOTE — Progress Notes (Signed)
Has dysuria still and some frequency. UA normal. Patient aware

## 2018-02-09 ENCOUNTER — Ambulatory Visit (INDEPENDENT_AMBULATORY_CARE_PROVIDER_SITE_OTHER): Payer: Medicaid Other | Admitting: Psychiatry

## 2018-02-09 DIAGNOSIS — F4321 Adjustment disorder with depressed mood: Secondary | ICD-10-CM
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
## 2018-02-18 ENCOUNTER — Ambulatory Visit: Payer: Self-pay | Admitting: Family Medicine

## 2018-03-24 ENCOUNTER — Ambulatory Visit: Payer: Self-pay | Admitting: Family Medicine

## 2018-03-25 ENCOUNTER — Ambulatory Visit (HOSPITAL_COMMUNITY): Payer: Self-pay | Admitting: Psychiatry

## 2018-03-31 ENCOUNTER — Other Ambulatory Visit: Payer: Self-pay | Admitting: Family Medicine

## 2018-04-07 ENCOUNTER — Ambulatory Visit (HOSPITAL_COMMUNITY): Payer: Self-pay | Admitting: Psychiatry

## 2018-04-09 ENCOUNTER — Ambulatory Visit (HOSPITAL_COMMUNITY): Payer: Medicaid Other | Admitting: Psychiatry

## 2018-04-21 ENCOUNTER — Ambulatory Visit (HOSPITAL_COMMUNITY): Payer: Self-pay | Admitting: Psychiatry

## 2018-04-21 ENCOUNTER — Ambulatory Visit (HOSPITAL_COMMUNITY): Payer: Medicaid Other | Admitting: Psychiatry

## 2018-05-04 ENCOUNTER — Ambulatory Visit (INDEPENDENT_AMBULATORY_CARE_PROVIDER_SITE_OTHER): Payer: Medicaid Other | Admitting: Psychiatry

## 2018-05-04 ENCOUNTER — Encounter (HOSPITAL_COMMUNITY): Payer: Self-pay | Admitting: Psychiatry

## 2018-05-04 DIAGNOSIS — F4321 Adjustment disorder with depressed mood: Secondary | ICD-10-CM | POA: Diagnosis not present
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
## 2018-05-06 IMAGING — DX DG CHEST 2V
2 series · 2 of 2 positions shown · non-contrast
Comparison: Chest x-ray of September 28, 2015

CLINICAL DATA: Four days of productive cough and chills and
shortness of breath.

EXAM:
CHEST - 2 VIEW

[chest pa]
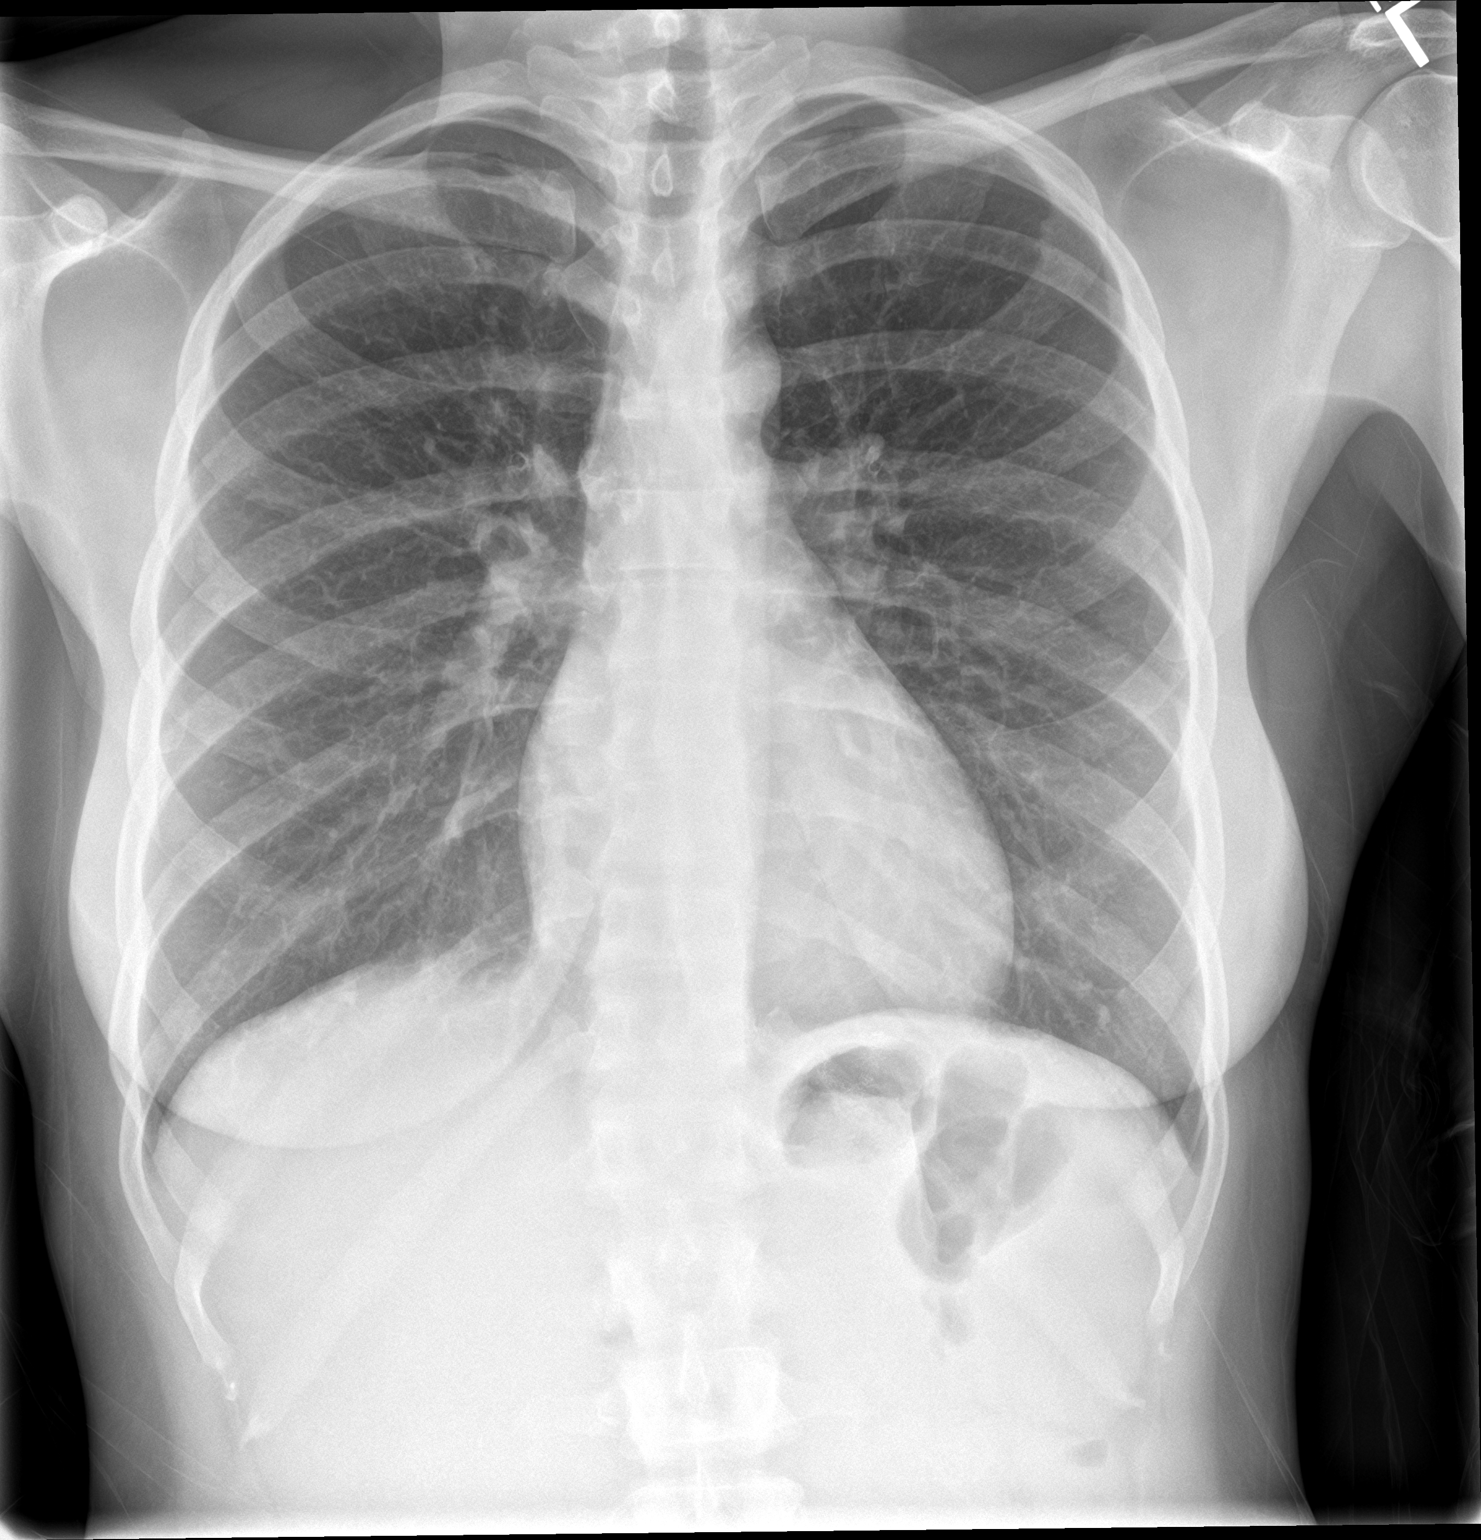

[chest lat]
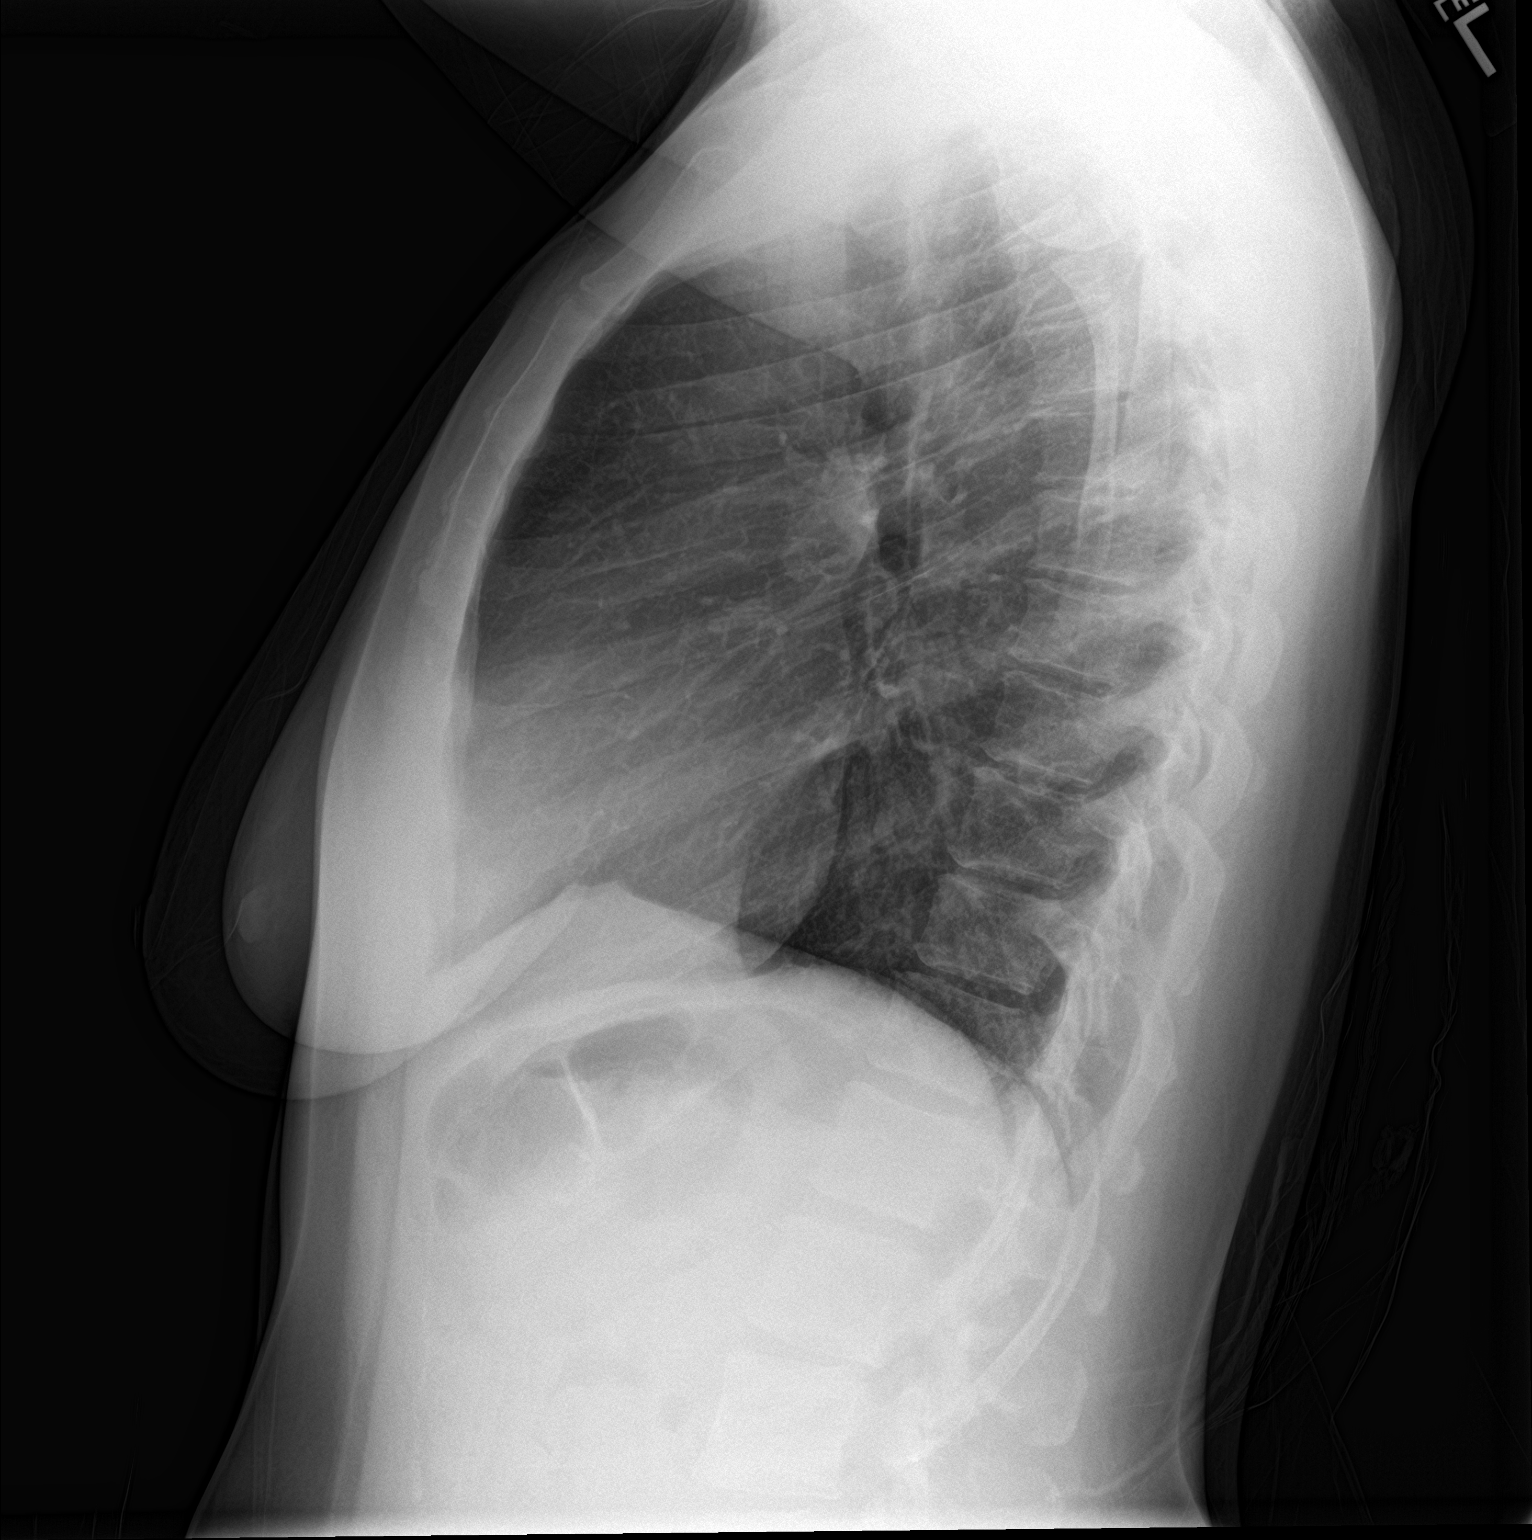

[2 of 2 positions shown; findings below may reference images not displayed]

FINDINGS: The lungs are well-expanded. There is no focal infiltrate. There is
no pleural effusion. The heart and pulmonary vascularity are normal.
The mediastinum is normal in width. The bony thorax is unremarkable.
IMPRESSION: No active cardiopulmonary disease.

## 2018-05-11 ENCOUNTER — Ambulatory Visit (HOSPITAL_COMMUNITY): Payer: Self-pay | Admitting: Psychiatry

## 2018-05-13 ENCOUNTER — Encounter: Payer: Self-pay | Admitting: Family Medicine

## 2018-05-13 ENCOUNTER — Ambulatory Visit: Payer: Medicaid Other | Admitting: Family Medicine

## 2018-05-13 VITALS — BP 118/80 | HR 69 | Resp 16 | Ht 67.0 in | Wt 185.0 lb

## 2018-05-13 DIAGNOSIS — F4321 Adjustment disorder with depressed mood: Secondary | ICD-10-CM

## 2018-05-13 DIAGNOSIS — Z13220 Encounter for screening for lipoid disorders: Secondary | ICD-10-CM | POA: Diagnosis not present

## 2018-05-13 DIAGNOSIS — R7301 Impaired fasting glucose: Secondary | ICD-10-CM | POA: Diagnosis not present

## 2018-05-13 DIAGNOSIS — J309 Allergic rhinitis, unspecified: Secondary | ICD-10-CM | POA: Diagnosis not present

## 2018-05-13 DIAGNOSIS — E559 Vitamin D deficiency, unspecified: Secondary | ICD-10-CM | POA: Diagnosis not present

## 2018-05-13 DIAGNOSIS — E663 Overweight: Secondary | ICD-10-CM | POA: Diagnosis not present

## 2018-05-13 MED ORDER — RANITIDINE HCL 300 MG PO TABS: ORAL_TABLET | ORAL | 0 refills | Status: DC

## 2018-05-13 NOTE — Patient Instructions (Addendum)
CPE and pap in 3 months, call if you need me sooner  CBC, lipid, chem 7 and vit D today  Stop vit D weekly when complete what you have, and you may take OTC once daily vitamin D3 1000 IU  Thank you  for choosing [facility]. We consider it a privelige to serve you.  Delivering excellent health care in a caring and  compassionate way is our goal.  Partnering with you,  so that together we can achieve this goal is our strategy.

## 2018-05-13 NOTE — Progress Notes (Signed)
   Rossie MuskratSonja L Wittmeyer     MRN: [id number]      DOB: 06/24/1981   HPI Ms. [name] is here for follow up and re-evaluation of chronic medical conditions, medication management and review of any available recent lab and radiology data.  Preventive health is updated, specifically  Cancer screening and Immunization.    The PT denies any adverse reactions to current medications since the last visit.  There are no new concerns.  There are no specific complaints   ROS Denies recent fever or chills. Denies sinus pressure, nasal congestion, ear pain or sore throat. Denies chest congestion, productive cough or wheezing. Denies chest pains, palpitations and leg swelling Denies abdominal pain, nausea, vomiting,diarrhea or constipation.   Denies dysuria, frequency, hesitancy or incontinence. Denies joint pain, swelling and limitation in mobility. Denies headaches, seizures, numbness, or tingling. Denies uncontrolled  depression, anxiety or insomnia. Denies skin break down or rash. Keeps active on her job and is careful about food choice, losing weight well. Follows with therapist and is working on self esteem, still trying to get over a broken 18 month relationship   PE BP 118/80   Pulse 69   Resp 16   Ht 5\' 7"  (1.702 m)   Wt 185 lb (83.9 kg)   SpO2 99%   BMI 28.98 kg/m     Patient alert and oriented and in no cardiopulmonary distress.  HEENT: No facial asymmetry, EOMI,   oropharynx pink and moist.  Neck supple no JVD, no mass.  Chest: Clear to auscultation bilaterally.  CVS: S1, S2 no murmurs, no S3.Regular rate.  ABD: Soft non tender.   Ext: No edema  MS: Adequate ROM spine, shoulders, hips and knees.  Skin: Intact, no ulcerations or rash noted.  Psych: Good eye contact, normal affect. Memory intact not anxious or depressed appearing.  CNS: CN 2-12 intact, power,  normal throughout.no focal deficits noted.   Assessment & Plan  Adjustment disorder with depressed mood Improved on  current dose of effexor and she will continue therapy, as she is improving with this also  Overweight (BMI 25.0-29.9) Improved, she is applauded on this. Patient re-educated about  the importance of commitment to a  minimum of 150 minutes of exercise per week.  The importance of healthy food choices with portion control discussed. Encouraged to start a food diary, count calories and to consider  joining a support group. Sample diet sheets offered. Goals set by the patient for the next several months.   Weight /BMI 05/13/2018 01/20/2018 12/21/2017  WEIGHT 185 lb 189 lb 194 lb 0.6 oz  HEIGHT 5\' 7"  5\' 6"  5\' 6"   BMI 28.98 kg/m2 30.51 kg/m2 31.32 kg/m2  Some encounter information is confidential and restricted. Go to Review Flowsheets activity to see all data.         Allergic rhinitis Controlled , no current flare  Vitamin D deficiency Updated lab needed at/ before next visit. Will take OTC supplement once complete weekly script

## 2018-05-13 NOTE — Assessment & Plan Note (Addendum)
Improved, she is applauded on this. Patient re-educated about  the importance of commitment to a  minimum of 150 minutes of exercise per week.  The importance of healthy food choices with portion control discussed. Encouraged to start a food diary, count calories and to consider  joining a support group. Sample diet sheets offered. Goals set by the patient for the next several months.   Weight /BMI 05/13/2018 01/20/2018 12/21/2017  WEIGHT 185 lb 189 lb 194 lb 0.6 oz  HEIGHT 5\' 7"  5\' 6"  5\' 6"   BMI 28.98 kg/m2 30.51 kg/m2 31.32 kg/m2  Some encounter information is confidential and restricted. Go to Review Flowsheets activity to see all data.

## 2018-05-13 NOTE — Assessment & Plan Note (Signed)
Controlled, no current flare 

## 2018-05-13 NOTE — Assessment & Plan Note (Signed)
Updated lab needed at/ before next visit. Will take OTC supplement once complete weekly script

## 2018-05-13 NOTE — Assessment & Plan Note (Signed)
Improved on current dose of effexor and she will continue therapy, as she is improving with this also

## 2018-05-24 ENCOUNTER — Ambulatory Visit (INDEPENDENT_AMBULATORY_CARE_PROVIDER_SITE_OTHER): Payer: Medicaid Other | Admitting: Psychiatry

## 2018-05-24 DIAGNOSIS — F4321 Adjustment disorder with depressed mood: Secondary | ICD-10-CM | POA: Diagnosis not present
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
## 2018-06-04 ENCOUNTER — Ambulatory Visit (HOSPITAL_COMMUNITY): Payer: Medicaid Other | Admitting: Psychiatry

## 2018-06-07 ENCOUNTER — Ambulatory Visit (INDEPENDENT_AMBULATORY_CARE_PROVIDER_SITE_OTHER): Payer: Medicaid Other | Admitting: Psychiatry

## 2018-06-07 DIAGNOSIS — F4321 Adjustment disorder with depressed mood: Secondary | ICD-10-CM
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
## 2018-06-25 ENCOUNTER — Ambulatory Visit (HOSPITAL_COMMUNITY): Payer: Self-pay | Admitting: Psychiatry

## 2018-07-01 ENCOUNTER — Ambulatory Visit (HOSPITAL_COMMUNITY): Payer: Self-pay | Admitting: Licensed Clinical Social Worker

## 2018-07-11 ENCOUNTER — Other Ambulatory Visit: Payer: Self-pay | Admitting: Family Medicine

## 2018-07-12 ENCOUNTER — Ambulatory Visit (HOSPITAL_COMMUNITY): Payer: Self-pay | Admitting: Psychiatry

## 2018-07-15 ENCOUNTER — Ambulatory Visit (HOSPITAL_COMMUNITY): Payer: Self-pay | Admitting: Licensed Clinical Social Worker

## 2018-07-29 ENCOUNTER — Ambulatory Visit (HOSPITAL_COMMUNITY): Payer: Self-pay | Admitting: Licensed Clinical Social Worker

## 2018-07-30 ENCOUNTER — Ambulatory Visit (HOSPITAL_COMMUNITY): Payer: Self-pay | Admitting: Psychiatry

## 2018-08-12 ENCOUNTER — Ambulatory Visit (HOSPITAL_COMMUNITY): Payer: Self-pay | Admitting: Psychiatry

## 2018-08-12 ENCOUNTER — Ambulatory Visit (HOSPITAL_COMMUNITY): Payer: Self-pay | Admitting: Licensed Clinical Social Worker

## 2018-08-19 ENCOUNTER — Ambulatory Visit (INDEPENDENT_AMBULATORY_CARE_PROVIDER_SITE_OTHER): Payer: Medicaid Other | Admitting: Family Medicine

## 2018-08-19 ENCOUNTER — Other Ambulatory Visit (HOSPITAL_COMMUNITY)
Admission: RE | Admit: 2018-08-19 | Discharge: 2018-08-19 | Disposition: A | Discharge status: Home / Self Care | Payer: Medicaid Other | Source: Ambulatory Visit | Attending: Family Medicine | Admitting: Family Medicine

## 2018-08-19 ENCOUNTER — Encounter: Payer: Self-pay | Admitting: Family Medicine

## 2018-08-19 VITALS — BP 122/80 | HR 54 | Resp 12 | Ht 69.0 in | Wt 200.1 lb

## 2018-08-19 DIAGNOSIS — F4321 Adjustment disorder with depressed mood: Secondary | ICD-10-CM | POA: Diagnosis not present

## 2018-08-19 DIAGNOSIS — Z23 Encounter for immunization: Secondary | ICD-10-CM | POA: Diagnosis not present

## 2018-08-19 DIAGNOSIS — N760 Acute vaginitis: Secondary | ICD-10-CM | POA: Diagnosis not present

## 2018-08-19 DIAGNOSIS — Z0000 Encounter for general adult medical examination without abnormal findings: Secondary | ICD-10-CM | POA: Diagnosis not present

## 2018-08-19 DIAGNOSIS — E663 Overweight: Secondary | ICD-10-CM

## 2018-08-19 MED ORDER — NORGESTIM-ETH ESTRAD TRIPHASIC 0.18/0.215/0.25 MG-25 MCG PO TABS: 1.0000 | ORAL_TABLET | Freq: Every day | ORAL | 3 refills | Status: DC

## 2018-08-19 MED ORDER — VENLAFAXINE HCL ER 37.5 MG PO CP24: 37.5000 mg | ORAL_CAPSULE | Freq: Every day | ORAL | 6 refills | Status: DC

## 2018-08-19 NOTE — Assessment & Plan Note (Signed)
Needs to resume anti depressant

## 2018-08-19 NOTE — Progress Notes (Signed)
Stacey Palmer     MRN: [id number]      DOB: 11/09/1980  HPI: Patient is in for annual physical exam. C/o pelvic discharge and discomfort  Immunization is reviewed , and flu vaccine given C/o fatigue, feels overwhelmed by weight regain and reduced work hours requests 2 day work excuse Needs to resume antidepressant  PE: BP 122/80 (BP Location: Right Arm, Patient Position: Sitting, Cuff Size: Large)   Pulse (!) 54   Resp 12   Ht 5\' 9"  (1.753 m)   Wt 200 lb 1.9 oz (90.8 kg)   SpO2 100% Comment: room air  BMI 29.55 kg/m   Pleasant  female, alert and oriented x 3, in no cardio-pulmonary distress. Afebrile. HEENT No facial trauma or asymetry. Sinuses non tender.  Extra occullar muscles intact, pupils equally reactive to light. External ears normal, tympanic membranes clear. Oropharynx moist, no exudate. Neck: supple, no adenopathy,JVD or thyromegaly.No bruits.  Chest: Clear to ascultation bilaterally.No crackles or wheezes. Non tender to palpation  Breast: No asymetry,no masses or lumps. No tenderness. No nipple discharge or inversion. No axillary or supraclavicular adenopathy  Cardiovascular system; Heart sounds normal,  S1 and  S2 ,no S3.  No murmur, or thrill. Apical beat not displaced Peripheral pulses normal.  Abdomen: Soft, non tender, no organomegaly or masses. No bruits. Bowel sounds normal. No guarding, tenderness or rebound.    GU: External genitalia normal female genitalia , normal female distribution of hair. No lesions. Urethral meatus normal in size, no  Prolapse, no lesions visibly  Present. Bladder non tender. Vagina pink and moist , with no visible lesions , fishy smelling discharge present . Adequate pelvic support no  cystocele or rectocele noted Cervix pink and appears healthy, no lesions or ulcerations noted,fishy discharge noted from os Uterus normal size, no adnexal masses, no cervical motion or adnexal tenderness.   Musculoskeletal  exam: Full ROM of spine, hips , shoulders and knees. No deformity ,swelling or crepitus noted. No muscle wasting or atrophy.   Neurologic: Cranial nerves 2 to 12 intact. Power, tone ,sensation and reflexes normal throughout. No disturbance in gait. No tremor.  Skin: Intact, no ulceration, erythema , scaling or rash noted. Pigmentation normal throughout  Psych; Mildly depressed  mood and sad affect. Judgement and concentration normal   Assessment & Plan:  Encounter for annual physical exam Annual exam as documented. Counseling done  re healthy lifestyle involving commitment to 150 minutes exercise per week, heart healthy diet, and attaining healthy weight.The importance of adequate sleep also discussed. Regular seat belt use and home safety, is also discussed. Changes in health habits are decided on by the patient with goals and time frames  set for achieving them. Immunization and cancer screening needs are specifically addressed at this visit.   Adjustment disorder with depressed mood Needs to resume anti depressant  Vaginitis and vulvovaginitis Wet prep and culture and pap, symptomatic with abnormal exam, specimens sent  Overweight (BMI 25.0-29.9) Deteriorated. Patient re-educated about  the importance of commitment to a  minimum of 150 minutes of exercise per week.  The importance of healthy food choices with portion control discussed. Encouraged to start a food diary, count calories and to consider  joining a support group. Sample diet sheets offered. Goals set by the patient for the next several months.   Weight /BMI 08/19/2018 05/13/2018 01/20/2018  WEIGHT 200 lb 1.9 oz 185 lb 189 lb  HEIGHT 5\' 9"  5\' 7"  5\' 6"   BMI 29.55 kg/m2  28.98 kg/m2 30.51 kg/m2  Some encounter information is confidential and restricted. Go to Review Flowsheets activity to see all data.

## 2018-08-19 NOTE — Assessment & Plan Note (Addendum)
Wet prep and culture and pap, symptomatic with abnormal exam, specimens sent

## 2018-08-19 NOTE — Patient Instructions (Addendum)
F/U in 6 month, call if you need me before   Labs today CBC, fasting lipid, cmp and EGFR, vit D ad TSH  Work excuse from today 10/17 to return 08/21/2018  Resume effexor XR  37.5 mg one daily    Specimens sent for STD testing today and also  pap  Weight loss goal of 15 pounds  It is important that you exercise regularly at least 30 minutes 5 times a week. If you develop chest pain, have severe difficulty breathing, or feel very tired, stop exercising immediately and seek medical attention    Please work on good  health habits so that your health will improve. 1. Commitment to daily physical activity for 30 to 60  minutes, if you are able to do this.  2. Commitment to wise food choices. Aim for half of your  food intake to be vegetable and fruit, one quarter starchy foods, and one quarter protein. Try to eat on a regular schedule  3 meals per day, snacking between meals should be limited to vegetables or fruits or small portions of nuts. 64 ounces of water per day is generally recommended, unless you have specific health conditions, like heart failure or kidney failure where you will need to limit fluid intake.  3. Commitment to sufficient and a  good quality of physical and mental rest daily, generally between 6 to 8 hours per day.  WITH PERSISTANCE AND PERSEVERANCE, THE IMPOSSIBLE , BECOMES THE NORM! Thank you  for choosing [facility]. We consider it a privelige to serve you.  Delivering excellent health care in a caring and  compassionate way is our goal.  Partnering with you,  so that together we can achieve this goal is our strategy.

## 2018-08-19 NOTE — Assessment & Plan Note (Signed)

## 2018-08-20 ENCOUNTER — Encounter: Payer: Self-pay | Admitting: Family Medicine

## 2018-08-20 LAB — COMPLETE METABOLIC PANEL WITH GFR
AG Ratio: 1.3 (calc) (ref 1.0–2.5)
ALKALINE PHOSPHATASE (APISO): 44 U/L (ref 33–115)
ALT: 7 U/L (ref 6–29)
AST: 13 U/L (ref 10–30)
Albumin: 4.1 g/dL (ref 3.6–5.1)
BUN: 12 mg/dL (ref 7–25)
CALCIUM: 9.1 mg/dL (ref 8.6–10.2)
CO2: 27 mmol/L (ref 20–32)
CREATININE: 0.6 mg/dL (ref 0.50–1.10)
Chloride: 101 mmol/L (ref 98–110)
GFR, EST NON AFRICAN AMERICAN: 115 mL/min/{1.73_m2} (ref >=60)
GFR, Est African American: 133 mL/min/{1.73_m2} (ref >=60)
GLOBULIN: 3.1 g/dL (ref 1.9–3.7)
GLUCOSE: 80 mg/dL (ref 65–99)
Potassium: 4 mmol/L (ref 3.5–5.3)
SODIUM: 136 mmol/L (ref 135–146)
Total Bilirubin: 0.8 mg/dL (ref 0.2–1.2)
Total Protein: 7.2 g/dL (ref 6.1–8.1)

## 2018-08-20 LAB — LIPID PANEL
CHOLESTEROL: 180 mg/dL (ref <200)
HDL: 83 mg/dL (ref >50)
LDL Cholesterol (Calc): 84 mg/dL (calc)
Non-HDL Cholesterol (Calc): 97 mg/dL (calc) (ref <130)
Total CHOL/HDL Ratio: 2.2 (calc) (ref <5.0)
Triglycerides: 53 mg/dL (ref <150)

## 2018-08-20 LAB — CBC
HCT: 38.4 % (ref 35.0–45.0)
Hemoglobin: 13.1 g/dL (ref 11.7–15.5)
MCH: 32.3 pg (ref 27.0–33.0)
MCHC: 34.1 g/dL (ref 32.0–36.0)
MCV: 94.8 fL (ref 80.0–100.0)
MPV: 9.8 fL (ref 7.5–12.5)
Platelets: 301 10*3/uL (ref 140–400)
RBC: 4.05 10*6/uL (ref 3.80–5.10)
RDW: 11.4 % (ref 11.0–15.0)
WBC: 3.8 10*3/uL (ref 3.8–10.8)

## 2018-08-20 LAB — VITAMIN D 25 HYDROXY (VIT D DEFICIENCY, FRACTURES): Vit D, 25-Hydroxy: 28 ng/mL — ABNORMAL LOW (ref 30–100)

## 2018-08-20 LAB — TSH: TSH: 1.36 m[IU]/L

## 2018-08-22 ENCOUNTER — Encounter: Payer: Self-pay | Admitting: Family Medicine

## 2018-08-22 NOTE — Assessment & Plan Note (Signed)
Deteriorated. Patient re-educated about  the importance of commitment to a  minimum of 150 minutes of exercise per week.  The importance of healthy food choices with portion control discussed. Encouraged to start a food diary, count calories and to consider  joining a support group. Sample diet sheets offered. Goals set by the patient for the next several months.   Weight /BMI 08/19/2018 05/13/2018 01/20/2018  WEIGHT 200 lb 1.9 oz 185 lb 189 lb  HEIGHT 5\' 9"  5\' 7"  5\' 6"   BMI 29.55 kg/m2 28.98 kg/m2 30.51 kg/m2  Some encounter information is confidential and restricted. Go to Review Flowsheets activity to see all data.

## 2018-08-23 LAB — CYTOLOGY - PAP
BACTERIAL VAGINITIS: NEGATIVE
CANDIDA VAGINITIS: NEGATIVE
Chlamydia: NEGATIVE
Diagnosis: NEGATIVE
NEISSERIA GONORRHEA: NEGATIVE
TRICH (WINDOWPATH): NEGATIVE

## 2018-08-26 ENCOUNTER — Ambulatory Visit (HOSPITAL_COMMUNITY): Payer: Self-pay | Admitting: Licensed Clinical Social Worker

## 2018-08-26 ENCOUNTER — Encounter: Payer: Self-pay | Admitting: Family Medicine

## 2018-08-26 ENCOUNTER — Ambulatory Visit (INDEPENDENT_AMBULATORY_CARE_PROVIDER_SITE_OTHER): Payer: Medicaid Other | Admitting: Family Medicine

## 2018-08-26 ENCOUNTER — Ambulatory Visit (HOSPITAL_COMMUNITY): Payer: Self-pay | Admitting: Psychiatry

## 2018-08-26 VITALS — BP 130/84 | HR 70 | Resp 12 | Ht 69.0 in | Wt 199.0 lb

## 2018-08-26 DIAGNOSIS — J0110 Acute frontal sinusitis, unspecified: Secondary | ICD-10-CM

## 2018-08-26 DIAGNOSIS — F4321 Adjustment disorder with depressed mood: Secondary | ICD-10-CM

## 2018-08-26 DIAGNOSIS — J209 Acute bronchitis, unspecified: Secondary | ICD-10-CM

## 2018-08-26 MED ORDER — AZITHROMYCIN 250 MG PO TABS: ORAL_TABLET | ORAL | 0 refills | Status: DC

## 2018-08-26 MED ORDER — BENZONATATE 100 MG PO CAPS: 100.0000 mg | ORAL_CAPSULE | Freq: Two times a day (BID) | ORAL | 0 refills | Status: DC | PRN

## 2018-08-26 NOTE — Progress Notes (Signed)
   Stacey Palmer     MRN: [id number]      DOB: 10/07/1981   HPI Stacey Palmer is here with a 1 day h/o body aches, chills , sore  Throat, cough and chest congestion , chills, , clear and yellow nasal drainage, cough productive of yellow sputum  ROS Denies chest pains, palpitations and leg swelling Denies abdominal pain, nausea, vomiting,diarrhea or constipation.   Denies dysuria, frequency, hesitancy or incontinence. Denies joint pain, swelling and limitation in mobility. Denies headaches, seizures, numbness, or tingling. Denies depression, anxiety or insomnia. Denies skin break down or rash.   PE  BP 130/84   Pulse 70   Resp 12   Ht 5\' 9"  (1.753 m)   Wt 199 lb 0.6 oz (90.3 kg)   SpO2 99%   BMI 29.39 kg/m   Patient alert and oriented and in no cardiopulmonary distress.  HEENT: No facial asymmetry, EOMI,   oropharynx pink and moist.  Neck supple no JVD, no mass.frontal  sinus tenderness, TM clear  Chest: Adequate air entry, scattered crackles, no wheezes  CVS: S1, S2 no murmurs, no S3.Regular rate.  ABD: Soft non tender.   Ext: No edema  MS: Adequate ROM spine, shoulders, hips and knees.  Skin: Intact, no ulcerations or rash noted.  Psych: Good eye contact, flat  affect. Memory intact mildly  anxious and  depressed appearing.  CNS: CN 2-12 intact, power,  normal throughout.no focal deficits noted.   Assessment & Plan  Acute frontal sinusitis Antibiotic prescribed, saline flushes recommended, work excuse granted  Acute bronchitis Antibiotic, decongestant and rest  Adjustment disorder with depressed mood Not suicidal or homicidal, but uncontrolled, multiple and excessive somatic complaints, distressed about weight regain

## 2018-08-26 NOTE — Patient Instructions (Signed)
F/u as before, call iof you need me sooner  Medications are sent to the office for frontal sinsusitis and bronchitis.  Work excuse today return on 08/29/2018

## 2018-08-28 ENCOUNTER — Encounter: Payer: Self-pay | Admitting: Family Medicine

## 2018-08-28 DIAGNOSIS — J0110 Acute frontal sinusitis, unspecified: Secondary | ICD-10-CM | POA: Insufficient documentation

## 2018-08-28 NOTE — Assessment & Plan Note (Signed)
Antibiotic, decongestant and rest

## 2018-08-28 NOTE — Assessment & Plan Note (Signed)
Antibiotic prescribed, saline flushes recommended, work excuse granted

## 2018-08-28 NOTE — Assessment & Plan Note (Signed)
Not suicidal or homicidal, but uncontrolled, multiple and excessive somatic complaints, distressed about weight regain

## 2018-08-31 ENCOUNTER — Encounter (HOSPITAL_COMMUNITY): Payer: Self-pay | Admitting: Emergency Medicine

## 2018-08-31 ENCOUNTER — Other Ambulatory Visit: Payer: Self-pay

## 2018-08-31 ENCOUNTER — Emergency Department (HOSPITAL_COMMUNITY)
Admission: EM | Admit: 2018-08-31 | Discharge: 2018-08-31 | Disposition: A | Discharge status: Home / Self Care | Payer: Medicaid Other | Attending: Emergency Medicine | Admitting: Emergency Medicine

## 2018-08-31 ENCOUNTER — Telehealth: Payer: Self-pay | Admitting: Family Medicine

## 2018-08-31 ENCOUNTER — Emergency Department (HOSPITAL_COMMUNITY): Payer: Medicaid Other

## 2018-08-31 DIAGNOSIS — J40 Bronchitis, not specified as acute or chronic: Secondary | ICD-10-CM | POA: Diagnosis not present

## 2018-08-31 DIAGNOSIS — Z79899 Other long term (current) drug therapy: Secondary | ICD-10-CM | POA: Insufficient documentation

## 2018-08-31 DIAGNOSIS — R05 Cough: Secondary | ICD-10-CM | POA: Diagnosis present

## 2018-08-31 DIAGNOSIS — J069 Acute upper respiratory infection, unspecified: Secondary | ICD-10-CM | POA: Diagnosis not present

## 2018-08-31 MED ORDER — ALBUTEROL SULFATE HFA 108 (90 BASE) MCG/ACT IN AERS
2.0000 | INHALATION_SPRAY | Freq: Once | RESPIRATORY_TRACT | Status: AC
  Administered 2018-08-31: 2 via RESPIRATORY_TRACT
  Filled 2018-08-31: qty 6.7

## 2018-08-31 NOTE — Telephone Encounter (Signed)
Pt came in to get the work note extended, she was off on Monday 08-30-18, and went in on 08-31-18 per the note, and work sent her home as she did not look good. I reached out to [place], as she needs the work note extended.

## 2018-08-31 NOTE — Discharge Instructions (Addendum)
Your lungs are clear at this time.  Your vital signs are well within normal limits.  Your chest x-ray is negative for any acute changes or problems.  Your oxygen level is 100% on room air.  I have ordered an albuterol inhaler to use in the event that the wheezing sensation should return.  Please finish your cough medication and your steroid medication as ordered by Dr.Simpson.  Please increase fluids.  If your symptoms continue, please call Dr. Lodema Hong for an office appointment and reevaluation.

## 2018-08-31 NOTE — Telephone Encounter (Signed)
I recommend since she is niot responding to std treatment and feeling so ill to go to the eD today for evaluation and management, no work extension  At this time, needs treatment that will help

## 2018-08-31 NOTE — ED Notes (Signed)
ED Provider at bedside. 

## 2018-08-31 NOTE — Telephone Encounter (Signed)
Pt is made aware. 

## 2018-08-31 NOTE — ED Triage Notes (Signed)
Patient complaining of cough since last week. States she saw PCP last week and was given z-pac, cough medicine, and steroids but is no better.

## 2018-08-31 NOTE — ED Provider Notes (Signed)
[facility] EMERGENCY DEPARTMENT Provider Note   CSN: [id number] Arrival date & time: 08/31/18  1723     History   Chief Complaint Chief Complaint  Patient presents with  . Cough    HPI Stacey Palmer is a 37 y.o. female.  Patient is a 37 year old female who presents to the emergency department with a complaint of cough.  The patient states this problem is been going on for over a week.  The cough is been productive off and on.  The patient states she has been seen by her primary physician on last week was treated with Z-Pak, cough medicine, and steroids, but she still does not feel as though she is any better.  She has not had any high fever to be reported.  No hemoptysis noted.  There is no reported history of lung related medical problems.  The history is provided by the patient.  Cough  Pertinent negatives include no chest pain, no shortness of breath and no wheezing.    Past Medical History:  Diagnosis Date  . Allergy   . Anxiety   . Obesity   . Sleep apnea     Patient Active Problem List   Diagnosis Date Noted  . Acute frontal sinusitis 08/28/2018  . Adjustment disorder with depressed mood 01/27/2017  . Impaired fasting glucose 10/21/2016  . Vitamin D deficiency 10/21/2016  . Bariatric surgery status 07/11/2016  . Encounter for annual physical exam 05/09/2016  . Overweight (BMI 25.0-29.9) 06/11/2015  . 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 Vaginitis and vulvovaginitis 06/05/2014  . Allergic rhinitis 06/05/2014  . Acute bronchitis 01/22/2012  . FATIGUE 04/04/2010    Past Surgical History:  Procedure Laterality Date  . CESAREAN SECTION    . TUBAL LIGATION       OB History   None      Home Medications    Prior to Admission medications   Medication Sig Start Date End Date Taking? Authorizing Provider  acyclovir (ZOVIRAX) 400 MG tablet TAKE 1 TABLET BY MOUTH THREE TIMES DAILY 07/12/18   Kerri Perches, MD  azithromycin [facility]) 250 MG  tablet Two tablets on day one, then one tablet once daily for an additional four days 08/26/18   Kerri Perches, MD  benzonatate (TESSALON) 100 MG capsule Take 1 capsule (100 mg total) by mouth 2 (two) times daily as needed for cough. 08/26/18   Kerri Perches, MD  fluticasone (FLONASE) 50 MCG/ACT nasal spray Place 2 sprays into both nostrils daily. 07/11/16   Eustace Moore, MD  montelukast (SINGULAIR) 10 MG tablet Take 1 tablet (10 mg total) by mouth at bedtime. 11/19/17   Kerri Perches, MD  Norgestimate-Ethinyl Estradiol Triphasic 0.18/0.215/0.25 MG-25 MCG tab Take 1 tablet by mouth daily. 08/19/18   Kerri Perches, MD  ranitidine (ZANTAC) 300 MG tablet One tablet once daily as needed, for hearrtburn 05/13/18   Kerri Perches, MD  UNABLE TO FIND HB Otic Use 5 drops in affected ear three times daily as needed 12/15/17   Kerri Perches, MD  venlafaxine XR (EFFEXOR-XR) 37.5 MG 24 hr capsule Take 1 capsule (37.5 mg total) by mouth daily with breakfast. 08/19/18   Kerri Perches, MD    Family History Family History  Problem Relation Age of Onset  . Hypertension Mother   . Kidney disease Father   . Diabetes Father     Social History Social History   Tobacco Use  . Smoking status: Never  Smoker  . Smokeless tobacco: Never Used  Substance Use Topics  . Alcohol use: No    Comment: 01-27-2017 per pt occa. Wine  . Drug use: No    Comment: 01-27-2017 per pt no     Allergies   Shellfish allergy   Review of Systems Review of Systems  Constitutional: Negative for activity change and fever.       All ROS Neg except as noted in HPI  HENT: Positive for congestion. Negative for nosebleeds.   Eyes: Negative for photophobia and discharge.  Respiratory: Positive for cough. Negative for shortness of breath and wheezing.   Cardiovascular: Negative for chest pain and palpitations.  Gastrointestinal: Negative for abdominal pain and blood in stool.    Genitourinary: Negative for dysuria, frequency and hematuria.  Musculoskeletal: Negative for arthralgias, back pain and neck pain.  Skin: Negative.   Neurological: Negative for dizziness, seizures and speech difficulty.  Psychiatric/Behavioral: Negative for confusion and hallucinations.     Physical Exam Updated Vital Signs BP 138/88 (BP Location: Right Arm)   Pulse 68   Temp 98.1 F (36.7 C) (Oral)   Resp 18   Ht 5\' 8"  (1.727 m)   Wt 85.7 kg   LMP 08/10/2018   SpO2 100%   BMI 28.74 kg/m   Physical Exam  Constitutional: She is oriented to person, place, and time. She appears well-developed and well-nourished.  Non-toxic appearance.  HENT:  Head: Normocephalic.  Right Ear: Tympanic membrane and external ear normal.  Left Ear: Tympanic membrane and external ear normal.  Mild to mod nasal congestion.  Eyes: Pupils are equal, round, and reactive to light. EOM and lids are normal.  Neck: Normal range of motion. Neck supple. Carotid bruit is not present.  Cardiovascular: Normal rate, regular rhythm, normal heart sounds, intact distal pulses and normal pulses.  Pulmonary/Chest: Breath sounds normal. No respiratory distress.  Abdominal: Soft. Bowel sounds are normal. There is no tenderness. There is no guarding.  Musculoskeletal: Normal range of motion.  Neg Homan's sign.  Lymphadenopathy:       Head (right side): No submandibular adenopathy present.       Head (left side): No submandibular adenopathy present.    She has no cervical adenopathy.  Neurological: She is alert and oriented to person, place, and time. She has normal strength. No cranial nerve deficit or sensory deficit.  Skin: Skin is warm and dry.  Psychiatric: She has a normal mood and affect. Her speech is normal.  Nursing note and vitals reviewed.    ED Treatments / Results  Labs (all labs ordered are listed, but only abnormal results are displayed) Labs Reviewed - No data to  display  EKG None  Radiology Dg Chest 2 View  Result Date: 08/31/2018 CLINICAL DATA:  Upper chest pain and productive cough EXAM: CHEST - 2 VIEW COMPARISON:  January 20, 2018 FINDINGS: The heart size and mediastinal contours are within normal limits. Both lungs are clear. The visualized skeletal structures are unremarkable. IMPRESSION: No active cardiopulmonary disease. Electronically Signed   By: Sherian Rein M.D.   On: 08/31/2018 18:48    Procedures Procedures (including critical care time)  Medications Ordered in ED Medications - No data to display   Initial Impression / Assessment and Plan / ED Course  I have reviewed the triage vital signs and the nursing notes.  Pertinent labs & imaging results that were available during my care of the patient were reviewed by me and considered in my medical decision  making (see chart for details).       Final Clinical Impressions(s) / ED Diagnoses MDM  Vital signs are within normal limits.  Pulse oximetry is 100% on room air.  Within normal limits by my interpretation.  Patient speaks in complete sentences.  There is no excessive coughing during the examination today.  Capillary refill is less than 2 seconds.  No hemoptysis reported.  I reassured the patient of the vital signs, the pulse oximetry, and the examination findings.  The patient is finished Zithromax Z-PAK.  I encouraged her to continue her cough medications and finish her steroid medications.  Patient states that she felt as though she was wheezing earlier.  Will suggest an albuterol inhaler for difficulty with wheezing, or breathing.  I have asked the patient to follow-up with Dr. Lodema Hong in the office if these symptoms continue.   Final diagnoses:  Bronchitis  Upper respiratory tract infection, unspecified type    ED Discharge Orders    None       Ivery Quale, PA-C 09/01/18 1234    Jacalyn Lefevre, MD 09/02/18 1521

## 2018-08-31 NOTE — Telephone Encounter (Signed)
Pt c/o SOB and trouble getting her breath and still coughing up brownish mucus and feverish lastnight and she is finished with her med

## 2018-09-01 ENCOUNTER — Telehealth: Payer: Self-pay | Admitting: Family Medicine

## 2018-09-01 NOTE — Telephone Encounter (Signed)
[name]   Copied Noted Sleeved  

## 2018-09-13 ENCOUNTER — Telehealth: Payer: Self-pay | Admitting: Family Medicine

## 2018-09-13 NOTE — Telephone Encounter (Signed)
Stacey Palmer form is completed and returned to you

## 2018-09-15 DIAGNOSIS — J209 Acute bronchitis, unspecified: Secondary | ICD-10-CM

## 2018-09-15 DIAGNOSIS — J0110 Acute frontal sinusitis, unspecified: Secondary | ICD-10-CM

## 2018-09-15 DIAGNOSIS — F4321 Adjustment disorder with depressed mood: Secondary | ICD-10-CM

## 2018-11-02 ENCOUNTER — Encounter: Payer: Self-pay | Admitting: *Deleted

## 2018-11-11 ENCOUNTER — Ambulatory Visit (INDEPENDENT_AMBULATORY_CARE_PROVIDER_SITE_OTHER): Payer: Self-pay | Admitting: Family Medicine

## 2018-11-11 ENCOUNTER — Encounter: Payer: Self-pay | Admitting: Family Medicine

## 2018-11-11 VITALS — BP 118/86 | HR 88 | Resp 12 | Ht 69.0 in | Wt 207.1 lb

## 2018-11-11 DIAGNOSIS — E669 Obesity, unspecified: Secondary | ICD-10-CM

## 2018-11-11 DIAGNOSIS — J309 Allergic rhinitis, unspecified: Secondary | ICD-10-CM

## 2018-11-11 DIAGNOSIS — F4321 Adjustment disorder with depressed mood: Secondary | ICD-10-CM

## 2018-11-11 DIAGNOSIS — M545 Low back pain, unspecified: Secondary | ICD-10-CM

## 2018-11-11 DIAGNOSIS — R350 Frequency of micturition: Secondary | ICD-10-CM

## 2018-11-11 DIAGNOSIS — E663 Overweight: Secondary | ICD-10-CM

## 2018-11-11 LAB — POCT URINALYSIS DIP (CLINITEK)
BILIRUBIN UA: NEGATIVE
Blood, UA: NEGATIVE
Glucose, UA: NEGATIVE mg/dL
Ketones, POC UA: NEGATIVE mg/dL
Leukocytes, UA: NEGATIVE
Nitrite, UA: NEGATIVE
POC PROTEIN,UA: NEGATIVE
Spec Grav, UA: 1.025 (ref 1.010–1.025)
Urobilinogen, UA: 0.2 E.U./dL
pH, UA: 5.5 (ref 5.0–8.0)

## 2018-11-11 MED ORDER — RANITIDINE HCL 300 MG PO TABS: ORAL_TABLET | ORAL | 6 refills | Status: DC

## 2018-11-11 MED ORDER — ACYCLOVIR 400 MG PO TABS: 400.0000 mg | ORAL_TABLET | Freq: Three times a day (TID) | ORAL | 0 refills | Status: DC

## 2018-11-11 MED ORDER — MONTELUKAST SODIUM 10 MG PO TABS: 10.0000 mg | ORAL_TABLET | Freq: Every day | ORAL | 3 refills | Status: DC

## 2018-11-11 MED ORDER — NORGESTIM-ETH ESTRAD TRIPHASIC 0.18/0.215/0.25 MG-25 MCG PO TABS: 1.0000 | ORAL_TABLET | Freq: Every day | ORAL | 3 refills | Status: DC

## 2018-11-11 MED ORDER — VENLAFAXINE HCL ER 37.5 MG PO CP24: 37.5000 mg | ORAL_CAPSULE | Freq: Every day | ORAL | 6 refills | Status: DC

## 2018-11-11 MED ORDER — IBUPROFEN 800 MG PO TABS: 800.0000 mg | ORAL_TABLET | Freq: Three times a day (TID) | ORAL | 0 refills | Status: DC | PRN

## 2018-11-11 NOTE — Patient Instructions (Signed)
F/U as before call if you need me sooner  You do not have a bladder infection,, you have back pain and congestion and some of this is related to the fact that your period is about to start

## 2018-11-11 NOTE — Progress Notes (Signed)
   Stacey Palmer     MRN: [id number]      DOB: 08/21/1981   HPI Stacey Palmer is here with a 2 day h/o urinary frequency and low back pain, also here to get medication sent in and requests generic where possible as currently uninsured She continues to work on weight loss , as she has started regaining weight since her surgery and attributes this mainly to poor food choice driven by stress  ROS Denies recent fever or chills. Denies sinus pressure, nasal congestion, ear pain or sore throat. Denies chest congestion, productive cough or wheezing. Denies chest pains, palpitations and leg swelling Denies abdominal pain, nausea, vomiting,diarrhea or constipation.    Denies headaches, seizures, numbness, or tingling.  Denies skin break down or rash.   PE  BP 118/86 (BP Location: Right Arm, Patient Position: Sitting, Cuff Size: Large)   Pulse 88   Resp 12   Ht 5\' 9"  (1.753 m)   Wt 207 lb 1.9 oz (93.9 kg)   SpO2 95% Comment: room air  BMI 30.59 kg/m   Patient alert and oriented and in no cardiopulmonary distress.  HEENT: No facial asymmetry, EOMI,   oropharynx pink and moist.  Neck supple no JVD, no mass.  Chest: Clear to auscultation bilaterally.  CVS: S1, S2 no murmurs, no S3.Regular rate.  ABD: Soft no renal angle tenderness ,mild suprapubic tenderness  Ext: No edema  MS: Adequate though reduced ROM spine,normal in  shoulders, hips and knees.  Skin: Intact, no ulcerations or rash noted.  Psych: Good eye contact, normal affect. Memory intact not anxious or depressed appearing.  CNS: CN 2-12 intact, power,  normal throughout.no focal deficits noted.   Assessment & Plan  Low back pain 2 day h/o low back pain and frequency, advised tylenol as needed for pain  Urinary frequency Mildly symptomatic with normal CCUA, she is reassured no infection and advised to ensure adequate water intake minimum of 64 oz daily  Allergic rhinitis Use medication as needed , for  Symptom  control  Adjustment disorder with depressed mood She is to continue her chronic medication  Obesity (BMI 30.0-34.9) Deteriorated. Patient re-educated about  the importance of commitment to a  minimum of 150 minutes of exercise per week.  The importance of healthy food choices with portion control discussed. Encouraged to start a food diary, count calories and to consider  joining a support group. Sample diet sheets offered. Goals set by the patient for the next several months.   Weight /BMI 11/11/2018 08/31/2018 08/26/2018  WEIGHT 207 lb 1.9 oz 189 lb 199 lb 0.6 oz  HEIGHT 5\' 9"  5\' 8"  5\' 9"   BMI 30.59 kg/m2 28.74 kg/m2 29.39 kg/m2  Some encounter information is confidential and restricted. Go to Review Flowsheets activity to see all data.

## 2018-11-11 NOTE — Assessment & Plan Note (Addendum)
2 day h/o low back pain and frequency, advised tylenol as needed for pain

## 2018-11-15 ENCOUNTER — Other Ambulatory Visit: Payer: Self-pay | Admitting: Family Medicine

## 2018-11-15 MED ORDER — FAMOTIDINE 20 MG PO TABS: 20.0000 mg | ORAL_TABLET | Freq: Every day | ORAL | 5 refills | Status: DC

## 2018-11-20 ENCOUNTER — Encounter: Payer: Self-pay | Admitting: Family Medicine

## 2018-11-20 DIAGNOSIS — R350 Frequency of micturition: Secondary | ICD-10-CM | POA: Insufficient documentation

## 2018-11-20 NOTE — Assessment & Plan Note (Signed)
She is to continue her chronic medication

## 2018-11-20 NOTE — Assessment & Plan Note (Signed)
Deteriorated. Patient re-educated about  the importance of commitment to a  minimum of 150 minutes of exercise per week.  The importance of healthy food choices with portion control discussed. Encouraged to start a food diary, count calories and to consider  joining a support group. Sample diet sheets offered. Goals set by the patient for the next several months.   Weight /BMI 11/11/2018 08/31/2018 08/26/2018  WEIGHT 207 lb 1.9 oz 189 lb 199 lb 0.6 oz  HEIGHT 5\' 9"  5\' 8"  5\' 9"   BMI 30.59 kg/m2 28.74 kg/m2 29.39 kg/m2  Some encounter information is confidential and restricted. Go to Review Flowsheets activity to see all data.

## 2018-11-20 NOTE — Assessment & Plan Note (Signed)
Use medication as needed , for  Symptom control

## 2018-11-20 NOTE — Assessment & Plan Note (Signed)
Mildly symptomatic with normal CCUA, she is reassured no infection and advised to ensure adequate water intake minimum of 64 oz daily

## 2018-12-21 ENCOUNTER — Telehealth: Payer: Self-pay | Admitting: Family Medicine

## 2018-12-21 NOTE — Telephone Encounter (Signed)
Made patient an appointment for 9:40 tomorrow 12-22-18

## 2018-12-21 NOTE — Telephone Encounter (Signed)
Please call the patient regarding being taken out of work, she was on the phone with someone else, so I really didn't get exactly what she wanted,

## 2018-12-22 ENCOUNTER — Telehealth: Payer: Self-pay

## 2018-12-22 ENCOUNTER — Ambulatory Visit (INDEPENDENT_AMBULATORY_CARE_PROVIDER_SITE_OTHER): Payer: Self-pay | Admitting: Family Medicine

## 2018-12-22 ENCOUNTER — Encounter: Payer: Self-pay | Admitting: Family Medicine

## 2018-12-22 VITALS — BP 118/82 | HR 80 | Resp 15 | Ht 69.0 in | Wt 210.0 lb

## 2018-12-22 DIAGNOSIS — F411 Generalized anxiety disorder: Secondary | ICD-10-CM

## 2018-12-22 DIAGNOSIS — H04123 Dry eye syndrome of bilateral lacrimal glands: Secondary | ICD-10-CM

## 2018-12-22 DIAGNOSIS — F321 Major depressive disorder, single episode, moderate: Secondary | ICD-10-CM

## 2018-12-22 DIAGNOSIS — E669 Obesity, unspecified: Secondary | ICD-10-CM

## 2018-12-22 DIAGNOSIS — F329 Major depressive disorder, single episode, unspecified: Secondary | ICD-10-CM

## 2018-12-22 DIAGNOSIS — F32A Depression, unspecified: Secondary | ICD-10-CM
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
## 2018-12-22 NOTE — Patient Instructions (Addendum)
F/U in 12 weeks, call if you need me before  Work excuse for 02/18 and 19 then  return on 12/23/2018  It is important that you exercise regularly at least 30 minutes 5 times a week. If you develop chest pain, have severe difficulty breathing, or feel very tired, stop exercising immediately and seek medical attention

## 2018-12-23 ENCOUNTER — Telehealth: Payer: Self-pay | Admitting: Family Medicine

## 2018-12-23 NOTE — Telephone Encounter (Signed)
pls write for return on 12/24/2018 and let her know, she has major personal issues at home. Let her know it will not be extended past tomorrow

## 2018-12-23 NOTE — Telephone Encounter (Signed)
PT is calling in she states she needs the note to be extended one more day  She will be out on 2-20, and will go back to work on 12-24-18, please call

## 2018-12-24 ENCOUNTER — Encounter: Payer: Self-pay | Admitting: *Deleted

## 2018-12-24 NOTE — Telephone Encounter (Signed)
Work note extended through 12/23/2018 with return to work on 12/24/2018. Patient knows it wont be extended again

## 2018-12-26 ENCOUNTER — Encounter: Payer: Self-pay | Admitting: Family Medicine

## 2018-12-26 DIAGNOSIS — F411 Generalized anxiety disorder: Secondary | ICD-10-CM | POA: Insufficient documentation

## 2018-12-26 DIAGNOSIS — H04123 Dry eye syndrome of bilateral lacrimal glands: Secondary | ICD-10-CM | POA: Insufficient documentation

## 2018-12-26 DIAGNOSIS — F324 Major depressive disorder, single episode, in partial remission: Secondary | ICD-10-CM

## 2018-12-26 DIAGNOSIS — F321 Major depressive disorder, single episode, moderate: Principal | ICD-10-CM

## 2018-12-26 HISTORY — DX: Major depressive disorder, single episode, in partial remission: F32.4

## 2018-12-26 HISTORY — DX: Dry eye syndrome of bilateral lacrimal glands: H04.123

## 2018-12-26 NOTE — Assessment & Plan Note (Signed)
Deteriorated.  Patient re-educated about  the importance of commitment to a  minimum of 150 minutes of exercise per week as able.  The importance of healthy food choices with portion control discussed, as well as eating regularly and within a 12 hour window most days. The need to choose "clean , green" food 50 to 75% of the time is discussed, as well as to make water the primary drink and set a goal of 64 ounces water daily.  Encouraged to start a food diary,  and to consider  joining a support group. Sample diet sheets offered. Goals set by the patient for the next several months.   Weight /BMI 12/22/2018 11/11/2018 08/31/2018  WEIGHT 210 lb 207 lb 1.9 oz 189 lb  HEIGHT 5\' 9"  5\' 9"  5\' 8"   BMI 31.01 kg/m2 30.59 kg/m2 28.74 kg/m2  Some encounter information is confidential and restricted. Go to Review Flowsheets activity to see all data.

## 2018-12-26 NOTE — Assessment & Plan Note (Signed)
Needs therapy, overwhelmed with care of her children , esp her teen daughter,  Who is not performing at school , and is currently supposedly being home schooled , but this is not a viable option

## 2018-12-26 NOTE — Progress Notes (Signed)
Stacey Palmer     MRN: [id number]      DOB: 1981/09/06   HPI Stacey Palmer is here requesting time away from her  Job because of stress and anxiety as it relates primarily to her teenage daughter who I ssupposed to be home schooled since January 2020, but is not focused on school and her Mother finds it  Impossible to control her behavior and keep her focused on learning. Mother, the patient, Stacey Palmer , is anxious and overwhelmed, unable o focus on her job, currently has no insurance and in the past has been in therapy which she found very useful He is not suicidal or homicidal, definitely overwhelmed , being the single parent of 3 children Intends to try to get her daughter back in school as well as to seek therapy for her C/o allergy symptoms and dry eyes ROS Denies recent fever or chills. Denies sinus pressure, nasal congestion, ear pain or sore throat. Denies chest congestion, productive cough or wheezing. Denies chest pains, palpitations and leg swelling Denies abdominal pain, nausea, vomiting,diarrhea or constipation.   Denies dysuria, frequency, hesitancy or incontinence. Denies joint pain, swelling and limitation in mobility. Denies headaches, seizures, numbness, or tingling.  Denies skin break down or rash.   PE  BP 118/82   Pulse 80   Resp 15   Ht 5\' 9"  (1.753 m)   Wt 210 lb (95.3 kg)   SpO2 96%   BMI 31.01 kg/m   Patient alert and oriented and in no cardiopulmonary distress.  HEENT: No facial asymmetry, EOMI,   oropharynx pink and moist.  Neck supple no JVD, no mass.  Chest: Clear to auscultation bilaterally.  CVS: S1, S2 no murmurs, no S3.Regular rate.  ABD: Soft non tender.   Ext: No edema  MS: Adequate ROM spine, shoulders, hips and knees.  Skin: Intact, no ulcerations or rash noted.  Psych: Good eye contact,tearful at times,  anxious and  depressed appearing.  CNS: CN 2-12 intact, power,  normal throughout.no focal deficits noted.   Assessment &  Plan  Depression, major, single episode, moderate (HCC) NOT Suicidal or homicidal, overwhelmed and unable to function at work. Refer again for tele  therapy, continue effexor as before, work excuse x 2 days   GAD (generalized anxiety disorder) Needs therapy, overwhelmed with care of her children , esp her teen daughter,  Who is not performing at school , and is currently supposedly being home schooled , but this is not a viable option  Obesity (BMI 30.0-34.9) Deteriorated.  Patient re-educated about  the importance of commitment to a  minimum of 150 minutes of exercise per week as able.  The importance of healthy food choices with portion control discussed, as well as eating regularly and within a 12 hour window most days. The need to choose "clean , green" food 50 to 75% of the time is discussed, as well as to make water the primary drink and set a goal of 64 ounces water daily.  Encouraged to start a food diary,  and to consider  joining a support group. Sample diet sheets offered. Goals set by the patient for the next several months.   Weight /BMI 12/22/2018 11/11/2018 08/31/2018  WEIGHT 210 lb 207 lb 1.9 oz 189 lb  HEIGHT 5\' 9"  5\' 9"  5\' 8"   BMI 31.01 kg/m2 30.59 kg/m2 28.74 kg/m2  Some encounter information is confidential and restricted. Go to Review Flowsheets activity to see all data.  Dry eyes Symptomatic dry eye, oTC lubricant use recommended

## 2018-12-26 NOTE — Assessment & Plan Note (Signed)
Symptomatic dry eye, oTC lubricant use recommended

## 2018-12-26 NOTE — Assessment & Plan Note (Signed)
NOT Suicidal or homicidal, overwhelmed and unable to function at work. Refer again for tele  therapy, continue effexor as before, work excuse x 2 days

## 2019-01-05 ENCOUNTER — Ambulatory Visit: Payer: Self-pay | Admitting: Family Medicine

## 2019-01-06 ENCOUNTER — Other Ambulatory Visit: Payer: Self-pay | Admitting: Family Medicine

## 2019-01-10 ENCOUNTER — Telehealth: Payer: Self-pay

## 2019-01-10 NOTE — Telephone Encounter (Signed)
Writer was unable to leave a message.   Writer has information for a woman's support group located in [place] for the patient   The group is located:  [facility] [address] [place], [place] [address] Phone: [phone]

## 2019-01-11 DIAGNOSIS — F321 Major depressive disorder, single episode, moderate: Secondary | ICD-10-CM

## 2019-01-18 ENCOUNTER — Telehealth: Payer: Self-pay

## 2019-01-18 DIAGNOSIS — F411 Generalized anxiety disorder: Secondary | ICD-10-CM

## 2019-01-18 DIAGNOSIS — F321 Major depressive disorder, single episode, moderate: Secondary | ICD-10-CM
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
## 2019-02-01 ENCOUNTER — Telehealth: Payer: Self-pay

## 2019-02-01 NOTE — Telephone Encounter (Signed)
[facility] - Left Message  

## 2019-02-09 ENCOUNTER — Telehealth: Payer: Self-pay

## 2019-02-09 NOTE — Telephone Encounter (Signed)
2nd attempt - [facility]   Patient returned phone call and left a voice mail message.  Writer left a voice mail message for the patient.

## 2019-02-21 ENCOUNTER — Ambulatory Visit: Payer: Self-pay | Admitting: Family Medicine

## 2019-02-22 ENCOUNTER — Telehealth: Payer: Self-pay

## 2019-02-22 ENCOUNTER — Other Ambulatory Visit: Payer: Self-pay

## 2019-02-22 ENCOUNTER — Ambulatory Visit (INDEPENDENT_AMBULATORY_CARE_PROVIDER_SITE_OTHER): Payer: Self-pay | Admitting: Family Medicine

## 2019-02-22 VITALS — BP 120/80 | Ht 69.0 in | Wt 210.0 lb

## 2019-02-22 DIAGNOSIS — R059 Cough, unspecified: Secondary | ICD-10-CM

## 2019-02-22 DIAGNOSIS — R05 Cough: Secondary | ICD-10-CM

## 2019-02-22 DIAGNOSIS — J0100 Acute maxillary sinusitis, unspecified: Secondary | ICD-10-CM

## 2019-02-22 DIAGNOSIS — F411 Generalized anxiety disorder: Secondary | ICD-10-CM

## 2019-02-22 DIAGNOSIS — F321 Major depressive disorder, single episode, moderate: Secondary | ICD-10-CM

## 2019-02-22 HISTORY — DX: Cough, unspecified: R05.9

## 2019-02-22 MED ORDER — CEPHALEXIN 500 MG PO CAPS: 500.0000 mg | ORAL_CAPSULE | Freq: Two times a day (BID) | ORAL | 0 refills | Status: DC

## 2019-02-22 MED ORDER — BENZONATATE 100 MG PO CAPS: 100.0000 mg | ORAL_CAPSULE | Freq: Two times a day (BID) | ORAL | 0 refills | Status: DC | PRN

## 2019-02-22 MED ORDER — FLUCONAZOLE 150 MG PO TABS: 150.0000 mg | ORAL_TABLET | Freq: Once | ORAL | 0 refills | Status: AC
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
## 2019-02-22 NOTE — Patient Instructions (Addendum)
F/U  As before , call if you need me sooner  You are treated for sinusitis , I HAVE PRESCRIBED KEFLEX, FLUCONAZOLE AND AND DECONGESTANTS AT [facility], PLEASE START TODAY  WORK NOTE FROM TODAY TO RETUN ON 4 12/06/2018, PLS DOCUMENT TEMP FOR NEXT 24  TO 48 HRS SO YOU CAN KNOW IF OK TO GO TO WORK YOU WILL NEED TO GO TO URGENT CARE FOR EVALUATION IF YOU REMAIN TOO  SICK TO WORK PAST THIS DATE

## 2019-02-22 NOTE — Progress Notes (Signed)
Virtual Visit via Telephone Note  I connected with Stacey Palmer on 02/22/19 at 10:20 AM EDT by telephone and verified that I am speaking with the correct person using two identifiers.   I discussed the limitations, risks, security and privacy concerns of performing an evaluation and management service by telephone and the availability of in person appointments. I also discussed with the patient that there may be a patient responsible charge related to this service. The patient expressed understanding and agreed to proceed. Patient is in her home and I am at my home. Face to face contact atempted   History of Present Illness: 3 day h/o cough sore throat runny nose and chills, , drainage and sputum at times are yellow, throat is sore, due to work today, however , because of recent fever and  Her symptoms requests work excuse for today, wants to return in 2 days   Observations/Objective: BP 120/80   Ht 5\' 9"  (1.753 m)   Wt 210 lb (95.3 kg)   BMI 31.01 kg/m    Assessment and Plan:  Sinusitis Antibiotic and decongestant prescribed with 2 day work excuse, advised of need for clinical evaluation if she remains symptomatic   Follow Up Instructions:    I discussed the assessment and treatment plan with the patient. The patient was provided an opportunity to ask questions and all were answered. The patient agreed with the plan and demonstrated an understanding of the instructions.   The patient was advised to call back or seek an in-person evaluation if the symptoms worsen or if the condition fails to improve as anticipated.  I provided 12 minutes of non-face-to-face time during this encounter.   Syliva Overman, MD

## 2019-02-25 ENCOUNTER — Ambulatory Visit: Payer: Self-pay | Admitting: *Deleted

## 2019-02-25 NOTE — Telephone Encounter (Signed)
Pt left message on COVID voicemail 02/25/2019 at 1431; she requested a call back; contacted the pt; she states that she was seen at urgent care on 02/25/2019, and was offered the COVID 19 test but was told that there was an out of pocket fee; the pt also said that she previously had a virtual visit with her PCP, Dr Syliva Overman; she states that she is having chills, cough, diarrhea, and loss of smell; the pt would like to know about getting tested; explained to pt that routine testing is not being done; however, in her situation, she should contact her PCP or the health dept in her county; pt given the number to [facility] Dept [phone];also recommendations made per nurse protocol; she verbalizes understanding, and will contact her provider and local health dept.  Reason for Disposition . MILD difficulty breathing (e.g., minimal/no SOB at rest, SOB with walking, pulse <100)  Answer Assessment - Initial Assessment Questions 1. COVID-19 DIAGNOSIS: "Who made your Coronavirus (COVID-19) diagnosis?" "Was it confirmed by a positive lab test?" If not diagnosed by a HCP, ask "Are there lots of cases (community spread) where you live?" (See public health department website, if unsure)   * MAJOR community spread: high number of cases; numbers of cases are increasing; many people hospitalized.   * MINOR community spread: low number of cases; not increasing; few or no people hospitalized     Seen at urgent care; was offered test on 02/24/2019; major 2. ONSET: "When did the COVID-19 symptoms start?"     02/20/2019 3. WORST SYMPTOM: "What is your worst symptom?" (e.g., cough, fever, shortness of breath, muscle aches)    Body aches 4. COUGH: "How bad is the cough?"       moderate 5. FEVER: "Do you have a fever?" If so, ask: "What is your temperature, how was it measured, and when did it start?"     Did not check 6. RESPIRATORY STATUS: "Describe your breathing?" (e.g., shortness of breath,  wheezing, unable to speak)      Intermittent shortness of breath worse with exertionBETTER-SAME-WORSE: "Are you getting better, staying the same or getting worse compared to yesterday?"  If getting worse, ask, "In what way?"   worse 8. HIGH RISK DISEASE: "Do you have any chronic medical problems?" (e.g., asthma, heart or lung disease, weak immune system, etc.)    History of bronchitis 9. PREGNANCY: "Is there any chance you are pregnant?" "When was your last menstrual period?"     No LMP 02/18/2019 10. OTHER SYMPTOMS: "Do you have any other symptoms?"  (e.g., runny nose, headache, sore throat, loss of smell)       Chills, fatigue; sore throat, loss of smell, diarrhea  Protocols used: CORONAVIRUS (COVID-19) DIAGNOSED OR SUSPECTED-A-AH

## 2019-02-27 ENCOUNTER — Encounter: Payer: Self-pay | Admitting: Family Medicine

## 2019-02-27 NOTE — Assessment & Plan Note (Signed)
Antibiotic and decongestant prescribed with 2 day work excuse, advised of need for clinical evaluation if she remains symptomatic

## 2019-03-03 ENCOUNTER — Ambulatory Visit: Payer: Self-pay | Admitting: Family Medicine

## 2019-03-08 ENCOUNTER — Telehealth: Payer: Self-pay

## 2019-03-08 NOTE — Telephone Encounter (Signed)
[facility] - Left Message  

## 2019-03-09 ENCOUNTER — Other Ambulatory Visit: Payer: Self-pay | Admitting: Family Medicine

## 2019-03-10 ENCOUNTER — Encounter: Payer: Self-pay | Admitting: *Deleted

## 2019-03-15 ENCOUNTER — Telehealth: Payer: Self-pay

## 2019-03-15 DIAGNOSIS — F329 Major depressive disorder, single episode, unspecified: Secondary | ICD-10-CM

## 2019-03-15 DIAGNOSIS — F32A Depression, unspecified: Secondary | ICD-10-CM

## 2019-03-15 DIAGNOSIS — F411 Generalized anxiety disorder: Secondary | ICD-10-CM
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
## 2019-03-16 ENCOUNTER — Ambulatory Visit: Payer: Self-pay | Admitting: Family Medicine

## 2019-03-17 ENCOUNTER — Ambulatory Visit: Payer: Medicaid Other | Admitting: Family Medicine

## 2019-03-22 ENCOUNTER — Ambulatory Visit: Payer: Medicaid Other | Admitting: Family Medicine

## 2019-03-23 ENCOUNTER — Ambulatory Visit: Payer: Medicaid Other | Admitting: Family Medicine

## 2019-03-24 ENCOUNTER — Encounter: Payer: Self-pay | Admitting: Family Medicine

## 2019-03-24 ENCOUNTER — Ambulatory Visit (INDEPENDENT_AMBULATORY_CARE_PROVIDER_SITE_OTHER): Payer: Medicaid Other | Admitting: Family Medicine

## 2019-03-24 VITALS — BP 120/80 | Ht 69.0 in | Wt 210.0 lb

## 2019-03-24 DIAGNOSIS — F411 Generalized anxiety disorder: Secondary | ICD-10-CM

## 2019-03-24 DIAGNOSIS — E669 Obesity, unspecified: Secondary | ICD-10-CM

## 2019-03-24 DIAGNOSIS — F324 Major depressive disorder, single episode, in partial remission: Secondary | ICD-10-CM

## 2019-03-24 MED ORDER — VENLAFAXINE HCL ER 75 MG PO CP24: 75.0000 mg | ORAL_CAPSULE | Freq: Every day | ORAL | 3 refills | Status: DC

## 2019-03-24 NOTE — Patient Instructions (Addendum)
F/U in 10 weeks  It is important that you exercise regularly at least 30 minutes 5 times a week. If you develop chest pain, have severe difficulty breathing, or feel very tired, stop exercising immediately and seek medical attention   .Increase in dose of effexor starting today, this is sent to your pharmacy, thankful that your depression and anxiety are better  I have sent a message to your therapist with your new number  Thanks for choosing [facility], we consider it a privelige to serve you.

## 2019-03-28 NOTE — Assessment & Plan Note (Addendum)
Marked improvement , will increase effexor dose based on anxiety, and pt to continue therapy

## 2019-03-28 NOTE — Assessment & Plan Note (Signed)
  Patient re-educated about  the importance of commitment to a  minimum of 150 minutes of exercise per week as able.  The importance of healthy food choices with portion control discussed, as well as eating regularly and within a 12 hour window most days. The need to choose "clean , green" food 50 to 75% of the time is discussed, as well as to make water the primary drink and set a goal of 64 ounces water daily.    Weight /BMI 03/24/2019 02/22/2019 12/22/2018  WEIGHT 210 lb 210 lb 210 lb  HEIGHT 5\' 9"  5\' 9"  5\' 9"   BMI 31.01 kg/m2 31.01 kg/m2 31.01 kg/m2  Some encounter information is confidential and restricted. Go to Review Flowsheets activity to see all data.

## 2019-03-28 NOTE — Progress Notes (Signed)
Virtual Visit via Telephone Note  I connected with Stacey Palmer on 03/28/19 at  9:00 AM EDT by telephone and verified that I am speaking with the correct person using two identifiers.  Location: Patient: home Provider: office   I discussed the limitations, risks, security and privacy concerns of performing an evaluation and management service by telephone and the availability of in person appointments. I also discussed with the patient that there may be a patient responsible charge related to this service. The patient expressed understanding and agreed to proceed.   History of Present Illness: F/u depression and anxiety  States she feels much better and is benefiting from both medication and therapy and wants to reconnect with her thrapst Denies recent fever or chills. Denies sinus pressure, nasal congestion, ear pain or sore throat. Denies chest congestion, productive cough or wheezing. Denies chest pains, palpitations and leg swelling Denies abdominal pain, nausea, vomiting,diarrhea or constipation.   Denies dysuria, frequency, hesitancy or incontinence. Denies joint pain, swelling and limitation in mobility. Denies headaches, seizures, numbness, or tingling. . Denies skin break down or rash.       Observations/Objective: BP 120/80   Ht 5\' 9"  (1.753 m)   Wt 210 lb (95.3 kg)   BMI 31.01 kg/m  Good communication with no confusion and intact memory. Alert and oriented x 3 No signs of respiratory distress during sppech    Assessment and Plan: GAD (generalized anxiety disorder) Improved slightly, increase effexor to 75 mg daily and continue therapy  Depression, major, single episode, in partial remission (HCC) Marked improvement , will increase effexor dose based on anxiety, and pt to continue therapy  Obesity (BMI 30.0-34.9)  Patient re-educated about  the importance of commitment to a  minimum of 150 minutes of exercise per week as able.  The importance of healthy  food choices with portion control discussed, as well as eating regularly and within a 12 hour window most days. The need to choose "clean , green" food 50 to 75% of the time is discussed, as well as to make water the primary drink and set a goal of 64 ounces water daily.    Weight /BMI 03/24/2019 02/22/2019 12/22/2018  WEIGHT 210 lb 210 lb 210 lb  HEIGHT 5\' 9"  5\' 9"  5\' 9"   BMI 31.01 kg/m2 31.01 kg/m2 31.01 kg/m2  Some encounter information is confidential and restricted. Go to Review Flowsheets activity to see all data.        Follow Up Instructions:    I discussed the assessment and treatment plan with the patient. The patient was provided an opportunity to ask questions and all were answered. The patient agreed with the plan and demonstrated an understanding of the instructions.   The patient was advised to call back or seek an in-person evaluation if the symptoms worsen or if the condition fails to improve as anticipated.  I provided  12 minutes of non-face-to-face time during this encounter.   Syliva Overman, MD

## 2019-03-28 NOTE — Assessment & Plan Note (Signed)
Improved slightly, increase effexor to 75 mg daily and continue therapy

## 2019-04-05 ENCOUNTER — Telehealth: Payer: Self-pay

## 2019-04-05 NOTE — Telephone Encounter (Signed)
[facility] - Left Message  

## 2019-04-08 ENCOUNTER — Telehealth: Payer: Self-pay

## 2019-04-08 NOTE — Telephone Encounter (Signed)
2nd attempt - [facility]   

## 2019-04-11 ENCOUNTER — Encounter: Payer: Self-pay | Admitting: Family Medicine

## 2019-04-11 ENCOUNTER — Other Ambulatory Visit: Payer: Self-pay

## 2019-04-11 ENCOUNTER — Telehealth: Payer: Self-pay

## 2019-04-11 ENCOUNTER — Ambulatory Visit (INDEPENDENT_AMBULATORY_CARE_PROVIDER_SITE_OTHER): Payer: BC Managed Care – PPO | Admitting: Family Medicine

## 2019-04-11 VITALS — BP 108/78 | HR 82 | Temp 98.4°F | Resp 15 | Ht 69.0 in | Wt 222.0 lb

## 2019-04-11 DIAGNOSIS — E669 Obesity, unspecified: Secondary | ICD-10-CM

## 2019-04-11 DIAGNOSIS — T23262A Burn of second degree of back of left hand, initial encounter: Secondary | ICD-10-CM

## 2019-04-11 DIAGNOSIS — F4321 Adjustment disorder with depressed mood: Secondary | ICD-10-CM | POA: Diagnosis not present

## 2019-04-11 DIAGNOSIS — E559 Vitamin D deficiency, unspecified: Secondary | ICD-10-CM | POA: Diagnosis not present

## 2019-04-11 DIAGNOSIS — T23062A Burn of unspecified degree of back of left hand, initial encounter: Secondary | ICD-10-CM | POA: Insufficient documentation

## 2019-04-11 MED ORDER — SILVER SULFADIAZINE 1 % EX CREA: TOPICAL_CREAM | CUTANEOUS | 0 refills | Status: DC

## 2019-04-11 MED ORDER — FLUCONAZOLE 150 MG PO TABS: 150.0000 mg | ORAL_TABLET | Freq: Once | ORAL | 0 refills | Status: DC

## 2019-04-11 MED ORDER — CEPHALEXIN 500 MG PO CAPS: 500.0000 mg | ORAL_CAPSULE | Freq: Three times a day (TID) | ORAL | 0 refills | Status: DC

## 2019-04-11 MED ORDER — PHENTERMINE HCL 37.5 MG PO TABS: ORAL_TABLET | ORAL | 1 refills | Status: DC
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
## 2019-04-11 NOTE — Assessment & Plan Note (Addendum)
Clean with  [place]io[place], apply dry gauze loosely and loose bandage on the job Sivadene to burn and 5 days of kefllex Work excuse as stated
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
## 2019-04-11 NOTE — Telephone Encounter (Signed)
[facility] - Left Message  

## 2019-04-11 NOTE — Assessment & Plan Note (Addendum)
Deteriorated.Start half phentermine daily  Patient re-educated about  the importance of commitment to a  minimum of 150 minutes of exercise per week as able.  The importance of healthy food choices with portion control discussed, as well as eating regularly and within a 12 hour window most days. The need to choose "clean , green" food 50 to 75% of the time is discussed, as well as to make water the primary drink and set a goal of 64 ounces water daily.  Encouraged to start a food diary,  and to consider  joining a support group. Sample diet sheets offered. Goals set by the patient for the next several months.   Weight /BMI 04/11/2019 03/24/2019 02/22/2019  WEIGHT 222 lb 210 lb 210 lb  HEIGHT 5\' 9"  5\' 9"  5\' 9"   BMI 32.78 kg/m2 31.01 kg/m2 31.01 kg/m2  Some encounter information is confidential and restricted. Go to Review Flowsheets activity to see all data.

## 2019-04-12 ENCOUNTER — Telehealth: Payer: Self-pay | Admitting: *Deleted

## 2019-04-12 NOTE — Telephone Encounter (Signed)
[name] received via fax Copied noted sleeved

## 2019-04-13 ENCOUNTER — Other Ambulatory Visit: Payer: Self-pay

## 2019-04-13 ENCOUNTER — Ambulatory Visit: Payer: Medicaid Other

## 2019-04-13 DIAGNOSIS — M79643 Pain in unspecified hand: Secondary | ICD-10-CM

## 2019-04-17 NOTE — Assessment & Plan Note (Signed)
Updated lab needed at/ before next visit. Needs to comply with treatment

## 2019-04-17 NOTE — Assessment & Plan Note (Signed)
Improved with therapy Continue medication

## 2019-04-18 ENCOUNTER — Telehealth: Payer: Self-pay

## 2019-04-18 NOTE — Telephone Encounter (Signed)
[facility] - 3rd attempt  

## 2019-04-19 ENCOUNTER — Telehealth: Payer: Self-pay

## 2019-04-19 NOTE — Telephone Encounter (Signed)
[facility] Inactive - Several attempts have been made to contact patient without success. Patient will be placed on the inactive list.  If services are needed again.  Please contact [facility] at [phone].    Information will be routed to the PCP and Dr. Hisada  

## 2019-05-09 ENCOUNTER — Other Ambulatory Visit: Payer: Self-pay | Admitting: Family Medicine

## 2019-05-10 ENCOUNTER — Other Ambulatory Visit: Payer: Self-pay

## 2019-05-10 DIAGNOSIS — Z20822 Contact with and (suspected) exposure to covid-19: Secondary | ICD-10-CM

## 2019-05-14 LAB — NOVEL CORONAVIRUS, NAA: SARS-CoV-2, NAA: NOT DETECTED

## 2019-05-25 ENCOUNTER — Other Ambulatory Visit: Payer: Self-pay

## 2019-05-25 ENCOUNTER — Encounter: Payer: Self-pay | Admitting: Family Medicine

## 2019-05-25 ENCOUNTER — Telehealth (INDEPENDENT_AMBULATORY_CARE_PROVIDER_SITE_OTHER): Payer: BC Managed Care – PPO | Admitting: Family Medicine

## 2019-05-25 VITALS — Ht 69.0 in | Wt 210.0 lb

## 2019-05-25 DIAGNOSIS — R05 Cough: Secondary | ICD-10-CM

## 2019-05-25 DIAGNOSIS — Z20828 Contact with and (suspected) exposure to other viral communicable diseases: Secondary | ICD-10-CM | POA: Diagnosis not present

## 2019-05-25 DIAGNOSIS — Z20822 Contact with and (suspected) exposure to covid-19: Secondary | ICD-10-CM

## 2019-05-25 DIAGNOSIS — R059 Cough, unspecified: Secondary | ICD-10-CM

## 2019-05-25 NOTE — Progress Notes (Signed)
Subjective:     Patient ID: Stacey Palmer, female   DOB: 05-18-1981, 38 y.o.   MRN: [id number]  Stacey Palmer presents for Cough (sneezing and running nose started this past weekend. pt went and got tested for Covid today)  Location of Patient: Home Location of Provider: Telehealth Consent was obtain for visit to be over via telehealth. I verified that I am speaking with the correct person using two identifiers.  Sneezing and cough-productive yellow as well Nasal Drainage-yellow mucus Ear pain mild No sore throat Headache Mouth is dry Hot and cold, unable to take temp. Pressure in face  Diarrhea Mild shortness of breath   Past Medical, Surgical, Social History, Allergies, and Medications have been Reviewed.   Past Medical History:  Diagnosis Date  . Allergy   . Anxiety   . Obesity   . Sleep apnea    Past Surgical History:  Procedure Laterality Date  . CESAREAN SECTION    . TUBAL LIGATION     Social History   Socioeconomic History  . Marital status: Single    Spouse name: Not on file  . Number of children: Not on file  . Years of education: Not on file  . Highest education level: Not on file  Occupational History  . Not on file  Social Needs  . Financial resource strain: Not on file  . Food insecurity    Worry: Not on file    Inability: Not on file  . Transportation needs    Medical: Not on file    Non-medical: Not on file  Tobacco Use  . Smoking status: Never Smoker  . Smokeless tobacco: Never Used  Substance and Sexual Activity  . Alcohol use: No    Comment: 01-27-2017 per pt occa. Wine  . Drug use: No    Comment: 01-27-2017 per pt no  . Sexual activity: Not Currently    Birth control/protection: Surgical  Lifestyle  . Physical activity    Days per week: Not on file    Minutes per session: Not on file  . Stress: Not on file  Relationships  . Social Herbalist on phone: Not on file    Gets together: Not on file    Attends religious  service: Not on file    Active member of club or organization: Not on file    Attends meetings of clubs or organizations: Not on file    Relationship status: Not on file  . Intimate partner violence    Fear of current or ex partner: Not on file    Emotionally abused: Not on file    Physically abused: Not on file    Forced sexual activity: Not on file  Other Topics Concern  . Not on file  Social History Narrative  . Not on file    Outpatient Encounter Medications as of 05/25/2019  Medication Sig  . acyclovir (ZOVIRAX) 400 MG tablet TAKE 1 TABLET BY MOUTH THREE TIMES DAILY  . fluticasone (FLONASE) 50 MCG/ACT nasal spray Place 2 sprays into both nostrils daily.  . montelukast (SINGULAIR) 10 MG tablet Take 1 tablet (10 mg total) by mouth at bedtime.  . Norgestimate-Ethinyl Estradiol Triphasic 0.18/0.215/0.25 MG-25 MCG tab Take 1 tablet by mouth daily.  . phentermine (ADIPEX-P) 37.5 MG tablet Take one half tablet every morning before breakfast  . silver sulfADIAZINE (SILVADENE) 1 % cream Apply to burn once daily x 10 days, then , as needed  . venlafaxine XR (  EFFEXOR-XR) 75 MG 24 hr capsule Take 1 capsule (75 mg total) by mouth daily with breakfast. Dose increase effective 03/24/2019  . Vitamin D, Ergocalciferol, (DRISDOL) 1.25 MG (50000 UT) CAPS capsule TAKE 1 CAPSULE BY MOUTH ONCE A WEEK AS DIRECTED  . [DISCONTINUED] cephALEXin (KEFLEX) 500 MG capsule Take 1 capsule (500 mg total) by mouth 3 (three) times daily. (Patient not taking: Reported on 05/25/2019)   No facility-administered encounter medications on file as of 05/25/2019.    Allergies  Allergen Reactions  . Shellfish Allergy Hives    Reports only once (she did not have any episode afterwards)    Review of Systems  Constitutional: Negative for chills and fever.  HENT: Positive for sneezing.   Eyes: Negative.   Respiratory: Positive for cough. Negative for shortness of breath.   Cardiovascular: Negative.   Gastrointestinal:  Negative.   Endocrine: Negative.   Genitourinary: Negative.   Musculoskeletal: Negative.   Skin: Negative.   Allergic/Immunologic: Negative.   Neurological: Negative.   Hematological: Negative.   Psychiatric/Behavioral: Negative.   All other systems reviewed and are negative.      Objective:     Ht 5\' 9"  (1.753 m)   Wt 210 lb (95.3 kg)   BMI 31.01 kg/m   Physical Exam Pulmonary:     Comments: No noted shortness of breath in conversation Neurological:     Mental Status: She is alert and oriented to person, place, and time.  Psychiatric:        Mood and Affect: Mood normal.        Behavior: Behavior normal.        Thought Content: Thought content normal.        Assessment and Plan        1. Cough New onset cough from last weekend. Recent travel to [place]  2. Suspected Covid-19 Virus Infection S&S of today's visit are consistent with COVID. With wide variety of symptoms it is difficult to assess at times. Was negative 2 weeks ago. But recent travel and new Sx. But new onset of cough, sneezing, feverish, chills, and diarrhea suspicious are still present She went herself and got repeat testing. Advised to stay home until we know results.  I provided 10 minutes of non-face-to-face time during this encounter.  [name] , DNP, AGNP-BC [facility] [address], [address] [place], [place] [address] Office Hours: Mon-Thurs 8 am-5 pm; Fri 8 am-12 pm Office Phone:  [phone]  Office Fax: [phone]

## 2019-05-25 NOTE — Patient Instructions (Addendum)
Thank you for coming into the office today. I appreciate the opportunity to provide you with the care for your health and wellness. Today we discussed: cough   Follow up: as needed  No labs today  Stay home until we know your COVID results.  We will give work note if needed.  Advised of symptom management at this time. Drinks fluids. Tylenol for fevers and headaches Vitamin C Rest  Please continue to practice social distancing to keep you, your family, and our community safe.  If you must go out, please wear a Mask and practice good handwashing.  [place] YOUR HANDS WELL AND FREQUENTLY. AVOID TOUCHING YOUR FACE, UNLESS YOUR HANDS ARE FRESHLY WASHED.  GET FRESH AIR DAILY. STAY HYDRATED WITH WATER.   It was a pleasure to see you and I look forward to continuing to work together on your health and well-being. Please do not hesitate to call the office if you need care or have questions about your care.  Have a wonderful day and week.  With Gratitude,  Cherly Beach, DNP, AGNP-BC     Person Under Monitoring Name: Stacey Palmer  Location: [address] [place] [address]   Infection Prevention Recommendations for Individuals Confirmed to have, or Being Evaluated for, 2019 Novel Coronavirus (COVID-19) Infection Who Receive Care at Home  Individuals who are confirmed to have, or are being evaluated for, COVID-19 should follow the prevention steps below until a healthcare provider or local or state health department says they can return to normal activities.  Stay home except to get medical care You should restrict activities outside your home, except for getting medical care. Do not go to work, school, or public areas, and do not use public transportation or taxis.  Call ahead before visiting your doctor Before your medical appointment, call the healthcare provider and tell them that you have, or are being evaluated for, COVID-19 infection. This will help the  healthcare providers office take steps to keep other people from getting infected. Ask your healthcare provider to call the local or state health department.  Monitor your symptoms Seek prompt medical attention if your illness is worsening (e.g., difficulty breathing). Before going to your medical appointment, call the healthcare provider and tell them that you have, or are being evaluated for, COVID-19 infection. Ask your healthcare provider to call the local or state health department.  Wear a facemask You should wear a facemask that covers your nose and mouth when you are in the same room with other people and when you visit a healthcare provider. People who live with or visit you should also wear a facemask while they are in the same room with you.  Separate yourself from other people in your home As much as possible, you should stay in a different room from other people in your home. Also, you should use a separate bathroom, if available.  Avoid sharing household items You should not share dishes, drinking glasses, cups, eating utensils, towels, bedding, or other items with other people in your home. After using these items, you should wash them thoroughly with soap and water.  Cover your coughs and sneezes Cover your mouth and nose with a tissue when you cough or sneeze, or you can cough or sneeze into your sleeve. Throw used tissues in a lined trash can, and immediately wash your hands with soap and water for at least 20 seconds or use an alcohol-based hand rub.  Wash your Proofreader your hands  often and thoroughly with soap and water for at least 20 seconds. You can use an alcohol-based hand sanitizer if soap and water are not available and if your hands are not visibly dirty. Avoid touching your eyes, nose, and mouth with unwashed hands.   Prevention Steps for Caregivers and Household Members of Individuals Confirmed to have, or Being Evaluated for, COVID-19 Infection Being  Cared for in the Home  If you live with, or provide care at home for, a person confirmed to have, or being evaluated for, COVID-19 infection please follow these guidelines to prevent infection:  Follow healthcare providers instructions Make sure that you understand and can help the patient follow any healthcare provider instructions for all care.  Provide for the patients basic needs You should help the patient with basic needs in the home and provide support for getting groceries, prescriptions, and other personal needs.  Monitor the patients symptoms If they are getting sicker, call his or her medical provider and tell them that the patient has, or is being evaluated for, COVID-19 infection. This will help the healthcare providers office take steps to keep other people from getting infected. Ask the healthcare provider to call the local or state health department.  Limit the number of people who have contact with the patient  If possible, have only one caregiver for the patient.  Other household members should stay in another home or place of residence. If this is not possible, they should stay  in another room, or be separated from the patient as much as possible. Use a separate bathroom, if available.  Restrict visitors who do not have an essential need to be in the home.  Keep older adults, very young children, and other sick people away from the patient Keep older adults, very young children, and those who have compromised immune systems or chronic health conditions away from the patient. This includes people with chronic heart, lung, or kidney conditions, diabetes, and cancer.  Ensure good ventilation Make sure that shared spaces in the home have good air flow, such as from an air conditioner or an opened window, weather permitting.  Wash your hands often  Wash your hands often and thoroughly with soap and water for at least 20 seconds. You can use an alcohol based hand  sanitizer if soap and water are not available and if your hands are not visibly dirty.  Avoid touching your eyes, nose, and mouth with unwashed hands.  Use disposable paper towels to dry your hands. If not available, use dedicated cloth towels and replace them when they become wet.  Wear a facemask and gloves  Wear a disposable facemask at all times in the room and gloves when you touch or have contact with the patients blood, body fluids, and/or secretions or excretions, such as sweat, saliva, sputum, nasal mucus, vomit, urine, or feces.  Ensure the mask fits over your nose and mouth tightly, and do not touch it during use.  Throw out disposable facemasks and gloves after using them. Do not reuse.  Wash your hands immediately after removing your facemask and gloves.  If your personal clothing becomes contaminated, carefully remove clothing and launder. Wash your hands after handling contaminated clothing.  Place all used disposable facemasks, gloves, and other waste in a lined container before disposing them with other household waste.  Remove gloves and wash your hands immediately after handling these items.  Do not share dishes, glasses, or other household items with the patient  Avoid sharing  household items. You should not share dishes, drinking glasses, cups, eating utensils, towels, bedding, or other items with a patient who is confirmed to have, or being evaluated for, COVID-19 infection.  After the person uses these items, you should wash them thoroughly with soap and water.  Wash laundry thoroughly  Immediately remove and wash clothes or bedding that have blood, body fluids, and/or secretions or excretions, such as sweat, saliva, sputum, nasal mucus, vomit, urine, or feces, on them.  Wear gloves when handling laundry from the patient.  Read and follow directions on labels of laundry or clothing items and detergent. In general, wash and dry with the warmest temperatures  recommended on the label.  Clean all areas the individual has used often  Clean all touchable surfaces, such as counters, tabletops, doorknobs, bathroom fixtures, toilets, phones, keyboards, tablets, and bedside tables, every day. Also, clean any surfaces that may have blood, body fluids, and/or secretions or excretions on them.  Wear gloves when cleaning surfaces the patient has come in contact with.  Use a diluted bleach solution (e.g., dilute bleach with 1 part bleach and 10 parts water) or a household disinfectant with a label that says EPA-registered for coronaviruses. To make a bleach solution at home, add 1 tablespoon of bleach to 1 quart (4 cups) of water. For a larger supply, add  cup of bleach to 1 gallon (16 cups) of water.  Read labels of cleaning products and follow recommendations provided on product labels. Labels contain instructions for safe and effective use of the cleaning product including precautions you should take when applying the product, such as wearing gloves or eye protection and making sure you have good ventilation during use of the product.  Remove gloves and wash hands immediately after cleaning.  Monitor yourself for signs and symptoms of illness Caregivers and household members are considered close contacts, should monitor their health, and will be asked to limit movement outside of the home to the extent possible. Follow the monitoring steps for close contacts listed on the symptom monitoring form.   ? If you have additional questions, contact your local health department or call the epidemiologist on call at [phone] (available 24/7). ? This guidance is subject to change. For the most up-to-date guidance from [facility], please refer to their website: [employer]Met[employer].huhttps://www.cdc.gov/coronavirus/2019-ncov/hcp/guidance-prevent-spread.html

## 2019-05-29 ENCOUNTER — Encounter: Payer: Self-pay | Admitting: Family Medicine

## 2019-05-29 LAB — NOVEL CORONAVIRUS, NAA: SARS-CoV-2, NAA: NOT DETECTED

## 2019-05-30 ENCOUNTER — Encounter: Payer: Self-pay | Admitting: Family Medicine

## 2019-05-30 ENCOUNTER — Other Ambulatory Visit: Payer: Self-pay | Admitting: Family Medicine

## 2019-05-30 ENCOUNTER — Telehealth: Payer: Self-pay | Admitting: Family Medicine

## 2019-05-30 MED ORDER — ONDANSETRON HCL 4 MG PO TABS: 4.0000 mg | ORAL_TABLET | Freq: Three times a day (TID) | ORAL | 0 refills | Status: DC | PRN

## 2019-05-30 NOTE — Telephone Encounter (Signed)
See previous patient call routed to MD

## 2019-05-30 NOTE — Telephone Encounter (Signed)
Pt states that she has been feeling nauseated today when she tries to eat and I advised her to eat brat diet and she would like something called in for nausea and a work note that says she can return to work Saturday (her next day to work) since her covid test was negative. Please advise

## 2019-05-30 NOTE — Telephone Encounter (Signed)
I would write to return to work tomorrow, if she is not to work till Saturday , thenm does not Camera operator what note says, but 1 day out based on this call is what I am writing for, zofran prescribed

## 2019-05-30 NOTE — Telephone Encounter (Signed)
Pt LVM that she needed a nurse to call her

## 2019-05-30 NOTE — Telephone Encounter (Signed)
Spoke with patient regarding work note and nausea symptoms and sent message to md

## 2019-05-31 ENCOUNTER — Telehealth: Payer: Self-pay | Admitting: Family Medicine

## 2019-05-31 NOTE — Telephone Encounter (Signed)
Return to work note completed to return tomorrow

## 2019-05-31 NOTE — Telephone Encounter (Signed)
Pt received negative test results  °

## 2019-06-01 ENCOUNTER — Telehealth: Payer: Self-pay | Admitting: Family Medicine

## 2019-06-01 NOTE — Telephone Encounter (Signed)
Pt has lft a msg indicating she needs a note for work, and she is still congested w\ mucus  And nausea

## 2019-06-02 NOTE — Telephone Encounter (Signed)
I did a work note for her already

## 2019-06-06 ENCOUNTER — Other Ambulatory Visit: Payer: Self-pay | Admitting: Family Medicine

## 2019-06-06 ENCOUNTER — Telehealth: Payer: Self-pay | Admitting: *Deleted

## 2019-06-06 NOTE — Telephone Encounter (Signed)
Pt called and said she had spoke to someone via phone and virtually that was like a counselor and wanted to know if Dr. Moshe Cipro knew how to get back in touch with them so she could talk to them again.

## 2019-06-06 NOTE — Telephone Encounter (Signed)
Sent mychart a message

## 2019-06-06 NOTE — Telephone Encounter (Signed)
I sent a request to Ava who she ius referring to, let her know to be on the alert for the call

## 2019-06-07 ENCOUNTER — Telehealth: Payer: Self-pay

## 2019-06-07 NOTE — Telephone Encounter (Signed)
  [facility] - Unable to leave a message.

## 2019-06-08 ENCOUNTER — Ambulatory Visit: Payer: Medicaid Other | Admitting: Family Medicine

## 2019-06-15 ENCOUNTER — Ambulatory Visit: Payer: Medicaid Other | Admitting: Family Medicine

## 2019-06-21 ENCOUNTER — Ambulatory Visit: Payer: Medicaid Other | Admitting: Family Medicine

## 2019-06-23 ENCOUNTER — Ambulatory Visit: Payer: Medicaid Other | Admitting: Family Medicine

## 2019-06-27 ENCOUNTER — Other Ambulatory Visit: Payer: Self-pay

## 2019-06-27 ENCOUNTER — Ambulatory Visit: Payer: Medicaid Other | Admitting: Family Medicine

## 2019-06-28 ENCOUNTER — Other Ambulatory Visit: Payer: Self-pay

## 2019-06-28 DIAGNOSIS — Z20822 Contact with and (suspected) exposure to covid-19: Secondary | ICD-10-CM

## 2019-06-29 LAB — NOVEL CORONAVIRUS, NAA: SARS-CoV-2, NAA: NOT DETECTED

## 2019-06-30 ENCOUNTER — Telehealth (INDEPENDENT_AMBULATORY_CARE_PROVIDER_SITE_OTHER): Payer: BC Managed Care – PPO | Admitting: Family Medicine

## 2019-06-30 ENCOUNTER — Other Ambulatory Visit: Payer: Self-pay

## 2019-06-30 ENCOUNTER — Encounter: Payer: Self-pay | Admitting: Family Medicine

## 2019-06-30 VITALS — BP 108/78 | Ht 69.0 in | Wt 222.0 lb

## 2019-06-30 DIAGNOSIS — J019 Acute sinusitis, unspecified: Secondary | ICD-10-CM

## 2019-06-30 DIAGNOSIS — R05 Cough: Secondary | ICD-10-CM

## 2019-06-30 DIAGNOSIS — L309 Dermatitis, unspecified: Secondary | ICD-10-CM

## 2019-06-30 DIAGNOSIS — R059 Cough, unspecified: Secondary | ICD-10-CM

## 2019-06-30 DIAGNOSIS — F324 Major depressive disorder, single episode, in partial remission: Secondary | ICD-10-CM

## 2019-06-30 MED ORDER — VITAMIN D (ERGOCALCIFEROL) 1.25 MG (50000 UNIT) PO CAPS: ORAL_CAPSULE | ORAL | 4 refills | Status: DC

## 2019-06-30 MED ORDER — BETAMETHASONE DIPROPIONATE 0.05 % EX CREA: TOPICAL_CREAM | Freq: Two times a day (BID) | CUTANEOUS | 0 refills | Status: DC

## 2019-06-30 MED ORDER — BENZONATATE 100 MG PO CAPS: 100.0000 mg | ORAL_CAPSULE | Freq: Two times a day (BID) | ORAL | 1 refills | Status: DC | PRN

## 2019-06-30 NOTE — Progress Notes (Signed)
   Stacey Palmer     MRN: [id number]      DOB: 05/17/1981   I connected with  Stacey Palmer on 07/02/19 by a video enabled telemedicine application and verified that I am speaking with the correct person using two identifiers.   I discussed the limitations of evaluation and management by telemedicine. The patient expressed understanding and agreed to proceed.  Virtual Visit via Telephone Note  I connected with Stacey Palmer on 07/02/19 at  4:00 PM EDT by telephone and verified that I am speaking with the correct person using two identifiers.  Location: Patient: home Provider: office   I discussed the limitations, risks, security and privacy concerns of performing an evaluation and management service by telephone and the availability of in person appointments. I also discussed with the patient that there may be a patient responsible charge related to this service. The patient expressed understanding and agreed to proceed.   History of Present Illness:   see below Observations/Objective: BP 108/78   Ht 5\' 9"  (1.753 m)   Wt 222 lb (100.7 kg)   BMI 32.78 kg/m  Good communication with no confusion and intact memory. Alert and oriented x 3 No signs of respiratory distress during speech    Assessment and Plan: Sinusitis Antibiotic as prescribed by dentist  Cough Decongestant prescribed, she will use the antibiotic also  Depression, major, single episode, in partial remission ([place]) Continue medication, and management through therapy    Follow Up Instructions:    I discussed the assessment and treatment plan with the patient. The patient was provided an opportunity to ask questions and all were answered. The patient agreed with the plan and demonstrated an understanding of the instructions.   The patient was advised to call back or seek an in-person evaluation if the symptoms worsen or if the condition fails to improve as anticipated.  I provided 12 minutes of non-face-to-face  time during this encounter.   Stacey Nakayama, MD    Stacey Palmer is here with c/o 1 week ago, was on antibiotic for infected tooth, has been on antibiotic, and states she went to an oral surgeon in [place], still having pain in the tooth, penicillin to be started today, had been on a moxicillin before, states , reduced nasal drainage but reports cough and congestion , yellow sputum, increasing , symptoms  Started last weekend, slight chills last night  Decongestant will be sent to the pharmacy.she is to use antibiotics prescribed by dentist Hands peeling x 3 days ROS Denies chest congestion, productive cough or wheezing. Denies chest pains, palpitations and leg swelling Denies abdominal pain, nausea, vomiting,diarrhea or constipation.   Denies dysuria, frequency, hesitancy or incontinence. Denies joint pain, swelling and limitation in mobility. Denies headaches, seizures, numbness, or tingling. Denies uncontrolled depression, anxiety or insomnia.c/o difficulty raising her children    PE  BP 108/78   Ht 5\' 9"  (1.753 m)   Wt 222 lb (100.7 kg)   BMI 32.78 kg/m   Good communication with no confusion and intact memory. Alert and oriented x 3 No signs of respiratory distress during speech    Assessment & Plan  Sinusitis Antibiotic as prescribed by dentist  Cough Decongestant prescribed, she will use the antibiotic also  Depression, major, single episode, in partial remission ([place]) Continue medication, and management through therapy  Dermatitis Topical steroid for 5 days, then , as needed

## 2019-06-30 NOTE — Patient Instructions (Addendum)
F/U  With video visit ,  end October early November, call if you need me sooner  Cream sent to be used on rash on both hands  Please take the penicillin prescribed by the oral surgeon for your chest congestion  I have prescribed tessalon perles to help with chest congestion  Continue to work on your weight with food choice, portin control and exercise

## 2019-07-02 ENCOUNTER — Encounter: Payer: Self-pay | Admitting: Family Medicine

## 2019-07-02 NOTE — Assessment & Plan Note (Signed)
Topical steroid for 5 days, then , as needed

## 2019-07-02 NOTE — Assessment & Plan Note (Signed)
Decongestant prescribed, she will use the antibiotic also

## 2019-07-02 NOTE — Assessment & Plan Note (Signed)
Continue medication, and management through therapy

## 2019-07-02 NOTE — Assessment & Plan Note (Signed)
Antibiotic as prescribed by dentist

## 2019-08-08 ENCOUNTER — Other Ambulatory Visit: Payer: Self-pay

## 2019-08-08 ENCOUNTER — Ambulatory Visit (INDEPENDENT_AMBULATORY_CARE_PROVIDER_SITE_OTHER): Payer: BC Managed Care – PPO

## 2019-08-08 DIAGNOSIS — Z23 Encounter for immunization: Secondary | ICD-10-CM

## 2019-08-17 ENCOUNTER — Encounter: Payer: Self-pay | Admitting: Family Medicine

## 2019-08-17 ENCOUNTER — Ambulatory Visit (INDEPENDENT_AMBULATORY_CARE_PROVIDER_SITE_OTHER): Payer: BC Managed Care – PPO | Admitting: Family Medicine

## 2019-08-17 ENCOUNTER — Other Ambulatory Visit: Payer: Self-pay

## 2019-08-17 DIAGNOSIS — J069 Acute upper respiratory infection, unspecified: Secondary | ICD-10-CM | POA: Diagnosis not present

## 2019-08-17 DIAGNOSIS — R059 Cough, unspecified: Secondary | ICD-10-CM

## 2019-08-17 DIAGNOSIS — R05 Cough: Secondary | ICD-10-CM | POA: Diagnosis not present

## 2019-08-17 DIAGNOSIS — R0989 Other specified symptoms and signs involving the circulatory and respiratory systems: Secondary | ICD-10-CM

## 2019-08-17 DIAGNOSIS — Z7189 Other specified counseling: Secondary | ICD-10-CM

## 2019-08-17 MED ORDER — SALINE NASAL SPRAY 0.65 % NA SOLN: 1.0000 | NASAL | 1 refills | Status: DC | PRN

## 2019-08-17 MED ORDER — PROMETHAZINE-DM 6.25-15 MG/5ML PO SYRP: 5.0000 mL | ORAL_SOLUTION | Freq: Four times a day (QID) | ORAL | 0 refills | Status: DC | PRN

## 2019-08-17 MED ORDER — GUAIFENESIN ER 600 MG PO TB12: 600.0000 mg | ORAL_TABLET | Freq: Two times a day (BID) | ORAL | 0 refills | Status: AC

## 2019-08-17 NOTE — Patient Instructions (Addendum)
   I appreciate the opportunity to provide you with the care for your health and wellness. Today we discussed: upper respiratory viral infection   Follow up: as needed, 09/14/2019 as scheduled   No labs or referrals today  Get COVID testing today. Please follow guidelines post testing until you are cleared.  Work note to return Monday.  I have sent medications to the pharmacy for you. Take these as directed.  As suggested for symptom management use Nasal saline spray to help ease congestion and dry nasal passages. Can be used throughout the day as needed. Avoid forceful blowing of nose. It will be common to have some blood if blowing nose a lot or if nose is very dry. Can you a humidifier at home as well. Remember to wash and air it out regularly. Tylenol (325 mg 2 tablets every 6 hours) and or Ibuprofen (200- 400 mg every 8 hours) for fever, sore throat and body aches. Can consider use of a Neti-pot to wash out sinus cavity (twice daily). Please be aware that you need to use distilled or boiled water for these. If congestion is increasing and or coughing can use Mucinex (twice daily) for cough and congestion with full glass of water.  If you have a sore throat warm fluids like hot tea or lemon water can help sooth this.   Please hydrate, rest, get fresh air daily and wash your hands well.  I hope you feel better soon.  Please continue to practice social distancing to keep you, your family, and our community safe.  If you must go out, please wear a mask and practice good handwashing.  It was a pleasure to see you and I look forward to continuing to work together on your health and well-being. Please do not hesitate to call the office if you need care or have questions about your care.  Have a wonderful day and week. With Gratitude, Cherly Beach, DNP, AGNP-BC

## 2019-08-17 NOTE — Progress Notes (Signed)
Virtual Visit via Telephone Note   This visit type was conducted due to national recommendations for restrictions regarding the COVID-19 Pandemic (e.g. social distancing) in an effort to limit this patient's exposure and mitigate transmission in our community.  Due to her co-morbid illnesses, this patient is at least at moderate risk for complications without adequate follow up.  This format is felt to be most appropriate for this patient at this time.  The patient did not have access to video technology/had technical difficulties with video requiring transitioning to audio format only (telephone).  All issues noted in this document were discussed and addressed.  No physical exam could be performed with this format.    Evaluation Performed:  Follow-up visit  Date:  08/17/2019   ID:  Stacey Palmer, Stacey Palmer February 26, 1981, MRN [id number]  Patient Location: Home Provider Location: Office  Location of Patient: Home Location of Provider: Telehealth Consent was obtain for visit to be over via telehealth. I verified that I am speaking with the correct person using two identifiers.  PCP:  Kerri Perches, MD   Chief Complaint:  Cough and sore throat  History of Present Illness:    Stacey Palmer is a 38 y.o. female with history of allergies, anxiety, obesity, sleep apnea.  Presents today after having 3 days of increasing symptoms including sore throat, sneezing, cough with some yellow production of mucus.  Reports this is coming some from her nasal passages some from her chest.  She declines exposure to COVID.  Has been tested multiple times over the year.  And has been negative.  Reports some fevers and chills and feeling sweaty over the night.  Reports headache, ear pain, cough that is uncontrolled at times.  Uncontrollable runny nose.  Stomachache, sore throat as previously stated.  Body aches.  Reports that she does not have any shortness of breath.  Flu shot about 1 week back she reports.  She  denies being around anybody recently that has had similar symptoms.  The patient does have symptoms concerning for COVID-19 infection (fever, chills, cough, or new shortness of breath).    Past Medical, Surgical, Social History, Allergies, and Medications have been Reviewed.   Past Medical History:  Diagnosis Date  . Allergy   . Anxiety   . Obesity   . Sleep apnea    Past Surgical History:  Procedure Laterality Date  . CESAREAN SECTION    . TUBAL LIGATION       Current Meds  Medication Sig  . acyclovir (ZOVIRAX) 400 MG tablet TAKE 1 TABLET BY MOUTH THREE TIMES DAILY  . benzonatate (TESSALON) 100 MG capsule Take 1 capsule (100 mg total) by mouth 2 (two) times daily as needed for cough.  . betamethasone dipropionate (DIPROLENE) 0.05 % cream Apply topically 2 (two) times daily.  . fluticasone (FLONASE) 50 MCG/ACT nasal spray Place 2 sprays into both nostrils daily.  . montelukast (SINGULAIR) 10 MG tablet Take 1 tablet (10 mg total) by mouth at bedtime.  . Norgestimate-Ethinyl Estradiol Triphasic 0.18/0.215/0.25 MG-25 MCG tab Take 1 tablet by mouth daily.  . ondansetron (ZOFRAN) 4 MG tablet Take 1 tablet (4 mg total) by mouth every 8 (eight) hours as needed for nausea or vomiting.  . phentermine (ADIPEX-P) 37.5 MG tablet Take one half tablet every morning before breakfast  . silver sulfADIAZINE (SILVADENE) 1 % cream Apply to burn once daily x 10 days, then , as needed  . venlafaxine XR (EFFEXOR-XR) 75 MG  24 hr capsule Take 1 capsule (75 mg total) by mouth daily with breakfast. Dose increase effective 03/24/2019  . Vitamin D, Ergocalciferol, (DRISDOL) 1.25 MG (50000 UT) CAPS capsule TAKE 1 CAPSULE BY MOUTH ONCE A WEEK AS DIRECTED     Allergies:   Shellfish allergy   Social History   Tobacco Use  . Smoking status: Never Smoker  . Smokeless tobacco: Never Used  Substance Use Topics  . Alcohol use: No    Comment: 01-27-2017 per pt occa. Wine  . Drug use: No    Comment: 01-27-2017  per pt no     Family Hx: The patient's family history includes Diabetes in her father; Hypertension in her mother; Kidney disease in her father.  ROS:   Please see the history of present illness.    All other systems reviewed and are negative.   Labs/Other Tests and Data Reviewed:     Recent Labs: 08/19/2018: ALT 7; BUN 12; Creat 0.60; Hemoglobin 13.1; Platelets 301; Potassium 4.0; Sodium 136; TSH 1.36   Recent Lipid Panel Lab Results  Component Value Date/Time   CHOL 180 08/19/2018 10:56 AM   TRIG 53 08/19/2018 10:56 AM   HDL 83 08/19/2018 10:56 AM   CHOLHDL 2.2 08/19/2018 10:56 AM   LDLCALC 84 08/19/2018 10:56 AM    Wt Readings from Last 3 Encounters:  06/30/19 222 lb (100.7 kg)  05/25/19 210 lb (95.3 kg)  04/11/19 222 lb (100.7 kg)     Objective:    Vital Signs:  There were no vitals taken for this visit.   GEN:  alert and oriented  RESPIRATORY:  no shortness of breathin in converstaion PSYCH:  normal affect and mood   ASSESSMENT & PLAN:     1. Upper respiratory tract infection, unspecified type S&S consistent with viral related URI. Symptom management education provided, along with cough syrup and mucinex Additionally put out of work for [place] testing just to rule out.  - promethazine-dextromethorphan (PROMETHAZINE-DM) 6.25-15 MG/5ML syrup; Take 5 mLs by mouth 4 (four) times daily as needed for cough.  Dispense: 240 mL; Refill: 0 - guaiFENesin (MUCINEX) 600 MG 12 hr tablet; Take 1 tablet (600 mg total) by mouth 2 (two) times daily for 7 days.  Dispense: 14 tablet; Refill: 0 - sodium chloride (OCEAN) 0.65 % nasal spray; Place 1 spray into the nose as needed for congestion.  Dispense: 60 mL; Refill: 1  2. Cough Cough is number 1 symptom, will give cough syrup to help with management of this.  Provided with symptom management education in AVS. - promethazine-dextromethorphan (PROMETHAZINE-DM) 6.25-15 MG/5ML syrup; Take 5 mLs by mouth 4 (four) times daily as  needed for cough.  Dispense: 240 mL; Refill: 0  3. Chest congestion Do not feel antibiotics are appropriate at this time.  Is only been 3 to 4 days.  Possibly viral related.  Additional testing for COVID is recommended.  Will prescribe Mucinex to help prevent any chest congestion from excessive nasal drainage from settling in chest.   - guaiFENesin (MUCINEX) 600 MG 12 hr tablet; Take 1 tablet (600 mg total) by mouth 2 (two) times daily for 7 days.  Dispense: 14 tablet; Refill: 0   4. Educated about COVID-19 virus infection  COVID-19 Education: The signs and symptoms of COVID-19 were discussed with the patient and how to seek care for testing (follow up with PCP or arrange E-visit).  The importance of social distancing was discussed today.  Time: 10 minutes Today, I have spent 10  minutes with the patient with telehealth technology discussing the above problems.     Medication Adjustments/Labs and Tests Ordered: Current medicines are reviewed at length with the patient today.  Concerns regarding medicines are outlined above.   Tests Ordered: No orders of the defined types were placed in this encounter.   Medication Changes: Meds ordered this encounter  Medications  . promethazine-dextromethorphan (PROMETHAZINE-DM) 6.25-15 MG/5ML syrup    Sig: Take 5 mLs by mouth 4 (four) times daily as needed for cough.    Dispense:  240 mL    Refill:  0    Order Specific Question:   Supervising Provider    Answer:   SIMPSON, MARGARET E [[id number]]  . guaiFENesin (MUCINEX) 600 MG 12 hr tablet    Sig: Take 1 tablet (600 mg total) by mouth 2 (two) times daily for 7 days.    Dispense:  14 tablet    Refill:  0    Order Specific Question:   Supervising Provider    Answer:   SIMPSON, MARGARET E [[id number]]  . sodium chloride (OCEAN) 0.65 % nasal spray    Sig: Place 1 spray into the nose as needed for congestion.    Dispense:  60 mL    Refill:  1    Order Specific Question:   Supervising Provider    Answer:    Kerri PerchesSIMPSON, MARGARET E [[id number]]    Disposition:  Follow up as needed  Signed, Freddy FinnerHannah M Mills, NP  08/17/2019 11:27 AM     [facility]

## 2019-08-18 ENCOUNTER — Other Ambulatory Visit: Payer: Self-pay

## 2019-08-18 DIAGNOSIS — Z20822 Contact with and (suspected) exposure to covid-19: Secondary | ICD-10-CM

## 2019-08-19 LAB — NOVEL CORONAVIRUS, NAA: SARS-CoV-2, NAA: NOT DETECTED

## 2019-08-22 ENCOUNTER — Telehealth: Payer: Self-pay | Admitting: *Deleted

## 2019-08-22 NOTE — Telephone Encounter (Signed)
Spoke with patient and let her know Jarrett Soho is not in the office today so I can't say if the note can be extended. She will call back tomorrow.

## 2019-08-22 NOTE — Telephone Encounter (Signed)
Pt wanted to know if her work note could be extended? She is not feeling any better and needs to be out a couple more days?

## 2019-08-24 NOTE — Telephone Encounter (Signed)
Called to speak with patient regarding message. No answer. Left vm requesting call back.

## 2019-08-25 ENCOUNTER — Other Ambulatory Visit: Payer: Self-pay

## 2019-08-25 ENCOUNTER — Encounter: Payer: Self-pay | Admitting: Family Medicine

## 2019-08-25 ENCOUNTER — Ambulatory Visit (INDEPENDENT_AMBULATORY_CARE_PROVIDER_SITE_OTHER): Payer: BC Managed Care – PPO | Admitting: Family Medicine

## 2019-08-25 DIAGNOSIS — J019 Acute sinusitis, unspecified: Secondary | ICD-10-CM

## 2019-08-25 DIAGNOSIS — J22 Unspecified acute lower respiratory infection: Secondary | ICD-10-CM | POA: Diagnosis not present

## 2019-08-25 MED ORDER — AMOXICILLIN-POT CLAVULANATE 875-125 MG PO TABS: 1.0000 | ORAL_TABLET | Freq: Two times a day (BID) | ORAL | 0 refills | Status: AC

## 2019-08-25 NOTE — Progress Notes (Signed)
Virtual Visit via Telephone Note   This visit type was conducted due to national recommendations for restrictions regarding the COVID-19 Pandemic (e.g. social distancing) in an effort to limit this patient's exposure and mitigate transmission in our community.  Due to her co-morbid illnesses, this patient is at least at moderate risk for complications without adequate follow up.  This format is felt to be most appropriate for this patient at this time.  The patient did not have access to video technology/had technical difficulties with video requiring transitioning to audio format only (telephone).  All issues noted in this document were discussed and addressed.  No physical exam could be performed with this format.   Evaluation Performed:  Follow-up visit  Date:  08/25/2019   ID:  Stacey Palmer, DOB 01/14/1981, MRN [id number]  Patient Location: Home Provider Location: Office  Location of Patient: Home Location of Provider: Telehealth Consent was obtain for visit to be over via telehealth. I verified that I am speaking with the correct person using two identifiers.  PCP:  Kerri PerchesSimpson, Margaret E, MD   Chief Complaint:   History of Present Illness:    Stacey Palmer is a 38 y.o. female with history of allergies, anxiety, obesity, sleep apnea. 2 weeks of increasing symptoms of sore throat, sneezing, cough with some yellow production for mucus.  Feeling very tired and rundown.  Reports that she tried all treatments provided to her at previous visit 08/17/2019, aside from Mucinex.  Covid test was negative.  She was taken out of work to prevent any possible false negatives.  She reports some fevers and chills at this time.  She did have those before.  Reports headache at times as well.  Uncontrollable runny nose and stuffiness.  Stomachache and nausea secondary to the drainage.  Some body aches.  Denied having any shortness of breath.  Received the flu shot back on October 5.  Denies being around  anybody with similar symptoms.  The patient does not have symptoms concerning for COVID-19 infection (fever, chills, cough, or new shortness of breath).   Past Medical, Surgical, Social History, Allergies, and Medications have been Reviewed.   Past Medical History:  Diagnosis Date  . Allergy   . Anxiety   . Obesity   . Sleep apnea    Past Surgical History:  Procedure Laterality Date  . CESAREAN SECTION    . TUBAL LIGATION       Current Meds  Medication Sig  . acyclovir (ZOVIRAX) 400 MG tablet TAKE 1 TABLET BY MOUTH THREE TIMES DAILY  . benzonatate (TESSALON) 100 MG capsule Take 1 capsule (100 mg total) by mouth 2 (two) times daily as needed for cough.  . betamethasone dipropionate (DIPROLENE) 0.05 % cream Apply topically 2 (two) times daily.  . fluticasone (FLONASE) 50 MCG/ACT nasal spray Place 2 sprays into both nostrils daily.  . montelukast (SINGULAIR) 10 MG tablet Take 1 tablet (10 mg total) by mouth at bedtime.  . Norgestimate-Ethinyl Estradiol Triphasic 0.18/0.215/0.25 MG-25 MCG tab Take 1 tablet by mouth daily.  . ondansetron (ZOFRAN) 4 MG tablet Take 1 tablet (4 mg total) by mouth every 8 (eight) hours as needed for nausea or vomiting.  . phentermine (ADIPEX-P) 37.5 MG tablet Take one half tablet every morning before breakfast  . promethazine-dextromethorphan (PROMETHAZINE-DM) 6.25-15 MG/5ML syrup Take 5 mLs by mouth 4 (four) times daily as needed for cough.  . silver sulfADIAZINE (SILVADENE) 1 % cream Apply to burn once daily x  10 days, then , as needed  . sodium chloride (OCEAN) 0.65 % nasal spray Place 1 spray into the nose as needed for congestion.  Marland Kitchen venlafaxine XR (EFFEXOR-XR) 75 MG 24 hr capsule Take 1 capsule (75 mg total) by mouth daily with breakfast. Dose increase effective 03/24/2019  . Vitamin D, Ergocalciferol, (DRISDOL) 1.25 MG (50000 UT) CAPS capsule TAKE 1 CAPSULE BY MOUTH ONCE A WEEK AS DIRECTED     Allergies:   Shellfish allergy   Social History    Tobacco Use  . Smoking status: Never Smoker  . Smokeless tobacco: Never Used  Substance Use Topics  . Alcohol use: No    Comment: 01-27-2017 per pt occa. Wine  . Drug use: No    Comment: 01-27-2017 per pt no     Family Hx: The patient's family history includes Diabetes in her father; Hypertension in her mother; Kidney disease in her father.  ROS:   Please see the history of present illness.    All other systems reviewed and are negative.   Labs/Other Tests and Data Reviewed:     Recent Labs: No results found for requested labs within last 8760 hours.   Recent Lipid Panel Lab Results  Component Value Date/Time   CHOL 180 08/19/2018 10:56 AM   TRIG 53 08/19/2018 10:56 AM   HDL 83 08/19/2018 10:56 AM   CHOLHDL 2.2 08/19/2018 10:56 AM   LDLCALC 84 08/19/2018 10:56 AM    Wt Readings from Last 3 Encounters:  06/30/19 222 lb (100.7 kg)  05/25/19 210 lb (95.3 kg)  04/11/19 222 lb (100.7 kg)     Objective:    Vital Signs:  There were no vitals taken for this visit.   GEN:  alert and oriented  HENT: nasal tone RESPIRATORY:  no shortness of breathin in conversation, cough noted PSYCH:  normal affect and mood    ASSESSMENT & PLAN:    1. Lower respiratory infection Use Mucinex as previously prescribed.  Possible ongoing settling of mucus in the lungs.  From URI viral related.  Now.  Tomorrow bacterial like infection.  Symptoms have been 2 weeks will prescribe antibiotic at this time.  Advised to continue taking cough syrup and using nasal spray.  And to get Mucinex this time to help get mucus out of her lungs.  Reviewed side effects, risks and benefits of medication.   Patient acknowledged agreement and understanding of the plan.    - amoxicillin-clavulanate (AUGMENTIN) 875-125 MG tablet; Take 1 tablet by mouth 2 (two) times daily for 5 days.  Dispense: 10 tablet; Refill: 0  2. Acute non-recurrent sinusitis, unspecified location Ongoing symptoms of rhinosinusitis.   Some facial discomfort sore throat headaches.  Will provide Augmentin additionally strongly encouraged to get Mucinex to help clear out the sinus area.  - amoxicillin-clavulanate (AUGMENTIN) 875-125 MG tablet; Take 1 tablet by mouth 2 (two) times daily for 5 days.  Dispense: 10 tablet; Refill: 0   Time:   Today, I have spent 10 minutes with the patient with telehealth technology discussing the above problems.     Medication Adjustments/Labs and Tests Ordered: Current medicines are reviewed at length with the patient today.  Concerns regarding medicines are outlined above.   Tests Ordered: No orders of the defined types were placed in this encounter.   Medication Changes: Meds ordered this encounter  Medications  . amoxicillin-clavulanate (AUGMENTIN) 875-125 MG tablet    Sig: Take 1 tablet by mouth 2 (two) times daily for 5 days.  Dispense:  10 tablet    Refill:  0    Order Specific Question:   Supervising Provider    Answer:   Fayrene Helper [[id number]]    Disposition:  Follow up 09/14/2019   Signed, Perlie Mayo, NP  08/25/2019 3:24 PM     [facility]

## 2019-08-25 NOTE — Patient Instructions (Signed)
    I appreciate the opportunity to provide you with the care for your health and wellness. Today we discussed: sinus and chest cold/infection Follow up: 09/14/2019  No labs or referrals today  Out of work till October 29  Immunization of flu vaccine 08-08-2019 at [place]  As suggested for symptom management use Nasal saline spray to help ease congestion and dry nasal passages. Can be used throughout the day as needed. Avoid forceful blowing of nose. It will be common to have some blood if blowing nose a lot or if nose is very dry. Can you a humidifier at home as well. Remember to wash and air it out regularly. Tylenol (325 mg 2 tablets every 6 hours) and or Ibuprofen (200- 400 mg every 8 hours) for fever, sore throat and body aches. Can consider use of a Neti-pot to wash out sinus cavity (twice daily). Please be aware that you need to use distilled or boiled water for these. If congestion is increasing and or coughing can use Mucinex (twice daily) for cough and congestion with full glass of water.  If you have a sore throat warm fluids like hot tea or lemon water can help sooth this.   Please hydrate, rest, get fresh air daily and wash your hands well.  I hope you feel better soon.  Please continue to practice social distancing to keep you, your family, and our community safe.  If you must go out, please wear a Mask and practice good handwashing.  It was a pleasure to see you and I look forward to continuing to work together on your health and well-being. Please do not hesitate to call the office if you need care or have questions about your care.  Have a wonderful day and week. With Gratitude, Cherly Beach, DNP, AGNP-BC

## 2019-08-25 NOTE — Telephone Encounter (Signed)
Patient scheduled another telephone appt.

## 2019-09-06 ENCOUNTER — Other Ambulatory Visit: Payer: Self-pay | Admitting: Family Medicine

## 2019-09-06 DIAGNOSIS — Z1231 Encounter for screening mammogram for malignant neoplasm of breast: Secondary | ICD-10-CM

## 2019-09-14 ENCOUNTER — Telehealth: Payer: BC Managed Care – PPO | Admitting: Family Medicine

## 2019-09-14 ENCOUNTER — Other Ambulatory Visit: Payer: Self-pay

## 2019-09-16 ENCOUNTER — Other Ambulatory Visit: Payer: Self-pay

## 2019-09-16 ENCOUNTER — Telehealth: Payer: Self-pay | Admitting: *Deleted

## 2019-09-16 MED ORDER — VENLAFAXINE HCL ER 75 MG PO CP24: 75.0000 mg | ORAL_CAPSULE | Freq: Every day | ORAL | 3 refills | Status: DC

## 2019-09-16 NOTE — Telephone Encounter (Signed)
Pt needs effexor sent to [facility]

## 2019-09-16 NOTE — Telephone Encounter (Signed)
Medication refilled and sent to requested pharmacy

## 2019-09-23 ENCOUNTER — Telehealth: Payer: BC Managed Care – PPO | Admitting: Family Medicine

## 2019-11-01 ENCOUNTER — Ambulatory Visit: Payer: BC Managed Care – PPO

## 2019-11-04 DIAGNOSIS — Z9289 Personal history of other medical treatment: Secondary | ICD-10-CM

## 2019-11-04 HISTORY — DX: Personal history of other medical treatment: Z92.89

## 2019-11-10 ENCOUNTER — Ambulatory Visit: Payer: BC Managed Care – PPO

## 2019-11-24 ENCOUNTER — Telehealth: Payer: BC Managed Care – PPO | Admitting: Physician Assistant

## 2019-11-24 DIAGNOSIS — J019 Acute sinusitis, unspecified: Secondary | ICD-10-CM

## 2019-11-24 MED ORDER — AMOXICILLIN-POT CLAVULANATE 875-125 MG PO TABS: 1.0000 | ORAL_TABLET | Freq: Two times a day (BID) | ORAL | 0 refills | Status: DC

## 2019-11-24 NOTE — Progress Notes (Signed)
We are sorry that you are not feeling well.  Here is how we plan to help!  Based on what you have shared with me it looks like you may have a sinusitis and or accompanying ear infection.  Sinusitis is inflammation and infection in the sinus cavities of the head.  Based on your presentation I believe you most likely have Acute Bacterial Sinusitis.  This is an infection caused by bacteria and is treated with antibiotics. I have prescribed Augmentin 875mg /125mg  one tablet twice daily with food, for 7 days. You may use an oral decongestant such as Mucinex D or if you have glaucoma or high blood pressure use plain Mucinex. Saline nasal spray help and can safely be used as often as needed for congestion.  If you develop worsening sinus pain, fever or notice severe headache and vision changes, or if symptoms are not better after completion of antibiotic, please schedule an appointment with a health care provider.    Sinus infections are not as easily transmitted as other respiratory infection, however we still recommend that you avoid close contact with loved ones, especially the very young and elderly.  Remember to wash your hands thoroughly throughout the day as this is the number one way to prevent the spread of infection!  Home Care:  Only take medications as instructed by your medical team.  Complete the entire course of an antibiotic.  Do not take these medications with alcohol.  A steam or ultrasonic humidifier can help congestion.  You can place a towel over your head and breathe in the steam from hot water coming from a faucet.  Avoid close contacts especially the very young and the elderly.  Cover your mouth when you cough or sneeze.  Always remember to wash your hands.  Get Help Right Away If:  You develop worsening fever or sinus pain.  You develop a severe head ache or visual changes.  Your symptoms persist after you have completed your treatment plan.  Make sure you  Understand  these instructions.  Will watch your condition.  Will get help right away if you are not doing well or get worse.  Your e-visit answers were reviewed by a board certified advanced clinical practitioner to complete your personal care plan.  Depending on the condition, your plan could have included both over the counter or prescription medications.  If there is a problem please reply  once you have received a response from your provider.  Your safety is important to .  If you have drug allergies check your prescription carefully.    You can use MyChart to ask questions about today's visit, request a non-urgent call back, or ask for a work or school excuse for 24 hours related to this e-Visit. If it has been greater than 24 hours you will need to follow up with your provider, or enter a new e-Visit to address those concerns.  You will get an e-mail in the next two days asking about your experience.  I hope that your e-visit has been valuable and will speed your recovery. Thank you for using e-visits.  Greater than 5 minutes, yet less than 10 minutes of time have been spent researching, coordinating, and implementing care for this patient today

## 2019-11-28 ENCOUNTER — Other Ambulatory Visit: Payer: Self-pay | Admitting: Family Medicine

## 2019-11-30 ENCOUNTER — Ambulatory Visit (INDEPENDENT_AMBULATORY_CARE_PROVIDER_SITE_OTHER): Payer: BC Managed Care – PPO | Admitting: Family Medicine

## 2019-11-30 ENCOUNTER — Other Ambulatory Visit: Payer: Self-pay

## 2019-11-30 ENCOUNTER — Telehealth: Payer: Self-pay | Admitting: *Deleted

## 2019-11-30 ENCOUNTER — Encounter: Payer: Self-pay | Admitting: Family Medicine

## 2019-11-30 DIAGNOSIS — T3695XA Adverse effect of unspecified systemic antibiotic, initial encounter: Secondary | ICD-10-CM | POA: Diagnosis not present

## 2019-11-30 DIAGNOSIS — R519 Headache, unspecified: Secondary | ICD-10-CM | POA: Diagnosis not present

## 2019-11-30 DIAGNOSIS — L509 Urticaria, unspecified: Secondary | ICD-10-CM

## 2019-11-30 DIAGNOSIS — B379 Candidiasis, unspecified: Secondary | ICD-10-CM | POA: Diagnosis not present

## 2019-11-30 NOTE — Telephone Encounter (Signed)
Pt came by the office said she has a ear ache from the antibiotics and she needs something called in as her ear is killing her

## 2019-11-30 NOTE — Assessment & Plan Note (Signed)
Headaches, common with her chronic sinus infections. Is getting COVID tested again to make sure it is not that. Might benefit from ENT referral

## 2019-11-30 NOTE — Progress Notes (Signed)
Virtual Visit via Telephone Note   This visit type was conducted due to national recommendations for restrictions regarding the COVID-19 Pandemic (e.g. social distancing) in an effort to limit this patient's exposure and mitigate transmission in our community.  Due to her co-morbid illnesses, this patient is at least at moderate risk for complications without adequate follow up.  This format is felt to be most appropriate for this patient at this time.  The patient did not have access to video technology/had technical difficulties with video requiring transitioning to audio format only (telephone).  All issues noted in this document were discussed and addressed.  No physical exam could be performed with this format.    Evaluation Performed:  Follow-up visit  Date:  11/30/2019   ID:  Abri, Vacca 1981/04/05, MRN [id number]  Patient Location: Home Provider Location: Office  Location of Patient: Home Location of Provider: Telehealth Consent was obtain for visit to be over via telehealth. I verified that I am speaking with the correct person using two identifiers.  PCP:  Fayrene Helper, MD   Chief Complaint: Vaginitis, headache  History of Present Illness:    Stacey Palmer is a 39 y.o. female with history of allergies, anxiety, obesity, sleep apnea among others.  Presents today for phone visit secondary to being started on antibiotic for earache/sinusitis developed yeast after being on medication for couple days.  Had the pharmacy send in a request for medication which was filled yesterday but she has not picked it up yet.  She also reports that she has some itching and hives after starting antibiotic but she has been on this antibiotic before I personally have given it to her back in October 2020.  She did not have any trouble with that.  She has a headache, fatigue and has felt feverish but no fever.  Work is having her be Covid tested again she reports she has been tested about 6  times per her.  She feels that she does not have Covid but she has to get tested in order to get back to work.  Psych note she has a pretty significant amount of infections over this last year possible ENT referral would be good she is excepting of this if that is something that proves to be of benefit.   The patient does have some symptoms concerning for COVID-19 infection (fever, chills, cough, or new shortness of breath).   Past Medical, Surgical, Social History, Allergies, and Medications have been Reviewed.  Past Medical History:  Diagnosis Date  . Allergy   . Anxiety   . Obesity   . Sleep apnea    Past Surgical History:  Procedure Laterality Date  . CESAREAN SECTION    . TUBAL LIGATION       Current Meds  Medication Sig  . acyclovir (ZOVIRAX) 400 MG tablet TAKE 1 TABLET BY MOUTH THREE TIMES DAILY  . amoxicillin-clavulanate (AUGMENTIN) 875-125 MG tablet Take 1 tablet by mouth 2 (two) times daily. One po bid x 7 days  . benzonatate (TESSALON) 100 MG capsule Take 1 capsule (100 mg total) by mouth 2 (two) times daily as needed for cough.  . betamethasone dipropionate (DIPROLENE) 0.05 % cream Apply topically 2 (two) times daily.  . fluconazole (DIFLUCAN) 150 MG tablet TAKE ONE TABLET BY MOUTH AS A ONE-TIME DOSE  . fluticasone (FLONASE) 50 MCG/ACT nasal spray Place 2 sprays into both nostrils daily.  . montelukast (SINGULAIR) 10 MG tablet Take 1  tablet (10 mg total) by mouth at bedtime.  . Norgestimate-Ethinyl Estradiol Triphasic 0.18/0.215/0.25 MG-25 MCG tab Take 1 tablet by mouth daily.  . ondansetron (ZOFRAN) 4 MG tablet Take 1 tablet (4 mg total) by mouth every 8 (eight) hours as needed for nausea or vomiting.  . phentermine (ADIPEX-P) 37.5 MG tablet Take one half tablet every morning before breakfast  . promethazine-dextromethorphan (PROMETHAZINE-DM) 6.25-15 MG/5ML syrup Take 5 mLs by mouth 4 (four) times daily as needed for cough.  . silver sulfADIAZINE (SILVADENE) 1 % cream  Apply to burn once daily x 10 days, then , as needed  . sodium chloride (OCEAN) 0.65 % nasal spray Place 1 spray into the nose as needed for congestion.  Marland Kitchen venlafaxine XR (EFFEXOR-XR) 75 MG 24 hr capsule Take 1 capsule (75 mg total) by mouth daily with breakfast. Dose increase effective 03/24/2019  . Vitamin D, Ergocalciferol, (DRISDOL) 1.25 MG (50000 UT) CAPS capsule TAKE 1 CAPSULE BY MOUTH ONCE A WEEK AS DIRECTED     Allergies:   Shellfish allergy   ROS:   Please see the history of present illness.    All other systems reviewed and are negative.   Labs/Other Tests and Data Reviewed:    Recent Labs: No results found for requested labs within last 8760 hours.   Recent Lipid Panel Lab Results  Component Value Date/Time   CHOL 180 08/19/2018 10:56 AM   TRIG 53 08/19/2018 10:56 AM   HDL 83 08/19/2018 10:56 AM   CHOLHDL 2.2 08/19/2018 10:56 AM   LDLCALC 84 08/19/2018 10:56 AM    Wt Readings from Last 3 Encounters:  06/30/19 222 lb (100.7 kg)  05/25/19 210 lb (95.3 kg)  04/11/19 222 lb (100.7 kg)     Objective:    Vital Signs:  There were no vitals taken for this visit.   GEN:  alert and oriented RESPIRATORY:  no shortness of breath noted, nasal tone in conversation noted PSYCH:  normal affect and mood  ASSESSMENT & PLAN:    1. Antibiotic-induced yeast infection  2. Hives  3. Acute nonintractable headache, unspecified headache type   Time:   Today, I have spent 10 minutes with the patient with telehealth technology discussing the above problems.     Medication Adjustments/Labs and Tests Ordered: Current medicines are reviewed at length with the patient today.  Concerns regarding medicines are outlined above.   Tests Ordered: No orders of the defined types were placed in this encounter.   Medication Changes: No orders of the defined types were placed in this encounter.   Disposition:  Follow up PRN  Signed, Freddy Finner, NP  11/30/2019 8:32 AM      [facility]

## 2019-11-30 NOTE — Assessment & Plan Note (Signed)
Current use of Augmentin, developed yeast infection, Dr Lodema Hong already sent in Diflucan yesterday. She is advised to get it and take as directed.

## 2019-11-30 NOTE — Assessment & Plan Note (Signed)
Has had augmentin in the past, unsure what is causing hives this time. Advised to take benadryl. She denies having breathing trouble.

## 2019-11-30 NOTE — Telephone Encounter (Signed)
Dahlia Client doing a tele visit with patient

## 2019-11-30 NOTE — Patient Instructions (Signed)
Happy New Year! May you have a year filled with hope, love, happiness and laughter.  I appreciate the opportunity to provide you with care for your health and wellness. Today we discussed: yeast infection, recurrent sinus issues, hives  Follow up: as needed  No labs or referrals today  Please continue to practice social distancing to keep you, your family, and our community safe.  If you must go out, please wear a mask and practice good handwashing.  It was a pleasure to see you and I look forward to continuing to work together on your health and well-being. Please do not hesitate to call the office if you need care or have questions about your care.  Have a wonderful day and week. With Gratitude, Tereasa Coop, DNP, AGNP-BC

## 2019-12-01 ENCOUNTER — Telehealth: Payer: BC Managed Care – PPO | Admitting: Physician Assistant

## 2019-12-01 DIAGNOSIS — R059 Cough, unspecified: Secondary | ICD-10-CM

## 2019-12-01 DIAGNOSIS — R05 Cough: Secondary | ICD-10-CM

## 2019-12-01 DIAGNOSIS — J349 Unspecified disorder of nose and nasal sinuses: Secondary | ICD-10-CM

## 2019-12-01 NOTE — Progress Notes (Signed)
Based on what you shared with me, I feel your condition warrants further evaluation and I recommend that you be seen for a face to face office visit.  I see that you have already been started on antibiotics without improvement and had a virtual visits with your PCP tomorrow. At this point, I highly recommend in person evaluation at an urgent care or at your PCP's office so that someone can examine your ears, nose, and throat and make recommendations for the best medications to treat your symptoms. Unfortunately I cannot do this virtually. I hope you feel better soon!    NOTE: If you entered your credit card information for this eVisit, you will not be charged. You may see a "hold" on your card for the $35 but that hold will drop off and you will not have a charge processed.   If you are having a true medical emergency please call 911.      For an urgent face to face visit, [place] has five urgent care centers for your convenience:      NEW:  [facility] at [facility] Directions [phone] [address] [place], [place] [address] . 10 am - 6pm Monday - Friday    [facility] Get Driving Directions [phone] [address] [place], [place] [address] . 10 am to 8 pm Monday-Friday . 12 pm to 8 pm [facility] Urgent Care at [facility] Get Driving Directions [phone] [address] [place], [place] [address] . 8 am to 8 pm Monday-Friday . 9 am to 6 pm Saturday . 11 am to 6 pm Sunday     [facility] at [facility] Get Driving Directions  [phone] [address] [place], [place] [address] . 8 am to 8 pm Monday-Friday . 8 am to 4 pm [facility]gent Care at [facility] Directions [phone] [address] [place], [place] [address] . 12 pm to 6 pm Monday-Friday      Your  e-visit answers were reviewed by a board certified advanced clinical practitioner to complete your personal care plan.  Thank you for using e-Visits.   Greater than 5 minutes, yet less than 10 minutes of time have been spent researching, coordinating, and implementing care for this patient today.

## 2019-12-02 ENCOUNTER — Telehealth: Payer: Self-pay | Admitting: *Deleted

## 2019-12-02 NOTE — Telephone Encounter (Signed)
Pt called insisting she be seen in the office as she had to be seen before she went back to work. I told her we did not have any covid results yet so we wouldn't be able to provide her with that and that she did the visit with Dahlia Client last week so as soon as we had a negative result we could give her documentation. She stated she would still need to come in the office to be seen. I asked where covid testing was done she said [facility] and [facility]. I told her I did not see testing for [facility] but if she had a negative result from [facility] to have them fax it to [place] and provided fax number. I let her know once we receive this documentation we would call her to provide her with work documentation.

## 2019-12-06 ENCOUNTER — Encounter: Payer: Self-pay | Admitting: Family Medicine

## 2019-12-06 ENCOUNTER — Ambulatory Visit (INDEPENDENT_AMBULATORY_CARE_PROVIDER_SITE_OTHER): Payer: BC Managed Care – PPO | Admitting: Family Medicine

## 2019-12-06 ENCOUNTER — Other Ambulatory Visit: Payer: Self-pay

## 2019-12-06 VITALS — BP 118/70 | HR 73 | Temp 97.4°F | Resp 15 | Ht 68.0 in | Wt 235.1 lb

## 2019-12-06 DIAGNOSIS — E559 Vitamin D deficiency, unspecified: Secondary | ICD-10-CM | POA: Diagnosis not present

## 2019-12-06 DIAGNOSIS — R197 Diarrhea, unspecified: Secondary | ICD-10-CM

## 2019-12-06 DIAGNOSIS — J349 Unspecified disorder of nose and nasal sinuses: Secondary | ICD-10-CM | POA: Insufficient documentation

## 2019-12-06 DIAGNOSIS — L659 Nonscarring hair loss, unspecified: Secondary | ICD-10-CM | POA: Diagnosis not present

## 2019-12-06 DIAGNOSIS — J3089 Other allergic rhinitis: Secondary | ICD-10-CM | POA: Insufficient documentation

## 2019-12-06 DIAGNOSIS — R5383 Other fatigue: Secondary | ICD-10-CM | POA: Insufficient documentation

## 2019-12-06 DIAGNOSIS — K219 Gastro-esophageal reflux disease without esophagitis: Secondary | ICD-10-CM

## 2019-12-06 DIAGNOSIS — T7840XA Allergy, unspecified, initial encounter: Secondary | ICD-10-CM

## 2019-12-06 HISTORY — DX: Nonscarring hair loss, unspecified: L65.9

## 2019-12-06 HISTORY — DX: Other fatigue: R53.83

## 2019-12-06 MED ORDER — LEVOCETIRIZINE DIHYDROCHLORIDE 5 MG PO TABS: 5.0000 mg | ORAL_TABLET | Freq: Every evening | ORAL | 1 refills | Status: DC

## 2019-12-06 MED ORDER — OMEPRAZOLE 20 MG PO CPDR: 20.0000 mg | DELAYED_RELEASE_CAPSULE | Freq: Every day | ORAL | 3 refills | Status: DC

## 2019-12-06 NOTE — Assessment & Plan Note (Signed)
She reports having fatigue ongoing for several weeks now.  Has had multiple sinus/allergy-like symptoms.  Possible need for treatment for ongoing allergies and acid reflux in addition to getting an updated set of labs to make sure that is not thyroid, anemia or vitamin D related.Patient acknowledged agreement and understanding of the plan.

## 2019-12-06 NOTE — Assessment & Plan Note (Signed)
She has been treated for heartburn in the past./GERD.  Will start omeprazole to see if this will help relieve some of her symptoms.

## 2019-12-06 NOTE — Assessment & Plan Note (Signed)
Does have history of having anxiety reports having some hair loss.  Will be checking TSH to make sure that thyroid is stable.

## 2019-12-06 NOTE — Assessment & Plan Note (Signed)
She is encouraged to make sure that she is eating a good diet and staying hydrated.  Advised to get probiotics to start taking daily for next 3 days.  Can use Pepto or Imodium as needed for diarrhea.  Recent Covid negative test.

## 2019-12-06 NOTE — Patient Instructions (Addendum)
Happy New Year! May you have a year filled with hope, love, happiness and laughter.  I appreciate the opportunity to provide you with care for your health and wellness. Today we discussed: acid reflux, allergies,    Follow up: 4 weeks by phone   Labs today  Pick up medications takes as directed and probiotics, imodium or Pepto   Remember to take your omeprazole on empty stomach 1 hour before eating or taking other medications.  Avoid lay down within 3 hours of eating dinner or a meal. Try to avoid acidic foods that can flare up reflux.   HYDRATE AND GET VITAMIN C (500 mg), Zinc check vitamin at home.   I have attached information on heartburn and allergies.  Please continue to practice social distancing to keep you, your family, and our community safe.  If you must go out, please wear a mask and practice good handwashing.  It was a pleasure to see you and I look forward to continuing to work together on your health and well-being. Please do not hesitate to call the office if you need care or have questions about your care.  Have a wonderful day and week. With Gratitude, Cherly Beach, DNP, AGNP-BC    Food Choices for Gastroesophageal Reflux Disease, Adult When you have gastroesophageal reflux disease (GERD), the foods you eat and your eating habits are very important. Choosing the right foods can help ease your discomfort. Think about working with a nutrition specialist (dietitian) to help you make good choices. What are tips for following this plan?  Meals  Choose healthy foods that are low in fat, such as fruits, vegetables, whole grains, low-fat dairy products, and lean meat, fish, and poultry.  Eat small meals often instead of 3 large meals a day. Eat your meals slowly, and in a place where you are relaxed. Avoid bending over or lying down until 2-3 hours after eating.  Avoid eating meals 2-3 hours before bed.  Avoid drinking a lot of liquid with meals.  Cook foods  using methods other than frying. Bake, grill, or broil food instead.  Avoid or limit: ? Chocolate. ? Peppermint or spearmint. ? Alcohol. ? Pepper. ? Black and decaffeinated coffee. ? Black and decaffeinated tea. ? Bubbly (carbonated) soft drinks. ? Caffeinated energy drinks and soft drinks.  Limit high-fat foods such as: ? Fatty meat or fried foods. ? Whole milk, cream, butter, or ice cream. ? Nuts and nut butters. ? Pastries, donuts, and sweets made with butter or shortening.  Avoid foods that cause symptoms. These foods may be different for everyone. Common foods that cause symptoms include: ? Tomatoes. ? Oranges, lemons, and limes. ? Peppers. ? Spicy food. ? Onions and garlic. ? Vinegar. Lifestyle  Maintain a healthy weight. Ask your doctor what weight is healthy for you. If you need to lose weight, work with your doctor to do so safely.  Exercise for at least 30 minutes for 5 or more days each week, or as told by your doctor.  Wear loose-fitting clothes.  Do not smoke. If you need help quitting, ask your doctor.  Sleep with the head of your bed higher than your feet. Use a wedge under the mattress or blocks under the bed frame to raise the head of the bed. Summary  When you have gastroesophageal reflux disease (GERD), food and lifestyle choices are very important in easing your symptoms.  Eat small meals often instead of 3 large meals a day. Eat your meals slowly,  and in a place where you are relaxed.  Limit high-fat foods such as fatty meat or fried foods.  Avoid bending over or lying down until 2-3 hours after eating.  Avoid peppermint and spearmint, caffeine, alcohol, and chocolate. This information is not intended to replace advice given to you by your health care provider. Make sure you discuss any questions you have with your health care provider. Document Revised: 02/10/2019 Document Reviewed: 11/25/2016 Elsevier Patient Education  2020 [employer].   Allergies can cause a lot of symptoms: watery, itching eyes, runny nose (clear), sneezing, sinus pressure, and headaches. This is not making you contagious to others. You can not spread to others or catch this from others. These symptoms happen after you have been exposed to something that you are allergic to an allergen. Prevention: The best prevention is to avoid the things that you know you are allergic to, for example smoke (cigarette, cigar, wood); pollens and molds; animal dander; dust mites. And indoor inhalants such as cleaning products or aerosol sprays.  Target your bedroom as allergy free by removing carpets, damp mopping floors weekly, hanging washable curtains instead of blinds, removing books and stuffed animals, using foam pillows, and encasing pillows and mattress in plastic. Do not blow your nose too frequently or too hard. It may cause your eardrum to perforate (tear). Blow through both nostrils at the same time to equalize pressure.  Use tissue when you blow your nose. Dispose of them and then wash your hands. If no tissue is available, do the "elbow sneeze" into the bend of your arm (away from your open hands). Always wash your hands.  If able use the [facility] in the house and car to reduce exposure to pollens. Use an air filtration system in your house or buy a small one for your bedroom. Dust your house often, using a cloth and cleaner or polish that keeps the dust from flying into the air. Allergy testing can be done if you have had allergies for a long time and are not doing well on current treatments. Take your medications as directed. If you find the medications are not working let your healthcare provider know. It might take more than one medication to control allergies, especially, seasonal ones.

## 2019-12-06 NOTE — Assessment & Plan Note (Signed)
Reports taking her Singulair as directed.  Reports that the Flonase dries her eyes out.  Advised her to start a daily allergy medication to see if that will help keep sinuses in control.  Possible need for referral to ENT if this does not help.  In addition to keeping acid reflux under control will be starting omeprazole to make sure that that is not flaring up sinus issues.

## 2019-12-06 NOTE — Progress Notes (Addendum)
Subjective:  Patient ID: Stacey Palmer, female    DOB: 03-22-1981  Age: 39 y.o. MRN: [id number]  CC:  Chief Complaint  Patient presents with  . sinus congestion    x 2 weeks  . Diarrhea    this morning  . Fatigue    x few weeks      HPI  HPI  Stacey Palmer is a 39 year old female patient who has an extensive history of anxiety, allergy, obesity, sleep apnea, recurrent sinus infections. who has presented several times over the last month to ED visits and in office visits secondary to complaints of cough, runny nose, sinus drainage pressure questionable sinusitis/ear infection.  Over the last year she has had around 5 or more sinus-like infections.  Reports that she uses her Flonase but that dries her as well.  Reports that she takes her Singulair as prescribed.  Is not take anything else for allergies.  Complains today of having a bitter taste in the back of her mouth when she woke up.  Does not have heartburn sensations but reports that she does lay down sometimes close to eating.  She reports some diarrhea this morning had not had any in the last couple days has not used anything over-the-counter for this.  Has had several courses of antibiotics in the last several months.  Is not being treated for acid reflux at this time, does have it in history. And has not been as adherent to allergies treatment.  Today patient denies signs and symptoms of COVID 19 infection including fever, chills, cough, shortness of breath, and headache. Past Medical, Surgical, Social History, Allergies, and Medications have been Reviewed.   Past Medical History:  Diagnosis Date  . Allergy   . Anxiety   . Obesity   . Sleep apnea     Current Meds  Medication Sig  . acyclovir (ZOVIRAX) 400 MG tablet TAKE 1 TABLET BY MOUTH THREE TIMES DAILY  . benzonatate (TESSALON) 100 MG capsule Take 1 capsule (100 mg total) by mouth 2 (two) times daily as needed for cough.  . betamethasone dipropionate (DIPROLENE)  0.05 % cream Apply topically 2 (two) times daily.  . fluticasone (FLONASE) 50 MCG/ACT nasal spray Place 2 sprays into both nostrils daily.  . montelukast (SINGULAIR) 10 MG tablet Take 1 tablet (10 mg total) by mouth at bedtime.  . Norgestimate-Ethinyl Estradiol Triphasic 0.18/0.215/0.25 MG-25 MCG tab Take 1 tablet by mouth daily.  . ondansetron (ZOFRAN) 4 MG tablet Take 1 tablet (4 mg total) by mouth every 8 (eight) hours as needed for nausea or vomiting.  . phentermine (ADIPEX-P) 37.5 MG tablet Take one half tablet every morning before breakfast  . promethazine-dextromethorphan (PROMETHAZINE-DM) 6.25-15 MG/5ML syrup Take 5 mLs by mouth 4 (four) times daily as needed for cough.  . silver sulfADIAZINE (SILVADENE) 1 % cream Apply to burn once daily x 10 days, then , as needed  . sodium chloride (OCEAN) 0.65 % nasal spray Place 1 spray into the nose as needed for congestion.  Marland Kitchen venlafaxine XR (EFFEXOR-XR) 75 MG 24 hr capsule Take 1 capsule (75 mg total) by mouth daily with breakfast. Dose increase effective 03/24/2019  . Vitamin D, Ergocalciferol, (DRISDOL) 1.25 MG (50000 UT) CAPS capsule TAKE 1 CAPSULE BY MOUTH ONCE A WEEK AS DIRECTED    ROS:  Review of Systems  Constitutional: Positive for malaise/fatigue.  HENT: Positive for congestion, ear pain and sinus pain.   Eyes: Negative.   Respiratory: Positive for cough.  Cardiovascular: Negative.  Negative for chest pain.  Gastrointestinal: Positive for diarrhea.       Bitter/bad taste in mouth   Genitourinary: Negative.   Musculoskeletal: Negative.   Skin: Negative.   Neurological: Positive for headaches.  Endo/Heme/Allergies: Negative.   Psychiatric/Behavioral: Negative.   All other systems reviewed and are negative.    Objective:   Today's Vitals: BP 118/70   Pulse 73   Temp (!) 97.4 F (36.3 C) (Temporal)   Resp 15   Ht 5\' 8"  (1.727 m)   Wt 235 lb 1.3 oz (106.6 kg)   SpO2 98%   BMI 35.74 kg/m  Vitals with BMI 12/06/2019  06/30/2019 05/25/2019  Height 5\' 8"  5\' 9"  5\' 9"   Weight 235 lbs 1 oz 222 lbs 210 lbs  BMI 35.75 32.77 31  Systolic 118 108 -  Diastolic 70 78 -  Pulse 73 - -  Some encounter information is confidential and restricted. Go to Review Flowsheets activity to see all data.     Physical Exam Vitals and nursing note reviewed.  Constitutional:      Appearance: Normal appearance. She is well-developed and well-groomed. She is obese.  HENT:     Head: Normocephalic and atraumatic.     Right Ear: Hearing, tympanic membrane, ear canal and external ear normal.     Left Ear: Hearing, tympanic membrane, ear canal and external ear normal.     Nose: Nose normal.     Mouth/Throat:     Lips: Pink.     Mouth: Mucous membranes are moist.     Pharynx: Oropharynx is clear. Uvula midline. Posterior oropharyngeal erythema present.  Eyes:     General:        Right eye: No discharge.        Left eye: No discharge.     Conjunctiva/sclera: Conjunctivae normal.  Cardiovascular:     Rate and Rhythm: Normal rate and regular rhythm.     Pulses: Normal pulses.     Heart sounds: Normal heart sounds.  Pulmonary:     Effort: Pulmonary effort is normal.     Breath sounds: Normal breath sounds.  Musculoskeletal:        General: Normal range of motion.     Cervical back: Normal range of motion and neck supple.  Skin:    General: Skin is warm.  Neurological:     General: No focal deficit present.     Mental Status: She is alert and oriented to person, place, and time.  Psychiatric:        Attention and Perception: Attention and perception normal.        Mood and Affect: Mood and affect normal.        Speech: Speech normal.        Behavior: Behavior normal. Behavior is cooperative.        Thought Content: Thought content normal.        Cognition and Memory: Cognition and memory normal.        Judgment: Judgment normal.     Assessment   1. Sinus problem   2. Fatigue, unspecified type   3. Vitamin D  deficiency   4. Hair loss   5. Gastroesophageal reflux disease, unspecified whether esophagitis present   6. Allergy, initial encounter   7. Diarrhea, unspecified type     Tests ordered Orders Placed This Encounter  Procedures  . CBC with Differential/Platelet  . COMPLETE METABOLIC PANEL WITH GFR  . TSH  . VITAMIN D  25 Hydroxy (Vit-D Deficiency, Fractures)   Plan: Please see assessment and plan per problem list above.   Meds ordered this encounter  Medications  . levocetirizine (XYZAL) 5 MG tablet    Sig: Take 1 tablet (5 mg total) by mouth every evening.    Dispense:  30 tablet    Refill:  1    Order Specific Question:   Supervising Provider    Answer:   SIMPSON, MARGARET E [[id number]]  . omeprazole (PRILOSEC) 20 MG capsule    Sig: Take 1 capsule (20 mg total) by mouth daily.    Dispense:  30 capsule    Refill:  3    Order Specific Question:   Supervising Provider    Answer:   Genia Harold    Patient to follow-up in 01/03/2020   Freddy Finner, NP

## 2019-12-06 NOTE — Assessment & Plan Note (Signed)
Has not been taking her supplements since August.  Will get a recheck of vitamin D to see if she needs to be back on supplement.  Advised for her to not stop any treatment medications without consulting with Dr. Lodema Hong or I first.Patient acknowledged agreement and understanding of the plan.

## 2019-12-06 NOTE — Assessment & Plan Note (Signed)
Has had multiple sinus/allergy-like symptoms.  Possible need for treatment for ongoing allergies and acid reflux.  Additionally might need to get referral to ENT if treatment for allergies and reflux do not help.  Patient acknowledged agreement and understanding of the plan.

## 2019-12-16 ENCOUNTER — Ambulatory Visit: Payer: Self-pay

## 2019-12-17 ENCOUNTER — Ambulatory Visit: Payer: Self-pay

## 2019-12-21 ENCOUNTER — Ambulatory Visit: Payer: BC Managed Care – PPO

## 2020-01-03 ENCOUNTER — Ambulatory Visit (INDEPENDENT_AMBULATORY_CARE_PROVIDER_SITE_OTHER): Payer: BC Managed Care – PPO | Admitting: Family Medicine

## 2020-01-03 ENCOUNTER — Encounter: Payer: Self-pay | Admitting: Family Medicine

## 2020-01-03 ENCOUNTER — Other Ambulatory Visit: Payer: Self-pay

## 2020-01-03 VITALS — BP 120/80 | HR 70 | Ht 68.0 in | Wt 250.0 lb

## 2020-01-03 DIAGNOSIS — F324 Major depressive disorder, single episode, in partial remission: Secondary | ICD-10-CM

## 2020-01-03 DIAGNOSIS — Z6838 Body mass index (BMI) 38.0-38.9, adult: Secondary | ICD-10-CM | POA: Diagnosis not present

## 2020-01-03 DIAGNOSIS — J309 Allergic rhinitis, unspecified: Secondary | ICD-10-CM | POA: Diagnosis not present

## 2020-01-03 DIAGNOSIS — E669 Obesity, unspecified: Secondary | ICD-10-CM | POA: Diagnosis not present

## 2020-01-03 MED ORDER — PHENTERMINE HCL 37.5 MG PO TABS: 37.5000 mg | ORAL_TABLET | Freq: Every day | ORAL | 3 refills | Status: DC

## 2020-01-03 NOTE — Assessment & Plan Note (Signed)
Controlled, no change in medication  

## 2020-01-03 NOTE — Progress Notes (Signed)
  Virtual Visit via Telephone Note  I connected with Stacey Palmer on 01/03/20 at  8:20 AM EST by telephone and verified that I am speaking with the correct person using two identifiers.  Location: Patient: home Provider: office   I discussed the limitations, risks, security and privacy concerns of performing an evaluation and management service by telephone and the availability of in person appointments. I also discussed with the patient that there may be a patient responsible charge related to this service. The patient expressed understanding and agreed to proceed.   History of Present Illness:   F/U chronic problems, medication review, and refill medication when necessary. Review most recent labs and order labs which are due Review preventive health and update with necessary referrals or immunizations as indicated Denies recent fever or chills. Denies sinus pressure, nasal congestion, ear pain or sore throat. Denies chest congestion, productive cough or wheezing. Denies chest pains, palpitations and leg swelling Denies abdominal pain, nausea, vomiting,diarrhea or constipation.   Denies dysuria, frequency, hesitancy or incontinence. Denies joint pain, swelling and limitation in mobility. Denies headaches, seizures, numbness, or tingling. Denies uncontrolled  depression, anxiety or insomnia. Denies skin break down or rash. C/o weight gain and wants help with phentermine. With new job, her level of stress is reduced  , improved work benefits anticipated     Observations/Objective:  BP 120/80   Pulse 70   Ht 5\' 8"  (1.727 m)   Wt 250 lb (113.4 kg)   BMI 38.01 kg/m   Good communication with no confusion and intact memory. Alert and oriented x 3 No signs of respiratory distress during speech    Assessment and Plan:  Depression, major, single episode, in partial remission (HCC) Controlled, no change in medication   Allergies Controlled, no change in  medication   Allergic rhinitis Controlled, no change in medication   Obesity (BMI 30.0-34.9)  Patient re-educated about  the importance of commitment to a  minimum of 150 minutes of exercise per week as able.  The importance of healthy food choices with portion control discussed, as well as eating regularly and within a 12 hour window most days. The need to choose "clean , green" food 50 to 75% of the time is discussed, as well as to make water the primary drink and set a goal of 64 ounces water daily.    Weight /BMI 01/03/2020 12/06/2019 06/30/2019  WEIGHT 250 lb 235 lb 1.3 oz 222 lb  HEIGHT 5\' 8"  5\' 8"  5\' 9"   BMI 38.01 kg/m2 35.74 kg/m2 32.78 kg/m2  Some encounter information is confidential and restricted. Go to Review Flowsheets activity to see all data.       Follow Up Instructions:    I discussed the assessment and treatment plan with the patient. The patient was provided an opportunity to ask questions and all were answered. The patient agreed with the plan and demonstrated an understanding of the instructions.   The patient was advised to call back or seek an in-person evaluation if the symptoms worsen or if the condition fails to improve as anticipated.  I provided 15 minutes of non-face-to-face time during this encounter.   07/02/2019, MD

## 2020-01-03 NOTE — Patient Instructions (Signed)
F/U in 4 months, in office with MD, call if you need me before   Medication s as reviewed, please take all as directed  Weight loss goal of 16 pounds, with careful food choice and eating on schedule , you can do this!  New is phentermine HALF daily  Think about what you will eat, plan ahead. Choose " clean, green, fresh or frozen" over canned, processed or packaged foods which are more sugary, salty and fatty. 70 to 75% of food eaten should be vegetables and fruit. Three meals at set times with snacks allowed between meals, but they must be fruit or vegetables. Aim to eat over a 12 hour period , example 7 am to 7 pm, and STOP after  your last meal of the day. Drink water,generally about 64 ounces per day, no other drink is as healthy. Fruit juice is best enjoyed in a healthy way, by EATING the fruit.  It is important that you exercise regularly at least 30 minutes 5 times a week. If you develop chest pain, have severe difficulty breathing, or feel very tired, stop exercising immediately and seek medical attention    Thanks for choosing [facility], we consider it a privelige to serve you.

## 2020-01-03 NOTE — Assessment & Plan Note (Signed)
  Patient re-educated about  the importance of commitment to a  minimum of 150 minutes of exercise per week as able.  The importance of healthy food choices with portion control discussed, as well as eating regularly and within a 12 hour window most days. The need to choose "clean , green" food 50 to 75% of the time is discussed, as well as to make water the primary drink and set a goal of 64 ounces water daily.    Weight /BMI 01/03/2020 12/06/2019 06/30/2019  WEIGHT 250 lb 235 lb 1.3 oz 222 lb  HEIGHT 5\' 8"  5\' 8"  5\' 9"   BMI 38.01 kg/m2 35.74 kg/m2 32.78 kg/m2  Some encounter information is confidential and restricted. Go to Review Flowsheets activity to see all data.

## 2020-01-27 ENCOUNTER — Ambulatory Visit: Payer: Medicaid Other

## 2020-03-01 ENCOUNTER — Ambulatory Visit: Payer: BC Managed Care – PPO | Admitting: Family Medicine

## 2020-03-07 ENCOUNTER — Ambulatory Visit: Payer: Medicaid Other

## 2020-03-08 ENCOUNTER — Ambulatory Visit
Admission: RE | Admit: 2020-03-08 | Discharge: 2020-03-08 | Disposition: A | Discharge status: Home / Self Care | Payer: 59 | Source: Ambulatory Visit | Attending: Family Medicine | Admitting: Family Medicine

## 2020-03-08 ENCOUNTER — Other Ambulatory Visit: Payer: Self-pay

## 2020-03-08 DIAGNOSIS — Z1231 Encounter for screening mammogram for malignant neoplasm of breast: Secondary | ICD-10-CM

## 2020-04-07 ENCOUNTER — Other Ambulatory Visit: Payer: Self-pay

## 2020-04-07 ENCOUNTER — Emergency Department (HOSPITAL_COMMUNITY)
Admission: EM | Admit: 2020-04-07 | Discharge: 2020-04-07 | Disposition: A | Discharge status: Home / Self Care | Payer: 59 | Attending: Emergency Medicine | Admitting: Emergency Medicine

## 2020-04-07 ENCOUNTER — Emergency Department (HOSPITAL_COMMUNITY): Payer: 59

## 2020-04-07 ENCOUNTER — Encounter (HOSPITAL_COMMUNITY): Payer: Self-pay | Admitting: Emergency Medicine

## 2020-04-07 DIAGNOSIS — Z79899 Other long term (current) drug therapy: Secondary | ICD-10-CM | POA: Insufficient documentation

## 2020-04-07 DIAGNOSIS — R109 Unspecified abdominal pain: Secondary | ICD-10-CM | POA: Diagnosis not present

## 2020-04-07 DIAGNOSIS — R1031 Right lower quadrant pain: Secondary | ICD-10-CM | POA: Insufficient documentation

## 2020-04-07 DIAGNOSIS — R1011 Right upper quadrant pain: Secondary | ICD-10-CM | POA: Diagnosis present

## 2020-04-07 DIAGNOSIS — R300 Dysuria: Secondary | ICD-10-CM | POA: Diagnosis not present

## 2020-04-07 DIAGNOSIS — R110 Nausea: Secondary | ICD-10-CM | POA: Insufficient documentation

## 2020-04-07 LAB — COMPREHENSIVE METABOLIC PANEL
ALT: 11 U/L (ref 0–44)
AST: 14 U/L — ABNORMAL LOW (ref 15–41)
Albumin: 3.5 g/dL (ref 3.5–5.0)
Alkaline Phosphatase: 46 U/L (ref 38–126)
Anion gap: 8 (ref 5–15)
BUN: 10 mg/dL (ref 6–20)
CO2: 24 mmol/L (ref 22–32)
Calcium: 9.1 mg/dL (ref 8.9–10.3)
Chloride: 106 mmol/L (ref 98–111)
Creatinine, Ser: 0.78 mg/dL (ref 0.44–1.00)
GFR calc Af Amer: >60 mL/min (ref >60)
GFR calc non Af Amer: >60 mL/min (ref >60)
Glucose, Bld: 93 mg/dL (ref 70–99)
Potassium: 3.8 mmol/L (ref 3.5–5.1)
Sodium: 138 mmol/L (ref 135–145)
Total Bilirubin: 0.8 mg/dL (ref 0.3–1.2)
Total Protein: 7.1 g/dL (ref 6.5–8.1)

## 2020-04-07 LAB — URINALYSIS, ROUTINE W REFLEX MICROSCOPIC
Bilirubin Urine: NEGATIVE
Glucose, UA: NEGATIVE mg/dL
Hgb urine dipstick: NEGATIVE
Ketones, ur: NEGATIVE mg/dL
Leukocytes,Ua: NEGATIVE
Nitrite: NEGATIVE
Protein, ur: NEGATIVE mg/dL
Specific Gravity, Urine: 1.021 (ref 1.005–1.030)
pH: 6 (ref 5.0–8.0)

## 2020-04-07 LAB — CBC
HCT: 40.2 % (ref 36.0–46.0)
Hemoglobin: 13.3 g/dL (ref 12.0–15.0)
MCH: 31.6 pg (ref 26.0–34.0)
MCHC: 33.1 g/dL (ref 30.0–36.0)
MCV: 95.5 fL (ref 80.0–100.0)
Platelets: 290 K/uL (ref 150–400)
RBC: 4.21 MIL/uL (ref 3.87–5.11)
RDW: 12 % (ref 11.5–15.5)
WBC: 4.1 K/uL (ref 4.0–10.5)
nRBC: 0 % (ref 0.0–0.2)

## 2020-04-07 LAB — PREGNANCY, URINE: Preg Test, Ur: NEGATIVE

## 2020-04-07 MED ORDER — SODIUM CHLORIDE 0.9 % IV BOLUS
1000.0000 mL | Freq: Once | INTRAVENOUS | Status: AC
  Administered 2020-04-07: 1000 mL via INTRAVENOUS

## 2020-04-07 MED ORDER — MORPHINE SULFATE (PF) 4 MG/ML IV SOLN
4.0000 mg | Freq: Once | INTRAVENOUS | Status: AC
  Administered 2020-04-07: 4 mg via INTRAVENOUS
  Filled 2020-04-07: qty 1

## 2020-04-07 MED ORDER — NAPROXEN 500 MG PO TABS: 500.0000 mg | ORAL_TABLET | Freq: Two times a day (BID) | ORAL | 0 refills | Status: DC

## 2020-04-07 MED ORDER — METHOCARBAMOL 500 MG PO TABS
500.0000 mg | ORAL_TABLET | Freq: Two times a day (BID) | ORAL | 0 refills | Status: DC
Start: 2020-04-07 — End: 2020-05-24

## 2020-04-07 MED ORDER — IOHEXOL 300 MG/ML  SOLN
100.0000 mL | Freq: Once | INTRAMUSCULAR | Status: AC | PRN
  Administered 2020-04-07: 100 mL via INTRAVENOUS

## 2020-04-07 MED ORDER — ONDANSETRON HCL 4 MG/2ML IJ SOLN
4.0000 mg | Freq: Once | INTRAMUSCULAR | Status: AC
  Administered 2020-04-07: 4 mg via INTRAVENOUS
  Filled 2020-04-07: qty 2

## 2020-04-07 NOTE — ED Provider Notes (Signed)
[facility] EMERGENCY DEPARTMENT Provider Note   CSN: [id number] Arrival date & time: 04/07/20  9833   History Chief Complaint  Patient presents with   Flank Pain   Stacey Palmer is a 39 y.o. female with past medical history significant for allergy, anxiety who presents for evaluation of right-sided abdominal pain.  Has had intermittent abdominal pain times weeks.  Not associated with food intake.  Patient states this is located to her right upper quadrant however when she points to this area is actually located to her right lower quadrant and right flank.  Has not take anything for this.  Also worse with movement and laying on her right side.  No recent injury or trauma.  Had one episode of burning with urination earlier today and nausea worse on taking thing for symptoms.  Denies concerns for STDs, pelvic pain or vaginal discharge.  Denies fever, chills, chest pain, shortness of breath, hemoptysis, diarrhea, dysuria, hematuria, weakness.  Denies additional rating or alleviating factors.  History obtained from patient and past medical records. No interpretor was used.  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     Family History  Problem Relation Age of Onset   Hypertension Mother    Kidney disease Father    Diabetes Father    Breast cancer Paternal Aunt     Social History   Tobacco Use   Smoking status: Never Smoker   Smokeless tobacco: Never Used  Substance Use Topics   Alcohol use: No    Comment: 01-27-2017 per pt occa. Wine   Drug use: No    Comment: 01-27-2017 per pt no    Home Medications Prior to Admission medications   Medication Sig Start Date End Date Taking? Authorizing Provider  betamethasone dipropionate (DIPROLENE) 0.05 % cream Apply topically 2 (two) times daily. 06/30/19   Kerri Perches, MD  fluticasone (FLONASE) 50 MCG/ACT nasal spray Place 2 sprays into both nostrils daily. 07/11/16   Eustace Moore, MD  levocetirizine (XYZAL) 5 MG tablet Take 1 tablet (5 mg total) by mouth every evening. 12/06/19   Freddy Finner, NP  methocarbamol (ROBAXIN) 500 MG tablet Take 1 tablet (500 mg total) by mouth 2 (two) times daily. 04/07/20   ,  A, PA-C  naproxen (NAPROSYN) 500 MG tablet Take 1 tablet (500 mg total) by mouth 2 (two) times daily with a meal. 04/07/20   ,  A, PA-C  Norgestimate-Ethinyl Estradiol Triphasic 0.18/0.215/0.25 MG-25 MCG tab Take 1 tablet by mouth daily. 11/11/18   Kerri Perches, MD  omeprazole (PRILOSEC) 20 MG capsule Take 1 capsule (20 mg total) by mouth daily. 12/06/19   Freddy Finner, NP  phentermine (ADIPEX-P) 37.5 MG tablet Take 1 tablet (37.5 mg total) by mouth daily before breakfast. Take one half tablet by mouth every day 01/03/20   Kerri Perches, MD  venlafaxine XR (EFFEXOR-XR) 75 MG 24 hr capsule Take 1 capsule (75 mg total) by mouth daily with breakfast. Dose increase effective 03/24/2019 09/16/19   Kerri Perches, MD  Vitamin D, Ergocalciferol, (DRISDOL) 1.25 MG (50000 UT) CAPS capsule TAKE 1 CAPSULE BY MOUTH ONCE A WEEK AS DIRECTED 06/30/19    Kerri Perches, MD    Allergies    Shellfish allergy  Review of Systems   Review of Systems  Constitutional: Negative.   HENT: Negative.   Respiratory: Negative.   Cardiovascular: Negative.   Gastrointestinal: Positive for abdominal pain and nausea. Negative for abdominal distention, anal bleeding, blood in stool, constipation, diarrhea, rectal pain and vomiting.  Genitourinary: Positive for dysuria and flank pain. Negative for decreased urine volume, difficulty urinating, dyspareunia, enuresis, frequency, genital sores, hematuria, menstrual problem, pelvic pain, urgency, vaginal bleeding, vaginal discharge and vaginal pain.  Skin: Negative.   Neurological: Negative.   All other systems reviewed and are negative.  Physical Exam Updated Vital Signs BP 125/64 (BP Location: Right Arm)    Pulse (!) 48    Temp 98.3 F (36.8 C) (Oral)    Resp 14    Ht 5\' 8"  (1.727 m)    Wt 113.4 kg    SpO2 100%    BMI 38.01 kg/m   Physical Exam Vitals and nursing note reviewed.  Constitutional:      General: She is not in acute distress.    Appearance: She is well-developed. She is not ill-appearing, toxic-appearing or diaphoretic.  HENT:     Head: Normocephalic and atraumatic.     Mouth/Throat:     Mouth: Mucous membranes are moist.  Eyes:     Pupils: Pupils are equal, round, and reactive to light.  Cardiovascular:     Rate and Rhythm: Normal rate.  Pulmonary:     Effort: Pulmonary effort is normal. No respiratory distress.     Breath sounds: Normal breath sounds.  Abdominal:     General: Bowel sounds are normal. There is no distension.     Palpations: Abdomen is soft.     Tenderness: There is abdominal tenderness in the right lower quadrant, periumbilical area and suprapubic area. There is right CVA tenderness. There is no left CVA tenderness, guarding or rebound. Negative signs include Murphy's sign and McBurney's sign.     Hernia: No hernia is present.       Comments: Generalized  tenderness diffusely to right abdomen and right flank.  Negative Murphy sign, McBurney point.  No overlying skin changes.  Musculoskeletal:        General: Normal range of motion.     Cervical back: Normal range of motion.     Thoracic back: Normal.     Lumbar back: Normal.       Back:  Skin:    General: Skin is warm and dry.     Capillary Refill: Capillary refill takes less than 2 seconds.     Comments: Brisk capillary refill.  No edema, erythema or warmth.  No rashes or lesions.  Neurological:     General: No focal deficit present.     Mental Status: She is alert.    ED Results /  Procedures / Treatments   Labs (all labs ordered are listed, but only abnormal results are displayed) Labs Reviewed  COMPREHENSIVE METABOLIC PANEL - Abnormal; Notable for the following components:      Result Value   AST 14 (*)    All other components within normal limits  URINALYSIS, ROUTINE W REFLEX MICROSCOPIC - Abnormal; Notable for the following components:   Bacteria, UA RARE (*)    All other components within normal limits  URINE CULTURE  CBC  PREGNANCY, URINE    EKG None  Radiology CT Abdomen Pelvis W Contrast  Result Date: 04/07/2020 CLINICAL DATA:  Right-sided flank pain for approximately 1 week, worse today with burning with urination. EXAM: CT ABDOMEN AND PELVIS WITH CONTRAST TECHNIQUE: Multidetector CT imaging of the abdomen and pelvis was performed using the standard protocol following bolus administration of intravenous contrast. CONTRAST:  146mL OMNIPAQUE IOHEXOL 300 MG/ML  SOLN COMPARISON:  None. FINDINGS: Lower chest: Lung bases essentially clear.  Heart normal in size. Hepatobiliary: No focal liver abnormality is seen. No gallstones, gallbladder wall thickening, or biliary dilatation. Pancreas: Unremarkable. No pancreatic ductal dilatation or surrounding inflammatory changes. Spleen: Normal in size without focal abnormality. Adrenals/Urinary Tract: No adrenal masses. Kidneys normal  size, orientation and position with symmetric enhancement and excretion. No renal masses, stones or hydronephrosis. Normal ureters. Normal bladder. Stomach/Bowel: Previous gastric stapling procedure. No stomach wall thickening or adjacent inflammation. Small bowel and colon are normal in caliber. No wall thickening. No inflammation. Normal appendix visualized. Vascular/Lymphatic: No significant vascular findings are present. No enlarged abdominal or pelvic lymph nodes. Reproductive: Uterus and bilateral adnexa are unremarkable. Other: No abdominal wall hernia or abnormality. No abdominopelvic ascites. Musculoskeletal: No acute or significant osseous findings. IMPRESSION: 1. No acute findings. Changes from previous stomach surgery. No other abnormalities. No findings to account for the patient's right-sided symptoms. No renal or ureteral stones or obstructive uropathy. Electronically Signed   By: Lajean Manes M.D.   On: 04/07/2020 11:15    Procedures Procedures (including critical care time)  Medications Ordered in ED Medications  sodium chloride 0.9 % bolus 1,000 mL (0 mLs Intravenous Stopped 04/07/20 1206)  ondansetron (ZOFRAN) injection 4 mg (4 mg Intravenous Given 04/07/20 1007)  morphine 4 MG/ML injection 4 mg (4 mg Intravenous Given 04/07/20 1007)  iohexol (OMNIPAQUE) 300 MG/ML solution 100 mL (100 mLs Intravenous Contrast Given 04/07/20 1039)    ED Course  I have reviewed the triage vital signs and the nursing notes.  Pertinent labs & imaging results that were available during my care of the patient were reviewed by me and considered in my medical decision making (see chart for details).  65 old female peers otherwise well presents for evaluation of right abdominal and flank pain x2 weeks.  Symptoms intermittent in nature.  No associated worsening with food intake.  No overlying skin changes.  She is some generalized tenderness throughout her entire abdomen however worse to right mid abdomen and  right flank.  1 episode of dysuria earlier today however no hematuria.  Denies pelvic pain, vaginal discharge or concerns for STDs.  Pain reproducible to palpation however is negative Murphy sign.  Negative CVA tap bilaterally.  Pain also worse when she lays on this area.   Labs and imaging personally viewed interpreted from triage: CBC without leukocytosis Metabolic panel with electrolyte, renal abnormality Pregnancy test negative Urinalysis negative for infection will culture given 1 episode of dysuria. CT AP without evidence of infectious process.   Patient reassessed.  Significant improvement in pain and nausea.  Discussed lab and imaging findings.  Question MSK related given reproducible to palpation and worse with movement and laying it.  Will p.o. challenge.  Patient is nontoxic, nonseptic appearing, in no apparent distress.  Patient's pain and other symptoms adequately managed in emergency department.  Fluid bolus given.  Labs, imaging and vitals reviewed.  Patient does not meet the SIRS or Sepsis criteria.  On repeat exam patient does not have a surgical abdomin and there are no peritoneal signs.  No indication of appendicitis, bowel obstruction, bowel perforation, cholecystitis, diverticulitis, TOA, Torsion, PID or ectopic pregnancy.     Patient reassessed. Tolerating PO intake. Patient discharged home with symptomatic treatment and given strict instructions for follow-up with their primary care physician.  I have also discussed reasons to return immediately to the ER.  Patient expresses understanding and agrees with plan.  The patient has been appropriately medically screened and/or stabilized in the ED. I have low suspicion for any other emergent medical condition which would require further screening, evaluation or treatment in the ED or require inpatient management.  Patient is hemodynamically stable and in no acute distress.  Patient able to ambulate in department prior to ED.   Evaluation does not show acute pathology that would require ongoing or additional emergent interventions while in the emergency department or further inpatient treatment.  I have discussed the diagnosis with the patient and answered all questions.  Pain is been managed while in the emergency department and patient has no further complaints prior to discharge.  Patient is comfortable with plan discussed in room and is stable for discharge at this time.  I have discussed strict return precautions for returning to the emergency department.  Patient was encouraged to follow-up with PCP/specialist refer to at discharge.     MDM Rules/Calculators/A&P                       Final Clinical Impression(s) / ED Diagnoses Final diagnoses:  Abdominal pain, unspecified abdominal location  Right flank pain    Rx / DC Orders ED Discharge Orders         Ordered    naproxen (NAPROSYN) 500 MG tablet  2 times daily with meals     04/07/20 1244    methocarbamol (ROBAXIN) 500 MG tablet  2 times daily     04/07/20 1244           ,  A, PA-C 04/07/20 1300    [place], Adam, DO 04/07/20 1322

## 2020-04-07 NOTE — ED Notes (Signed)
Pt tolerating PO fluids

## 2020-04-07 NOTE — ED Notes (Signed)
Discharge paperwork reviewed with patient. Pt verbalized understanding, no further questions for staff at this time. IV removed. Pt ambulated with steady gait from ED.

## 2020-04-07 NOTE — ED Triage Notes (Signed)
Pt c/o right side flank pain for almost a week getting worse today with burning sensation on the urine.

## 2020-04-08 LAB — URINE CULTURE: Culture: <10000 — AB

## 2020-04-11 ENCOUNTER — Ambulatory Visit: Payer: 59 | Admitting: Family Medicine

## 2020-04-16 ENCOUNTER — Encounter: Payer: Self-pay | Admitting: Family Medicine

## 2020-04-16 ENCOUNTER — Other Ambulatory Visit (HOSPITAL_COMMUNITY)
Admission: RE | Admit: 2020-04-16 | Discharge: 2020-04-16 | Disposition: A | Discharge status: Home / Self Care | Payer: 59 | Source: Ambulatory Visit | Attending: Family Medicine | Admitting: Family Medicine

## 2020-04-16 ENCOUNTER — Other Ambulatory Visit: Payer: Self-pay

## 2020-04-16 ENCOUNTER — Ambulatory Visit: Payer: 59 | Admitting: Family Medicine

## 2020-04-16 VITALS — BP 127/71 | HR 70 | Temp 98.2°F | Resp 16 | Ht 68.0 in | Wt 239.7 lb

## 2020-04-16 DIAGNOSIS — M545 Low back pain, unspecified: Secondary | ICD-10-CM

## 2020-04-16 DIAGNOSIS — E66811 Obesity, class 1: Secondary | ICD-10-CM

## 2020-04-16 DIAGNOSIS — N760 Acute vaginitis: Secondary | ICD-10-CM | POA: Insufficient documentation

## 2020-04-16 DIAGNOSIS — K219 Gastro-esophageal reflux disease without esophagitis: Secondary | ICD-10-CM

## 2020-04-16 DIAGNOSIS — E669 Obesity, unspecified: Secondary | ICD-10-CM

## 2020-04-16 DIAGNOSIS — Z09 Encounter for follow-up examination after completed treatment for conditions other than malignant neoplasm: Secondary | ICD-10-CM

## 2020-04-16 MED ORDER — PHENTERMINE HCL 37.5 MG PO TABS
ORAL_TABLET | ORAL | 1 refills | Status: DC
Start: 2020-04-16 — End: 2020-08-14

## 2020-04-16 MED ORDER — PANTOPRAZOLE SODIUM 40 MG PO TBEC
40.0000 mg | DELAYED_RELEASE_TABLET | Freq: Every day | ORAL | 3 refills | Status: DC
Start: 2020-04-16 — End: 2022-01-28

## 2020-04-16 NOTE — Progress Notes (Signed)
   Stacey Palmer     MRN: [id number]      DOB: 09-24-1981   HPI Stacey Palmer is here for follow up of ED visit 9 days ago , dx of back pain, she has not filled robaxin or the NSAID prescribed. Lab data a reviewed and is within normal C/o abdominal and gas pains, reports poor food choice, increased sugar and snack with ongoing weight gain , wants referral back to bariatric Dr and to change habits to lose weight  ROS Denies recent fever or chills. Denies sinus pressure, nasal congestion, ear pain or sore throat. Denies chest congestion, productive cough or wheezing. Denies chest pains, palpitations and leg swelling Denies  nausea, vomiting,diarrhea or constipation.   Denies dysuria, frequency, hesitancy or incontinence.  Denies headaches, seizures, numbness, or tingling. Denies depression, anxiety or insomnia. Denies skin break down or rash.   PE  BP 127/71 (BP Location: Right Arm, Patient Position: Sitting, Cuff Size: Normal)   Pulse 70   Temp 98.2 F (36.8 C) (Oral)   Resp 16   Ht 5\' 8"  (1.727 m)   Wt 239 lb 11.2 oz (108.7 kg)   SpO2 97%   BMI 36.45 kg/m   Patient alert and oriented and in no cardiopulmonary distress.  HEENT: No facial asymmetry, EOMI,     Neck supple .  Chest: Clear to auscultation bilaterally.  CVS: S1, S2 no murmurs, no S3.Regular rate.  ABD: Soft mild epigastric tenderness, no guarding or rebound, no organomegaly or masses. Normal BS   Ext: No edema  MS: Adequate ROM spine, tender over left SI joint, shoulders, hips and knees.  Skin: Intact, no ulcerations or rash noted.  Psych: Good eye contact, normal affect. Memory intact not anxious or depressed appearing.  CNS: CN 2-12 intact, power,  normal throughout.no focal deficits noted.   Assessment & Plan  Vaginitis and vulvovaginitis 3 week h/o yellow d/c and RLQ pain, needs STD testing  Low back pain Was 7 this past weekend , now a 5, needs to take meds prescribed in eD and lose  weight  Gastroesophageal reflux disease Needs to resume daily med, experiencing central abdominal pain  Obesity (BMI 30.0-34.9)  Patient re-educated about  the importance of commitment to a  minimum of 150 minutes of exercise per week as able.  The importance of healthy food choices with portion control discussed, as well as eating regularly and within a 12 hour window most days. The need to choose "clean , green" food 50 to 75% of the time is discussed, as well as to make water the primary drink and set a goal of 64 ounces water daily.    Weight /BMI 04/16/2020 04/07/2020 01/03/2020  WEIGHT 239 lb 11.2 oz 250 lb 250 lb  HEIGHT 5\' 8"  5\' 8"  5\' 8"   BMI 36.45 kg/m2 38.01 kg/m2 38.01 kg/m2  Some encounter information is confidential and restricted. Go to Review Flowsheets activity to see all data.   Start half phenterminse and change habits, refer to bariatric surgery per pt request   Encounter for examination following treatment at hospital Patient in for follow up of recent ED visit Discharge summary, and laboratory and radiology data are reviewed, and any questions or concerns about recent hospitalization are discussed. Specific issues requiring follow up are specifically addressed.

## 2020-04-16 NOTE — Assessment & Plan Note (Addendum)
Needs to resume daily med, experiencing central abdominal pain

## 2020-04-16 NOTE — Assessment & Plan Note (Signed)
3 week h/o yellow d/c and RLQ pain, needs STD testing

## 2020-04-16 NOTE — Assessment & Plan Note (Signed)
  Patient re-educated about  the importance of commitment to a  minimum of 150 minutes of exercise per week as able.  The importance of healthy food choices with portion control discussed, as well as eating regularly and within a 12 hour window most days. The need to choose "clean , green" food 50 to 75% of the time is discussed, as well as to make water the primary drink and set a goal of 64 ounces water daily.    Weight /BMI 04/16/2020 04/07/2020 01/03/2020  WEIGHT 239 lb 11.2 oz 250 lb 250 lb  HEIGHT 5\' 8"  5\' 8"  5\' 8"   BMI 36.45 kg/m2 38.01 kg/m2 38.01 kg/m2  Some encounter information is confidential and restricted. Go to Review Flowsheets activity to see all data.   Start half phenterminse and change habits, refer to bariatric surgery per pt request

## 2020-04-16 NOTE — Patient Instructions (Addendum)
F/U with MD in 7 weeks call if you need me before  Please change food choice and commit to daily exercise for 30 minutes  Fill and take meds for back pain prescribed in ED  Start half phentermine daily   Start protonix daily for reflux  Specimen sent for testing for GC, chlamydia, BV, trichomonas , and yeast infection    Thanks for choosing [facility], we consider it a privelige to serve you.

## 2020-04-16 NOTE — Assessment & Plan Note (Signed)
Was 7 this past weekend , now a 5, needs to take meds prescribed in eD and lose weight

## 2020-04-16 NOTE — Assessment & Plan Note (Signed)
Patient in for follow up of recent ED visit Discharge summary, and laboratory and radiology data are reviewed, and any questions or concerns about recent hospitalization are discussed. Specific issues requiring follow up are specifically addressed.

## 2020-04-23 LAB — URINE CYTOLOGY ANCILLARY ONLY
Bacterial Vaginitis-Urine: NEGATIVE
Chlamydia: NEGATIVE
Comment: NEGATIVE
Comment: NEGATIVE
Comment: NORMAL
Neisseria Gonorrhea: NEGATIVE
Trichomonas: NEGATIVE

## 2020-05-09 ENCOUNTER — Ambulatory Visit: Payer: BC Managed Care – PPO | Admitting: Family Medicine

## 2020-05-15 ENCOUNTER — Encounter: Payer: 59 | Admitting: Adult Health

## 2020-05-24 ENCOUNTER — Ambulatory Visit (INDEPENDENT_AMBULATORY_CARE_PROVIDER_SITE_OTHER): Payer: 59 | Admitting: Family Medicine

## 2020-05-24 ENCOUNTER — Other Ambulatory Visit (HOSPITAL_COMMUNITY)
Admission: RE | Admit: 2020-05-24 | Discharge: 2020-05-24 | Disposition: A | Discharge status: Home / Self Care | Payer: 59 | Source: Ambulatory Visit | Attending: Family Medicine | Admitting: Family Medicine

## 2020-05-24 ENCOUNTER — Encounter: Payer: Self-pay | Admitting: Family Medicine

## 2020-05-24 ENCOUNTER — Other Ambulatory Visit: Payer: Self-pay

## 2020-05-24 VITALS — BP 116/79 | HR 68 | Resp 16 | Ht 68.0 in | Wt 242.0 lb

## 2020-05-24 DIAGNOSIS — K219 Gastro-esophageal reflux disease without esophagitis: Secondary | ICD-10-CM | POA: Diagnosis not present

## 2020-05-24 DIAGNOSIS — E669 Obesity, unspecified: Secondary | ICD-10-CM

## 2020-05-24 DIAGNOSIS — N760 Acute vaginitis: Secondary | ICD-10-CM

## 2020-05-24 MED ORDER — METRONIDAZOLE 0.75 % VA GEL: 1.0000 | Freq: Two times a day (BID) | VAGINAL | 0 refills | Status: DC

## 2020-05-24 NOTE — Assessment & Plan Note (Addendum)
Specimen sent treating presumptively for bV will follow up wet prep and culture

## 2020-05-24 NOTE — Patient Instructions (Signed)
Please change August 3 appointment to end of August for follow-up call if you need me sooner.  MetroGel is prescribed for use for your current symptoms and you will be contacted when the results of the tests come in.  You will receive 1200 and  1500-calorie diet sheets.   You really do need to cut out sugar and unhealthy snacks.  Healthy food is primarily nonprocessed food and these are d and vegetable.  For drinking the only thing to drink is water or unsweetened beverages.  It is important that you exercise regularly at least 30 minutes 5 times a week. If you develop chest pain, have severe difficulty breathing, or feel very tired, stop exercising immediately and seek medical attention  Think about what you will eat, plan ahead. Choose " clean, green, fresh or frozen" over canned, processed or packaged foods which are more sugary, salty and fatty. 70 to 75% of food eaten should be vegetables and fruit. Three meals at set times with snacks allowed between meals, but they must be fruit or vegetables. Aim to eat over a 12 hour period , example 7 am to 7 pm, and STOP after  your last meal of the day. Drink water,generally about 64 ounces per day, no other drink is as healthy. Fruit juice is best enjoyed in a healthy way, by EATING the fruit. Thanks for choosing [facility], we consider it a privelige to serve you.

## 2020-05-24 NOTE — Assessment & Plan Note (Signed)
  Patient re-educated about  the importance of commitment to a  minimum of 150 minutes of exercise per week as able.  The importance of healthy food choices with portion control discussed, as well as eating regularly and within a 12 hour window most days. The need to choose "clean , green" food 50 to 75% of the time is discussed, as well as to make water the primary drink and set a goal of 64 ounces water daily.    Weight /BMI 05/24/2020 04/16/2020 04/07/2020  WEIGHT 242 lb 239 lb 11.2 oz 250 lb  HEIGHT 5\' 8"  5\' 8"  5\' 8"   BMI 36.8 kg/m2 36.45 kg/m2 38.01 kg/m2  Some encounter information is confidential and restricted. Go to Review Flowsheets activity to see all data.

## 2020-05-26 ENCOUNTER — Encounter: Payer: Self-pay | Admitting: Family Medicine

## 2020-05-26 NOTE — Assessment & Plan Note (Signed)
Controlled, no change in medication  

## 2020-05-26 NOTE — Progress Notes (Signed)
Acute Office Visit  Subjective:    Patient ID: Stacey Palmer, female    DOB: 1981-01-10, 39 y.o.   MRN: [id number]  Chief Complaint  Patient presents with  . Vaginal Discharge    whitish discharge, wants to be checked for bv to make sure no infection. Has been going on for a few days    HPI Patient is in today for fishy white vaginal d/c for [past 3 days. Denies fever, chills or urinary symptoms  Past Medical History:  Diagnosis Date  . Allergy   . Anxiety   . 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 Dry eyes 12/26/2018  . Obesity   . Sleep apnea     Past Surgical History:  Procedure Laterality Date  . CESAREAN SECTION    . TUBAL LIGATION      Family History  Problem Relation Age of Onset  . Hypertension Mother   . Kidney disease Father   . Diabetes Father   . Breast cancer Paternal Aunt     Social History   Socioeconomic History  . Marital status: Single    Spouse name: Not on file  . Number of children: Not on file  . Years of education: Not on file  . Highest education level: Not on file  Occupational History  . Not on file  Tobacco Use  . Smoking status: Never Smoker  . Smokeless tobacco: Never Used  Vaping Use  . Vaping Use: Never used  Substance and Sexual Activity  . Alcohol use: No    Comment: 01-27-2017 per pt occa. Wine  . Drug use: No    Comment: 01-27-2017 per pt no  . Sexual activity: Not Currently    Birth control/protection: Surgical  Other Topics Concern  . Not on file  Social History Narrative  . Not on file   Social Determinants of Health   Financial Resource Strain:   . Difficulty of Paying Living Expenses:   Food Insecurity:   . Worried About Programme researcher, broadcasting/film/video in the Last Year:   . Barista in the Last Year:   Transportation Needs:   . Freight forwarder (Medical):   Marland Kitchen Lack of Transportation (Non-Medical):    Physical Activity:   . Days of Exercise per Week:   . Minutes of Exercise per Session:   Stress:   . Feeling of Stress :   Social Connections:   . Frequency of Communication with Friends and Family:   . Frequency of Social Gatherings with Friends and Family:   . Attends Religious Services:   . Active Member of Clubs or Organizations:   . Attends Banker Meetings:   Marland Kitchen Marital Status:   Intimate Partner Violence:   . Fear of Current or Ex-Partner:   . Emotionally Abused:   Marland Kitchen Physically Abused:   . Sexually Abused:     Outpatient Medications Prior to Visit  Medication Sig Dispense Refill  . betamethasone dipropionate (DIPROLENE) 0.05 % cream Apply topically 2 (two) times daily. 30 g 0  . fluticasone (FLONASE) 50 MCG/ACT nasal spray Place 2 sprays into both nostrils daily. 16 g 0  . levocetirizine (XYZAL) 5 MG tablet Take 1 tablet (5 mg total) by mouth every evening. 30 tablet 1  . Norgestimate-Ethinyl Estradiol Triphasic 0.18/0.215/0.25 MG-25 MCG tab Take 1 tablet by mouth daily. 3 Package 3  .  pantoprazole (PROTONIX) 40 MG tablet Take 1 tablet (40 mg total) by mouth daily. 30 tablet 3  . phentermine (ADIPEX-P) 37.5 MG tablet Take half tablet by mouth every morning with breakfast 15 tablet 1  . venlafaxine XR (EFFEXOR-XR) 75 MG 24 hr capsule Take 1 capsule (75 mg total) by mouth daily with breakfast. Dose increase effective 03/24/2019 30 capsule 3  . Vitamin D, Ergocalciferol, (DRISDOL) 1.25 MG (50000 UT) CAPS capsule TAKE 1 CAPSULE BY MOUTH ONCE A WEEK AS DIRECTED 4 capsule 4  . methocarbamol (ROBAXIN) 500 MG tablet Take 1 tablet (500 mg total) by mouth 2 (two) times daily. 20 tablet 0  . naproxen (NAPROSYN) 500 MG tablet Take 1 tablet (500 mg total) by mouth 2 (two) times daily with a meal. 30 tablet 0   No facility-administered medications prior to visit.    Allergies  Allergen Reactions  . Shellfish Allergy Hives    Reports only once (she did not have any episode  afterwards)    Review of Systems Denies recent fever or chills. Denies sinus pressure, nasal congestion, ear pain or sore throat. Denies chest congestion, productive cough or wheezing. Denies chest pains, palpitations and leg swelling Denies abdominal pain, nausea, vomiting,diarrhea or constipation.   Denies dysuria, frequency, hesitancy or incontinence. Denies joint pain, swelling and limitation in mobility. Denies headaches, seizures, numbness, or tingling. Denies uncontrolled  depression, anxiety or insomnia. Denies skin break down or rash.        Objective:    Physical Exam BP 116/79   Pulse 68   Resp 16   Ht 5\' 8"  (1.727 m)   Wt (!) 242 lb (109.8 kg)   SpO2 97%   BMI 36.80 kg/m  Chest: CTA bilaterally. CVS: S1 and S2, no S3 or n murmur Abd: soft, on tender, no enal angle or suprapubic tenderness Pelvic : deferred, self collected swab by pateint Ext : no edema BP 116/79   Pulse 68   Resp 16   Ht 5\' 8"  (1.727 m)   Wt (!) 242 lb (109.8 kg)   SpO2 97%   BMI 36.80 kg/m  Wt Readings from Last 3 Encounters:  05/24/20 (!) 242 lb (109.8 kg)  04/16/20 239 lb 11.2 oz (108.7 kg)  04/07/20 250 lb (113.4 kg)    Health Maintenance Due  Topic Date Due  . Hepatitis C Screening  Never done    There are no preventive care reminders to display for this patient.   Lab Results  Component Value Date   TSH 1.36 08/19/2018   Lab Results  Component Value Date   WBC 4.1 04/07/2020   HGB 13.3 04/07/2020   HCT 40.2 04/07/2020   MCV 95.5 04/07/2020   PLT 290 04/07/2020   Lab Results  Component Value Date   NA 138 04/07/2020   K 3.8 04/07/2020   CO2 24 04/07/2020   GLUCOSE 93 04/07/2020   BUN 10 04/07/2020   CREATININE 0.78 04/07/2020   BILITOT 0.8 04/07/2020   ALKPHOS 46 04/07/2020   AST 14 (L) 04/07/2020   ALT 11 04/07/2020   PROT 7.1 04/07/2020   ALBUMIN 3.5 04/07/2020   CALCIUM 9.1 04/07/2020   ANIONGAP 8 04/07/2020   Lab Results  Component Value Date    CHOL 180 08/19/2018   Lab Results  Component Value Date   HDL 83 08/19/2018   Lab Results  Component Value Date   LDLCALC 84 08/19/2018   Lab Results  Component Value Date   TRIG 53  08/19/2018   Lab Results  Component Value Date   CHOLHDL 2.2 08/19/2018   Lab Results  Component Value Date   HGBA1C 4.9 06/04/2017       Assessment & Plan:   Problem List Items Addressed This Visit      Genitourinary   Vulvovaginitis    Specimen sent treating presumptively for bV      Relevant Orders   Cervicovaginal ancillary only     Other   Obesity (BMI 30.0-34.9)     Patient re-educated about  the importance of commitment to a  minimum of 150 minutes of exercise per week as able.  The importance of healthy food choices with portion control discussed, as well as eating regularly and within a 12 hour window most days. The need to choose "clean , green" food 50 to 75% of the time is discussed, as well as to make water the primary drink and set a goal of 64 ounces water daily.    Weight /BMI 05/24/2020 04/16/2020 04/07/2020  WEIGHT 242 lb 239 lb 11.2 oz 250 lb  HEIGHT 5\' 8"  5\' 8"  5\' 8"   BMI 36.8 kg/m2 36.45 kg/m2 38.01 kg/m2  Some encounter information is confidential and restricted. Go to Review Flowsheets activity to see all data.              Meds ordered this encounter  Medications  . metroNIDAZOLE (METROGEL VAGINAL) 0.75 % vaginal gel    Sig: Place 1 Applicatorful vaginally 2 (two) times daily.    Dispense:  70 g    Refill:  0     , MD

## 2020-05-28 LAB — CERVICOVAGINAL ANCILLARY ONLY
Bacterial Vaginitis (gardnerella): NEGATIVE
Candida Glabrata: NEGATIVE
Candida Vaginitis: NEGATIVE
Chlamydia: NEGATIVE
Comment: NEGATIVE
Comment: NEGATIVE
Comment: NEGATIVE
Comment: NEGATIVE
Comment: NEGATIVE
Comment: NORMAL
Neisseria Gonorrhea: NEGATIVE
Trichomonas: NEGATIVE

## 2020-06-05 ENCOUNTER — Ambulatory Visit: Payer: 59 | Admitting: Family Medicine

## 2020-07-03 ENCOUNTER — Ambulatory Visit: Payer: 59 | Admitting: Family Medicine

## 2020-07-26 ENCOUNTER — Ambulatory Visit (INDEPENDENT_AMBULATORY_CARE_PROVIDER_SITE_OTHER): Payer: 59 | Admitting: Psychiatry

## 2020-07-26 ENCOUNTER — Other Ambulatory Visit: Payer: Self-pay

## 2020-07-26 ENCOUNTER — Encounter (HOSPITAL_COMMUNITY): Payer: Self-pay | Admitting: Psychiatry

## 2020-07-26 DIAGNOSIS — F411 Generalized anxiety disorder: Secondary | ICD-10-CM | POA: Diagnosis not present

## 2020-07-26 DIAGNOSIS — F324 Major depressive disorder, single episode, in partial remission: Secondary | ICD-10-CM
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
## 2020-07-27 ENCOUNTER — Other Ambulatory Visit: Payer: Self-pay

## 2020-07-27 ENCOUNTER — Ambulatory Visit (INDEPENDENT_AMBULATORY_CARE_PROVIDER_SITE_OTHER): Payer: 59

## 2020-07-27 DIAGNOSIS — Z23 Encounter for immunization: Secondary | ICD-10-CM

## 2020-08-08 ENCOUNTER — Emergency Department (HOSPITAL_COMMUNITY)
Admission: EM | Admit: 2020-08-08 | Discharge: 2020-08-08 | Disposition: A | Discharge status: Home / Self Care | Payer: 59 | Attending: Emergency Medicine | Admitting: Emergency Medicine

## 2020-08-08 ENCOUNTER — Encounter (HOSPITAL_COMMUNITY): Payer: Self-pay | Admitting: *Deleted

## 2020-08-08 ENCOUNTER — Other Ambulatory Visit: Payer: Self-pay

## 2020-08-08 DIAGNOSIS — M25511 Pain in right shoulder: Secondary | ICD-10-CM | POA: Diagnosis present

## 2020-08-08 DIAGNOSIS — T63441A Toxic effect of venom of bees, accidental (unintentional), initial encounter: Secondary | ICD-10-CM | POA: Diagnosis not present

## 2020-08-08 DIAGNOSIS — T63461A Toxic effect of venom of wasps, accidental (unintentional), initial encounter: Secondary | ICD-10-CM

## 2020-08-08 NOTE — ED Triage Notes (Signed)
Pt c/o yellow jacket sting to right upper back this morning. Pt c/o a lot of pain around the site. No difficulty breathing, facial/throat swelling.

## 2020-08-08 NOTE — ED Provider Notes (Signed)
[facility] EMERGENCY DEPARTMENT Provider Note  CSN: [id number] Arrival date & time: 08/08/20 0741    History Chief Complaint  Patient presents with  . Insect Bite    yellow jacket    HPI  Stacey Palmer is a 39 y.o. female reports she was on her way to take her son to school when they were both stung by an insect she believes was a yellow jacket She was stung on the R scapular area Complaining of moderate to severe aching pain. No throat closing, tongue swelling, SOB or lightheadedness.    Past Medical History:  Diagnosis Date  . Allergy   . Anxiety   . 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 Dry eyes 12/26/2018  . Obesity   . Sleep apnea     Past Surgical History:  Procedure Laterality Date  . BARIATRIC SURGERY  2018  . CESAREAN SECTION    . TUBAL LIGATION      Family History  Problem Relation Age of Onset  . Hypertension Mother   . Kidney disease Father   . Diabetes Father   . Breast cancer Paternal Aunt     Social History   Tobacco Use  . Smoking status: Never Smoker  . Smokeless tobacco: Never Used  Vaping Use  . Vaping Use: Never used  Substance Use Topics  . Alcohol use: No  . Drug use: No     Home Medications Prior to Admission medications   Medication Sig Start Date End Date Taking? Authorizing Provider  betamethasone dipropionate (DIPROLENE) 0.05 % cream Apply topically 2 (two) times daily. Patient not taking: Reported on 07/26/2020 06/30/19   Kerri Perches, MD  fluticasone [facility]) 50 MCG/ACT nasal spray Place 2 sprays into both nostrils daily. 07/11/16   Eustace Moore, MD  levocetirizine (XYZAL) 5 MG tablet Take 1 tablet (5 mg total) by mouth every evening. 12/06/19   Freddy Finner, NP  metroNIDAZOLE (METROGEL VAGINAL) 0.75 % vaginal gel Place 1 Applicatorful vaginally 2 (two) times daily. Patient not taking: Reported on 07/26/2020  05/24/20   Kerri Perches, MD  Norgestimate-Ethinyl Estradiol Triphasic 0.18/0.215/0.25 MG-25 MCG tab Take 1 tablet by mouth daily. Patient not taking: Reported on 07/26/2020 11/11/18   Kerri Perches, MD  pantoprazole (PROTONIX) 40 MG tablet Take 1 tablet (40 mg total) by mouth daily. Patient not taking: Reported on 07/26/2020 04/16/20   Kerri Perches, MD  phentermine (ADIPEX-P) 37.5 MG tablet Take half tablet by mouth every morning with breakfast Patient not taking: Reported on 07/26/2020 04/16/20   Kerri Perches, MD  venlafaxine XR (EFFEXOR-XR) 75 MG 24 hr capsule Take 1 capsule (75 mg total) by mouth daily with breakfast. Dose increase effective 03/24/2019 09/16/19   Kerri Perches, MD  Vitamin D, Ergocalciferol, (DRISDOL) 1.25 MG (50000 UT) CAPS capsule TAKE 1 CAPSULE BY MOUTH ONCE A WEEK AS DIRECTED 06/30/19   Kerri Perches, MD     Allergies    Shellfish allergy   Review of Systems   Review of Systems A comprehensive review of systems was completed and negative except as noted in HPI.    Physical Exam BP 129/74 (BP Location: Left Arm)   Pulse 64   Temp 98.2 F (36.8 C) (Oral)   Resp 18   Ht 5\' 6"  (1.676 m)   Wt 86.2 kg   LMP  08/02/2020   SpO2 100%   BMI 30.67 kg/m   Physical Exam Vitals and nursing note reviewed.  Constitutional:      Appearance: Normal appearance.  HENT:     Head: Normocephalic and atraumatic.     Nose: Nose normal.     Mouth/Throat:     Mouth: Mucous membranes are moist.  Eyes:     Extraocular Movements: Extraocular movements intact.     Conjunctiva/sclera: Conjunctivae normal.  Cardiovascular:     Rate and Rhythm: Normal rate.  Pulmonary:     Effort: Pulmonary effort is normal.     Breath sounds: Normal breath sounds. No stridor.  Abdominal:     General: Abdomen is flat.     Palpations: Abdomen is soft.     Tenderness: There is no abdominal tenderness.  Musculoskeletal:        General: No swelling. Normal range  of motion.     Cervical back: Neck supple.  Skin:    General: Skin is warm and dry.     Comments: Small area of induration/erythema over the R scapula, no signs of systemic reaction.   Neurological:     General: No focal deficit present.     Mental Status: She is alert.  Psychiatric:        Mood and Affect: Mood normal.      ED Results / Procedures / Treatments   Labs (all labs ordered are listed, but only abnormal results are displayed) Labs Reviewed - No data to display  EKG None  Radiology No results found.  Procedures Procedures  Medications Ordered in the ED Medications - No data to display   MDM Rules/Calculators/A&P MDM Patient with insect sting, no signs of significant systemic reaction. Recommend OTC hydrocortisone cream, benadryl, Motrin, APAP and ice pack for symptom relief. She is also requesting a work note for today.  ED Course  I have reviewed the triage vital signs and the nursing notes.  Pertinent labs & imaging results that were available during my care of the patient were reviewed by me and considered in my medical decision making (see chart for details).     Final Clinical Impression(s) / ED Diagnoses Final diagnoses:  Yellow jacket sting, accidental or unintentional, initial encounter    Rx / DC Orders ED Discharge Orders    None       Pollyann Savoy, MD 08/08/20 [phone]

## 2020-08-09 ENCOUNTER — Other Ambulatory Visit: Payer: Self-pay | Admitting: Family Medicine

## 2020-08-13 ENCOUNTER — Ambulatory Visit (INDEPENDENT_AMBULATORY_CARE_PROVIDER_SITE_OTHER): Payer: 59 | Admitting: Psychiatry

## 2020-08-13 ENCOUNTER — Other Ambulatory Visit: Payer: Self-pay

## 2020-08-13 DIAGNOSIS — F411 Generalized anxiety disorder: Secondary | ICD-10-CM
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
## 2020-08-27 ENCOUNTER — Other Ambulatory Visit: Payer: Self-pay

## 2020-08-27 ENCOUNTER — Telehealth (HOSPITAL_COMMUNITY): Payer: Self-pay | Admitting: Psychiatry

## 2020-08-27 ENCOUNTER — Ambulatory Visit (HOSPITAL_COMMUNITY): Payer: 59 | Admitting: Psychiatry

## 2020-08-27 NOTE — Telephone Encounter (Signed)
Therapist contacted patient for scheduled appointment.  However, patient has schedule conflict due to her job.  Therapist and patient agreed to cancel today's appointment.

## 2020-08-29 ENCOUNTER — Encounter: Payer: 59 | Admitting: Family Medicine

## 2020-09-10 ENCOUNTER — Other Ambulatory Visit: Payer: Self-pay

## 2020-09-10 ENCOUNTER — Ambulatory Visit (INDEPENDENT_AMBULATORY_CARE_PROVIDER_SITE_OTHER): Payer: 59 | Admitting: Psychiatry

## 2020-09-10 DIAGNOSIS — F411 Generalized anxiety disorder: Secondary | ICD-10-CM | POA: Diagnosis not present

## 2020-09-10 DIAGNOSIS — F324 Major depressive disorder, single episode, in partial remission: Secondary | ICD-10-CM
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
## 2020-09-13 ENCOUNTER — Other Ambulatory Visit: Payer: Self-pay

## 2020-09-13 ENCOUNTER — Encounter: Payer: Self-pay | Admitting: Family Medicine

## 2020-09-13 ENCOUNTER — Ambulatory Visit (INDEPENDENT_AMBULATORY_CARE_PROVIDER_SITE_OTHER): Payer: 59 | Admitting: Family Medicine

## 2020-09-13 VITALS — BP 122/77 | HR 81 | Resp 16 | Ht 68.0 in | Wt 240.0 lb

## 2020-09-13 DIAGNOSIS — Z0000 Encounter for general adult medical examination without abnormal findings: Secondary | ICD-10-CM

## 2020-09-13 DIAGNOSIS — M25551 Pain in right hip: Secondary | ICD-10-CM | POA: Diagnosis not present

## 2020-09-13 DIAGNOSIS — N9089 Other specified noninflammatory disorders of vulva and perineum: Secondary | ICD-10-CM | POA: Diagnosis not present

## 2020-09-13 DIAGNOSIS — R0683 Snoring: Secondary | ICD-10-CM

## 2020-09-13 DIAGNOSIS — E669 Obesity, unspecified: Secondary | ICD-10-CM

## 2020-09-13 MED ORDER — PREDNISONE 5 MG (21) PO TBPK
5.0000 mg | ORAL_TABLET | ORAL | 0 refills | Status: DC
Start: 2020-09-13 — End: 2020-10-01

## 2020-09-13 MED ORDER — PHENTERMINE HCL 37.5 MG PO TABS
ORAL_TABLET | ORAL | 1 refills | Status: DC
Start: 2020-09-13 — End: 2021-01-31

## 2020-09-13 NOTE — Patient Instructions (Addendum)
F/U in office with mD in 4 months, re evaluate weight, call if you need me sooner  Weight .loss goal of 10 pounds, you are doing well  Prednisone prescribed short term for right hip pain, and take tylenol 500 mg one twice daily for 1 week for pain, call early December if still having pain  You are referred to [facility] re left labial skin tag and to Pulmonary in [place] re sleep apnea evaluation  Please take venlafaxine daily as prescribed  It is important that you exercise regularly at least 30 minutes 5 times a week. If you develop chest pain, have severe difficulty breathing, or feel very tired, stop exercising immediately and seek medical attention

## 2020-09-16 ENCOUNTER — Encounter: Payer: Self-pay | Admitting: Family Medicine

## 2020-09-16 DIAGNOSIS — M25551 Pain in right hip: Secondary | ICD-10-CM | POA: Insufficient documentation

## 2020-09-16 DIAGNOSIS — R0683 Snoring: Secondary | ICD-10-CM | POA: Insufficient documentation

## 2020-09-16 NOTE — Progress Notes (Signed)
    Stacey Palmer     MRN: [id number]      DOB: 05/17/1981  HPI: Patient is in for annual physical exam. C/o irritating left labial skin tag which she wants removed. C/o obesity, working on this F/u right flank pain from recent ED visit, still present, is requesting abdominal scan, however, has a normal abdominal and pelvic scan in the past 6 months H/o excessive snoring, and fatigue , needs sleep evaluation PE: BP 122/77   Pulse 81   Resp 16   Ht 5\' 8"  (1.727 m)   Wt 240 lb 0.6 oz (108.9 kg)   SpO2 100%   BMI 36.50 kg/m   Pleasant  female, alert and oriented x 3, in no cardio-pulmonary distress. Afebrile. HEENT No facial trauma or asymetry. Sinuses non tender.  Extra occullar muscles intact.. External ears normal, . Neck: supple, no adenopathy,JVD or thyromegaly.No bruits.  Chest: Clear to ascultation bilaterally.No crackles or wheezes. Non tender to palpation  Breast: Not examined, asymptomatic and normal mammogram in 03/2020  Cardiovascular system; Heart sounds normal,  S1 and  S2 ,no S3.  No murmur, or thrill. Apical beat not displaced Peripheral pulses normal.  Abdomen: Soft, non tender, no organomegaly or masses. No bruits. Bowel sounds normal. No guarding, tenderness or rebound.   GU: External genitalia skin tag on left labia  Musculoskeletal exam: Full ROM of spine, , shoulders and knees.Reduced in right hip No deformity ,swelling or crepitus noted. No muscle wasting or atrophy.   Neurologic: Cranial nerves 2 to 12 intact. Power, tone ,sensation and reflexes normal throughout. No disturbance in gait. No tremor.  Skin: Intact, no ulceration, erythema , scaling or rash noted. Pigmentation normal throughout  Psych; Normal mood and affect. Judgement and concentration normal   Assessment & Plan:   Hip pain, right Prednisone dose pack prescribed  Annual physical exam Annual exam as documented. Counseling done  re healthy lifestyle involving  commitment to 150 minutes exercise per week, heart healthy diet, and attaining healthy weight.The importance of adequate sleep also discussed. Regular seat belt use and home safety, is also discussed. Changes in health habits are decided on by the patient with goals and time frames  set for achieving them. Immunization and cancer screening needs are specifically addressed at this visit.   Labial skin tag Left labial skin tag , irritating pt, refer gyne  Snoring Excessive snoring and fatigue, refer for sleep study  Obesity (BMI 30.0-34.9)  Patient re-educated about  the importance of commitment to a  minimum of 150 minutes of exercise per week as able.  The importance of healthy food choices with portion control discussed, as well as eating regularly and within a 12 hour window most days. The need to choose "clean , green" food 50 to 75% of the time is discussed, as well as to make water the primary drink and set a goal of 64 ounces water daily.    Weight /BMI 09/13/2020 08/08/2020 05/24/2020  WEIGHT 240 lb 0.6 oz 190 lb 242 lb  HEIGHT 5\' 8"  5\' 6"  5\' 8"   BMI 36.5 kg/m2 30.67 kg/m2 36.8 kg/m2  Some encounter information is confidential and restricted. Go to Review Flowsheets activity to see all data.

## 2020-09-16 NOTE — Assessment & Plan Note (Signed)

## 2020-09-16 NOTE — Assessment & Plan Note (Signed)
  Patient re-educated about  the importance of commitment to a  minimum of 150 minutes of exercise per week as able.  The importance of healthy food choices with portion control discussed, as well as eating regularly and within a 12 hour window most days. The need to choose "clean , green" food 50 to 75% of the time is discussed, as well as to make water the primary drink and set a goal of 64 ounces water daily.    Weight /BMI 09/13/2020 08/08/2020 05/24/2020  WEIGHT 240 lb 0.6 oz 190 lb 242 lb  HEIGHT 5\' 8"  5\' 6"  5\' 8"   BMI 36.5 kg/m2 30.67 kg/m2 36.8 kg/m2  Some encounter information is confidential and restricted. Go to Review Flowsheets activity to see all data.

## 2020-09-16 NOTE — Assessment & Plan Note (Signed)
Prednisone dose pack prescribed 

## 2020-09-16 NOTE — Assessment & Plan Note (Signed)
Excessive snoring and fatigue, refer for sleep study

## 2020-09-16 NOTE — Assessment & Plan Note (Signed)
Left labial skin tag , irritating pt, refer gyne

## 2020-09-18 ENCOUNTER — Telehealth: Payer: Self-pay

## 2020-09-18 ENCOUNTER — Ambulatory Visit (INDEPENDENT_AMBULATORY_CARE_PROVIDER_SITE_OTHER): Payer: 59 | Admitting: Internal Medicine

## 2020-09-18 ENCOUNTER — Other Ambulatory Visit: Payer: Self-pay

## 2020-09-18 ENCOUNTER — Encounter: Payer: Self-pay | Admitting: Internal Medicine

## 2020-09-18 VITALS — BP 135/78 | HR 61 | Temp 98.4°F | Resp 18 | Ht 68.0 in | Wt 234.9 lb

## 2020-09-18 DIAGNOSIS — A6000 Herpesviral infection of urogenital system, unspecified: Secondary | ICD-10-CM | POA: Insufficient documentation

## 2020-09-18 DIAGNOSIS — N3000 Acute cystitis without hematuria: Secondary | ICD-10-CM | POA: Diagnosis not present

## 2020-09-18 DIAGNOSIS — N898 Other specified noninflammatory disorders of vagina: Secondary | ICD-10-CM | POA: Insufficient documentation

## 2020-09-18 DIAGNOSIS — N3090 Cystitis, unspecified without hematuria: Secondary | ICD-10-CM | POA: Insufficient documentation

## 2020-09-18 HISTORY — DX: Other specified noninflammatory disorders of vagina: N89.8

## 2020-09-18 LAB — POCT URINALYSIS DIP (CLINITEK)
Bilirubin, UA: NEGATIVE
Blood, UA: NEGATIVE
Glucose, UA: NEGATIVE mg/dL
Ketones, POC UA: NEGATIVE mg/dL
Leukocytes, UA: NEGATIVE
Nitrite, UA: NEGATIVE
POC PROTEIN,UA: NEGATIVE
Spec Grav, UA: >=1.03 — AB (ref 1.010–1.025)
Urobilinogen, UA: 0.2 E.U./dL
pH, UA: 6 (ref 5.0–8.0)

## 2020-09-18 MED ORDER — PHENAZOPYRIDINE HCL 100 MG PO TABS: 100.0000 mg | ORAL_TABLET | Freq: Three times a day (TID) | ORAL | 0 refills | Status: DC | PRN

## 2020-09-18 MED ORDER — NITROFURANTOIN MONOHYD MACRO 100 MG PO CAPS: 100.0000 mg | ORAL_CAPSULE | Freq: Two times a day (BID) | ORAL | 0 refills | Status: DC

## 2020-09-18 NOTE — Telephone Encounter (Signed)
Patient lvm stating a uti was discussed at recent visit and an antibiotic was supposed to be sent in. She has not gotten anything. Please advise

## 2020-09-18 NOTE — Telephone Encounter (Signed)
Urine tested in the office showed NO infection, please let her know, NO antibiotic needed, drink sufficient water and empty regularly

## 2020-09-18 NOTE — Assessment & Plan Note (Signed)
UA reviewed But c/o dysuria, lower abdominal and right flank pain with right CVA tenderness F/u urine culture Symptoms typical of UTI - will start Macrobid Pyridium PRN

## 2020-09-18 NOTE — Progress Notes (Signed)
Established Patient Office Visit  Subjective:  Patient ID: Stacey Palmer, female    DOB: 12/17/1980  Age: 39 y.o. MRN: [id number]  CC:  Chief Complaint  Patient presents with   Urinary Tract Infection    Pt feels like she has a UTI has burning frequent urination and odor for about two days    HPI [name] presents for evaluation of dysuria.  Urinary Tract Infection: Patient complains of dysuria and foul smelling urine She has had symptoms for 2 days. Patient also complains of urinary frequency and lower abdominal and right sided flank pain. Patient denies fever and vaginal discharge. Patient does not have a history of recurrent UTI.  Patient does not have a history of pyelonephritis. No h/o nephrolithiasis.   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/26/2018   Obesity    Sleep apnea     Past Surgical History:  Procedure Laterality Date   BARIATRIC SURGERY  2018   CESAREAN SECTION     TUBAL LIGATION      Family History  Problem Relation Age of Onset   Hypertension Mother    Kidney disease Father    Diabetes Father    Breast cancer Paternal Aunt     Social History   Socioeconomic History   Marital status: Single    Spouse name: Not on file   Number of children: Not on file   Years of education: Not on file   Highest education level: Not on file  Occupational History   Not on file  Tobacco Use   Smoking status: Never Smoker   Smokeless tobacco: Never Used  Vaping Use   Vaping Use: Never used  Substance and Sexual Activity   Alcohol use: No   Drug use: No   Sexual activity: Not Currently    Birth control/protection: Surgical  Other Topics Concern   Not on file  Social History Narrative   Not on file   Social Determinants of Health   Financial  Resource Strain:    Difficulty of Paying Living Expenses: Not on file  Food Insecurity:    Worried About Running Out of Food in the Last Year: Not on file   [employer] of Food in the Last Year: Not on file  Transportation Needs:    Lack of Transportation (Medical): Not on file   Lack of Transportation (Non-Medical): Not on file  Physical Activity:    Days of Exercise per Week: Not on file   Minutes of Exercise per Session: Not on file  Stress:    Feeling of Stress : Not on file  Social Connections:    Frequency of Communication with Friends and Family: Not on file   Frequency of Social Gatherings with Friends and Family: Not on file   Attends Religious Services: Not on file   Active Member of Clubs or Organizations: Not on file   Attends Banker Meetings: Not on file   Marital Status: Not on file  Intimate Partner Violence:    Fear of Current or Ex-Partner: Not on file   Emotionally Abused: Not on file   Physically Abused: Not on file   Sexually Abused: Not on file    Outpatient Medications Prior to Visit  Medication Sig Dispense Refill  betamethasone dipropionate (DIPROLENE) 0.05 % cream Apply topically 2 (two) times daily. 30 g 0   fluticasone (FLONASE) 50 MCG/ACT nasal spray Place 2 sprays into both nostrils daily. 16 g 0   levocetirizine (XYZAL) 5 MG tablet Take 1 tablet (5 mg total) by mouth every evening. 30 tablet 1   Norgestimate-Ethinyl Estradiol Triphasic 0.18/0.215/0.25 MG-25 MCG tab Take 1 tablet by mouth daily. 3 Package 3   pantoprazole (PROTONIX) 40 MG tablet Take 1 tablet (40 mg total) by mouth daily. 30 tablet 3   phentermine (ADIPEX-P) 37.5 MG tablet Take one half tablet by mouth every morning with breakfast 30 tablet 1   predniSONE (STERAPRED UNI-PAK 21 TAB) 5 MG (21) TBPK tablet Take 1 tablet (5 mg total) by mouth as directed. Use as directed 21 tablet 0   venlafaxine XR (EFFEXOR-XR) 75 MG 24 hr capsule Take 1 capsule (75 mg  total) by mouth daily with breakfast. Dose increase effective 03/24/2019 30 capsule 3   No facility-administered medications prior to visit.    Allergies  Allergen Reactions   Shellfish Allergy Hives    Reports only once (she did not have any episode afterwards) Reports only once (she did not have any episode afterwards)    ROS Review of Systems  Constitutional: Positive for chills. Negative for fever.  HENT: Negative for congestion, sinus pressure, sinus pain and sore throat.   Eyes: Negative for pain and discharge.  Respiratory: Negative for cough and shortness of breath.   Cardiovascular: Negative for chest pain and palpitations.  Gastrointestinal: Negative for abdominal pain, constipation, diarrhea, nausea and vomiting.  Endocrine: Negative for polydipsia and polyuria.  Genitourinary: Positive for dysuria, flank pain and frequency. Negative for hematuria.  Musculoskeletal: Negative for neck pain and neck stiffness.  Skin: Negative for rash.  Neurological: Negative for dizziness and weakness.  Psychiatric/Behavioral: Negative for agitation and behavioral problems.      Objective:    Physical Exam Vitals reviewed.  Constitutional:      General: She is not in acute distress.    Appearance: She is obese. She is not diaphoretic.  HENT:     Head: Normocephalic and atraumatic.     Nose: Nose normal. No congestion.     Mouth/Throat:     Mouth: Mucous membranes are moist.     Pharynx: No posterior oropharyngeal erythema.  Eyes:     General: No scleral icterus.    Extraocular Movements: Extraocular movements intact.     Pupils: Pupils are equal, round, and reactive to light.  Cardiovascular:     Rate and Rhythm: Normal rate and regular rhythm.     Heart sounds: No murmur heard.   Pulmonary:     Breath sounds: Normal breath sounds. No wheezing or rales.  Abdominal:     Palpations: Abdomen is soft.     Tenderness: There is abdominal tenderness (Lower abdominal). There  is right CVA tenderness. There is no left CVA tenderness, guarding or rebound.  Musculoskeletal:     Cervical back: Neck supple. No tenderness.     Right lower leg: No edema.     Left lower leg: No edema.  Skin:    General: Skin is warm.     Findings: No rash.  Neurological:     General: No focal deficit present.     Mental Status: She is alert and oriented to person, place, and time.     Sensory: No sensory deficit.     Motor: No weakness.  Psychiatric:  Mood and Affect: Mood normal.        Behavior: Behavior normal.     BP 135/78 (BP Location: Right Arm, Patient Position: Sitting, Cuff Size: Normal)    Pulse 61    Temp 98.4 F (36.9 C) (Oral)    Resp 18    Ht 5\' 8"  (1.727 m)    Wt 234 lb 14.4 oz (106.5 kg)    SpO2 100%    BMI 35.72 kg/m  Wt Readings from Last 3 Encounters:  09/18/20 234 lb 14.4 oz (106.5 kg)  09/13/20 240 lb 0.6 oz (108.9 kg)  08/08/20 190 lb (86.2 kg)     Health Maintenance Due  Topic Date Due   Hepatitis C Screening  Never done    There are no preventive care reminders to display for this patient.  Lab Results  Component Value Date   TSH 1.36 08/19/2018   Lab Results  Component Value Date   WBC 4.1 04/07/2020   HGB 13.3 04/07/2020   HCT 40.2 04/07/2020   MCV 95.5 04/07/2020   PLT 290 04/07/2020   Lab Results  Component Value Date   NA 138 04/07/2020   K 3.8 04/07/2020   CO2 24 04/07/2020   GLUCOSE 93 04/07/2020   BUN 10 04/07/2020   CREATININE 0.78 04/07/2020   BILITOT 0.8 04/07/2020   ALKPHOS 46 04/07/2020   AST 14 (L) 04/07/2020   ALT 11 04/07/2020   PROT 7.1 04/07/2020   ALBUMIN 3.5 04/07/2020   CALCIUM 9.1 04/07/2020   ANIONGAP 8 04/07/2020   Lab Results  Component Value Date   CHOL 180 08/19/2018   Lab Results  Component Value Date   HDL 83 08/19/2018   Lab Results  Component Value Date   LDLCALC 84 08/19/2018   Lab Results  Component Value Date   TRIG 53 08/19/2018   Lab Results  Component Value Date    CHOLHDL 2.2 08/19/2018   Lab Results  Component Value Date   HGBA1C 4.9 06/04/2017      Assessment & Plan:   Problem List Items Addressed This Visit      Genitourinary   Acute cystitis without hematuria - Primary    UA reviewed But c/o dysuria, lower abdominal and right flank pain with right CVA tenderness F/u urine culture Symptoms typical of UTI - will start Macrobid Pyridium PRN      Relevant Medications   nitrofurantoin, macrocrystal-monohydrate, (MACROBID) 100 MG capsule   phenazopyridine (PYRIDIUM) 100 MG tablet   Other Relevant Orders   POCT URINALYSIS DIP (CLINITEK) (Completed)   Urine Culture      Meds ordered this encounter  Medications   nitrofurantoin, macrocrystal-monohydrate, (MACROBID) 100 MG capsule    Sig: Take 1 capsule (100 mg total) by mouth 2 (two) times daily.    Dispense:  14 capsule    Refill:  0   phenazopyridine (PYRIDIUM) 100 MG tablet    Sig: Take 1 tablet (100 mg total) by mouth 3 (three) times daily as needed for pain.    Dispense:  20 tablet    Refill:  0    Follow-up: Return if symptoms worsen or fail to improve.    08/04/2017, MD

## 2020-09-18 NOTE — Telephone Encounter (Signed)
Returned patients call. No answer, lvm asking for a return call

## 2020-09-18 NOTE — Patient Instructions (Signed)
Urinary Tract Infection, Adult A urinary tract infection (UTI) is an infection of any part of the urinary tract. The urinary tract includes:  The kidneys.  The ureters.  The bladder.  The urethra. These organs make, store, and get rid of pee (urine) in the body. What are the causes? This is caused by germs (bacteria) in your genital area. These germs grow and cause swelling (inflammation) of your urinary tract. What increases the risk? You are more likely to develop this condition if:  You have a small, thin tube (catheter) to drain pee.  You cannot control when you pee or poop (incontinence).  You are female, and: ? You use these methods to prevent pregnancy:  A medicine that kills sperm (spermicide).  A device that blocks sperm (diaphragm). ? You have low levels of a female hormone (estrogen). ? You are pregnant.  You have genes that add to your risk.  You are sexually active.  You take antibiotic medicines.  You have trouble peeing because of: ? A prostate that is bigger than normal, if you are female. ? A blockage in the part of your body that drains pee from the bladder (urethra). ? A kidney stone. ? A nerve condition that affects your bladder (neurogenic bladder). ? Not getting enough to drink. ? Not peeing often enough.  You have other conditions, such as: ? Diabetes. ? A weak disease-fighting system (immune system). ? Sickle cell disease. ? Gout. ? Injury of the spine. What are the signs or symptoms? Symptoms of this condition include:  Needing to pee right away (urgently).  Peeing often.  Peeing small amounts often.  Pain or burning when peeing.  Blood in the pee.  Pee that smells bad or not like normal.  Trouble peeing.  Pee that is cloudy.  Fluid coming from the vagina, if you are female.  Pain in the belly or lower back. Other symptoms include:  Throwing up (vomiting).  No urge to eat.  Feeling mixed up (confused).  Being tired  and grouchy (irritable).  A fever.  Watery poop (diarrhea). How is this treated? This condition may be treated with:  Antibiotic medicine.  Other medicines.  Drinking enough water. Follow these instructions at home:  Medicines  Take over-the-counter and prescription medicines only as told by your doctor.  If you were prescribed an antibiotic medicine, take it as told by your doctor. Do not stop taking it even if you start to feel better. General instructions  Make sure you: ? Pee until your bladder is empty. ? Do not hold pee for a long time. ? Empty your bladder after sex. ? Wipe from front to back after pooping if you are a female. Use each tissue one time when you wipe.  Drink enough fluid to keep your pee pale yellow.  Keep all follow-up visits as told by your doctor. This is important. Contact a doctor if:  You do not get better after 1-2 days.  Your symptoms go away and then come back. Get help right away if:  You have very bad back pain.  You have very bad pain in your lower belly.  You have a fever.  You are sick to your stomach (nauseous).  You are throwing up. Summary  A urinary tract infection (UTI) is an infection of any part of the urinary tract.  This condition is caused by germs in your genital area.  There are many risk factors for a UTI. These include having a small, thin   tube to drain pee and not being able to control when you pee or poop.  Treatment includes antibiotic medicines for germs.  Drink enough fluid to keep your pee pale yellow. This information is not intended to replace advice given to you by your health care provider. Make sure you discuss any questions you have with your health care provider. Document Revised: 10/07/2018 Document Reviewed: 04/29/2018 Elsevier Patient Education  2020 Elsevier Inc.  

## 2020-09-27 LAB — URINE CULTURE

## 2020-10-01 ENCOUNTER — Ambulatory Visit (INDEPENDENT_AMBULATORY_CARE_PROVIDER_SITE_OTHER): Payer: 59 | Admitting: Adult Health

## 2020-10-01 ENCOUNTER — Other Ambulatory Visit: Payer: Self-pay

## 2020-10-01 ENCOUNTER — Encounter: Payer: Self-pay | Admitting: Adult Health

## 2020-10-01 VITALS — BP 121/81 | HR 85 | Ht 67.0 in | Wt 240.0 lb

## 2020-10-01 DIAGNOSIS — L918 Other hypertrophic disorders of the skin: Secondary | ICD-10-CM | POA: Diagnosis not present

## 2020-10-01 NOTE — Addendum Note (Signed)
Addended by: Colen Darling on: 10/01/2020 04:25 PM   Modules accepted: Orders

## 2020-10-01 NOTE — Progress Notes (Signed)
  Subjective:     Patient ID: Stacey Palmer, female   DOB: 02/16/1981, 39 y.o.   MRN: [id number]  HPI Stacey Palmer is a 39 year old black female,single, G3P3 in for skin tag removal in left groin, it gets irritated and hurts. PCP is Dr Lodema Hong.  Review of Systems For skin tag removal Reviewed past medical,surgical, social and family history. Reviewed medications and allergies.     Objective:   Physical Exam BP 121/81 (BP Location: Left Arm, Patient Position: Sitting, Cuff Size: Large)   Pulse 85   Ht 5\' 7"  (1.702 m)   Wt 240 lb (108.9 kg)   LMP 09/28/2020   BMI 37.59 kg/m   Skin is warm and dry, has 2 mm skin tag left groin, consent signed, and time out called, cleaned area with alcohol swab and injected with 1 cc 2% lidocaine, used pick ups and then cut with iris scissors, minute blood,silver nitrated and band aide applied and skin sent to pathology.  Examination chaperoned by 09/30/2020 LPN.  AA is 2 Fall risk is hight  PHQ 9 score is 0  Upstream - 10/01/20 1513      Pregnancy Intention Screening   Does the patient want to become pregnant in the next year? No    Does the patient's partner want to become pregnant in the next year? No    Would the patient like to discuss contraceptive options today? No      Contraception Wrap Up   Current Method Female Sterilization    End Method Female Sterilization    Contraception Counseling Provided No             Assessment:     Skin tag, removed    Plan:     Skin tag to pathology Return in 1 year for pap and physical at her request

## 2020-10-10 ENCOUNTER — Telehealth (HOSPITAL_COMMUNITY): Payer: Self-pay | Admitting: Psychiatry

## 2020-10-10 ENCOUNTER — Other Ambulatory Visit: Payer: Self-pay

## 2020-10-10 ENCOUNTER — Ambulatory Visit (HOSPITAL_COMMUNITY): Payer: 59 | Admitting: Psychiatry

## 2020-10-10 NOTE — Telephone Encounter (Signed)
Therapist attempted to contact patient twice via text through care agility platform for scheduled appointment, no response.  Therapist called patient and left message indicating attempt and requesting patient call office.

## 2020-10-24 ENCOUNTER — Other Ambulatory Visit: Payer: Self-pay

## 2020-10-24 ENCOUNTER — Ambulatory Visit (HOSPITAL_COMMUNITY): Payer: 59 | Admitting: Psychiatry

## 2020-10-24 ENCOUNTER — Telehealth (HOSPITAL_COMMUNITY): Payer: Self-pay | Admitting: Psychiatry

## 2020-10-24 NOTE — Telephone Encounter (Signed)
Therapist attempted to contact patient twice via text through caregility platform, no response.  Therapist called patient and left a voicemail message indicating attempt and requesting patient call office.

## 2020-11-05 ENCOUNTER — Telehealth (HOSPITAL_COMMUNITY): Payer: Self-pay | Admitting: *Deleted

## 2020-11-05 NOTE — Telephone Encounter (Signed)
Patient called and [facility] stating she would like to change her appt with Gigi Gin for Jan 5th. Per pt on voicemail , she either want to change the appt time and/or the appt date due to not being able to do that time that's scheduled for Jan 5th.  Per pt she would like a call back  Staff called patient and was not able to reach pt and pt Voicemail Box was full per message. Appt will be cancelled per pt voicemail request.

## 2020-11-07 ENCOUNTER — Ambulatory Visit (HOSPITAL_COMMUNITY): Payer: 59 | Admitting: Psychiatry

## 2020-11-07 ENCOUNTER — Other Ambulatory Visit: Payer: Self-pay

## 2020-11-07 ENCOUNTER — Ambulatory Visit (INDEPENDENT_AMBULATORY_CARE_PROVIDER_SITE_OTHER): Payer: 59 | Admitting: Psychiatry

## 2020-11-07 DIAGNOSIS — F411 Generalized anxiety disorder: Secondary | ICD-10-CM

## 2020-11-07 DIAGNOSIS — F324 Major depressive disorder, single episode, in partial remission: Secondary | ICD-10-CM | POA: Diagnosis not present
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
## 2020-11-08 ENCOUNTER — Ambulatory Visit (INDEPENDENT_AMBULATORY_CARE_PROVIDER_SITE_OTHER): Payer: 59 | Admitting: *Deleted

## 2020-11-08 ENCOUNTER — Other Ambulatory Visit: Payer: Self-pay

## 2020-11-08 ENCOUNTER — Encounter: Payer: Self-pay | Admitting: *Deleted

## 2020-11-08 VITALS — BP 135/88 | HR 77 | Ht 67.0 in | Wt 243.5 lb

## 2020-11-08 DIAGNOSIS — Z3201 Encounter for pregnancy test, result positive: Secondary | ICD-10-CM | POA: Diagnosis not present

## 2020-11-08 DIAGNOSIS — Z3202 Encounter for pregnancy test, result negative: Secondary | ICD-10-CM

## 2020-11-08 LAB — POCT URINE PREGNANCY: Preg Test, Ur: NEGATIVE

## 2020-11-08 NOTE — Progress Notes (Signed)
Chart reviewed for nurse visit. Agree with plan of care.  Adline Potter, NP 11/08/2020 4:13 PM

## 2020-11-08 NOTE — Progress Notes (Signed)
   NURSE VISIT- PREGNANCY CONFIRMATION   SUBJECTIVE:  Stacey Palmer is a 40 y.o. G13P3003 female at Unknown by certain LMP of Patient's last menstrual period was 10/21/2020. Here for pregnancy confirmation.  Home pregnancy test: positive x 2  She reports nausea, cramping and tender breasts.  She is not taking prenatal vitamins.    OBJECTIVE:  BP 135/88 (BP Location: Left Arm, Patient Position: Sitting, Cuff Size: Large)   Pulse 77   Ht 5\' 7"  (1.702 m)   Wt 243 lb 8 oz (110.5 kg)   LMP 10/21/2020   BMI 38.14 kg/m   Appears well, in no apparent distress OB History  Gravida Para Term Preterm AB Living  3 3 3     3   SAB IAB Ectopic Multiple Live Births          3    # Outcome Date GA Lbr Len/2nd Weight Sex Delivery Anes PTL Lv  3 Term     M CS-LTranv   LIV  2 Term     M Vag-Spont   LIV  1 Term     F Vag-Spont   LIV    Results for orders placed or performed in visit on 11/08/20 (from the past 24 hour(s))  POCT urine pregnancy   Collection Time: 11/08/20  3:12 PM  Result Value Ref Range   Preg Test, Ur Negative Negative    ASSESSMENT: positive pregnancy tests after tubal    PLAN: Schedule for dating ultrasound will wait for quant results Prenatal vitamins: await quant results   Nausea medicines: await quant results   OB packet given: No  01/06/21  11/08/2020 3:21 PM

## 2020-11-09 LAB — BETA HCG QUANT (REF LAB): hCG Quant: <1 m[IU]/mL

## 2020-11-12 ENCOUNTER — Telehealth: Payer: Self-pay

## 2020-11-12 NOTE — Telephone Encounter (Signed)
Returned pt's call to inform her of negative pregnancy testing. She stated that her last period was 10/21/20. I told her to retake a home urine pregnancy test if she had an actual missed period by 11/22/20.

## 2020-11-12 NOTE — Telephone Encounter (Signed)
Pt calling about labs that were done on 11/08/20

## 2020-11-19 ENCOUNTER — Other Ambulatory Visit: Payer: Self-pay

## 2020-11-19 ENCOUNTER — Telehealth (INDEPENDENT_AMBULATORY_CARE_PROVIDER_SITE_OTHER): Payer: 59 | Admitting: Nurse Practitioner

## 2020-11-19 ENCOUNTER — Encounter: Payer: Self-pay | Admitting: Nurse Practitioner

## 2020-11-19 DIAGNOSIS — J069 Acute upper respiratory infection, unspecified: Secondary | ICD-10-CM

## 2020-11-19 MED ORDER — HYDROCODONE-HOMATROPINE 5-1.5 MG/5ML PO SYRP: 5.0000 mL | ORAL_SOLUTION | Freq: Three times a day (TID) | ORAL | 0 refills | Status: DC | PRN

## 2020-11-19 MED ORDER — NOREL AD 4-10-325 MG PO TABS
1.0000 | ORAL_TABLET | ORAL | 1 refills | Status: DC | PRN
Start: 2020-11-19 — End: 2020-11-20

## 2020-11-19 NOTE — Assessment & Plan Note (Signed)
-  symptomatic x 2 days -Rx. norel -Rx. Hycodan She will call back if symptoms get worse or persist through Friday 11/23/20 -we discussed COVID testing, and she states her son was tested and she is waiting on results, but he is sick too

## 2020-11-19 NOTE — Progress Notes (Signed)
Acute Office Visit  Subjective:    Patient ID: Stacey Palmer, female    DOB: 1981-08-02, 40 y.o.   MRN: [id number]  Chief Complaint  Patient presents with  . Cough    X 2 days  . Nasal Congestion    X 2 days  . Sore Throat    X 2 days  . Headache    X 2 days    Cough Associated symptoms include chills, ear pain, a fever, headaches, rhinorrhea and a sore throat. Pertinent negatives include no shortness of breath or wheezing.  Sore Throat  Associated symptoms include congestion, coughing, ear pain and headaches. Pertinent negatives include no shortness of breath.  Headache  Associated symptoms include coughing, ear pain, a fever, nausea, rhinorrhea, sinus pressure and a sore throat.   Patient is in today for sick visit. She has been sick for 2 days.  She had a recent sick contact, but they have not got their COVID results back yet. She has been taking robitussin cold.  Past Medical History:  Diagnosis Date  . Allergy   . Anxiety   . 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 Dry eyes 12/26/2018  . Obesity   . Sleep apnea     Past Surgical History:  Procedure Laterality Date  . BARIATRIC SURGERY  2018  . CESAREAN SECTION    . TUBAL LIGATION      Family History  Problem Relation Age of Onset  . Hypertension Mother   . Kidney disease Father   . Diabetes Father   . Breast cancer Paternal Aunt   . Dementia Paternal Grandmother   . Asthma Daughter     Social History   Socioeconomic History  . Marital status: Single    Spouse name: Not on file  . Number of children: Not on file  . Years of education: Not on file  . Highest education level: Not on file  Occupational History  . Not on file  Tobacco Use  . Smoking status: Never Smoker  . Smokeless tobacco: Never Used  Vaping Use  . Vaping Use: Never used  Substance and Sexual Activity  . Alcohol use: No  .  Drug use: No  . Sexual activity: Yes    Birth control/protection: Surgical    Comment: tubal  Other Topics Concern  . Not on file  Social History Narrative  . Not on file   Social Determinants of Health   Financial Resource Strain: Medium Risk  . Difficulty of Paying Living Expenses: Somewhat hard  Food Insecurity: No Food Insecurity  . Worried About Programme researcher, broadcasting/film/video in the Last Year: Never true  . Ran Out of Food in the Last Year: Never true  Transportation Needs: No Transportation Needs  . Lack of Transportation (Medical): No  . Lack of Transportation (Non-Medical): No  Physical Activity: Sufficiently Active  . Days of Exercise per Week: 7 days  . Minutes of Exercise per Session: 30 min  Stress: Stress Concern Present  . Feeling of Stress : Rather much  Social Connections: Moderately Integrated  . Frequency of Communication with Friends and Family: Twice a week  . Frequency of Social Gatherings with Friends and Family: Twice a week  . Attends Religious Services: More than 4 times per year  . Active Member of Clubs or Organizations: No  . Attends Banker Meetings:  1 to 4 times per year  . Marital Status: Never married  Intimate Partner Violence: Not At Risk  . Fear of Current or Ex-Partner: No  . Emotionally Abused: No  . Physically Abused: No  . Sexually Abused: No    Outpatient Medications Prior to Visit  Medication Sig Dispense Refill  . fluticasone (FLONASE) 50 MCG/ACT nasal spray Place 2 sprays into both nostrils daily. 16 g 0  . levocetirizine (XYZAL) 5 MG tablet Take 1 tablet (5 mg total) by mouth every evening. 30 tablet 1  . pantoprazole (PROTONIX) 40 MG tablet Take 1 tablet (40 mg total) by mouth daily. 30 tablet 3  . phentermine (ADIPEX-P) 37.5 MG tablet Take one half tablet by mouth every morning with breakfast 30 tablet 1  . venlafaxine XR (EFFEXOR-XR) 75 MG 24 hr capsule Take 1 capsule (75 mg total) by mouth daily with breakfast. Dose  increase effective 03/24/2019 30 capsule 3   No facility-administered medications prior to visit.    No Known Allergies  Review of Systems  Constitutional: Positive for chills, fatigue and fever.  HENT: Positive for congestion, ear pain, rhinorrhea, sinus pressure, sinus pain, sneezing and sore throat.   Respiratory: Positive for cough. Negative for chest tightness, shortness of breath and wheezing.   Cardiovascular: Negative.   Gastrointestinal: Positive for nausea.       Loose stools, stomach feels "funny"  Neurological: Positive for headaches.       Objective:    Physical Exam  LMP 10/21/2020  Wt Readings from Last 3 Encounters:  11/08/20 243 lb 8 oz (110.5 kg)  10/01/20 240 lb (108.9 kg)  09/18/20 234 lb 14.4 oz (106.5 kg)    Health Maintenance Due  Topic Date Due  . Hepatitis C Screening  Never done  . COVID-19 Vaccine (3 - Moderna risk 4-dose series) 02/03/2020    There are no preventive care reminders to display for this patient.   Lab Results  Component Value Date   TSH 1.36 08/19/2018   Lab Results  Component Value Date   WBC 4.1 04/07/2020   HGB 13.3 04/07/2020   HCT 40.2 04/07/2020   MCV 95.5 04/07/2020   PLT 290 04/07/2020   Lab Results  Component Value Date   NA 138 04/07/2020   K 3.8 04/07/2020   CO2 24 04/07/2020   GLUCOSE 93 04/07/2020   BUN 10 04/07/2020   CREATININE 0.78 04/07/2020   BILITOT 0.8 04/07/2020   ALKPHOS 46 04/07/2020   AST 14 (L) 04/07/2020   ALT 11 04/07/2020   PROT 7.1 04/07/2020   ALBUMIN 3.5 04/07/2020   CALCIUM 9.1 04/07/2020   ANIONGAP 8 04/07/2020   Lab Results  Component Value Date   CHOL 180 08/19/2018   Lab Results  Component Value Date   HDL 83 08/19/2018   Lab Results  Component Value Date   LDLCALC 84 08/19/2018   Lab Results  Component Value Date   TRIG 53 08/19/2018   Lab Results  Component Value Date   CHOLHDL 2.2 08/19/2018   Lab Results  Component Value Date   HGBA1C 4.9  06/04/2017       Assessment & Plan:   Problem List Items Addressed This Visit      Respiratory   URI (upper respiratory infection) - Primary    -symptomatic x 2 days -Rx. norel -Rx. Hycodan She will call back if symptoms get worse or persist through Friday 11/23/20 -we discussed COVID testing, and she states her son was  tested and she is waiting on results, but he is sick too          Meds ordered this encounter  Medications  . Chlorphen-PE-Acetaminophen (NOREL AD) 4-10-325 MG TABS    Sig: Take 1 tablet by mouth every 4 (four) hours as needed (nasal congestion, cold symptoms).    Dispense:  20 tablet    Refill:  1  . HYDROcodone-homatropine (HYCODAN) 5-1.5 MG/5ML syrup    Sig: Take 5 mLs by mouth every 8 (eight) hours as needed for cough.    Dispense:  120 mL    Refill:  0   Date:  11/19/2020   Location of Patient: Home Location of Provider: Office Consent was obtain for visit to be over via telehealth. I verified that I am speaking with the correct person using two identifiers.  I connected with  [name] on 11/19/20 via telephone and verified that I am speaking with the correct person using two identifiers.   I discussed the limitations of evaluation and management by telemedicine. The patient expressed understanding and agreed to proceed.  Time spent: 12 minutes   Heather Roberts, NP

## 2020-11-20 ENCOUNTER — Telehealth: Payer: Self-pay

## 2020-11-20 ENCOUNTER — Other Ambulatory Visit: Payer: Self-pay

## 2020-11-20 ENCOUNTER — Other Ambulatory Visit: Payer: Self-pay | Admitting: Nurse Practitioner

## 2020-11-20 ENCOUNTER — Ambulatory Visit (HOSPITAL_COMMUNITY): Payer: 59 | Admitting: Psychiatry

## 2020-11-20 MED ORDER — HYDROCODONE-HOMATROPINE 5-1.5 MG/5ML PO SYRP: 5.0000 mL | ORAL_SOLUTION | Freq: Three times a day (TID) | ORAL | 0 refills | Status: DC | PRN

## 2020-11-20 MED ORDER — NOREL AD 4-10-325 MG PO TABS: 1.0000 | ORAL_TABLET | ORAL | 1 refills | Status: DC | PRN

## 2020-11-20 NOTE — Telephone Encounter (Signed)
Can you resend prescriptions from yesterday?

## 2020-11-20 NOTE — Progress Notes (Signed)
Pt states she didn't get meds that were called in yesterday. No fill per PMP Aware database. Called [facility], and no answer from pharmacy.

## 2020-11-20 NOTE — Telephone Encounter (Signed)
Please call the pt her medication was not at the pharmacy that was called in from the visit

## 2020-11-20 NOTE — Telephone Encounter (Signed)
I called the pharmacy, but they didn't pick up. I sat on hold for over 6 minutes. I don't know if they have anyone working. But I resent the meds to [facility] pharmacy

## 2020-11-21 ENCOUNTER — Other Ambulatory Visit: Payer: Self-pay

## 2020-11-21 ENCOUNTER — Other Ambulatory Visit: Payer: Self-pay | Admitting: Nurse Practitioner

## 2020-11-21 ENCOUNTER — Telehealth: Payer: Self-pay

## 2020-11-21 DIAGNOSIS — J069 Acute upper respiratory infection, unspecified: Secondary | ICD-10-CM

## 2020-11-21 MED ORDER — NOREL AD 4-10-325 MG PO TABS: 1.0000 | ORAL_TABLET | ORAL | 1 refills | Status: DC | PRN

## 2020-11-21 MED ORDER — HYDROCODONE-HOMATROPINE 5-1.5 MG/5ML PO SYRP: 5.0000 mL | ORAL_SOLUTION | Freq: Three times a day (TID) | ORAL | 0 refills | Status: DC | PRN

## 2020-11-21 MED ORDER — PREDNISONE 10 MG PO TABS: ORAL_TABLET | ORAL | 0 refills | Status: AC

## 2020-11-21 NOTE — Telephone Encounter (Signed)
Sent!

## 2020-11-21 NOTE — Telephone Encounter (Signed)
I sent in prednisone for her. It may help with the body aches. The norel will also help with the symptoms.

## 2020-11-21 NOTE — Telephone Encounter (Signed)
Can you please re-send rx's from yesterday's phone visit to [facility] instead, [facility] pharmacy told her they were unable to get the prescriptions.

## 2020-11-21 NOTE — Telephone Encounter (Signed)
Pt is calling to advise that she is Positive for Covid,   Itching, high fever, chest aches, and  Throat and ears are hurting

## 2020-11-21 NOTE — Telephone Encounter (Signed)
Pt was seen yesterday via phone.Marland Kitchen FYI

## 2020-11-22 ENCOUNTER — Ambulatory Visit: Payer: Self-pay

## 2020-11-22 NOTE — Telephone Encounter (Signed)
Pt informed

## 2020-11-26 ENCOUNTER — Telehealth (INDEPENDENT_AMBULATORY_CARE_PROVIDER_SITE_OTHER): Payer: 59 | Admitting: Family Medicine

## 2020-11-26 ENCOUNTER — Other Ambulatory Visit: Payer: 59

## 2020-11-26 ENCOUNTER — Other Ambulatory Visit: Payer: Self-pay

## 2020-11-26 DIAGNOSIS — U071 COVID-19: Secondary | ICD-10-CM | POA: Diagnosis not present

## 2020-11-26 MED ORDER — PREDNISONE 5 MG PO TABS: 5.0000 mg | ORAL_TABLET | Freq: Two times a day (BID) | ORAL | 0 refills | Status: AC

## 2020-11-26 MED ORDER — BENZONATATE 100 MG PO CAPS: 100.0000 mg | ORAL_CAPSULE | Freq: Two times a day (BID) | ORAL | 0 refills | Status: DC | PRN

## 2020-11-26 MED ORDER — ALBUTEROL SULFATE HFA 108 (90 BASE) MCG/ACT IN AERS: 2.0000 | INHALATION_SPRAY | Freq: Four times a day (QID) | RESPIRATORY_TRACT | 0 refills | Status: DC | PRN

## 2020-11-26 MED ORDER — ONDANSETRON HCL 4 MG PO TABS: ORAL_TABLET | ORAL | 0 refills | Status: DC

## 2020-11-26 MED ORDER — AZITHROMYCIN 250 MG PO TABS: ORAL_TABLET | ORAL | 0 refills | Status: DC

## 2020-11-26 NOTE — Progress Notes (Signed)
Virtual Visit via Telephone Note  I connected with Stacey Palmer on 11/26/20 at  8:20 AM EST by telephone and verified that I am speaking with the correct person using two identifiers.  Location: Patient: home Provider: work   I discussed the limitations, risks, security and privacy concerns of performing an evaluation and management service by telephone and the availability of in person appointments. I also discussed with the patient that there may be a patient responsible charge related to this service. The patient expressed understanding and agreed to proceed.   History of Present Illness: Dx with covid on 01/19, 2 of her 3 family members are also sick and positive, Stacey Palmer has had her booster in 08/2020. Fever yesterday up to 101, has had chills and fever from the day of diagnosis, was up to 102, cough and generalized body aches since dx, aching mainly in chest, was entire  Body, sputum is yellow past 7 to 10 days, wheezes at times  C/o headache , burning nose, nostrils dry, can't breath , no taste or smell Nausea and vomit x 4 days , nausea persisting, vomited up to 1 day ago, vomiting twice yesterday ,Griping in stomach stool is soft and watery and generally 2 / day   Observations/Objective: There were no vitals taken for this visit. Good co+mmunication with no confusion and intact memory. Alert sounds ill, congested with intermittent cough.    Assessment and Plan:  COVID-19 virus infection Remains symptomatic with infection, febrile up to 1 day ago, reportedly, also has headache, GI symptoms, cough , body aches, out of work until 1 week, when re evaluated ' Follow Up Instructions:    I discussed the assessment and treatment plan with the patient. The patient was provided an opportunity to ask questions and all were answered. The patient agreed with the plan and demonstrated an understanding of the instructions.   The patient was advised to call back or seek an in-person  evaluation if the symptoms worsen or if the condition fails to improve as anticipated.  I provided 11 minutes of non-face-to-face time during this encounter.   Syliva Overman, MD

## 2020-11-26 NOTE — Patient Instructions (Signed)
F/U in 1 week, video. Call if you need me before  Work excuse until re evaluated in 1 week  Medications are prescribed for symptoms, zofran, prednisone, albuterol MDI and tessalon perles  Please continue to self isolate as you have cough and fever up to yesterday  Ensure water intake of 64 ounces or more  Please use tylenol 325 mg one every 6 to 8 hours as needed, for fever and pain  Hope you feel better soon

## 2020-11-27 ENCOUNTER — Telehealth: Payer: Self-pay

## 2020-11-27 NOTE — Telephone Encounter (Signed)
fmla forms received via fax  Copied Noted  Sleeved   

## 2020-11-29 ENCOUNTER — Other Ambulatory Visit: Payer: Self-pay

## 2020-11-29 ENCOUNTER — Telehealth (HOSPITAL_COMMUNITY): Payer: Self-pay | Admitting: Psychiatry

## 2020-11-29 ENCOUNTER — Other Ambulatory Visit: Payer: 59

## 2020-11-29 DIAGNOSIS — Z20822 Contact with and (suspected) exposure to covid-19: Secondary | ICD-10-CM

## 2020-11-29 NOTE — Telephone Encounter (Signed)
Therapist returned call to patient who reports being  home sick with COVID-19.  All of her children have COVID  as well per patient's report.  She reports feeling overwhelmed and worried about not working.  She also reports feeling depressed as she continues to ruminate over the last man she dated.  She denies any suicidal or homicidal thoughts.  Therapist encouraged patient to focus on taking care of self and children, prioritize her worries.  Patient also encouraged patient to use her strengths and spirituality.  Patient agreed to keep scheduled appointment on Tuesday, February 1 at 4:00 p.m.

## 2020-11-30 DIAGNOSIS — U071 COVID-19: Secondary | ICD-10-CM | POA: Insufficient documentation

## 2020-12-01 ENCOUNTER — Encounter: Payer: Self-pay | Admitting: Family Medicine

## 2020-12-01 NOTE — Assessment & Plan Note (Signed)
Remains symptomatic with infection, febrile up to 1 day ago, reportedly, also has headache, GI symptoms, cough , body aches, out of work until 1 week, when re evaluated

## 2020-12-02 ENCOUNTER — Ambulatory Visit (INDEPENDENT_AMBULATORY_CARE_PROVIDER_SITE_OTHER): Payer: 59

## 2020-12-02 ENCOUNTER — Ambulatory Visit
Admission: RE | Admit: 2020-12-02 | Discharge: 2020-12-02 | Disposition: A | Discharge status: Home / Self Care | Payer: 59 | Source: Ambulatory Visit | Attending: Emergency Medicine | Admitting: Emergency Medicine

## 2020-12-02 ENCOUNTER — Other Ambulatory Visit: Payer: Self-pay

## 2020-12-02 VITALS — BP 122/82 | HR 65 | Temp 97.5°F | Resp 18

## 2020-12-02 DIAGNOSIS — U099 Post covid-19 condition, unspecified: Secondary | ICD-10-CM

## 2020-12-02 DIAGNOSIS — R112 Nausea with vomiting, unspecified: Secondary | ICD-10-CM | POA: Diagnosis not present

## 2020-12-02 DIAGNOSIS — U071 COVID-19: Secondary | ICD-10-CM

## 2020-12-02 DIAGNOSIS — R053 Chronic cough: Secondary | ICD-10-CM | POA: Diagnosis not present

## 2020-12-02 DIAGNOSIS — R531 Weakness: Secondary | ICD-10-CM | POA: Diagnosis not present

## 2020-12-02 LAB — NOVEL CORONAVIRUS, NAA: SARS-CoV-2, NAA: DETECTED — AB

## 2020-12-02 MED ORDER — ALBUTEROL SULFATE HFA 108 (90 BASE) MCG/ACT IN AERS
1.0000 | INHALATION_SPRAY | Freq: Four times a day (QID) | RESPIRATORY_TRACT | 0 refills | Status: DC | PRN
Start: 2020-12-02 — End: 2021-01-31

## 2020-12-02 MED ORDER — DEXAMETHASONE 4 MG PO TABS: 4.0000 mg | ORAL_TABLET | Freq: Every day | ORAL | 0 refills | Status: AC

## 2020-12-02 NOTE — ED Provider Notes (Signed)
[facility]   [id number] 12/02/20 Arrival Time: 0843   CC: COVID   SUBJECTIVE: History from: patient.  Stacey Palmer is a 40 y.o. female who p who presented to the urgent care for complaint of Covid positive x10 days.  She is still complaining of chills, fever, cough, nausea and weakness.  Denies sick exposure to flu or strep.  Denies recent travel.  Has tried Tessalon, prednisone, Zofran, azithromycin, without relief.  Denies alleviating or aggravating factors.  Denies previous symptoms in the past.   Denies fever, chills, fatigue, sinus pain, rhinorrhea, sore throat, SOB, wheezing, chest pain,  changes in bowel or bladder habits.     ROS: As per HPI.  All other pertinent ROS negative.      Past Medical History:  Diagnosis Date  . Allergy   . Anxiety   . 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 Dry eyes 12/26/2018  . Obesity   . Sleep apnea    Past Surgical History:  Procedure Laterality Date  . BARIATRIC SURGERY  2018  . CESAREAN SECTION    . TUBAL LIGATION     No Known Allergies No current facility-administered medications on file prior to encounter.   Current Outpatient Medications on File Prior to Encounter  Medication Sig Dispense Refill  . azithromycin (ZITHROMAX) 250 MG tablet Take two tablets on day one, then take one tablet once daily for an additional 4 days 6 tablet 0  . benzonatate (TESSALON) 100 MG capsule Take 1 capsule (100 mg total) by mouth 2 (two) times daily as needed for cough. 20 capsule 0  . Chlorphen-PE-Acetaminophen (NOREL AD) 4-10-325 MG TABS Take 1 tablet by mouth every 4 (four) hours as needed (nasal congestion, cold symptoms). 20 tablet 1  . fluticasone (FLONASE) 50 MCG/ACT nasal spray Place 2 sprays into both nostrils daily. 16 g 0  . HYDROcodone-homatropine (HYCODAN) 5-1.5 MG/5ML syrup Take 5 mLs by mouth every 8 (eight) hours as  needed for cough. 120 mL 0  . levocetirizine (XYZAL) 5 MG tablet Take 1 tablet (5 mg total) by mouth every evening. 30 tablet 1  . ondansetron (ZOFRAN) 4 MG tablet Take one tablet by mouth evry 8 hours, as needed, for nausea 20 tablet 0  . pantoprazole (PROTONIX) 40 MG tablet Take 1 tablet (40 mg total) by mouth daily. 30 tablet 3  . phentermine (ADIPEX-P) 37.5 MG tablet Take one half tablet by mouth every morning with breakfast 30 tablet 1  . venlafaxine XR (EFFEXOR-XR) 75 MG 24 hr capsule Take 1 capsule (75 mg total) by mouth daily with breakfast. Dose increase effective 03/24/2019 30 capsule 3   Social History   Socioeconomic History  . Marital status: Single    Spouse name: Not on file  . Number of children: Not on file  . Years of education: Not on file  . Highest education level: Not on file  Occupational History  . Not on file  Tobacco Use  . Smoking status: Never Smoker  . Smokeless tobacco: Never Used  Vaping Use  . Vaping Use: Never used  Substance and Sexual Activity  . Alcohol use: No  . Drug use: No  . Sexual activity: Yes    Birth control/protection: Surgical    Comment: tubal  Other Topics Concern  . Not on file  Social History Narrative  . Not on file  Social Determinants of Health   Financial Resource Strain: Medium Risk  . Difficulty of Paying Living Expenses: Somewhat hard  Food Insecurity: No Food Insecurity  . Worried About Programme researcher, broadcasting/film/video in the Last Year: Never true  . Ran Out of Food in the Last Year: Never true  Transportation Needs: No Transportation Needs  . Lack of Transportation (Medical): No  . Lack of Transportation (Non-Medical): No  Physical Activity: Sufficiently Active  . Days of Exercise per Week: 7 days  . Minutes of Exercise per Session: 30 min  Stress: Stress Concern Present  . Feeling of Stress : Rather much  Social Connections: Moderately Integrated  . Frequency of Communication with Friends and Family: Twice a week  .  Frequency of Social Gatherings with Friends and Family: Twice a week  . Attends Religious Services: More than 4 times per year  . Active Member of Clubs or Organizations: No  . Attends Banker Meetings: 1 to 4 times per year  . Marital Status: Never married  Intimate Partner Violence: Not At Risk  . Fear of Current or Ex-Partner: No  . Emotionally Abused: No  . Physically Abused: No  . Sexually Abused: No   Family History  Problem Relation Age of Onset  . Hypertension Mother   . Kidney disease Father   . Diabetes Father   . Breast cancer Paternal Aunt   . Dementia Paternal Grandmother   . Asthma Daughter     OBJECTIVE:  Vitals:   12/02/20 0857  BP: 122/82  Pulse: 65  Resp: 18  Temp: (!) 97.5 F (36.4 C)  TempSrc: Oral  SpO2: 97%     General appearance: alert; appears fatigued, but nontoxic; speaking in full sentences and tolerating own secretions HEENT: NCAT; Ears: EACs clear, TMs pearly gray; Eyes: PERRL.  EOM grossly intact. Sinuses: nontender; Nose: nares patent without rhinorrhea, Throat: oropharynx clear, tonsils non erythematous or enlarged, uvula midline  Neck: supple without LAD Lungs: unlabored respirations, symmetrical air entry; cough: moderate; no respiratory distress; CTAB Heart: regular rate and rhythm.  Radial pulses 2+ symmetrical bilaterally Skin: warm and dry Psychological: alert and cooperative; normal mood and affect  LABS:  No results found for this or any previous visit (from the past 24 hour(s)).   RADIOLOGY:  DG Chest 2 View  Result Date: 12/02/2020 CLINICAL DATA:  COVID-19 positive 10 days ago, cough, weakness, shortness of breath, fatigue EXAM: CHEST - 2 VIEW COMPARISON:  08/31/2018 FINDINGS: Normal heart size, mediastinal contours, and pulmonary vascularity. Minimal chronic peribronchial thickening. Lungs otherwise clear. No acute infiltrate, pleural effusion, or pneumothorax. Osseous structures unremarkable. IMPRESSION: No  acute abnormalities. Electronically Signed   By: Ulyses Southward M.D.   On: 12/02/2020 09:29   chest X-ray is negative for acute cardiopulmonary disease.  I have reviewed the x-ray myself and the radiologist interpretation.  I am in agreement with the radiologist interpretation.   ASSESSMENT & PLAN:  1. COVID-19 virus infection   2. Post-COVID chronic cough   3. Non-intractable vomiting with nausea, unspecified vomiting type   4. Weakness     Meds ordered this encounter  Medications  . albuterol (VENTOLIN HFA) 108 (90 Base) MCG/ACT inhaler    Sig: Inhale 1-2 puffs into the lungs every 6 (six) hours as needed.    Dispense:  18 g    Refill:  0  . dexamethasone (DECADRON) 4 MG tablet    Sig: Take 1 tablet (4 mg total) by mouth daily for  7 days.    Dispense:  7 tablet    Refill:  0    Discharge Instructions    Get plenty of rest and push fluids Continue to take Tessalon Perles (benzonatate) every 4 hours as needed for cough Continue to take Zofran for nausea.  Wait 30 minutes after taking medication before eating Albuterol was prescribed/take as directed  Decadron was prescribed use medications daily for symptom relief Use OTC medications like ibuprofen or tylenol as needed fever or pain Call or go to the ED if you have any new or worsening symptoms such as fever, worsening cough, shortness of breath, chest tightness, chest pain, turning blue, changes in mental status, etc...   Reviewed expectations re: course of current medical issues. Questions answered. Outlined signs and symptoms indicating need for more acute intervention. Patient verbalized understanding. After Visit Summary given.         Durward Parcel, FNP 12/02/20 [phone]

## 2020-12-02 NOTE — ED Triage Notes (Signed)
Positive for covid 11/22/2020.  Pt still having cough, chills, fever, nausea and weakness.

## 2020-12-02 NOTE — Discharge Instructions (Addendum)
Get plenty of rest and push fluids Continue to take Tessalon Perles (benzonatate) every 4 hours as needed for cough Continue to take Hycodan as prescribed for cough Continue to take Zofran for nausea.  Wait 30 minutes after taking medication before eating Albuterol was prescribed/take as directed  Decadron was prescribed use medications daily for symptom relief Use OTC medications like ibuprofen or tylenol as needed fever or pain Call or go to the ED if you have any new or worsening symptoms such as fever, worsening cough, shortness of breath, chest tightness, chest pain, turning blue, changes in mental status, etc..Marland Kitchen

## 2020-12-03 ENCOUNTER — Telehealth (INDEPENDENT_AMBULATORY_CARE_PROVIDER_SITE_OTHER): Payer: 59 | Admitting: Family Medicine

## 2020-12-03 ENCOUNTER — Telehealth: Payer: Self-pay

## 2020-12-03 ENCOUNTER — Encounter: Payer: Self-pay | Admitting: Family Medicine

## 2020-12-03 VITALS — Ht 67.0 in | Wt 241.0 lb

## 2020-12-03 DIAGNOSIS — U071 COVID-19: Secondary | ICD-10-CM

## 2020-12-03 MED ORDER — PREDNISONE 5 MG PO TABS: 5.0000 mg | ORAL_TABLET | Freq: Two times a day (BID) | ORAL | 0 refills | Status: DC

## 2020-12-03 NOTE — Telephone Encounter (Signed)
fmla forms received  Copied Noted Sleeved  

## 2020-12-03 NOTE — Progress Notes (Signed)
Virtual Visit via Telephone Note  I connected with [name] on 12/03/20 at  3:40 PM EST by telephone and verified that I am speaking with the correct person using two identifiers.  Location: Patient: home Provider: work   I discussed the limitations, risks, security and privacy concerns of performing an evaluation and management service by telephone and the availability of in person appointments. I also discussed with the patient that there may be a patient responsible charge related to this service. The patient expressed understanding and agreed to proceed.   History of Present Illness: C/o ongoing chills , fever and cough following covid dx on 12/01/2020, was seen in urgent  Care 1 day ago and steroid prescribed, unable to return to work as originally planned C/o poor appetite and fatigue, also generalized aches    Observations/Objective: Ht 5\' 7"  (1.702 m)   Wt 241 lb (109.3 kg)   BMI 37.75 kg/m  Good communication with no confusion and intact memory. Alert and oriented x 3 No signs of respiratory distress during speech    Assessment and Plan: COVID-19 virus infection Symptomatic with cough, chills, fatigue and poor appetite,  Unable to return to work, re eval in 1 week    Follow Up Instructions:    I discussed the assessment and treatment plan with the patient. The patient was provided an opportunity to ask questions and all were answered. The patient agreed with the plan and demonstrated an understanding of the instructions.   The patient was advised to call back or seek an in-person evaluation if the symptoms worsen or if the condition fails to improve as anticipated.  I provided 11  minutes of non-face-to-face time during this encounter.   , MD

## 2020-12-03 NOTE — Assessment & Plan Note (Signed)
Symptomatic with cough, chills, fatigue and poor appetite,  Unable to return to work, re eval in 1 week

## 2020-12-03 NOTE — Telephone Encounter (Signed)
Called to discuss with patient about COVID-19 symptoms and the use of one of the available treatments for those with mild to moderate Covid symptoms and at a high risk of hospitalization.  Pt appears to qualify for outpatient treatment due to co-morbid conditions and/or a member of an at-risk group in accordance with the FDA Emergency Use Authorization.    Symptom onset: 11/22/20 per chart Vaccinated: Yes Booster? Yes Immunocompromised? No Qualifiers: Obesity  Pt. Is out of 7 day window for treatment.  Stacey Palmer

## 2020-12-03 NOTE — Patient Instructions (Addendum)
F/U in 1 week , call if you need me sooner  Please drink a lot of fluids, get rest , and use tylenol forchest pain associated with excessive cough  Go to the ED if symptoms worse.  I anticipate a return to work in 1 week  Use albuterol three times daily  Take dexamethasone as prescribed for cough    10 Things You Can Do to Manage Your COVID-19 Symptoms at Home If you have possible or confirmed COVID-19: 1. Stay home except to get medical care. 2. Monitor your symptoms carefully. If your symptoms get worse, call your healthcare provider immediately. 3. Get rest and stay hydrated. 4. If you have a medical appointment, call the healthcare provider ahead of time and tell them that you have or may have COVID-19. 5. For medical emergencies, call 911 and notify the dispatch personnel that you have or may have COVID-19. 6. Cover your cough and sneezes with a tissue or use the inside of your elbow. 7. Wash your hands often with soap and water for at least 20 seconds or clean your hands with an alcohol-based hand sanitizer that contains at least 60% alcohol. 8. As much as possible, stay in a specific room and away from other people in your home. Also, you should use a separate bathroom, if available. If you need to be around other people in or outside of the home, wear a mask. 9. Avoid sharing personal items with other people in your household, like dishes, towels, and bedding. 10. Clean all surfaces that are touched often, like counters, tabletops, and doorknobs. Use household cleaning sprays or wipes according to the label instructions. [employer] 05/18/2020 This information is not intended to replace advice given to you by your health care provider. Make sure you discuss any questions you have with your health care provider. Document Revised: 09/03/2020 Document Reviewed: 09/03/2020 Elsevier Patient Education  2021 [employer].

## 2020-12-04 ENCOUNTER — Ambulatory Visit (HOSPITAL_COMMUNITY): Payer: 59 | Admitting: Psychiatry

## 2020-12-10 ENCOUNTER — Encounter: Payer: Self-pay | Admitting: Family Medicine

## 2020-12-10 ENCOUNTER — Other Ambulatory Visit: Payer: Self-pay

## 2020-12-10 ENCOUNTER — Telehealth (INDEPENDENT_AMBULATORY_CARE_PROVIDER_SITE_OTHER): Payer: 59 | Admitting: Family Medicine

## 2020-12-10 VITALS — Ht 68.0 in | Wt 234.0 lb

## 2020-12-10 DIAGNOSIS — U071 COVID-19: Secondary | ICD-10-CM

## 2020-12-10 DIAGNOSIS — R109 Unspecified abdominal pain: Secondary | ICD-10-CM

## 2020-12-10 MED ORDER — DICYCLOMINE HCL 10 MG PO CAPS: ORAL_CAPSULE | ORAL | 0 refills | Status: DC

## 2020-12-10 NOTE — Progress Notes (Signed)
Virtual Visit via Telephone Note  I connected with Stacey Palmer on 12/10/20 at  3:00 PM EST by telephone and verified that I am speaking with the correct person using two identifiers.  Location: Patient: home Provider: office   I discussed the limitations, risks, security and privacy concerns of performing an evaluation and management service by telephone and the availability of in person appointments. I also discussed with the patient that there may be a patient responsible charge related to this service. The patient expressed understanding and agreed to proceed.   History of Present Illness: Repports she had a fever up to 102 last night having chills Had headache 2 days ago  C/o diarrheah and stomach cramps   Observations/Objective:  Ht 5\' 8"  (1.727 m)   Wt 234 lb (106.1 kg)   LMP 12/03/2020   BMI 35.58 kg/m  Good communication with no confusion and intact memory. Alert and oriented x 3 No signs of respiratory distress during speech   Assessment and Plan: COVID-19 virus infection Reports recent fever and new abdominal pain with loose stool, dicyclomine prescribed for abdominal cramps, work excuse extended to 2/10. Advised urgent care eval for persistent fever or worsening new or current symptoms  Abdominal cramps Symptomatic treatment with dicyclomine and Amodio diet advised    Follow Up Instructions:    I discussed the assessment and treatment plan with the patient. The patient was provided an opportunity to ask questions and all were answered. The patient agreed with the plan and demonstrated an understanding of the instructions.   The patient was advised to call back or seek an in-person evaluation if the symptoms worsen or if the condition fails to improve as anticipated.  I provided 11 minutes of non-face-to-face time during this encounter.   4/10, MD

## 2020-12-10 NOTE — Patient Instructions (Addendum)
F/U in March as before  Work excuse for Jan 24, to return Feb 10  Medication  is prescribed for abdominal cramps and is dicyclomine  You need to go to urgent care if you develop fever between now and Thursday,you should not be having fever  from covid contracted 11 days ago still  10 Things You Can Do to Manage Your COVID-19 Symptoms at Home If you have possible or confirmed COVID-19: 1. Stay home except to get medical care. 2. Monitor your symptoms carefully. If your symptoms get worse, call your healthcare provider immediately. 3. Get rest and stay hydrated. 4. If you have a medical appointment, call the healthcare provider ahead of time and tell them that you have or may have COVID-19. 5. For medical emergencies, call 911 and notify the dispatch personnel that you have or may have COVID-19. 6. Cover your cough and sneezes with a tissue or use the inside of your elbow. 7. Wash your hands often with soap and water for at least 20 seconds or clean your hands with an alcohol-based hand sanitizer that contains at least 60% alcohol. 8. As much as possible, stay in a specific room and away from other people in your home. Also, you should use a separate bathroom, if available. If you need to be around other people in or outside of the home, wear a mask. 9. Avoid sharing personal items with other people in your household, like dishes, towels, and bedding. 10. Clean all surfaces that are touched often, like counters, tabletops, and doorknobs. Use household cleaning sprays or wipes according to the label instructions. [employer] 05/18/2020 This information is not intended to replace advice given to you by your health care provider. Make sure you discuss any questions you have with your health care provider. Document Revised: 09/03/2020 Document Reviewed: 09/03/2020 Elsevier Patient Education  2021 [employer].

## 2020-12-11 ENCOUNTER — Other Ambulatory Visit: Payer: 59

## 2020-12-11 ENCOUNTER — Telehealth: Payer: Self-pay

## 2020-12-11 NOTE — Telephone Encounter (Signed)
Left message

## 2020-12-11 NOTE — Telephone Encounter (Signed)
Now she is wanting to be out until the 12th. She was told her worknote was ready but she wants dates changed again

## 2020-12-11 NOTE — Telephone Encounter (Signed)
Patient called asking for an out of note work to be extended through the weekend.

## 2020-12-11 NOTE — Telephone Encounter (Signed)
Patient aware.

## 2020-12-11 NOTE — Telephone Encounter (Signed)
No additional change, I tried calling her could not speak with her From the medical info I have , there is no reason to extend the work note. If she feels strongly that she cannot work I recommend Urgent care evaluation

## 2020-12-13 ENCOUNTER — Ambulatory Visit
Admission: EM | Admit: 2020-12-13 | Discharge: 2020-12-13 | Disposition: A | Discharge status: Home / Self Care | Payer: 59 | Source: Ambulatory Visit | Attending: Family Medicine | Admitting: Family Medicine

## 2020-12-13 ENCOUNTER — Ambulatory Visit
Admission: RE | Admit: 2020-12-13 | Discharge: 2020-12-13 | Disposition: A | Discharge status: Home / Self Care | Payer: 59 | Source: Ambulatory Visit

## 2020-12-13 ENCOUNTER — Other Ambulatory Visit: Payer: Self-pay

## 2020-12-13 DIAGNOSIS — J069 Acute upper respiratory infection, unspecified: Secondary | ICD-10-CM

## 2020-12-13 DIAGNOSIS — R5383 Other fatigue: Secondary | ICD-10-CM | POA: Diagnosis not present

## 2020-12-13 DIAGNOSIS — R110 Nausea: Secondary | ICD-10-CM | POA: Diagnosis not present

## 2020-12-13 MED ORDER — ONDANSETRON HCL 4 MG PO TABS: 4.0000 mg | ORAL_TABLET | Freq: Four times a day (QID) | ORAL | 0 refills | Status: DC

## 2020-12-13 MED ORDER — PROMETHAZINE-DM 6.25-15 MG/5ML PO SYRP: 5.0000 mL | ORAL_SOLUTION | Freq: Four times a day (QID) | ORAL | 0 refills | Status: DC | PRN

## 2020-12-13 NOTE — ED Triage Notes (Signed)
Pt positive for covid 1/20 not feeling any better

## 2020-12-13 NOTE — ED Provider Notes (Signed)
[facility]   [id number] 12/13/20 Arrival Time: 4235   CC: COVID symptoms  SUBJECTIVE: History from: patient.  Stacey Palmer is a 40 y.o. female who presents with cough, fatigue, nausea, and headache. Has positive Covid on 01/227/22. Denies sick exposure to COVID, flu or strep. Denies recent travel. Has completed Covid vaccines. Has had Covid booster. Has not completed flu vaccine. Has not taken OTC medications for this. There are no aggravating or alleviating factors. Denies previous symptoms in the past. Denies fever, rhinorrhea, sore throat, SOB, wheezing, chest pain, nausea, changes in bowel or bladder habits.    ROS: As per HPI.  All other pertinent ROS negative.     Past Medical History:  Diagnosis Date  . Allergy   . Anxiety   . 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 Dry eyes 12/26/2018  . Obesity   . Sleep apnea    Past Surgical History:  Procedure Laterality Date  . BARIATRIC SURGERY  2018  . CESAREAN SECTION    . TUBAL LIGATION     No Known Allergies No current facility-administered medications on file prior to encounter.   Current Outpatient Medications on File Prior to Encounter  Medication Sig Dispense Refill  . albuterol (VENTOLIN HFA) 108 (90 Base) MCG/ACT inhaler Inhale 1-2 puffs into the lungs every 6 (six) hours as needed. 18 g 0  . benzonatate (TESSALON) 100 MG capsule Take 1 capsule (100 mg total) by mouth 2 (two) times daily as needed for cough. 20 capsule 0  . Chlorphen-PE-Acetaminophen (NOREL AD) 4-10-325 MG TABS Take 1 tablet by mouth every 4 (four) hours as needed (nasal congestion, cold symptoms). 20 tablet 1  . dicyclomine (BENTYL) 10 MG capsule Take one capsule by mouth two times daily as needed, for stomach cramps 20 capsule 0  . fluticasone (FLONASE) 50 MCG/ACT nasal spray Place 2 sprays into both nostrils daily. 16 g 0  .  HYDROcodone-homatropine (HYCODAN) 5-1.5 MG/5ML syrup Take 5 mLs by mouth every 8 (eight) hours as needed for cough. 120 mL 0  . levocetirizine (XYZAL) 5 MG tablet Take 1 tablet (5 mg total) by mouth every evening. 30 tablet 1  . pantoprazole (PROTONIX) 40 MG tablet Take 1 tablet (40 mg total) by mouth daily. 30 tablet 3  . phentermine (ADIPEX-P) 37.5 MG tablet Take one half tablet by mouth every morning with breakfast 30 tablet 1  . venlafaxine XR (EFFEXOR-XR) 75 MG 24 hr capsule Take 1 capsule (75 mg total) by mouth daily with breakfast. Dose increase effective 03/24/2019 30 capsule 3   Social History   Socioeconomic History  . Marital status: Single    Spouse name: Not on file  . Number of children: Not on file  . Years of education: Not on file  . Highest education level: Not on file  Occupational History  . Not on file  Tobacco Use  . Smoking status: Never Smoker  . Smokeless tobacco: Never Used  Vaping Use  . Vaping Use: Never used  Substance and Sexual Activity  . Alcohol use: No  . Drug use: No  . Sexual activity: Yes    Birth control/protection: Surgical    Comment: tubal  Other Topics Concern  . Not on file  Social History Narrative  . Not on file   Social Determinants of Health   Financial Resource Strain: Medium Risk  .  Difficulty of Paying Living Expenses: Somewhat hard  Food Insecurity: No Food Insecurity  . Worried About Programme researcher, broadcasting/film/video in the Last Year: Never true  . Ran Out of Food in the Last Year: Never true  Transportation Needs: No Transportation Needs  . Lack of Transportation (Medical): No  . Lack of Transportation (Non-Medical): No  Physical Activity: Sufficiently Active  . Days of Exercise per Week: 7 days  . Minutes of Exercise per Session: 30 min  Stress: Stress Concern Present  . Feeling of Stress : Rather much  Social Connections: Moderately Integrated  . Frequency of Communication with Friends and Family: Twice a week  . Frequency of  Social Gatherings with Friends and Family: Twice a week  . Attends Religious Services: More than 4 times per year  . Active Member of Clubs or Organizations: No  . Attends Banker Meetings: 1 to 4 times per year  . Marital Status: Never married  Intimate Partner Violence: Not At Risk  . Fear of Current or Ex-Partner: No  . Emotionally Abused: No  . Physically Abused: No  . Sexually Abused: No   Family History  Problem Relation Age of Onset  . Hypertension Mother   . Kidney disease Father   . Diabetes Father   . Breast cancer Paternal Aunt   . Dementia Paternal Grandmother   . Asthma Daughter     OBJECTIVE:  Vitals:   12/13/20 0851  BP: (!) 136/98  Pulse: 85  Resp: 16  Temp: 98.3 F (36.8 C)  TempSrc: Oral  SpO2: 98%     General appearance: alert; appears fatigued, but nontoxic; speaking in full sentences and tolerating own secretions HEENT: NCAT; Ears: EACs clear, TMs pearly gray; Eyes: PERRL.  EOM grossly intact. Sinuses: nontender; Nose: nares patent with clear rhinorrhea, Throat: oropharynx erythematous, cobblestoning present, tonsils non erythematous or enlarged, uvula midline  Neck: supple without LAD Lungs: unlabored respirations, symmetrical air entry; cough: moderate; no respiratory distress; CTAB Heart: regular rate and rhythm.  Radial pulses 2+ symmetrical bilaterally Skin: warm and dry Psychological: alert and cooperative; normal mood and affect  LABS:  No results found for this or any previous visit (from the past 24 hour(s)).   ASSESSMENT & PLAN:  1. Viral URI with cough   2. Nausea   3. Other fatigue     Meds ordered this encounter  Medications  . ondansetron (ZOFRAN) 4 MG tablet    Sig: Take 1 tablet (4 mg total) by mouth every 6 (six) hours.    Dispense:  12 tablet    Refill:  0    Order Specific Question:   Supervising Provider    Answer:   Merrilee Jansky [id number]  . promethazine-dextromethorphan (PROMETHAZINE-DM) 6.25-15  MG/5ML syrup    Sig: Take 5 mLs by mouth 4 (four) times daily as needed for cough.    Dispense:  118 mL    Refill:  0    Order Specific Question:   Supervising Provider    Answer:   Merrilee Jansky [[id number]]    Prescribed zofran Promethazine cough syrup prescribed Sedation precautions given Continue supportive care at home Work note provided Get plenty of rest and push fluids Use OTC zyrtec for nasal congestion, runny nose, and/or sore throat Use OTC flonase for nasal congestion and runny nose Use medications daily for symptom relief Use OTC medications like ibuprofen or tylenol as needed fever or pain Call or go to the ED if you have any  new or worsening symptoms such as fever, worsening cough, shortness of breath, chest tightness, chest pain, turning blue, changes in mental status.  Reviewed expectations re: course of current medical issues. Questions answered. Outlined signs and symptoms indicating need for more acute intervention. Patient verbalized understanding. After Visit Summary given.         Moshe Cipro, NP 12/13/20 1118

## 2020-12-13 NOTE — Discharge Instructions (Signed)
I have sent in cough syrup for you to take. This medication can make you sleepy. Do not drive while taking this medication.  I have sent in Zofran for you to take one tablet every 8 hours as needed for nausea.  Follow up with this office or with primary care if symptoms are persisting.  Follow up in the ER for high fever, trouble swallowing, trouble breathing, other concerning symptoms.

## 2020-12-14 ENCOUNTER — Encounter: Payer: Self-pay | Admitting: Family Medicine

## 2020-12-16 ENCOUNTER — Encounter: Payer: Self-pay | Admitting: Family Medicine

## 2020-12-16 DIAGNOSIS — R109 Unspecified abdominal pain: Secondary | ICD-10-CM | POA: Insufficient documentation

## 2020-12-16 HISTORY — DX: Unspecified abdominal pain: R10.9

## 2020-12-16 NOTE — Assessment & Plan Note (Signed)
Reports recent fever and new abdominal pain with loose stool, dicyclomine prescribed for abdominal cramps, work excuse extended to 2/10. Advised urgent care eval for persistent fever or worsening new or current symptoms

## 2020-12-16 NOTE — Assessment & Plan Note (Signed)
Symptomatic treatment with dicyclomine and Fulp diet advised

## 2020-12-17 ENCOUNTER — Ambulatory Visit
Admission: RE | Admit: 2020-12-17 | Discharge: 2020-12-17 | Disposition: A | Discharge status: Home / Self Care | Payer: 59 | Source: Ambulatory Visit | Attending: Family Medicine | Admitting: Family Medicine

## 2020-12-17 ENCOUNTER — Other Ambulatory Visit: Payer: Self-pay

## 2020-12-17 VITALS — BP 141/82 | HR 85 | Temp 98.3°F | Resp 20

## 2020-12-17 DIAGNOSIS — H65191 Other acute nonsuppurative otitis media, right ear: Secondary | ICD-10-CM

## 2020-12-17 DIAGNOSIS — U071 COVID-19: Secondary | ICD-10-CM

## 2020-12-17 MED ORDER — PREDNISONE 20 MG PO TABS: 40.0000 mg | ORAL_TABLET | Freq: Every day | ORAL | 0 refills | Status: DC

## 2020-12-17 NOTE — ED Triage Notes (Signed)
Pt covid positive x 2 weeks, still having weakness, body aches, fatigue.   Pain to RT ear, nose is burning

## 2020-12-17 NOTE — ED Provider Notes (Signed)
[facility]    CSN: [id number] Arrival date & time: 12/17/20  0854      History   Chief Complaint Chief Complaint  Patient presents with  . Covid Positive    HPI Stacey Palmer is a 40 y.o. female.   HPI  Patient presents today with multiple symptoms related to viral infection COVID-19.  Patient reports being diagnosed 2 weeks ago with a positive Covid testing continues to have some weakness, generalized body aches fatigue right ear pressure, nasal burning, intermittently sense of loss of smell along with chills.  She endorses subjective sensation of fever, but no measurable fever today.  She has been taking Tylenol, Zofran along with antihistamine for management of symptoms.  She was seen here in clinic on 12/13/2020 with similar symptoms but still have not improved.   Past Medical History:  Diagnosis Date  . Allergy   . Anxiety   . 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 Dry eyes 12/26/2018  . Obesity   . Sleep apnea     Patient Active Problem List   Diagnosis Date Noted  . Abdominal cramps 12/16/2020  . COVID-19 virus infection 11/30/2020  . 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 Vaginal irritation 09/18/2020  . Hip pain, right 09/16/2020  . Snoring 09/16/2020  . Labial skin tag 09/13/2020  . Fatigue 12/06/2019  . Allergies 12/06/2019  . Hair loss 12/06/2019  . GAD (generalized anxiety disorder) 12/26/2018  . Gastroesophageal reflux disease 06/04/2017  . Vitamin D deficiency 10/21/2016  . 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 Obesity (BMI 30-39.9) 06/11/2015  . 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 Skin tag 06/05/2014  . Allergic rhinitis 06/05/2014  . URI (upper respiratory infection) 08/23/2012  . Low back pain 04/04/2010    Past Surgical History:  Procedure Laterality Date  . BARIATRIC SURGERY  2018  . CESAREAN SECTION    . TUBAL  LIGATION      OB History    Gravida  3   Para  3   Term  3   Preterm      AB      Living  3     SAB      IAB      Ectopic      Multiple      Live Births  3            Home Medications    Prior to Admission medications   Medication Sig Start Date End Date Taking? Authorizing Provider  albuterol (VENTOLIN HFA) 108 (90 Base) MCG/ACT inhaler Inhale 1-2 puffs into the lungs every 6 (six) hours as needed. 12/02/20   Avegno, Zachery Dakins, FNP  benzonatate (TESSALON) 100 MG capsule Take 1 capsule (100 mg total) by mouth 2 (two) times daily as needed for cough. 11/26/20   Kerri Perches, MD  Chlorphen-PE-Acetaminophen (NOREL AD) 4-10-325 MG TABS Take 1 tablet by mouth every 4 (four) hours as needed (nasal congestion, cold symptoms). 11/21/20   Heather Roberts, NP  dicyclomine (BENTYL) 10 MG capsule Take one capsule by mouth two times daily as needed, for stomach cramps 12/10/20   Kerri Perches, MD  fluticasone [facility]) 50 MCG/ACT nasal spray Place 2 sprays into both nostrils daily. 07/11/16   Eustace Moore, MD  HYDROcodone-homatropine [facility]) 5-1.5 MG/5ML syrup Take 5 mLs by mouth every 8 (eight) hours  as needed for cough. 11/21/20   Heather Roberts, NP  levocetirizine (XYZAL) 5 MG tablet Take 1 tablet (5 mg total) by mouth every evening. 12/06/19   Freddy Finner, NP  ondansetron (ZOFRAN) 4 MG tablet Take 1 tablet (4 mg total) by mouth every 6 (six) hours. 12/13/20   Moshe Cipro, NP  pantoprazole (PROTONIX) 40 MG tablet Take 1 tablet (40 mg total) by mouth daily. 04/16/20   Kerri Perches, MD  phentermine (ADIPEX-P) 37.5 MG tablet Take one half tablet by mouth every morning with breakfast 09/13/20   Kerri Perches, MD  promethazine-dextromethorphan (PROMETHAZINE-DM) 6.25-15 MG/5ML syrup Take 5 mLs by mouth 4 (four) times daily as needed for cough. 12/13/20   Moshe Cipro, NP  venlafaxine XR (EFFEXOR-XR) 75 MG 24 hr capsule Take 1 capsule (75 mg  total) by mouth daily with breakfast. Dose increase effective 03/24/2019 09/16/19   Kerri Perches, MD    Family History Family History  Problem Relation Age of Onset  . Hypertension Mother   . Kidney disease Father   . Diabetes Father   . Breast cancer Paternal Aunt   . Dementia Paternal Grandmother   . Asthma Daughter     Social History Social History   Tobacco Use  . Smoking status: Never Smoker  . Smokeless tobacco: Never Used  Vaping Use  . Vaping Use: Never used  Substance Use Topics  . Alcohol use: No  . Drug use: No     Allergies   Patient has no known allergies.   Review of Systems Review of Systems Pertinent negatives listed in HPI  Physical Exam Triage Vital Signs ED Triage Vitals  Enc Vitals Group     BP 12/17/20 0859 (!) 141/82     Pulse Rate 12/17/20 0859 85     Resp 12/17/20 0859 20     Temp 12/17/20 0859 98.3 F (36.8 C)     Temp Source 12/17/20 0859 Oral     SpO2 12/17/20 0859 98 %     Weight --      Height --      Head Circumference --      Peak Flow --      Pain Score 12/17/20 0905 0     Pain Loc --      Pain Edu? --      Excl. in GC? --    No data found.  Updated Vital Signs BP (!) 141/82   Pulse 85   Temp 98.3 F (36.8 C) (Oral)   Resp 20   LMP 12/03/2020   SpO2 98%   Visual Acuity Right Eye Distance:   Left Eye Distance:   Bilateral Distance:    Right Eye Near:   Left Eye Near:    Bilateral Near:     Physical Exam General appearance: alert, acutely ill-appearing, cooperative and in no distress Head: Normocephalic, without obvious abnormality, atraumatic ENT: Right ear effusion present with tenderness during exam, left ear unremarkable, nares patent, oropharynx patent Respiratory: Respirations even and unlabored, normal respiratory rate Heart: Rate and rhythm normal. No gallop or murmurs noted on exam  Extremities: No gross deformities Skin: Skin color, texture, turgor normal. No rashes seen  Psych:  Appropriate flat affect Neurologic: GCS 15, normal coordination, normal gait no CN deficits  UC Treatments / Results  Labs (all labs ordered are listed, but only abnormal results are displayed) Labs Reviewed - No data to display  EKG   Radiology No results found.  Procedures Procedures (including critical care time)  Medications Ordered in UC Medications - No data to display  Initial Impression / Assessment and Plan / UC Course  I have reviewed the triage vital signs and the nursing notes.  Pertinent labs & imaging results that were available during my care of the patient were reviewed by me and considered in my medical decision making (see chart for details).    Patient presents today with ongoing course of COVID-19 symptoms.  New symptom presenting with today's right ear effusion and pressure we will treat with a short course of prednisone.  Other symptoms I recommended continue management with current regimen including ibuprofen for management of body aches after completion prednisone.  Continue antihistamine therapy.  Advised patient would not be able to complete [name] paperwork here in the urgent care setting however with write her out of work for the next 7 days as she requested which is 3 days any additional days or time off work outside of 3 days she will need to follow-up with primary care provider.  Patient verbalized understanding and agreement with plan Final Clinical Impressions(s) / UC Diagnoses   Final diagnoses:  COVID-19 virus infection  Acute effusion of right ear   Discharge Instructions   None    ED Prescriptions    Medication Sig Dispense Auth. Provider   predniSONE (DELTASONE) 20 MG tablet Take 2 tablets (40 mg total) by mouth daily with breakfast. 10 tablet Bing Neighbors, FNP     PDMP not reviewed this encounter.   Bing Neighbors, [place] 12/17/20 [phone]

## 2020-12-18 ENCOUNTER — Ambulatory Visit (INDEPENDENT_AMBULATORY_CARE_PROVIDER_SITE_OTHER): Payer: 59 | Admitting: Psychiatry

## 2020-12-18 DIAGNOSIS — F411 Generalized anxiety disorder: Secondary | ICD-10-CM

## 2020-12-18 DIAGNOSIS — F324 Major depressive disorder, single episode, in partial remission: Secondary | ICD-10-CM | POA: Diagnosis not present
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
## 2020-12-18 NOTE — Telephone Encounter (Signed)
complete

## 2020-12-19 ENCOUNTER — Ambulatory Visit (INDEPENDENT_AMBULATORY_CARE_PROVIDER_SITE_OTHER): Payer: 59 | Admitting: Nurse Practitioner

## 2020-12-19 ENCOUNTER — Encounter: Payer: Self-pay | Admitting: Nurse Practitioner

## 2020-12-19 ENCOUNTER — Ambulatory Visit (HOSPITAL_COMMUNITY)
Admission: RE | Admit: 2020-12-19 | Discharge: 2020-12-19 | Disposition: A | Discharge status: Home / Self Care | Payer: 59 | Source: Ambulatory Visit | Attending: Nurse Practitioner | Admitting: Nurse Practitioner

## 2020-12-19 ENCOUNTER — Other Ambulatory Visit: Payer: Self-pay

## 2020-12-19 VITALS — BP 114/77 | HR 76 | Temp 97.8°F | Resp 18 | Ht 67.0 in | Wt 250.0 lb

## 2020-12-19 DIAGNOSIS — U071 COVID-19: Secondary | ICD-10-CM

## 2020-12-19 NOTE — Patient Instructions (Signed)
Labs and chest x-ray today. We will meet back up in 2 weeks and see if symptoms have resolved.

## 2020-12-19 NOTE — Assessment & Plan Note (Signed)
-  she states her fatigue and body aches are preventing her from going back to work -will get labs today -CXR d/t cough -will consider referral to pulm and to ID if this persists over a month

## 2020-12-19 NOTE — Progress Notes (Signed)
Acute Office Visit  Subjective:    Patient ID: Stacey Palmer, female    DOB: May 06, 1981, 40 y.o.   MRN: [id number]  Chief Complaint  Patient presents with  . Fatigue    Pt had + covid jan 28th   . Cough  . Nasal Congestion    HPI Patient is in today for COVID follow-up. She tested positive around 12/03/19. She states she still has fatigue, coughing, sneezing, sore throat, hives, headaches, hand tingling and swelling near her eyes.  She states she is fully vaccinated. She would like a work note to stay out, but she has been out of work for a month. She states that weakness and fatigue is keeping her out of work.    Past Medical History:  Diagnosis Date  . Allergy   . Anxiety   . 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 Dry eyes 12/26/2018  . Obesity   . Sleep apnea     Past Surgical History:  Procedure Laterality Date  . BARIATRIC SURGERY  2018  . CESAREAN SECTION    . TUBAL LIGATION      Family History  Problem Relation Age of Onset  . Hypertension Mother   . Kidney disease Father   . Diabetes Father   . Breast cancer Paternal Aunt   . Dementia Paternal Grandmother   . Asthma Daughter     Social History   Socioeconomic History  . Marital status: Single    Spouse name: Not on file  . Number of children: Not on file  . Years of education: Not on file  . Highest education level: Not on file  Occupational History  . Not on file  Tobacco Use  . Smoking status: Never Smoker  . Smokeless tobacco: Never Used  Vaping Use  . Vaping Use: Never used  Substance and Sexual Activity  . Alcohol use: No  . Drug use: No  . Sexual activity: Yes    Birth control/protection: Surgical    Comment: tubal  Other Topics Concern  . Not on file  Social History Narrative  . Not on file   Social Determinants of Health   Financial Resource Strain: Medium Risk  .  Difficulty of Paying Living Expenses: Somewhat hard  Food Insecurity: No Food Insecurity  . Worried About Programme researcher, broadcasting/film/video in the Last Year: Never true  . Ran Out of Food in the Last Year: Never true  Transportation Needs: No Transportation Needs  . Lack of Transportation (Medical): No  . Lack of Transportation (Non-Medical): No  Physical Activity: Sufficiently Active  . Days of Exercise per Week: 7 days  . Minutes of Exercise per Session: 30 min  Stress: Stress Concern Present  . Feeling of Stress : Rather much  Social Connections: Moderately Integrated  . Frequency of Communication with Friends and Family: Twice a week  . Frequency of Social Gatherings with Friends and Family: Twice a week  . Attends Religious Services: More than 4 times per year  . Active Member of Clubs or Organizations: No  . Attends Banker Meetings: 1 to 4 times per year  . Marital Status: Never married  Intimate Partner Violence: Not At Risk  . Fear of Current or Ex-Partner: No  . Emotionally Abused: No  . Physically Abused: No  . Sexually Abused: No    Outpatient Medications Prior  to Visit  Medication Sig Dispense Refill  . albuterol (VENTOLIN HFA) 108 (90 Base) MCG/ACT inhaler Inhale 1-2 puffs into the lungs every 6 (six) hours as needed. 18 g 0  . benzonatate (TESSALON) 100 MG capsule Take 1 capsule (100 mg total) by mouth 2 (two) times daily as needed for cough. 20 capsule 0  . Chlorphen-PE-Acetaminophen (NOREL AD) 4-10-325 MG TABS Take 1 tablet by mouth every 4 (four) hours as needed (nasal congestion, cold symptoms). 20 tablet 1  . dicyclomine (BENTYL) 10 MG capsule Take one capsule by mouth two times daily as needed, for stomach cramps 20 capsule 0  . fluticasone (FLONASE) 50 MCG/ACT nasal spray Place 2 sprays into both nostrils daily. 16 g 0  . HYDROcodone-homatropine (HYCODAN) 5-1.5 MG/5ML syrup Take 5 mLs by mouth every 8 (eight) hours as needed for cough. 120 mL 0  . levocetirizine  (XYZAL) 5 MG tablet Take 1 tablet (5 mg total) by mouth every evening. 30 tablet 1  . ondansetron (ZOFRAN) 4 MG tablet Take 1 tablet (4 mg total) by mouth every 6 (six) hours. 12 tablet 0  . pantoprazole (PROTONIX) 40 MG tablet Take 1 tablet (40 mg total) by mouth daily. 30 tablet 3  . phentermine (ADIPEX-P) 37.5 MG tablet Take one half tablet by mouth every morning with breakfast 30 tablet 1  . predniSONE (DELTASONE) 20 MG tablet Take 2 tablets (40 mg total) by mouth daily with breakfast. 10 tablet 0  . promethazine-dextromethorphan (PROMETHAZINE-DM) 6.25-15 MG/5ML syrup Take 5 mLs by mouth 4 (four) times daily as needed for cough. 118 mL 0  . venlafaxine XR (EFFEXOR-XR) 75 MG 24 hr capsule Take 1 capsule (75 mg total) by mouth daily with breakfast. Dose increase effective 03/24/2019 30 capsule 3   No facility-administered medications prior to visit.    No Known Allergies  Review of Systems  Constitutional: Positive for fatigue. Negative for chills and fever.  HENT: Positive for congestion, sinus pressure and sneezing.   Respiratory: Positive for cough. Negative for chest tightness and wheezing.        SOB with exertion  Cardiovascular: Negative.   Musculoskeletal: Positive for myalgias.  Psychiatric/Behavioral: Negative.        Objective:    Physical Exam Constitutional:      Appearance: Normal appearance.  Cardiovascular:     Rate and Rhythm: Normal rate and regular rhythm.     Pulses: Normal pulses.     Heart sounds: Normal heart sounds.  Pulmonary:     Effort: Pulmonary effort is normal.     Breath sounds: Normal breath sounds.  Neurological:     Mental Status: She is alert.  Psychiatric:        Mood and Affect: Mood normal.        Behavior: Behavior normal.        Thought Content: Thought content normal.        Judgment: Judgment normal.     BP 114/77   Pulse 76   Temp 97.8 F (36.6 C)   Resp 18   Ht 5\' 7"  (1.702 m)   Wt 250 lb (113.4 kg)   LMP 12/03/2020    SpO2 98%   BMI 39.16 kg/m  Wt Readings from Last 3 Encounters:  12/19/20 250 lb (113.4 kg)  12/10/20 234 lb (106.1 kg)  12/03/20 241 lb (109.3 kg)    Health Maintenance Due  Topic Date Due  . Hepatitis C Screening  Never done    There are no  preventive care reminders to display for this patient.   Lab Results  Component Value Date   TSH 1.36 08/19/2018   Lab Results  Component Value Date   WBC 4.1 04/07/2020   HGB 13.3 04/07/2020   HCT 40.2 04/07/2020   MCV 95.5 04/07/2020   PLT 290 04/07/2020   Lab Results  Component Value Date   NA 138 04/07/2020   K 3.8 04/07/2020   CO2 24 04/07/2020   GLUCOSE 93 04/07/2020   BUN 10 04/07/2020   CREATININE 0.78 04/07/2020   BILITOT 0.8 04/07/2020   ALKPHOS 46 04/07/2020   AST 14 (L) 04/07/2020   ALT 11 04/07/2020   PROT 7.1 04/07/2020   ALBUMIN 3.5 04/07/2020   CALCIUM 9.1 04/07/2020   ANIONGAP 8 04/07/2020   Lab Results  Component Value Date   CHOL 180 08/19/2018   Lab Results  Component Value Date   HDL 83 08/19/2018   Lab Results  Component Value Date   LDLCALC 84 08/19/2018   Lab Results  Component Value Date   TRIG 53 08/19/2018   Lab Results  Component Value Date   CHOLHDL 2.2 08/19/2018   Lab Results  Component Value Date   HGBA1C 4.9 06/04/2017       Assessment & Plan:   Problem List Items Addressed This Visit      Other   COVID-19 virus infection    -she states her fatigue and body aches are preventing her from going back to work -will get labs today -CXR d/t cough -will consider referral to pulm and to ID if this persists over a month       Other Visit Diagnoses    COVID-19    -  Primary   Relevant Orders   DG Chest 2 View   CBC with Differential/Platelet   Basic Metabolic Panel (BMET)       No orders of the defined types were placed in this encounter.    Heather Roberts, NP

## 2020-12-20 LAB — BASIC METABOLIC PANEL WITH GFR
BUN/Creatinine Ratio: 14 (ref 9–23)
BUN: 10 mg/dL (ref 6–24)
CO2: 24 mmol/L (ref 20–29)
Calcium: 9.4 mg/dL (ref 8.7–10.2)
Chloride: 104 mmol/L (ref 96–106)
Creatinine, Ser: 0.73 mg/dL (ref 0.57–1.00)
GFR calc Af Amer: 118 mL/min/1.73 (ref >59)
GFR calc non Af Amer: 103 mL/min/1.73 (ref >59)
Glucose: 80 mg/dL (ref 65–99)
Potassium: 4.3 mmol/L (ref 3.5–5.2)
Sodium: 141 mmol/L (ref 134–144)

## 2020-12-20 LAB — CBC WITH DIFFERENTIAL/PLATELET
Basophils Absolute: 0 x10E3/uL (ref 0.0–0.2)
Basos: 1 %
EOS (ABSOLUTE): 0.1 x10E3/uL (ref 0.0–0.4)
Eos: 1 %
Hematocrit: 37.7 % (ref 34.0–46.6)
Hemoglobin: 12.6 g/dL (ref 11.1–15.9)
Immature Grans (Abs): 0 x10E3/uL (ref 0.0–0.1)
Immature Granulocytes: 0 %
Lymphocytes Absolute: 2 x10E3/uL (ref 0.7–3.1)
Lymphs: 48 %
MCH: 31.2 pg (ref 26.6–33.0)
MCHC: 33.4 g/dL (ref 31.5–35.7)
MCV: 93 fL (ref 79–97)
Monocytes Absolute: 0.4 x10E3/uL (ref 0.1–0.9)
Monocytes: 9 %
Neutrophils Absolute: 1.7 x10E3/uL (ref 1.4–7.0)
Neutrophils: 41 %
Platelets: 280 x10E3/uL (ref 150–450)
RBC: 4.04 x10E6/uL (ref 3.77–5.28)
RDW: 12.2 % (ref 11.7–15.4)
WBC: 4.2 x10E3/uL (ref 3.4–10.8)

## 2020-12-20 NOTE — Progress Notes (Signed)
Her labs look great.

## 2020-12-21 NOTE — Progress Notes (Signed)
CXR is negative 

## 2020-12-26 ENCOUNTER — Telehealth: Payer: Self-pay

## 2020-12-26 NOTE — Telephone Encounter (Signed)
[name] with [employer] --second forms   Copied Noted Sleeved

## 2020-12-27 ENCOUNTER — Other Ambulatory Visit: Payer: Self-pay | Admitting: Nurse Practitioner

## 2021-01-02 ENCOUNTER — Ambulatory Visit: Payer: 59 | Admitting: Nurse Practitioner

## 2021-01-03 DIAGNOSIS — U071 COVID-19: Secondary | ICD-10-CM

## 2021-01-04 NOTE — Telephone Encounter (Signed)
complete

## 2021-01-08 ENCOUNTER — Ambulatory Visit (INDEPENDENT_AMBULATORY_CARE_PROVIDER_SITE_OTHER): Payer: 59 | Admitting: Psychiatry

## 2021-01-08 ENCOUNTER — Other Ambulatory Visit: Payer: Self-pay

## 2021-01-08 DIAGNOSIS — F324 Major depressive disorder, single episode, in partial remission: Secondary | ICD-10-CM | POA: Diagnosis not present

## 2021-01-08 DIAGNOSIS — F411 Generalized anxiety disorder: Secondary | ICD-10-CM | POA: Diagnosis not present
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
## 2021-01-14 ENCOUNTER — Ambulatory Visit: Payer: 59 | Admitting: Family Medicine

## 2021-01-15 ENCOUNTER — Other Ambulatory Visit: Payer: Self-pay

## 2021-01-15 ENCOUNTER — Ambulatory Visit
Admission: RE | Admit: 2021-01-15 | Discharge: 2021-01-15 | Disposition: A | Discharge status: Home / Self Care | Payer: 59 | Source: Ambulatory Visit | Attending: Emergency Medicine | Admitting: Emergency Medicine

## 2021-01-15 VITALS — BP 127/84 | HR 75 | Temp 97.8°F | Resp 18

## 2021-01-15 DIAGNOSIS — J069 Acute upper respiratory infection, unspecified: Secondary | ICD-10-CM

## 2021-01-15 MED ORDER — ONDANSETRON HCL 4 MG PO TABS: 4.0000 mg | ORAL_TABLET | Freq: Four times a day (QID) | ORAL | 0 refills | Status: DC

## 2021-01-15 MED ORDER — BENZONATATE 100 MG PO CAPS: 100.0000 mg | ORAL_CAPSULE | Freq: Three times a day (TID) | ORAL | 0 refills | Status: DC

## 2021-01-15 MED ORDER — PREDNISONE 20 MG PO TABS: 20.0000 mg | ORAL_TABLET | Freq: Two times a day (BID) | ORAL | 0 refills | Status: AC

## 2021-01-15 NOTE — ED Triage Notes (Signed)
covid positive around the end of February.  Right ear pain and pain in legs and feet.  Feels tired.

## 2021-01-15 NOTE — Discharge Instructions (Signed)
Get plenty of rest and push fluids Tessalon Perles prescribed for cough Prednisone prescribed.  Take as directed and to completion Zofran for nausea Use OTC zyrtec for nasal congestion, runny nose, and/or sore throat Use OTC flonase for nasal congestion and runny nose Use medications daily for symptom relief Use OTC medications like ibuprofen or tylenol as needed fever or pain Follow up with PCP for further evaluation Call or go to the ED if you have any new or worsening symptoms such as fever, worsening cough, shortness of breath, chest tightness, chest pain, turning blue, changes in mental status, etc..Marland Kitchen

## 2021-01-15 NOTE — ED Provider Notes (Signed)
[facility]   [id number] 01/15/21 Arrival Time: 1735   CC: COVID symptoms  SUBJECTIVE: History from: patient.  Stacey Palmer is a 40 y.o. female who presents with right ear pain, nasal congestion, sinus pain/ pressure, cough, nausea, and fatigue x few days.  Diagnosed with COVID in February.  Denies sick exposure to COVID, flu or strep.  Denies alleviating or aggravating factors.  Denies previous symptoms in the past.   Denies fever, chills, SOB, wheezing, chest pain, changes in bowel or bladder habits.    ROS: As per HPI.  All other pertinent ROS negative.     Past Medical History:  Diagnosis Date  . Allergy   . Anxiety   . 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 Dry eyes 12/26/2018  . Obesity   . Sleep apnea    Past Surgical History:  Procedure Laterality Date  . BARIATRIC SURGERY  2018  . CESAREAN SECTION    . TUBAL LIGATION     No Known Allergies No current facility-administered medications on file prior to encounter.   Current Outpatient Medications on File Prior to Encounter  Medication Sig Dispense Refill  . albuterol (VENTOLIN HFA) 108 (90 Base) MCG/ACT inhaler Inhale 1-2 puffs into the lungs every 6 (six) hours as needed. 18 g 0  . Chlorphen-PE-Acetaminophen (NOREL AD) 4-10-325 MG TABS Take 1 tablet by mouth every 4 (four) hours as needed (nasal congestion, cold symptoms). 20 tablet 1  . dicyclomine (BENTYL) 10 MG capsule Take one capsule by mouth two times daily as needed, for stomach cramps 20 capsule 0  . fluticasone (FLONASE) 50 MCG/ACT nasal spray Place 2 sprays into both nostrils daily. 16 g 0  . HYDROcodone-homatropine (HYCODAN) 5-1.5 MG/5ML syrup Take 5 mLs by mouth every 8 (eight) hours as needed for cough. 120 mL 0  . levocetirizine (XYZAL) 5 MG tablet Take 1 tablet (5 mg total) by mouth every evening. 30 tablet 1  . pantoprazole (PROTONIX) 40 MG  tablet Take 1 tablet (40 mg total) by mouth daily. 30 tablet 3  . phentermine (ADIPEX-P) 37.5 MG tablet Take one half tablet by mouth every morning with breakfast 30 tablet 1  . promethazine-dextromethorphan (PROMETHAZINE-DM) 6.25-15 MG/5ML syrup Take 5 mLs by mouth 4 (four) times daily as needed for cough. 118 mL 0  . venlafaxine XR (EFFEXOR-XR) 75 MG 24 hr capsule Take 1 capsule (75 mg total) by mouth daily with breakfast. Dose increase effective 03/24/2019 30 capsule 3   Social History   Socioeconomic History  . Marital status: Single    Spouse name: Not on file  . Number of children: Not on file  . Years of education: Not on file  . Highest education level: Not on file  Occupational History  . Not on file  Tobacco Use  . Smoking status: Never Smoker  . Smokeless tobacco: Never Used  Vaping Use  . Vaping Use: Never used  Substance and Sexual Activity  . Alcohol use: No  . Drug use: No  . Sexual activity: Yes    Birth control/protection: Surgical    Comment: tubal  Other Topics Concern  . Not on file  Social History Narrative  . Not on file   Social Determinants of Health   Financial Resource Strain: Medium Risk  . Difficulty of Paying Living Expenses: Somewhat hard  Food Insecurity: No Food Insecurity  .  Worried About Programme researcher, broadcasting/film/video in the Last Year: Never true  . Ran Out of Food in the Last Year: Never true  Transportation Needs: No Transportation Needs  . Lack of Transportation (Medical): No  . Lack of Transportation (Non-Medical): No  Physical Activity: Sufficiently Active  . Days of Exercise per Week: 7 days  . Minutes of Exercise per Session: 30 min  Stress: Stress Concern Present  . Feeling of Stress : Rather much  Social Connections: Moderately Integrated  . Frequency of Communication with Friends and Family: Twice a week  . Frequency of Social Gatherings with Friends and Family: Twice a week  . Attends Religious Services: More than 4 times per year  .  Active Member of Clubs or Organizations: No  . Attends Banker Meetings: 1 to 4 times per year  . Marital Status: Never married  Intimate Partner Violence: Not At Risk  . Fear of Current or Ex-Partner: No  . Emotionally Abused: No  . Physically Abused: No  . Sexually Abused: No   Family History  Problem Relation Age of Onset  . Hypertension Mother   . Kidney disease Father   . Diabetes Father   . Breast cancer Paternal Aunt   . Dementia Paternal Grandmother   . Asthma Daughter     OBJECTIVE:  Vitals:   01/15/21 1749  BP: 127/84  Pulse: 75  Resp: 18  Temp: 97.8 F (36.6 C)  TempSrc: Oral  SpO2: 97%     General appearance: alert; appears fatigued, but nontoxic; speaking in full sentences and tolerating own secretions HEENT: NCAT; Ears: EACs clear, TMs pearly gray; Eyes: PERRL.  EOM grossly intact. Nose: nares patent without rhinorrhea, Throat: oropharynx clear, tonsils non erythematous or enlarged, uvula midline  Neck: supple without LAD Lungs: unlabored respirations, symmetrical air entry; cough: absent; no respiratory distress; CTAB Heart: regular rate and rhythm.   Skin: warm and dry Psychological: alert and cooperative; normal mood and affect   ASSESSMENT & PLAN:  1. Viral URI with cough     Meds ordered this encounter  Medications  . ondansetron (ZOFRAN) 4 MG tablet    Sig: Take 1 tablet (4 mg total) by mouth every 6 (six) hours.    Dispense:  12 tablet    Refill:  0    Order Specific Question:   Supervising Provider    Answer:   Eustace Moore [[id number]]  . benzonatate (TESSALON) 100 MG capsule    Sig: Take 1 capsule (100 mg total) by mouth every 8 (eight) hours.    Dispense:  21 capsule    Refill:  0    Order Specific Question:   Supervising Provider    Answer:   Eustace Moore [[id number]]  . predniSONE (DELTASONE) 20 MG tablet    Sig: Take 1 tablet (20 mg total) by mouth 2 (two) times daily with a meal for 5 days.    Dispense:  10  tablet    Refill:  0    Order Specific Question:   Supervising Provider    Answer:   Eustace Moore [[id number]]    Get plenty of rest and push fluids Tessalon Perles prescribed for cough Prednisone prescribed.  Take as directed and to completion Zofran for nausea Use OTC zyrtec for nasal congestion, runny nose, and/or sore throat Use OTC flonase for nasal congestion and runny nose Use medications daily for symptom relief Use OTC medications like ibuprofen or tylenol as needed fever or pain  Follow up with PCP for further evaluation Call or go to the ED if you have any new or worsening symptoms such as fever, worsening cough, shortness of breath, chest tightness, chest pain, turning blue, changes in mental status, etc...   Reviewed expectations re: course of current medical issues. Questions answered. Outlined signs and symptoms indicating need for more acute intervention. Patient verbalized understanding. After Visit Summary given.         Rennis Harding, PA-C 01/15/21 1825

## 2021-01-16 ENCOUNTER — Ambulatory Visit: Payer: 59 | Admitting: Family

## 2021-01-16 ENCOUNTER — Ambulatory Visit: Payer: Self-pay

## 2021-01-23 ENCOUNTER — Other Ambulatory Visit: Payer: Self-pay

## 2021-01-23 ENCOUNTER — Ambulatory Visit (INDEPENDENT_AMBULATORY_CARE_PROVIDER_SITE_OTHER): Payer: 59 | Admitting: Psychiatry

## 2021-01-23 DIAGNOSIS — F324 Major depressive disorder, single episode, in partial remission: Secondary | ICD-10-CM | POA: Diagnosis not present

## 2021-01-23 DIAGNOSIS — F411 Generalized anxiety disorder: Secondary | ICD-10-CM
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
## 2021-01-24 ENCOUNTER — Ambulatory Visit: Payer: 59 | Admitting: Family

## 2021-01-26 ENCOUNTER — Encounter: Payer: Self-pay | Admitting: Emergency Medicine

## 2021-01-26 ENCOUNTER — Ambulatory Visit: Payer: Self-pay

## 2021-01-26 ENCOUNTER — Other Ambulatory Visit: Payer: Self-pay

## 2021-01-26 ENCOUNTER — Ambulatory Visit
Admission: EM | Admit: 2021-01-26 | Discharge: 2021-01-26 | Disposition: A | Discharge status: Home / Self Care | Payer: 59 | Attending: Physician Assistant | Admitting: Physician Assistant

## 2021-01-26 DIAGNOSIS — M25579 Pain in unspecified ankle and joints of unspecified foot: Secondary | ICD-10-CM | POA: Diagnosis not present

## 2021-01-26 MED ORDER — NAPROXEN 500 MG PO TABS: 500.0000 mg | ORAL_TABLET | Freq: Two times a day (BID) | ORAL | 0 refills | Status: DC

## 2021-01-26 NOTE — Discharge Instructions (Signed)
Etiology is unclear, could be early arthritis, vs an inflammatory arthritis or mechanical. Start with your PCP for a work up to determine where to be referred. Use the NSAIDs for pain. FU here as needed

## 2021-01-26 NOTE — ED Triage Notes (Addendum)
Swelling and burning sensation to bilateral ankles and feet for 2 weeks.  Pt state she wont be able to work today because she has to stand up a lot on her job.

## 2021-01-26 NOTE — ED Provider Notes (Signed)
[facility]    CSN: [id number] Arrival date & time: 01/26/21  1157      History   Chief Complaint Chief Complaint  Patient presents with  . Leg Swelling    HPI Stacey Palmer is a 40 y.o. female.   Presents with bilateral ankle pain. She stands all day at work and this makes them worse. They "feel swollen" without redness or warmth. Pain with standing and walking, improved with rest. No fevers or rashes. She has had prior strains in the past. No recent injury.      Past Medical History:  Diagnosis Date  . Allergy   . Anxiety   . 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 Dry eyes 12/26/2018  . H/O gastric sleeve 2018  . History of mammogram 2021  . Obesity   . Sleep apnea     Patient Active Problem List   Diagnosis Date Noted  . Abdominal cramps 12/16/2020  . COVID-19 virus infection 11/30/2020  . 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 Vaginal irritation 09/18/2020  . Hip pain, right 09/16/2020  . Snoring 09/16/2020  . Labial skin tag 09/13/2020  . Fatigue 12/06/2019  . Allergies 12/06/2019  . Hair loss 12/06/2019  . GAD (generalized anxiety disorder) 12/26/2018  . Gastroesophageal reflux disease 06/04/2017  . Vitamin D deficiency 10/21/2016  . 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 Obesity (BMI 30-39.9) 06/11/2015  . 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 Skin tag 06/05/2014  . Allergic rhinitis 06/05/2014  . URI (upper respiratory infection) 08/23/2012  . Low back pain 04/04/2010    Past Surgical History:  Procedure Laterality Date  . BARIATRIC SURGERY  2018  . CESAREAN SECTION    . TUBAL LIGATION      OB History    Gravida  3   Para  3   Term  3   Preterm      AB      Living  3     SAB      IAB      Ectopic      Multiple      Live Births  3            Home Medications    Prior to Admission medications    Medication Sig Start Date End Date Taking? Authorizing Provider  albuterol (VENTOLIN HFA) 108 (90 Base) MCG/ACT inhaler Inhale 1-2 puffs into the lungs every 6 (six) hours as needed. 12/02/20   Avegno, Zachery Dakins, FNP  benzonatate (TESSALON) 100 MG capsule Take 1 capsule (100 mg total) by mouth every 8 (eight) hours. 01/15/21   Wurst, [place], PA-C  Chlorphen-PE-Acetaminophen (NOREL AD) 4-10-325 MG TABS Take 1 tablet by mouth every 4 (four) hours as needed (nasal congestion, cold symptoms). 11/21/20   Heather Roberts, NP  Cholecalciferol (VITAMIN D3 PO) Take 1 capsule by mouth once a week.    [provider]  dicyclomine (BENTYL) 10 MG capsule Take one capsule by mouth two times daily as needed, for stomach cramps 12/10/20   Kerri Perches, MD  Ferrous Sulfate (IRON PO) Take 1 capsule by mouth daily.    [provider]  fluticasone (FLONASE) 50 MCG/ACT nasal spray Place 2 sprays into both nostrils daily. 07/11/16   Eustace Moore, MD  HYDROcodone-homatropine [facility]) 5-1.5 MG/5ML syrup Take 5 mLs by mouth every 8 (eight) hours  as needed for cough. 11/21/20   Heather Roberts, NP  levocetirizine (XYZAL) 5 MG tablet Take 1 tablet (5 mg total) by mouth every evening. 12/06/19   Freddy Finner, NP  ondansetron (ZOFRAN) 4 MG tablet Take 1 tablet (4 mg total) by mouth every 6 (six) hours. 01/15/21   Wurst, [place], PA-C  pantoprazole (PROTONIX) 40 MG tablet Take 1 tablet (40 mg total) by mouth daily. 04/16/20   Kerri Perches, MD  phentermine (ADIPEX-P) 37.5 MG tablet Take one half tablet by mouth every morning with breakfast 09/13/20   Kerri Perches, MD  promethazine-dextromethorphan (PROMETHAZINE-DM) 6.25-15 MG/5ML syrup Take 5 mLs by mouth 4 (four) times daily as needed for cough. 12/13/20   Moshe Cipro, NP  venlafaxine XR (EFFEXOR-XR) 75 MG 24 hr capsule Take 1 capsule (75 mg total) by mouth daily with breakfast. Dose increase effective 03/24/2019 09/16/19   Kerri Perches, MD    Family History Family History  Problem Relation Age of Onset  . Hypertension Mother   . Kidney disease Father   . Diabetes Father   . Breast cancer Paternal Aunt   . Dementia Paternal Grandmother   . Asthma Daughter     Social History Social History   Tobacco Use  . Smoking status: Never Smoker  . Smokeless tobacco: Never Used  Vaping Use  . Vaping Use: Never used  Substance Use Topics  . Alcohol use: No  . Drug use: No     Allergies   Patient has no known allergies.   Review of Systems Review of Systems  Constitutional: Negative for fatigue and fever.  Respiratory: Negative for shortness of breath.   Musculoskeletal: Positive for arthralgias and joint swelling.  Skin: Negative.      Physical Exam Triage Vital Signs ED Triage Vitals  Enc Vitals Group     BP 01/26/21 1208 123/80     Pulse Rate 01/26/21 1208 73     Resp 01/26/21 1208 19     Temp 01/26/21 1208 98.1 F (36.7 C)     Temp Source 01/26/21 1208 Oral     SpO2 01/26/21 1208 96 %     Weight --      Height --      Head Circumference --      Peak Flow --      Pain Score 01/26/21 1206 8     Pain Loc --      Pain Edu? --      Excl. in GC? --    No data found.  Updated Vital Signs BP 123/80 (BP Location: Right Arm)   Pulse 73   Temp 98.1 F (36.7 C) (Oral)   Resp 19   LMP 01/09/2021   SpO2 96%   Visual Acuity Right Eye Distance:   Left Eye Distance:   Bilateral Distance:    Right Eye Near:   Left Eye Near:    Bilateral Near:     Physical Exam Nursing note reviewed.  Constitutional:      General: She is not in acute distress.    Appearance: Normal appearance. She is normal weight. She is not ill-appearing.  HENT:     Head: Normocephalic and atraumatic.  Cardiovascular:     Comments: No pitting edema in the BLE Musculoskeletal:        General: No swelling, tenderness, deformity or signs of injury. Normal range of motion.     Comments: Ankles with questionable  mild swelling, no erythema or  warmth is noted. No effusions. Full ROM without restriction.   Skin:    General: Skin is warm and dry.     Findings: No rash.  Neurological:     Mental Status: She is alert.  Psychiatric:        Mood and Affect: Mood normal.      UC Treatments / Results  Labs (all labs ordered are listed, but only abnormal results are displayed) Labs Reviewed - No data to display  EKG   Radiology No results found.  Procedures Procedures (including critical care time)  Medications Ordered in UC Medications - No data to display  Initial Impression / Assessment and Plan / UC Course  I have reviewed the triage vital signs and the nursing notes.  Pertinent labs & imaging results that were available during my care of the patient were reviewed by me and considered in my medical decision making (see chart for details).     Etiology unclear. No frank evidence of inflammatory arthritis. Question related to old injuries and current job position vs. Systemic. Will provide NSAID to help with discomfort and she will f/u with her PCP for further evaluation and work up.  Final Clinical Impressions(s) / UC Diagnoses   Final diagnoses:  None   Discharge Instructions   None    ED Prescriptions    None     PDMP not reviewed this encounter.   Riki Sheer, [place] 01/26/21 1417

## 2021-01-30 ENCOUNTER — Encounter: Payer: Self-pay | Admitting: Podiatry

## 2021-01-30 ENCOUNTER — Other Ambulatory Visit: Payer: Self-pay | Admitting: Podiatry

## 2021-01-30 ENCOUNTER — Ambulatory Visit (INDEPENDENT_AMBULATORY_CARE_PROVIDER_SITE_OTHER): Payer: 59

## 2021-01-30 ENCOUNTER — Ambulatory Visit (INDEPENDENT_AMBULATORY_CARE_PROVIDER_SITE_OTHER): Payer: 59 | Admitting: Podiatry

## 2021-01-30 ENCOUNTER — Encounter: Payer: Self-pay | Admitting: *Deleted

## 2021-01-30 ENCOUNTER — Other Ambulatory Visit: Payer: Self-pay

## 2021-01-30 DIAGNOSIS — M76821 Posterior tibial tendinitis, right leg: Secondary | ICD-10-CM

## 2021-01-30 DIAGNOSIS — M2141 Flat foot [pes planus] (acquired), right foot: Secondary | ICD-10-CM

## 2021-01-30 DIAGNOSIS — M2142 Flat foot [pes planus] (acquired), left foot: Secondary | ICD-10-CM

## 2021-01-30 DIAGNOSIS — M722 Plantar fascial fibromatosis: Secondary | ICD-10-CM

## 2021-01-30 DIAGNOSIS — M76822 Posterior tibial tendinitis, left leg: Secondary | ICD-10-CM

## 2021-01-30 DIAGNOSIS — M2012 Hallux valgus (acquired), left foot: Secondary | ICD-10-CM

## 2021-01-30 DIAGNOSIS — M21611 Bunion of right foot: Secondary | ICD-10-CM

## 2021-01-30 DIAGNOSIS — M21612 Bunion of left foot: Secondary | ICD-10-CM

## 2021-01-30 DIAGNOSIS — M2011 Hallux valgus (acquired), right foot: Secondary | ICD-10-CM

## 2021-01-30 MED ORDER — MELOXICAM 15 MG PO TABS: 15.0000 mg | ORAL_TABLET | Freq: Every day | ORAL | 3 refills | Status: DC

## 2021-01-30 NOTE — Progress Notes (Signed)
  Subjective:  Patient ID: Stacey Palmer, female    DOB: 1981-09-21,  MRN: [id number]  Chief Complaint  Patient presents with  . Foot Pain    Patient presents today for bilat feet pain x 3-4 weeks.  She says they hurt all over, but left is much worse than right and the left foot hurts on top of midfoot.  She has sharp pains that come and go.  She says she fell yesterday and made the left foot pain worse    40 y.o. female presents with the above complaint. History confirmed with patient.  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  Says the feet have been turning her more more.  Mostly along the top and inside of the arch.  Objective:  Physical Exam: warm, good capillary refill, no trophic changes or ulcerative lesions, normal DP and PT pulses and normal sensory exam. Mild diffuse pain along the tibialis anterior and posterior tibial tendons in the medial arch bilaterally, she has pes planus and mild hallux valgus deformity bilaterally  Radiographs: X-ray of both feet: no fracture, dislocation, swelling or degenerative changes noted, hallux valgus deformity and pes planus Assessment:   1. Plantar fasciitis   2. Pes planus of both feet   3. Posterior tibial tendon dysfunction (PTTD) of both lower extremities   4. Hallux valgus with bunions, right   5. Hallux valgus with bunions, left      Plan:  Patient was evaluated and treated and all questions answered.  Discussed the etiology, pathomechanics and treatment options in detail with the patient regarding her adult acquired flatfoot and posterior tibial tendon dysfunction.  We also reviewed today's radiographs in detail.  We discussed how pes planus deformity without pain or functional limitation is quite common in children and often does not require any treatment.  However when pain or functional limitation arises, treatment with nonsurgical therapy is our first line with stretching, physical therapy, and supportive orthoses.   Recommended orthotic support with power steps today.  She should begin stretching and rehab exercises for PT tendinitis and I wrote her prescription for meloxicam and reviewed its use and possible side effects.  Do not think she needs CAM boot immobilization.  Consider MRI or PT at next visit if not improving.   Return in about 6 weeks (around 03/13/2021) for re-check flat foot pain and tendinitis.

## 2021-01-30 NOTE — Patient Instructions (Addendum)
Posterior Tibial Tendinitis  Posterior tibial tendinitis is irritation of a tendon called the posterior tibial tendon. Your posterior tibial tendon is a cord-like tissue that connects bones of your lower leg and foot to a muscle that: 1. Supports your arch. 2. Helps you raise up on your toes. 3. Helps you turn your foot down and in. This condition causes foot and ankle pain. It can also lead to a flat foot. What are the causes? This condition is most often caused by repeated stress to the tendon (overuse injury). It can also be caused by a sudden injury that stresses the tendon, such as landing on your foot after jumping or falling. What increases the risk? This condition is more likely to develop in: 1. People who play a sport that involves putting a lot of pressure on the feet, such as: 1. Basketball. 2. Tennis. 3. Soccer. 4. Hockey. 2. Runners. 3. Females who are older than 42 years of age and are overweight. 4. People with diabetes. 5. People with decreased foot stability. 6. People with flat feet. What are the signs or symptoms? Symptoms include: 1. Pain in the inner ankle. 2. Pain at the arch of your foot. 3. Pain that gets worse with running, walking, or standing. 4. Swelling on the inside of your ankle and foot. 5. Weakness in your ankle or foot. 6. Inability to stand up on tiptoe. 7. Flattening of the arch of your foot. How is this diagnosed? This condition may be diagnosed based on: 1. Your symptoms. 2. Your medical history. 3. A physical exam. 4. Tests, such as: 1. X-ray. 2. MRI. 3. Ultrasound. How is this treated? This condition may be treated by: 1. Putting ice to the injured area. 2. Taking NSAIDs, such as ibuprofen, to reduce pain and swelling. 3. Wearing a special shoe or shoe insert to support your arch (orthotic). 4. Having physical therapy. 5. Replacing high-impact exercise with low-impact exercise, such as swimming or cycling. If your symptoms  do not improve with these treatments, you may need to wear a splint, removable walking boot, or short leg cast for 6-8 weeks to keep your foot and ankle still (immobilized). Follow these instructions at home: If you have a cast, splint, or boot:  Keep it clean and dry.  Check the skin around it every day. Tell your health care provider about any concerns. If you have a cast:  Do not stick anything inside it to scratch your skin. Doing that increases your risk of infection.  You may put lotion on dry skin around the edges of the cast. Do not put lotion on the skin underneath the cast. If you have a splint or boot:  Wear it as told by your health care provider. Remove it only as told by your health care provider.  Loosen it if your toes tingle, become numb, or turn cold and blue. Bathing 1. Do not take baths, swim, or use a hot tub until your health care provider approves. Ask your health care provider if you may take showers. 2. If your cast, splint, or boot is not waterproof: ? Do not let it get wet. ? Cover it with a waterproof covering while you take a bath or a shower. Managing pain and swelling   1. If directed, put ice on the injured area. ? If you have a removable splint or boot, remove it as told by your health care provider. ? Put ice in a plastic bag. ? Place a  towel between your skin and the bag or between your cast and the bag. ? Leave the ice on for 20 minutes, 2-3 times a day. 2. Move your toes often to reduce stiffness and swelling. 3. Raise (elevate) the injured area above the level of your heart while you are sitting or lying down. Activity  Do not use the injured foot to support your body weight until your health care provider says that you can. Use crutches as told by your health care provider.  Do not do activities that make pain or swelling worse.  Ask your health care provider when it is safe to drive if you have a cast, splint, or boot on your  foot.  Return to your normal activities as told by your health care provider. Ask your health care provider what activities are safe for you.  Do exercises as told by your health care provider. General instructions  Take over-the-counter and prescription medicines only as told by your health care provider.  If you have an orthotic, use it as told by your health care provider.  Keep all follow-up visits as told by your health care provider. This is important. How is this prevented?  Wear footwear that is appropriate to your athletic activity.  Avoid athletic activities that cause pain or swelling in your ankle or foot.  Before being active, do range-of-motion and stretching exercises.  If you develop pain or swelling while training, stop training.  If you have pain or swelling that does not improve after a few days of rest, see your health care provider.  If you start a new athletic activity, start gradually so you can build up your strength and flexibility. Contact a health care provider if:  Your symptoms get worse.  Your symptoms do not improve in 6-8 weeks.  You develop new, unexplained symptoms.  Your splint, boot, or cast gets damaged. Summary  Posterior tibial tendinitis is irritation of a tendon called the posterior tibial tendon.  This condition is most often caused by repeated stress to the tendon (overuse injury).  This condition causes foot pain and ankle pain. It can also lead to a flat foot.  This condition may be treated by not doing high-impact activities, applying ice, having physical therapy, wearing orthotics, and wearing a cast, splint, or boot if needed. This information is not intended to replace advice given to you by your health care provider. Make sure you discuss any questions you have with your health care provider. Document Revised: 02/15/2019 Document Reviewed: 12/23/2018 [employer] Patient Education  2020 [employer]  Posterior Tibial  Tendinitis Rehab Ask your health care provider which exercises are safe for you. Do exercises exactly as told by your health care provider and adjust them as directed. It is normal to feel mild stretching, pulling, tightness, or discomfort as you do these exercises. Stop right away if you feel sudden pain or your pain gets worse. Do not begin these exercises until told by your health care provider. Stretching and range-of-motion exercises These exercises warm up your muscles and joints and improve the movement and flexibility in your ankle and foot. These exercises may also help to relieve pain. Standing wall calf stretch, knee straight   4. Stand with your hands against a wall. 5. Extend your left / right leg behind you, and bend your front knee slightly. If directed, place a folded washcloth under the arch of your foot for support. 6. Point the toes of your back foot slightly inward. 7.  Keeping your heels on the floor and your back knee straight, shift your weight toward the wall. Do not allow your back to arch. You should feel a gentle stretch in your upper left / right calf. 8. Hold this position for 10 seconds. Repeat 10 times. Complete this exercise 2 times a day. Standing wall calf stretch, knee bent 7. Stand with your hands against a wall. 8. Extend your left / right leg behind you, and bend your front knee slightly. If directed, place a folded washcloth under the arch of your foot for support. 9. Point the toes of your back foot slightly inward. 10. Unlock your back knee so it is bent. Keep your heels on the floor. You should feel a gentle stretch deep in your lower left / right calf. 11. Hold this position for 10 seconds. Repeat 10 times. Complete this exercise 2 times a day. Strengthening exercises These exercises build strength and endurance in your ankle and foot. Endurance is the ability to use your muscles for a long time, even after they get tired. Ankle inversion with  band 8. Secure one end of a rubber exercise band or tubing to a fixed object, such as a table leg or a pole, that will stay still when the band is pulled. 9. Loop the other end of the band around the middle of your left / right foot. 10. Sit on the floor facing the object with your left / right leg extended. The band or tube should be slightly tense when your foot is relaxed. 11. Leading with your big toe, slowly bring your left / right foot and ankle inward, toward your other foot (inversion). 12. Hold this position for 10 seconds. 13. Slowly return your foot to the starting position. Repeat 10 times. Complete this exercise 2 times a day. Towel curls   5. Sit in a chair on a non-carpeted surface, and put your feet on the floor. 6. Place a towel in front of your feet. 7. Keeping your heel on the floor, put your left / right foot on the towel. 8. Pull the towel toward you by grabbing the towel with your toes and curling them under. Keep your heel on the floor while you do this. 9. Let your toes relax. 10. Grab the towel with your toes again. Keep going until the towel is completely underneath your foot. Repeat 10 times. Complete this exercise 2 times a day. Balance exercise This exercise improves or maintains your balance. Balance is important in preventing falls. Single leg stand 6. Without wearing shoes, stand near a railing or in a doorway. You may hold on to the railing or door frame as needed for balance. 7. Stand on your left / right foot. Keep your big toe down on the floor and try to keep your arch lifted. ? If balancing in this position is too easy, try the exercise with your eyes closed or while standing on a pillow. 8. Hold this position for 10 seconds. Repeat 10 times. Complete this exercise 2 times a day. This information is not intended to replace advice given to you by your health care provider. Make sure you discuss any questions you have with your health care  provider.  Flat Feet, Adult  Flat feet is a common condition in which there is no curve, or arch, on the inner sides of the feet. Normally, an adult foot has an arch. The arch creates a gap between the foot and the ground. This condition can  occur in one foot or in both feet. What are the causes? This condition may be caused by:  An injury to tendons and ligaments in the foot, such as to the tendon that supports the arch (posterior tibial tendon). Tendons are tissues that connect muscle to bone, and ligaments are tissues that connect bones to each other.  Loose tendons or ligaments in your foot.  A wearing down of your arch over time.  Injury to bones in your foot.  An abnormality in the bones of your foot called tarsal coalition. This happens when two or more bones in the foot are joined together (fused). Tarsal coalition is often present at birth, but signs of it typically do not show until the early teen years. What increases the risk? The following factors may make you more likely to develop this condition:  Being an adult age 36 or older.  Having a family history of flat feet or a history of childhood flexible flatfoot.  Having obesity, diabetes, or high blood pressure.  Taking part in high-impact sports.  Having inflammatory arthritis.  Having had any of these problems with the bones in your foot: ? A broken bone (fracture). ? Bones that were moved out of place (dislocated). ? Achilles tendon injuries. What are the signs or symptoms? Symptoms of this condition include:  Pain or tightness along the bottom of your foot.  Foot pain that gets worse with activity.  Swelling of the inner side of your foot or swelling of your ankle.  Pain on the outer side of your ankle.  Changes in gait. Your gait is the way that you walk.  Pronation. This is when the foot and ankle lean inward when you are standing.  Bony bumps on the top or inner side of your foot. How is this  diagnosed? This condition is diagnosed with a physical exam of your foot and ankle. Your health care provider may also:  Check your shoes for patterns of wear on the soles.  Order imaging tests, such as X-rays to see the bone structure.  Refer you to a health care provider who specializes in feet (podiatrist). How is this treated? This condition may be treated with:  Rest and ice to decrease inflammation and pain in the affected foot.  Stretching or physical therapy exercises. These are done to: ? Improve movement and strength in your foot. ? Increase range of motion and relieve pain.  A shoe insert (orthotic insert) for one foot or both feet. This helps to support the arch of your foot. An orthotic insert or inserts can be purchased from a store or can be custom-made.  Wearing shoes with appropriate arch support. This is especially important for athletes.  Medicines. These may be prescribed or injected into the affected foot to relieve pain.  An ankle boot or cast or a foot or leg brace to relieve pressure on your affected foot.  Surgery. This may be done to improve the alignment of your foot. Surgery is needed only if your posterior tibial tendon is torn or if you have tarsal coalition.   Follow these instructions at home: Managing pain, stiffness, and swelling  If directed, put ice on the painful area. To do this: ? If you have a removable brace or boot, remove it as told by your health care provider. ? Put ice in a plastic bag. ? Place a towel between your skin and the bag or between your cast and the bag. ? Leave the ice on  for 20 minutes, 2-3 times a day. ? Remove the ice if your skin turns bright red. This is very important. If you cannot feel pain, heat, or cold, you have a greater risk of damage to the area.  Move your toes often to reduce stiffness and swelling.  Raise (elevate) the injured area above the level of your heart while you are sitting or lying  down. Activity  Do exercises as told by your health care provider, if they were prescribed.  If an activity causes pain, avoid it or try to find another activity that does not cause pain. General instructions  Wear an orthotic insert and appropriate shoes as told by your health care provider.  Take over-the-counter and prescription medicines only as told by your health care provider.  Wear a brace, boot, or cast as told by your health care provider.  Keep all follow-up visits. This is important. How is this prevented? To prevent the condition from getting worse:  Wear comfortable, supportive shoes that are appropriate for your activities.  Maintain a healthy weight.  Stay active in a way that your health care provider recommends. This will help to keep your feet flexible and strong.  Manage long-term (chronic) health conditions, such as diabetes, high blood pressure, and inflammatory arthritis.  Work with a health care provider if you have concerns about your feet or shoes. Contact a health care provider if you have:  Pain in your foot or lower leg that gets worse or does not improve with medicine.  Pain or trouble when walking.  Problems with your orthotic insert. Summary  Flat feet is a common condition in which there is no curve, or arch, on the inner sides of the feet. This condition can occur in one foot or in both feet.  Your health care provider may recommend a shoe insert (orthotic insert) for one foot or both feet, or shoes with the appropriate arch support.  Other treatments may include stretching or physical therapy exercises, pain medicines, and wearing a foot or leg brace or an ankle boot or cast.  Surgery may be done if you have a tear in the tendon that supports your arch (posterior tibial tendon) or if two or more of your foot bones were joined together, or fused,before birth (tarsal coalition). This information is not intended to replace advice given to you  by your health care provider. Make sure you discuss any questions you have with your health care provider. Document Revised: 03/29/2020 Document Reviewed: 03/29/2020 [employer] Patient Education  2021 [employer]

## 2021-01-31 ENCOUNTER — Encounter: Payer: Self-pay | Admitting: Family

## 2021-01-31 ENCOUNTER — Ambulatory Visit (INDEPENDENT_AMBULATORY_CARE_PROVIDER_SITE_OTHER): Payer: 59 | Admitting: Family

## 2021-01-31 VITALS — BP 120/80 | HR 72 | Temp 97.1°F | Resp 18 | Ht 67.0 in | Wt 257.6 lb

## 2021-01-31 DIAGNOSIS — Z0000 Encounter for general adult medical examination without abnormal findings: Secondary | ICD-10-CM

## 2021-01-31 DIAGNOSIS — M79672 Pain in left foot: Secondary | ICD-10-CM

## 2021-01-31 DIAGNOSIS — R399 Unspecified symptoms and signs involving the genitourinary system: Secondary | ICD-10-CM

## 2021-01-31 DIAGNOSIS — F411 Generalized anxiety disorder: Secondary | ICD-10-CM

## 2021-01-31 DIAGNOSIS — Y92009 Unspecified place in unspecified non-institutional (private) residence as the place of occurrence of the external cause: Secondary | ICD-10-CM

## 2021-01-31 DIAGNOSIS — K219 Gastro-esophageal reflux disease without esophagitis: Secondary | ICD-10-CM

## 2021-01-31 DIAGNOSIS — W19XXXD Unspecified fall, subsequent encounter: Secondary | ICD-10-CM

## 2021-01-31 DIAGNOSIS — E559 Vitamin D deficiency, unspecified: Secondary | ICD-10-CM

## 2021-01-31 DIAGNOSIS — Z1159 Encounter for screening for other viral diseases: Secondary | ICD-10-CM

## 2021-01-31 DIAGNOSIS — J309 Allergic rhinitis, unspecified: Secondary | ICD-10-CM

## 2021-01-31 DIAGNOSIS — Z6841 Body Mass Index (BMI) 40.0 and over, adult: Secondary | ICD-10-CM

## 2021-01-31 LAB — POCT URINALYSIS DIPSTICK
Blood, UA: NEGATIVE
Glucose, UA: NEGATIVE
Nitrite, UA: NEGATIVE
Protein, UA: POSITIVE — AB
Spec Grav, UA: >=1.03 — AB (ref 1.010–1.025)
Urobilinogen, UA: 1 E.U./dL
pH, UA: 6 (ref 5.0–8.0)

## 2021-01-31 NOTE — Progress Notes (Addendum)
Provider: Marlowe Sax FNP-C   , Nelda Bucks, NP  Patient Care Team: , Nelda Bucks, NP as PCP - General (Family Medicine) Phillips Odor, MD as Consulting Physician (Neurology)  Extended Emergency Contact Information Primary Emergency Contact: Mastel,Gaynelle Address: [place] [address] [place] Phone: [phone] Mobile Phone: [phone] Relation: Mother Secondary Emergency Contact: [place], [place] [address] [place] Phone: [phone] Mobile Phone: [phone] Relation: Friend  Code Status: Full Code  Goals of care: Advanced Directive information Advanced Directives 01/31/2021  Does Patient Have a Medical Advance Directive? No  Would patient like information on creating a medical advance directive? No - Patient declined     Chief Complaint  Patient presents with  . Establish Care    New Patient.  . Fall Risk    Moderate Fall Risk.    HPI:  Pt is a 40 y.o. female seen today to Establish care at [facility] for medical management of chronic diseases.Has a medical history of Anemia,GERD,Generalized anxiety disorder,Depression,vitamin D deficiency seasonal allergies among others.states still recovering from COVID-19 infection she had in January february 2022.  She states fell yesterday after she accidentally turned and stepped on one of the child's toy's fell and hit her side of face on the table.Also had pain on left ankle and foot.she was seen in [facility] in [place].Urgent care note on chart reviewed was seen 01/26/2021 after she presented with bilateral ankle pain  prolong standing at work which  And walking makes it worst.Pain improves with rest.Has hx of prior strain.Her ankles had questionable mild swelling  Without any erythema,warm or effusion noted.Also had full ROM.Naproxen 500 mg twice daily with meals was prescribed and advised to follow up with PCP.  She was seen  yesterday 01/30/2021 by Podiatrist Dr.McDonald Adam at [place] [facility] for bilateral foot pain for 3-4 weeks left worst than the right.pain described as intermittent sharp pain.she was noted to have bilateral pes planus and mild hallux valgus deformity.X-ray of both feet showed no fracture ,dislocation,swelling or degenerative changes noted.Halluz valgus deformity and pes planus noted.Orthotic support with power steps was provided.stretching and rehab exercises for PT tendinitis recommended.Mobic was prescribed for pain.No CAM boot immobilization needed.MRI or PT if no improvement. She was advised to follow up in 6 weeks to recheck foot pain   Burning with urination and increased urination with lower back pain.denies any fever,chills,abdmonial pain,nausea or vomiting.   GERD - Has sour taste especially with late evening meal when she lies down.takes Protonix 40 mg tablet daily.denies any blood in stool.takes ferrous sulfate.feels fatigue whenever she does not take ferrous sulfate.   Generalized Anxiety/Depression - states depression has improved but gets anxious sometimes.Takes Venlafaxine XR 75 mg 24 hr capsule as needed though supposed to take on a daily basis. I've discussed with her to take medication as directed.she sees a Social worker.Has been having virtual visit with counselor as needed.    Seasonal Allergies - on levocetirizine 5 mg tablet and uses Flonase daily.does not think Flonase is helping.    Vitamin D deficiency - Vit D levels have been low in the past now on vit D supplement.Had recent fall but no fracture.   Obesity - was on Phentermine 37.7 mg tablet daily but did not like how it made her feel so no longer taking it. Wonders whether there's any other medication she  can take.has not tried dietary modification and exercise.Her diet includes veggies,Pizza,eggs,Turkey,bacon,potatoes and cereal for breakfast. Her daughter also gives milkshakes and eats out sometimes but tries to  cook at home.     Past Medical History:  Diagnosis Date  . Allergy   . Anxiety   . Depression, major, single episode, in partial remission ([place]) 12/26/2018   pHQ 9 SCORE OF 12 IN 12/2018, score of 0 in 03/2019 on medication  . Dry eyes 12/26/2018  . H/O gastric sleeve 2018  . History of mammogram 2021  . Obesity   . Sleep apnea    Past Surgical History:  Procedure Laterality Date  . [place] SURGERY  2018  . CESAREAN SECTION    . TUBAL LIGATION      No Known Allergies  Allergies as of 01/31/2021   No Known Allergies     Medication List       Accurate as of January 31, 2021  1:50 PM. If you have any questions, ask your nurse or doctor.        STOP taking these medications   albuterol 108 (90 Base) MCG/ACT inhaler Commonly known as: VENTOLIN HFA Stopped by: Nelda Bucks , NP   dicyclomine 10 MG capsule Commonly known as: Bentyl Stopped by: Sandrea Hughs, NP   phentermine 37.5 MG tablet Commonly known as: ADIPEX-P Stopped by: Sandrea Hughs, NP     TAKE these medications   fluticasone 50 MCG/ACT nasal spray Commonly known as: FLONASE Place 2 sprays into both nostrils daily.   IRON PO Take 1 capsule by mouth daily.   levocetirizine 5 MG tablet Commonly known as: XYZAL Take 1 tablet (5 mg total) by mouth every evening.   meloxicam 15 MG tablet Commonly known as: Mobic Take 1 tablet (15 mg total) by mouth daily.   pantoprazole 40 MG tablet Commonly known as: PROTONIX Take 1 tablet (40 mg total) by mouth daily.   prednisoLONE acetate 1 % ophthalmic suspension Commonly known as: PRED FORTE SMARTSIG:In Eye(s)   venlafaxine XR 75 MG 24 hr capsule Commonly known as: EFFEXOR-XR Take 1 capsule (75 mg total) by mouth daily with breakfast. Dose increase effective 03/24/2019   VITAMIN D3 PO Take 1 capsule by mouth once a week.       Review of Systems  Constitutional: Negative for appetite change, chills, fatigue and fever.  HENT: Negative for  congestion, dental problem, hearing loss, mouth sores, rhinorrhea, sinus pressure, sinus pain, sneezing, sore throat and trouble swallowing.   Eyes: Negative for pain, discharge, redness and itching.       Dry eyes has eye drops bt does not like it " It Burns"   Respiratory: Negative for cough, chest tightness, shortness of breath and wheezing.   Cardiovascular: Negative for chest pain, palpitations and leg swelling.  Gastrointestinal: Negative for abdominal distention, abdominal pain, constipation, diarrhea, nausea and vomiting.  Endocrine: Negative for cold intolerance, heat intolerance, polydipsia, polyphagia and polyuria.  Genitourinary: Positive for dysuria and frequency. Negative for difficulty urinating, flank pain, hematuria, urgency, vaginal bleeding and vaginal discharge.       LMP around March 15 th -17 Th   Musculoskeletal: Positive for arthralgias. Negative for gait problem, joint swelling and myalgias.       Left foot pain post fall.bilateral foot pain seen by Podiatrist 01/30/2021  Skin: Negative for color change, pallor and rash.  Neurological: Negative for dizziness, speech difficulty, weakness, light-headedness, numbness and headaches.  Hematological: Does not bruise/bleed easily.  Psychiatric/Behavioral:  Negative for agitation, behavioral problems, confusion and sleep disturbance. The patient is nervous/anxious.     Immunization History  Administered Date(s) Administered  . Influenza Split 10/06/2011, 09/01/2012, 07/23/2014  . Influenza Whole 08/17/2007, 09/13/2009  . Influenza,inj,Quad PF,6+ Mos 08/24/2013, 08/06/2016, 07/22/2017, 08/19/2018, 08/08/2019, 07/27/2020  . Moderna Sars-Covid-2 Vaccination 12/09/2019, 01/06/2020, 08/27/2020  . PPD Test 06/02/2014  . Td 03/05/2005  . Tdap 09/20/2010, 10/06/2011   Pertinent  Health Maintenance Due  Topic Date Due  . PAP SMEAR-Modifier  08/19/2021  . INFLUENZA VACCINE  Completed   Fall Risk  01/31/2021 12/19/2020 12/10/2020  12/03/2020 11/19/2020  Falls in the past year? 1 0 0 0 0  Number falls in past yr: 0 0 0 0 0  Injury with Fall? 1 0 0 0 0  Risk for fall due to : - No Fall Risks No Fall Risks - No Fall Risks  Follow up - Falls evaluation completed Falls evaluation completed - Falls evaluation completed   Functional Status Survey:    Vitals:   01/31/21 1303  BP: 120/80  Pulse: 72  Resp: 18  Temp: (!) 97.1 F (36.2 C)  SpO2: 98%  Weight: 257 lb 9.6 oz (116.8 kg)  Height: _0  (1.702 m)   Body mass index is 40.35 kg/m. Physical Exam Vitals reviewed.  Constitutional:      General: She is not in acute distress.    Appearance: She is morbidly obese. She is not ill-appearing.  HENT:     Head: Normocephalic.     Right Ear: Tympanic membrane, ear canal and external ear normal. There is no impacted cerumen.     Left Ear: Tympanic membrane, ear canal and external ear normal. There is no impacted cerumen.     Nose: Nose normal. No rhinorrhea.     Right Turbinates: Not enlarged, swollen or pale.     Left Turbinates: Enlarged. Not swollen or pale.     Right Sinus: No maxillary sinus tenderness or frontal sinus tenderness.     Left Sinus: No maxillary sinus tenderness or frontal sinus tenderness.     Mouth/Throat:     Mouth: Mucous membranes are moist.     Pharynx: Oropharynx is clear. No oropharyngeal exudate or posterior oropharyngeal erythema.  Eyes:     General: No scleral icterus.       Right eye: No discharge.        Left eye: No discharge.     Extraocular Movements: Extraocular movements intact.     Conjunctiva/sclera: Conjunctivae normal.     Pupils: Pupils are equal, round, and reactive to light.  Neck:     Vascular: No carotid bruit.  Cardiovascular:     Rate and Rhythm: Normal rate and regular rhythm.     Pulses: Normal pulses.     Heart sounds: Normal heart sounds. No murmur heard. No friction rub. No gallop.   Pulmonary:     Effort: Pulmonary effort is normal. No respiratory  distress.     Breath sounds: Normal breath sounds. No wheezing, rhonchi or rales.  Chest:     Chest wall: No tenderness.  Abdominal:     General: Bowel sounds are normal. There is no distension.     Palpations: Abdomen is soft. There is no mass.     Tenderness: There is no abdominal tenderness. There is no right CVA tenderness, left CVA tenderness, guarding or rebound.  Musculoskeletal:        General: No swelling or tenderness. Normal range of motion.  Cervical back: Normal range of motion. No rigidity or tenderness.     Right lower leg: No edema.     Left lower leg: No edema.  Lymphadenopathy:     Cervical: No cervical adenopathy.  Skin:    General: Skin is warm and dry.     Coloration: Skin is not pale.     Findings: No bruising, erythema or rash.  Neurological:     Mental Status: She is alert and oriented to person, place, and time.     Cranial Nerves: No cranial nerve deficit.     Sensory: No sensory deficit.     Motor: No weakness.     Coordination: Coordination normal.     Gait: Gait normal.  Psychiatric:        Mood and Affect: Mood normal.        Speech: Speech normal.        Behavior: Behavior normal.        Thought Content: Thought content normal.        Judgment: Judgment normal.     Labs reviewed: Recent Labs    04/07/20 0703 12/19/20 0919  NA 138 141  K 3.8 4.3  CL 106 104  CO2 24 24  GLUCOSE 93 80  BUN 10 10  CREATININE 0.78 0.73  CALCIUM 9.1 9.4   Recent Labs    04/07/20 0703  AST 14*  ALT 11  ALKPHOS 46  BILITOT 0.8  PROT 7.1  ALBUMIN 3.5   Recent Labs    04/07/20 0703 12/19/20 0919  WBC 4.1 4.2  NEUTROABS  --  1.7  HGB 13.3 12.6  HCT 40.2 37.7  MCV 95.5 93  PLT 290 280   Lab Results  Component Value Date   TSH 1.36 08/19/2018   Lab Results  Component Value Date   HGBA1C 4.9 06/04/2017   Lab Results  Component Value Date   CHOL 180 08/19/2018   HDL 83 08/19/2018   LDLCALC 84 08/19/2018   TRIG 53 08/19/2018    CHOLHDL 2.2 08/19/2018    Significant Diagnostic Results in last 30 days:  DG Foot Complete Left  Result Date: 01/30/2021 Please see detailed radiograph report in office note.  DG Foot Complete Right  Result Date: 01/30/2021 Please see detailed radiograph report in office note.   Assessment/Plan 1. Encounter for medical examination to establish care Immunization are up to date including COVID-19 vaccine.   Medication and labs reviewed patient counselled regarding yearly exam, prevention of dental and periodontal disease, diet, regular sustained exercise for at least 30 minutes x 3 /week,COVID-19 hand hygiene, mask and social distancing per CDC guidelines.recommended schedule for routine labs. - CBC with Differential/Platelet; Future - CMP with eGFR([facility]); Future - TSH; Future - Lipid panel; Future  2. Gastroesophageal reflux disease, unspecified whether esophagitis present Previous H/H reviewed on chart stable. - continue on Protonix 40 mg tablet daily. - Advised to avoid late evening meals and lying down after eating.  - CBC with Differential/Platelet; Future  3. GAD (generalized anxiety disorder) Stable. Continue on Venlafaxine XR  - continue follow up with counseling service   4. Symptoms of urinary tract infection Afebrile.  Reports urine frequency and dysuria. - advised to take OTC cranberry tablet one by mouth twice daily - POC Urinalysis Dipstick urine cloudy with large ketones,trace leukocytes,ketones,protein but no blood or nitrites.results discussed with patient will send for culture then will call with results in 3 days.  - Urine Culture  5. Vitamin D deficiency  Latest vit D level was 28 ( 08/19/2018). Continue on weekly vitamin D supplement will recheck level  - Vitamin D, 1,25-dihydroxy; Future  6. Allergic rhinitis, unspecified seasonality, unspecified trigger Continue on levocetirizine 5 mg tablet and uses Flonase daily   7. Left foot pain Continue on  mobic and PT exercises as directed by Podiatrist.continue to wear Ortho shoes for support.   8. Morbid obesity ([place]) Dietary modification and exercise at least 3 times per week for 30 minutes. Additional [place] eating information provided on AVS  - she will follow up in 4 months if still no weight changes will consider referral to Nutritionist or weight management clinic.  - encouraged to cut down on sweets/juices/sodas and drink water 6 - 8 glasses daily. She seems motivated to make dietary changes and exercise.  - CMP with eGFR([facility]); Future - TSH; Future  9. BMI 40.0-44.9, adult (HCC) BMI 40.35  Dietary modification and exercise discussed at length as above.then will refer to Nutritionist or weight management clinic if no improvement.  - CMP with eGFR([facility]); Future - TSH; Future  10. Fall at home, subsequent encounter Accidentally stepped on her Child's toy when turning.X-ray was negative for fracture.continues to complain of soreness and swelling though no visible swelling,erythema or effusion noted.completed Full range of motion without any difficulties. - advised to apply ice to painful areas of ankle and foot as needed. - continue Mobic or OTC tylenol for pain  - CBC with Differential/Platelet; Future - CMP with eGFR([facility]); Future  Family/ staff Communication: Reviewed plan of care with patient verbalized understanding.  Labs/tests ordered:  - CBC with Differential/Platelet; Future - CMP with eGFR([facility]); Future - TSH; Future - Lipid panel; Future - Vitamin D, 1,25-dihydroxy; Future - POC Urinalysis Dipstick - Urine Culture  Next Appointment : 4 months for weight evaluation.Fasting labs tomorrow or in 1 week.   Time spent with patient 60 minutes >50% time spent counseling; reviewing medical record; tests; labs; and developing future plan of care   Sandrea Hughs, NP

## 2021-01-31 NOTE — Patient Instructions (Addendum)
- Increase water intake to 6-8 glasses of water daily   - Low carbohydrates,low saturated fat,and high vegetable diet.Include low fat nuts in diet like almonds,sunflower seeds,pumpkin seeds,chia seeds etc in your diet. Cut down on meat or bacon,sausages,pork.Eat lean meat like fish or [place].   - Exercise at least 3 times per week for 30 minutes  https://www.mata.com/.pdf">  [place] Eating Plan Kirt stands for Dietary Approaches to Stop Hypertension. The [place] eating plan is a healthy eating plan that has been shown to:  Reduce high blood pressure (hypertension).  Reduce your risk for type 2 diabetes, heart disease, and stroke.  Help with weight loss. What are tips for following this plan? Reading food labels  Check food labels for the amount of salt (sodium) per serving. Choose foods with less than 5 percent of the Daily Value of sodium. Generally, foods with less than 300 milligrams (mg) of sodium per serving fit into this eating plan.  To find whole grains, look for the word "whole" as the first word in the ingredient list. Shopping  Buy products labeled as "low-sodium" or "no salt added."  Buy fresh foods. Avoid canned foods and pre-made or frozen meals. Cooking  Avoid adding salt when cooking. Use salt-free seasonings or herbs instead of table salt or sea salt. Check with your health care provider or pharmacist before using salt substitutes.  Do not fry foods. Cook foods using healthy methods such as baking, boiling, grilling, roasting, and broiling instead.  Cook with heart-healthy oils, such as olive, canola, avocado, soybean, or sunflower oil. Meal planning  Eat a balanced diet that includes: ? 4 or more servings of fruits and 4 or more servings of vegetables each day. Try to fill one-half of your plate with fruits and vegetables. ? 6-8 servings of whole grains each day. ? Less than 6 oz (170 g) of lean meat, poultry, or fish each day.  A 3-oz (85-g) serving of meat is about the same size as a deck of cards. One egg equals 1 oz (28 g). ? 2-3 servings of low-fat dairy each day. One serving is 1 cup (237 mL). ? 1 serving of nuts, seeds, or beans 5 times each week. ? 2-3 servings of heart-healthy fats. Healthy fats called omega-3 fatty acids are found in foods such as walnuts, flaxseeds, fortified milks, and eggs. These fats are also found in cold-water fish, such as sardines, salmon, and mackerel.  Limit how much you eat of: ? Canned or prepackaged foods. ? Food that is high in trans fat, such as some fried foods. ? Food that is high in saturated fat, such as fatty meat. ? Desserts and other sweets, sugary drinks, and other foods with added sugar. ? Full-fat dairy products.  Do not salt foods before eating.  Do not eat more than 4 egg yolks a week.  Try to eat at least 2 vegetarian meals a week.  Eat more home-cooked food and less restaurant, buffet, and fast food.   Lifestyle  When eating at a restaurant, ask that your food be prepared with less salt or no salt, if possible.  If you drink alcohol: ? Limit how much you use to:  0-1 drink a day for women who are not pregnant.  0-2 drinks a day for men. ? Be aware of how much alcohol is in your drink. In the [place] one drink equals one 12 oz bottle of beer (355 mL), one 5 oz glass of wine (148 mL), or one 1 oz  glass of hard liquor (44 mL). General information  Avoid eating more than 2,300 mg of salt a day. If you have hypertension, you may need to reduce your sodium intake to 1,500 mg a day.  Work with your health care provider to maintain a healthy body weight or to lose weight. Ask what an ideal weight is for you.  Get at least 30 minutes of exercise that causes your heart to beat faster (aerobic exercise) most days of the week. Activities may include walking, swimming, or biking.  Work with your health care provider or dietitian to adjust your eating plan to  your individual calorie needs. What foods should I eat? Fruits All fresh, dried, or frozen fruit. Canned fruit in natural juice (without added sugar). Vegetables Fresh or frozen vegetables (raw, steamed, roasted, or grilled). Low-sodium or reduced-sodium tomato and vegetable juice. Low-sodium or reduced-sodium tomato sauce and tomato paste. Low-sodium or reduced-sodium canned vegetables. Grains Whole-grain or whole-wheat bread. Whole-grain or whole-wheat pasta. Brown rice. Orpah Cobb. Bulgur. Whole-grain and low-sodium cereals. Pita bread. Low-fat, low-sodium crackers. Whole-wheat flour tortillas. Meats and other proteins Skinless chicken or [place]. Ground chicken or [place]. Pork with fat trimmed off. Fish and seafood. Egg whites. Dried beans, peas, or lentils. Unsalted nuts, nut butters, and seeds. Unsalted canned beans. Lean cuts of beef with fat trimmed off. Low-sodium, lean precooked or cured meat, such as sausages or meat loaves. Dairy Low-fat (1%) or fat-free (skim) milk. Reduced-fat, low-fat, or fat-free cheeses. Nonfat, low-sodium ricotta or cottage cheese. Low-fat or nonfat yogurt. Low-fat, low-sodium cheese. Fats and oils Soft margarine without trans fats. Vegetable oil. Reduced-fat, low-fat, or light mayonnaise and salad dressings (reduced-sodium). Canola, safflower, olive, avocado, soybean, and sunflower oils. Avocado. Seasonings and condiments Herbs. Spices. Seasoning mixes without salt. Other foods Unsalted popcorn and pretzels. Fat-free sweets. The items listed above may not be a complete list of foods and beverages you can eat. Contact a dietitian for more information. What foods should I avoid? Fruits Canned fruit in a light or heavy syrup. Fried fruit. Fruit in cream or butter sauce. Vegetables Creamed or fried vegetables. Vegetables in a cheese sauce. Regular canned vegetables (not low-sodium or reduced-sodium). Regular canned tomato sauce and paste (not low-sodium or  reduced-sodium). Regular tomato and vegetable juice (not low-sodium or reduced-sodium). Rosita Fire. Olives. Grains Baked goods made with fat, such as croissants, muffins, or some breads. Dry pasta or rice meal packs. Meats and other proteins Fatty cuts of meat. Ribs. Fried meat. Tomasa Blase. Bologna, salami, and other precooked or cured meats, such as sausages or meat loaves. Fat from the back of a pig (fatback). Bratwurst. Salted nuts and seeds. Canned beans with added salt. Canned or smoked fish. Whole eggs or egg yolks. Chicken or [place] with skin. Dairy Whole or 2% milk, cream, and half-and-half. Whole or full-fat cream cheese. Whole-fat or sweetened yogurt. Full-fat cheese. Nondairy creamers. Whipped toppings. Processed cheese and cheese spreads. Fats and oils Butter. Stick margarine. Lard. Shortening. Ghee. Bacon fat. Tropical oils, such as coconut, palm kernel, or palm oil. Seasonings and condiments Onion salt, garlic salt, seasoned salt, table salt, and sea salt. Worcestershire sauce. Tartar sauce. Barbecue sauce. Teriyaki sauce. Soy sauce, including reduced-sodium. Steak sauce. Canned and packaged gravies. Fish sauce. Oyster sauce. Cocktail sauce. Store-bought horseradish. Ketchup. Mustard. Meat flavorings and tenderizers. Bouillon cubes. Hot sauces. Pre-made or packaged marinades. Pre-made or packaged taco seasonings. Relishes. Regular salad dressings. Other foods Salted popcorn and pretzels. The items listed above may not be a complete  list of foods and beverages you should avoid. Contact a dietitian for more information. Where to find more information  National Heart, Lung, and Blood Institute: PopSteam.is  American Heart Association: www.heart.org  Academy of Nutrition and Dietetics: www.eatright.org  National Kidney Foundation: www.kidney.org Summary  The [place] eating plan is a healthy eating plan that has been shown to reduce high blood pressure (hypertension). It may also reduce  your risk for type 2 diabetes, heart disease, and stroke.  When on the [place] eating plan, aim to eat more fresh fruits and vegetables, whole grains, lean proteins, low-fat dairy, and heart-healthy fats.  With the [place] eating plan, you should limit salt (sodium) intake to 2,300 mg a day. If you have hypertension, you may need to reduce your sodium intake to 1,500 mg a day.  Work with your health care provider or dietitian to adjust your eating plan to your individual calorie needs. This information is not intended to replace advice given to you by your health care provider. Make sure you discuss any questions you have with your health care provider. Document Revised: 09/23/2019 Document Reviewed: 09/23/2019 Elsevier Patient Education  2021 [employer].

## 2021-02-01 ENCOUNTER — Ambulatory Visit: Payer: 59 | Admitting: Podiatry

## 2021-02-02 LAB — URINE CULTURE
MICRO NUMBER:: 11720744
SPECIMEN QUALITY:: ADEQUATE

## 2021-02-03 ENCOUNTER — Encounter: Payer: Self-pay | Admitting: Podiatry

## 2021-02-03 DIAGNOSIS — M76821 Posterior tibial tendinitis, right leg: Secondary | ICD-10-CM

## 2021-02-03 DIAGNOSIS — M722 Plantar fascial fibromatosis: Secondary | ICD-10-CM

## 2021-02-04 ENCOUNTER — Encounter: Payer: Self-pay | Admitting: Podiatry

## 2021-02-06 ENCOUNTER — Ambulatory Visit (HOSPITAL_COMMUNITY): Payer: 59 | Admitting: Psychiatry

## 2021-02-06 ENCOUNTER — Other Ambulatory Visit: Payer: 59

## 2021-02-06 ENCOUNTER — Other Ambulatory Visit: Payer: Self-pay

## 2021-02-06 DIAGNOSIS — Y92009 Unspecified place in unspecified non-institutional (private) residence as the place of occurrence of the external cause: Secondary | ICD-10-CM

## 2021-02-06 DIAGNOSIS — Z1159 Encounter for screening for other viral diseases: Secondary | ICD-10-CM

## 2021-02-06 DIAGNOSIS — K219 Gastro-esophageal reflux disease without esophagitis: Secondary | ICD-10-CM

## 2021-02-06 DIAGNOSIS — Z6841 Body Mass Index (BMI) 40.0 and over, adult: Secondary | ICD-10-CM

## 2021-02-06 DIAGNOSIS — Z0000 Encounter for general adult medical examination without abnormal findings: Secondary | ICD-10-CM

## 2021-02-06 DIAGNOSIS — R399 Unspecified symptoms and signs involving the genitourinary system: Secondary | ICD-10-CM

## 2021-02-06 DIAGNOSIS — W19XXXD Unspecified fall, subsequent encounter: Secondary | ICD-10-CM

## 2021-02-06 DIAGNOSIS — E559 Vitamin D deficiency, unspecified: Secondary | ICD-10-CM

## 2021-02-07 ENCOUNTER — Ambulatory Visit: Payer: 59 | Admitting: Orthopaedic Surgery

## 2021-02-07 ENCOUNTER — Other Ambulatory Visit: Payer: 59

## 2021-02-07 ENCOUNTER — Encounter: Payer: Self-pay | Admitting: Podiatry

## 2021-02-07 ENCOUNTER — Other Ambulatory Visit: Payer: Self-pay

## 2021-02-08 ENCOUNTER — Other Ambulatory Visit: Payer: Self-pay

## 2021-02-10 LAB — COMPLETE METABOLIC PANEL WITH GFR
AG Ratio: 1.2 (calc) (ref 1.0–2.5)
ALT: 9 U/L (ref 6–29)
AST: 12 U/L (ref 10–30)
Albumin: 3.7 g/dL (ref 3.6–5.1)
Alkaline phosphatase (APISO): 54 U/L (ref 31–125)
BUN: 13 mg/dL (ref 7–25)
CO2: 28 mmol/L (ref 20–32)
Calcium: 9.1 mg/dL (ref 8.6–10.2)
Chloride: 102 mmol/L (ref 98–110)
Creat: 0.74 mg/dL (ref 0.50–1.10)
GFR, Est African American: 117 mL/min/{1.73_m2} (ref >=60)
GFR, Est Non African American: 101 mL/min/{1.73_m2} (ref >=60)
Globulin: 3.1 g/dL (calc) (ref 1.9–3.7)
Glucose, Bld: 81 mg/dL (ref 65–99)
Potassium: 4.2 mmol/L (ref 3.5–5.3)
Sodium: 138 mmol/L (ref 135–146)
Total Bilirubin: 0.6 mg/dL (ref 0.2–1.2)
Total Protein: 6.8 g/dL (ref 6.1–8.1)

## 2021-02-10 LAB — CBC WITH DIFFERENTIAL/PLATELET
Absolute Monocytes: 327 cells/uL (ref 200–950)
Basophils Absolute: 19 cells/uL (ref 0–200)
Basophils Relative: 0.5 %
Eosinophils Absolute: 80 cells/uL (ref 15–500)
Eosinophils Relative: 2.1 %
HCT: 38.6 % (ref 35.0–45.0)
Hemoglobin: 12.9 g/dL (ref 11.7–15.5)
Lymphs Abs: 1592 cells/uL (ref 850–3900)
MCH: 31.8 pg (ref 27.0–33.0)
MCHC: 33.4 g/dL (ref 32.0–36.0)
MCV: 95.1 fL (ref 80.0–100.0)
MPV: 9.8 fL (ref 7.5–12.5)
Monocytes Relative: 8.6 %
Neutro Abs: 1782 cells/uL (ref 1500–7800)
Neutrophils Relative %: 46.9 %
Platelets: 306 10*3/uL (ref 140–400)
RBC: 4.06 10*6/uL (ref 3.80–5.10)
RDW: 11.9 % (ref 11.0–15.0)
Total Lymphocyte: 41.9 %
WBC: 3.8 10*3/uL (ref 3.8–10.8)

## 2021-02-10 LAB — LIPID PANEL
Cholesterol: 196 mg/dL (ref <200)
HDL: 61 mg/dL (ref >=50)
LDL Cholesterol (Calc): 116 mg/dL (calc) — ABNORMAL HIGH
Non-HDL Cholesterol (Calc): 135 mg/dL (calc) — ABNORMAL HIGH (ref <130)
Total CHOL/HDL Ratio: 3.2 (calc) (ref <5.0)
Triglycerides: 86 mg/dL (ref <150)

## 2021-02-10 LAB — TSH: TSH: 1.72 mIU/L

## 2021-02-10 LAB — HEPATITIS C ANTIBODY
Hepatitis C Ab: NONREACTIVE
SIGNAL TO CUT-OFF: 0.01 (ref <1.00)

## 2021-02-10 LAB — VITAMIN D 1,25 DIHYDROXY
Vitamin D 1, 25 (OH)2 Total: 25 pg/mL (ref 18–72)
Vitamin D2 1, 25 (OH)2: <8 pg/mL
Vitamin D3 1, 25 (OH)2: 25 pg/mL

## 2021-02-12 ENCOUNTER — Ambulatory Visit: Payer: 59 | Admitting: Orthopaedic Surgery

## 2021-02-18 ENCOUNTER — Ambulatory Visit: Payer: 59 | Admitting: Podiatry

## 2021-02-18 ENCOUNTER — Telehealth (HOSPITAL_COMMUNITY): Payer: Self-pay | Admitting: Psychiatry

## 2021-02-18 ENCOUNTER — Encounter: Payer: Self-pay | Admitting: Podiatry

## 2021-02-18 ENCOUNTER — Other Ambulatory Visit: Payer: Self-pay

## 2021-02-18 DIAGNOSIS — M722 Plantar fascial fibromatosis: Secondary | ICD-10-CM

## 2021-02-18 MED ORDER — METHYLPREDNISOLONE 4 MG PO TBPK: ORAL_TABLET | ORAL | 0 refills | Status: DC

## 2021-02-18 NOTE — Telephone Encounter (Signed)
Called to reschedule appt due to provider being out of the office for bereavement

## 2021-02-18 NOTE — Patient Instructions (Signed)
Call to schedule physical therapy:  [facility] [address]  [place]  [address] Main: [phone]

## 2021-02-20 ENCOUNTER — Ambulatory Visit (HOSPITAL_COMMUNITY): Payer: 59 | Admitting: Psychiatry

## 2021-02-20 NOTE — Progress Notes (Signed)
  Subjective:  Patient ID: Stacey Palmer, female    DOB: 04/16/1981,  MRN: [id number]  Chief Complaint  Patient presents with  . Foot Pain    "it was doing a little better, then the pain came back and it was really bad this past weekend"    40 y.o. female returns with the above complaint. History confirmed with patient.    Objective:  Physical Exam: warm, good capillary refill, no trophic changes or ulcerative lesions, normal DP and PT pulses and normal sensory exam.  Palpation to the medial plantar fascial and insertion of the tubercle, she has pes planus and mild hallux valgus deformity bilaterally  Radiographs: X-ray of both feet: no fracture, dislocation, swelling or degenerative changes noted, hallux valgus deformity and pes planus Assessment:   1. Plantar fasciitis      Plan:  Patient was evaluated and treated and all questions answered.  Discussed the etiology and treatment options for plantar fasciitis including stretching, formal physical therapy, supportive shoegears such as a running shoe or sneaker, pre fabricated orthoses, injection therapy, and oral medications. We also discussed the role of surgical treatment of this for patients who do not improve after exhausting non-surgical treatment options.   Plantar Fasciitis -XR reviewed with patient -Educated patient on stretching and icing of the affected limb -Injection delivered to the plantar fascia of the left foot. -Rx for meloxicam. Educated on use, risks and benefits of the medication -Rx for medrol pack. Educated on use, risks, and benefits of the medication -PT referral sent  After sterile prep with povidone-iodine solution and alcohol, the left heel was injected with 0.5cc 2% xylocaine plain, 0.5cc 0.5% marcaine plain, 5mg  triamcinolone acetonide, and 2mg  dexamethasone was injected along the plantar fascia at the insertion on the plantar calcaneus. The patient tolerated the procedure well without  complication.  Return in 23 days (on 03/13/2021) for recheck plantar fasciitis.

## 2021-02-21 ENCOUNTER — Encounter: Payer: Self-pay | Admitting: Podiatry

## 2021-02-21 ENCOUNTER — Ambulatory Visit: Payer: 59 | Admitting: Orthopaedic Surgery

## 2021-02-25 ENCOUNTER — Other Ambulatory Visit: Payer: Self-pay | Admitting: Family Medicine

## 2021-02-25 DIAGNOSIS — Z1231 Encounter for screening mammogram for malignant neoplasm of breast: Secondary | ICD-10-CM

## 2021-02-28 ENCOUNTER — Ambulatory Visit: Payer: 59 | Admitting: Orthopaedic Surgery

## 2021-03-05 ENCOUNTER — Ambulatory Visit: Payer: 59 | Admitting: Orthopaedic Surgery

## 2021-03-06 ENCOUNTER — Telehealth (HOSPITAL_COMMUNITY): Payer: Self-pay | Admitting: Psychiatry

## 2021-03-06 ENCOUNTER — Other Ambulatory Visit: Payer: Self-pay

## 2021-03-06 ENCOUNTER — Ambulatory Visit (HOSPITAL_COMMUNITY): Payer: 59 | Admitting: Psychiatry

## 2021-03-06 NOTE — Telephone Encounter (Signed)
Therapist attempted to contact patient twice via text through caregility platform for scheduled appointment, no response.  Therapist called patient, left message indicating attempt, and requesting patient call office. 

## 2021-03-13 ENCOUNTER — Ambulatory Visit (INDEPENDENT_AMBULATORY_CARE_PROVIDER_SITE_OTHER): Payer: 59 | Admitting: Podiatry

## 2021-03-18 ENCOUNTER — Other Ambulatory Visit: Payer: Self-pay | Admitting: Family Medicine

## 2021-03-20 ENCOUNTER — Other Ambulatory Visit: Payer: Self-pay

## 2021-03-20 ENCOUNTER — Ambulatory Visit (HOSPITAL_COMMUNITY): Payer: 59 | Admitting: Psychiatry

## 2021-03-20 ENCOUNTER — Telehealth (HOSPITAL_COMMUNITY): Payer: Self-pay | Admitting: Psychiatry

## 2021-03-20 NOTE — Telephone Encounter (Signed)
Therapist attempted to contact patient twice via text through caregility platform, no response.  Therapist called patient, left message indicating attempt, and requesting patient call office. 

## 2021-03-21 ENCOUNTER — Ambulatory Visit: Payer: 59 | Admitting: Podiatry

## 2021-03-24 ENCOUNTER — Other Ambulatory Visit: Payer: Self-pay | Admitting: Family Medicine

## 2021-03-24 DIAGNOSIS — K219 Gastro-esophageal reflux disease without esophagitis: Secondary | ICD-10-CM

## 2021-03-25 ENCOUNTER — Other Ambulatory Visit: Payer: Self-pay

## 2021-03-25 ENCOUNTER — Ambulatory Visit (INDEPENDENT_AMBULATORY_CARE_PROVIDER_SITE_OTHER): Payer: 59 | Admitting: Podiatry

## 2021-03-25 DIAGNOSIS — M722 Plantar fascial fibromatosis: Secondary | ICD-10-CM

## 2021-03-25 NOTE — Patient Instructions (Signed)
Call Roseanne Reno PT to begin therapy

## 2021-03-26 ENCOUNTER — Encounter: Payer: Self-pay | Admitting: Podiatry

## 2021-03-26 NOTE — Progress Notes (Signed)
  Subjective:  Patient ID: Stacey Palmer, female    DOB: 01/20/1981,  MRN: [id number]  Chief Complaint  Patient presents with  . Plantar Fasciitis     FLAT FOOT AND TENDINITIS    40 y.o. female returns with the above complaint. History confirmed with patient.  Pain is improved somewhat but is still painful.  She is inquiring about another injection today  Objective:  Physical Exam: warm, good capillary refill, no trophic changes or ulcerative lesions, normal DP and PT pulses and normal sensory exam.  Palpation to the medial plantar fascial and insertion of the tubercle, she has pes planus and mild hallux valgus deformity bilaterally  Radiographs: X-ray of both feet: no fracture, dislocation, swelling or degenerative changes noted, hallux valgus deformity and pes planus Assessment:   1. Plantar fasciitis      Plan:  Patient was evaluated and treated and all questions answered.  Discussed the etiology and treatment options for plantar fasciitis including stretching, formal physical therapy, supportive shoegears such as a running shoe or sneaker, pre fabricated orthoses, injection therapy, and oral medications. We also discussed the role of surgical treatment of this for patients who do not improve after exhausting non-surgical treatment options.   Plantar Fasciitis -Continue stretching and icing of the affected limb -Second injection delivered to the plantar fascia of the left foot. -Continue as needed meloxicam. Educated on use, risks and benefits of the medication After sterile prep with povidone-iodine solution and alcohol, the left heel was injected with 0.5cc 2% xylocaine plain, 0.5cc 0.5% marcaine plain, 5mg  triamcinolone acetonide, and 2mg  dexamethasone was injected along the plantar fascia at the insertion on the plantar calcaneus. The patient tolerated the procedure well without complication.  Return in about 8 weeks (around 05/20/2021) for recheck plantar fasciitis.

## 2021-03-27 ENCOUNTER — Ambulatory Visit (INDEPENDENT_AMBULATORY_CARE_PROVIDER_SITE_OTHER): Payer: 59 | Admitting: Psychiatry

## 2021-03-27 ENCOUNTER — Other Ambulatory Visit: Payer: Self-pay

## 2021-03-27 DIAGNOSIS — F324 Major depressive disorder, single episode, in partial remission: Secondary | ICD-10-CM | POA: Diagnosis not present

## 2021-03-27 DIAGNOSIS — F411 Generalized anxiety disorder: Secondary | ICD-10-CM | POA: Diagnosis not present
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
## 2021-04-08 ENCOUNTER — Other Ambulatory Visit (HOSPITAL_COMMUNITY)
Admission: RE | Admit: 2021-04-08 | Discharge: 2021-04-08 | Disposition: A | Discharge status: Home / Self Care | Payer: 59 | Source: Ambulatory Visit | Attending: Obstetrics & Gynecology | Admitting: Obstetrics & Gynecology

## 2021-04-08 ENCOUNTER — Ambulatory Visit (INDEPENDENT_AMBULATORY_CARE_PROVIDER_SITE_OTHER): Payer: 59 | Admitting: Obstetrics & Gynecology

## 2021-04-08 ENCOUNTER — Other Ambulatory Visit: Payer: Self-pay

## 2021-04-08 ENCOUNTER — Encounter: Payer: Self-pay | Admitting: Obstetrics & Gynecology

## 2021-04-08 VITALS — BP 114/68 | HR 81 | Ht 68.0 in | Wt 261.0 lb

## 2021-04-08 DIAGNOSIS — N898 Other specified noninflammatory disorders of vagina: Secondary | ICD-10-CM | POA: Diagnosis present

## 2021-04-08 DIAGNOSIS — L304 Erythema intertrigo: Secondary | ICD-10-CM | POA: Diagnosis not present

## 2021-04-08 MED ORDER — NORETHIN ACE-ETH ESTRAD-FE 1-20 MG-MCG(24) PO TABS: 1.0000 | ORAL_TABLET | Freq: Every day | ORAL | 1 refills | Status: DC

## 2021-04-08 MED ORDER — CLOTRIMAZOLE-BETAMETHASONE 1-0.05 % EX CREA: 1.0000 "application " | TOPICAL_CREAM | Freq: Two times a day (BID) | CUTANEOUS | 0 refills | Status: AC

## 2021-04-08 NOTE — Progress Notes (Signed)
   GYN VISIT Patient name: Stacey Palmer MRN [id number]  Date of birth: [birth year] Chief Complaint:   vaginal irritation  History of Present Illness:   CHIRSTY Palmer is a 40 y.o. [id number]  female being seen today for the following concerns:  Vaginal irritation: Ongoing for the past few days- tried OTC creams- monistat and vaseline with no improvement.  Noted irritation on right side of inner thigh as well.  Slight vaginal discharge and internal itching.  Maybe slight odor.  Minimal pelvic/ abdominal pain.  Of note, she does report using fragrant body washes  Monthly period regular, denies intermenstrual bleeding.    Contraception: condoms/pull out- interested in restarting on contraception  Patient's last menstrual period was 04/03/2021.  Depression screen [facility] 2/9 12/19/2020 12/10/2020 11/19/2020 10/01/2020 09/18/2020  Decreased Interest 0 0 0 0 0  Down, Depressed, Hopeless 0 0 0 0 0  PHQ - 2 Score 0 0 0 0 0  Altered sleeping - - - 0 -  Tired, decreased energy - - - 0 -  Change in appetite - - - 0 -  Feeling bad or failure about yourself  - - - 0 -  Trouble concentrating - - - 0 -  Moving slowly or fidgety/restless - - - 0 -  Suicidal thoughts - - - 0 -  PHQ-9 Score - - - 0 -  Difficult doing work/chores - - - - -  Some encounter information is confidential and restricted. Go to Review Flowsheets activity to see all data.  Some recent data might be hidden     Review of Systems:   Pertinent items are noted in HPI Denies fever/chills, dizziness, headaches, visual disturbances, fatigue, shortness of breath, chest pain, abdominal pain, vomiting, bowel movements, urination, or intercourse unless otherwise stated above.  Pertinent History Reviewed:  Reviewed past medical,surgical, social, obstetrical and family history.  Reviewed problem list, medications and allergies. Physical Assessment:   Vitals:   04/08/21 1526  BP: 114/68  Pulse: 81  Weight: 261 lb (118.4 kg)  Height: 5\' 8"   (1.727 m)  Body mass index is 39.68 kg/m.       Physical Examination:   General appearance: alert, well appearing, and in no distress  Psych: mood appropriate, normal affect  Skin: warm & dry   Cardiovascular: normal heart rate noted  Respiratory: normal respiratory effort, no distress  Abdomen: soft, non-tender, no rebound, no guarding  Pelvic: right inner thigh- irregular hyperpigmentation noted, normal external genitalia- no rash or lesions noted, vulvar unremarkable, VAGINA: vaginal discharge - white and creamy, CERVIX: normal appearing cervix without discharge or lesions  Extremities: no edema   Chaperone:    Assessment & Plan:  1) Vaginal irritation -plan to r/o underlying infection -concern for dermatitis/intertrig- lotrisone sent in -reviewed conservative therapy- discussed plan to avoid fragrant body wash/bubble baths etc.  Discussed common causes of vulvar dermatitis  -Contraception- upon further review of her chart- pt had prior tubal ligation.  OCPs not indicated  -advised f/u in Sept/Oct for annual  Return for Oct. 2022- annual.   11-25-1983, DO Attending Obstetrician & Gynecologist, [facility], [facility]

## 2021-04-09 ENCOUNTER — Telehealth: Payer: Self-pay | Admitting: Obstetrics & Gynecology

## 2021-04-09 NOTE — Telephone Encounter (Signed)
-----   Message from Myna Hidalgo, DO sent at 04/08/2021  4:18 PM EDT ----- Regarding: reminder Call back to let patient know she does not need OCPs

## 2021-04-09 NOTE — Telephone Encounter (Signed)
Left detailed message- as mentioned pt does not need OCP as she previously had tubal ligation

## 2021-04-10 ENCOUNTER — Ambulatory Visit (HOSPITAL_COMMUNITY): Payer: 59 | Admitting: Psychiatry

## 2021-04-10 ENCOUNTER — Other Ambulatory Visit: Payer: Self-pay

## 2021-04-10 ENCOUNTER — Telehealth (HOSPITAL_COMMUNITY): Payer: Self-pay | Admitting: Psychiatry

## 2021-04-10 LAB — CERVICOVAGINAL ANCILLARY ONLY
Bacterial Vaginitis (gardnerella): NEGATIVE
Candida Glabrata: NEGATIVE
Candida Vaginitis: NEGATIVE
Comment: NEGATIVE
Comment: NEGATIVE
Comment: NEGATIVE

## 2021-04-10 NOTE — Telephone Encounter (Signed)
Therapist attempted to contact patient twice via text through caregility platform, no response.  Therapist called patient, left message indicating attempt, and requesting patient call office. 

## 2021-04-17 ENCOUNTER — Other Ambulatory Visit: Payer: Self-pay

## 2021-04-17 ENCOUNTER — Ambulatory Visit
Admission: RE | Admit: 2021-04-17 | Discharge: 2021-04-17 | Disposition: A | Discharge status: Home / Self Care | Payer: 59 | Source: Ambulatory Visit | Attending: Family Medicine | Admitting: Family Medicine

## 2021-04-17 DIAGNOSIS — Z1231 Encounter for screening mammogram for malignant neoplasm of breast: Secondary | ICD-10-CM

## 2021-04-24 ENCOUNTER — Ambulatory Visit (INDEPENDENT_AMBULATORY_CARE_PROVIDER_SITE_OTHER): Payer: 59 | Admitting: Psychiatry

## 2021-04-24 ENCOUNTER — Telehealth (HOSPITAL_COMMUNITY): Payer: Self-pay | Admitting: Psychiatry

## 2021-04-24 ENCOUNTER — Other Ambulatory Visit: Payer: Self-pay

## 2021-04-24 DIAGNOSIS — F411 Generalized anxiety disorder: Secondary | ICD-10-CM
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
## 2021-04-24 NOTE — Telephone Encounter (Signed)
Therapist attempted to contact patient twice via text through caregility platform for scheduled appointment, no response.  Therapist called patient, left message indicating attempt, and requesting patient call office. 

## 2021-05-08 ENCOUNTER — Ambulatory Visit (INDEPENDENT_AMBULATORY_CARE_PROVIDER_SITE_OTHER): Payer: 59 | Admitting: Psychiatry

## 2021-05-08 ENCOUNTER — Other Ambulatory Visit: Payer: Self-pay

## 2021-05-08 DIAGNOSIS — F411 Generalized anxiety disorder: Secondary | ICD-10-CM | POA: Diagnosis not present

## 2021-05-08 DIAGNOSIS — F324 Major depressive disorder, single episode, in partial remission: Secondary | ICD-10-CM | POA: Diagnosis not present

## 2021-05-08 NOTE — Progress Notes (Signed)
Virtual Visit via Telephone Note  I connected with Stacey Palmer on 05/08/21 at 11:12 AM EDT by telephone and verified that I am speaking with the correct person using two identifiers.  Location: Patient: Home Provider: [facility] [place] office    I discussed the limitations, risks, security and privacy concerns of performing an evaluation and management service by telephone and the availability of in person appointments. I also discussed with the patient that there may be a patient responsible charge related to this service. The patient expressed understanding and agreed to proceed.  The patient was advised to call back or seek an in-person evaluation if the symptoms worsen or if the condition fails to improve as anticipated.  I provided 25 minutes of non-face-to-face time during this encounter.   Adah Salvage, LCSW    THERAPIST PROGRESS NOTE     Session Time:  Wednesday 05/08/2021 11:12 AM -  11:37 AM    Participation Level: Active  Behavioral Response: Alert. Anxious, depressed   Type of Therapy: Individual Therapy  Treatment Goals addressed: Patient states wanting to make better choices, feel better about self, develop healthy relationships, improve parenting skills (set limits)/develop healthy interpersonal relationships, healthy cognitive patterns about self and the world, enhance ability to handle effectively the full variety of life's anxieties  Interventionsdevelop healthy and up: CBT and Supportive  Summary: Stacey Palmer is a 40 y.o. female who  presents with symptoms of anxiety. She is a returning patient to this clinician.   Patient reports no psychiatric hospitalizations. She participated in outpatient therapy in [place] briefly last summer due to relationship issues.. She reports history of taking effexor briefly as prescribed by PCP.  She reports additional stress related to being a single mom with 3 children, working in a stressful job, and interpersonal  issues regarding contact with one of her sons father's family.  Patient last was seen via virtual visit about 2 weeks ago.  She reports decreased anxiety but continued stress and feeling a little down at times.  She reports being very tired as work is short staffed due to a coworker recently resigning and patient has assumed additional responsibilities at work.   she is pleased with her efforts to continue to set and maintain boundaries at work especially regarding relationships with coworkers who tend to make a lot of negative comments per patient's report.  She reports she did express interest in a female coworker and try to pursue having dinner but later realized this probably would not be a healthy relationship.  She reports she began to recognize red flags.  Patient reports she now is beginning to journal what she is looking for in a relationship.  She continues to report limited support and expresses frustration regarding being a single parent.  She also expresses frustration as children are home all day and do not have chores completed when she arrives home from work.  Suicidal/Homicidal: Nowithout intent/plan  Therapist Response:  reviewed symptoms, praised and reinforced patient's efforts to set and maintain boundaries at work, assisted patient examine her interaction with female coworker and identify healthy and unhealthy aspects, encouraged patient to continue efforts using her journal, praised and reinforced patient's efforts to try to respond wisely rather than reacting as she has in the past, discussed stressors, facilitated expression of thoughts and feelings, validated feelings, assisted patient identify ways to set realistic expectations regarding chores for children, assisted patient identify ways to develop a chore list along with rewards and consequences for children,  Plan: Return again in 2 weeks.  Diagnosis: Axis I: Generalized Anxiety Disorder    MDD      Adah Salvage,  LCSW 05/08/2021

## 2021-05-20 ENCOUNTER — Other Ambulatory Visit: Payer: Self-pay

## 2021-05-20 ENCOUNTER — Ambulatory Visit (INDEPENDENT_AMBULATORY_CARE_PROVIDER_SITE_OTHER): Payer: 59 | Admitting: Podiatry

## 2021-05-20 DIAGNOSIS — M722 Plantar fascial fibromatosis: Secondary | ICD-10-CM

## 2021-05-20 DIAGNOSIS — M76822 Posterior tibial tendinitis, left leg: Secondary | ICD-10-CM

## 2021-05-20 DIAGNOSIS — M76821 Posterior tibial tendinitis, right leg: Secondary | ICD-10-CM

## 2021-05-20 NOTE — Patient Instructions (Signed)
[facility] [address] [place],  [place]  [address]  Main: [phone] 

## 2021-05-21 ENCOUNTER — Encounter: Payer: Self-pay | Admitting: Podiatry

## 2021-05-21 NOTE — Progress Notes (Signed)
  Subjective:  Patient ID: Stacey Palmer, female    DOB: 07/17/1981,  MRN: [id number]  Chief Complaint  Patient presents with   Plantar Fasciitis     recheck plantar fasciitis bilateral    40 y.o. female returns with the above complaint. History confirmed with patient.  She was doing quite well for some time but pain has returned she thinks he may need another injection.  She did not start physical therapy  Objective:  Physical Exam: warm, good capillary refill, no trophic changes or ulcerative lesions, normal DP and PT pulses and normal sensory exam.  Palpation to the medial plantar fascial and insertion of the tubercle, she has pes planus and mild hallux valgus deformity bilaterally  Radiographs: X-ray of both feet: no fracture, dislocation, swelling or degenerative changes noted, hallux valgus deformity and pes planus Assessment:   1. 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  Discussed the etiology and treatment options for plantar fasciitis including stretching, formal physical therapy, supportive shoegears such as a running shoe or sneaker, pre fabricated orthoses, injection therapy, and oral medications. We also discussed the role of surgical treatment of this for patients who do not improve after exhausting non-surgical treatment options.   Plantar Fasciitis -Continue stretching and icing of the affected limb -Repeat injection delivered to the plantar fascia of the left foot. -Continue as needed meloxicam. Educated on use, risks and benefits of the medication After sterile prep with povidone-iodine solution and alcohol, the left heel was injected with 0.5cc 2% xylocaine plain, 0.5cc 0.5% marcaine plain, 5mg  triamcinolone acetonide, and 2mg  dexamethasone was injected along the plantar fascia at the insertion on the plantar calcaneus. The patient tolerated  the procedure well without complication. -Physical therapy prescription will be sent for [facility] location, this would be more convenient for her  Return in about 2 months (around 07/21/2021).

## 2021-05-22 ENCOUNTER — Other Ambulatory Visit: Payer: Self-pay

## 2021-05-22 ENCOUNTER — Ambulatory Visit (INDEPENDENT_AMBULATORY_CARE_PROVIDER_SITE_OTHER): Payer: 59 | Admitting: Psychiatry

## 2021-05-22 DIAGNOSIS — F324 Major depressive disorder, single episode, in partial remission: Secondary | ICD-10-CM | POA: Diagnosis not present

## 2021-05-22 DIAGNOSIS — F411 Generalized anxiety disorder: Secondary | ICD-10-CM

## 2021-05-22 NOTE — Progress Notes (Signed)
Virtual Visit via Video Note  I connected with Stacey Palmer on 05/22/21 at  4:00 PM EDT by a video enabled telemedicine application and verified that I am speaking with the correct person using two identifiers.  Location: Patient: Home Provider:  [facility] [place] office    I discussed the limitations of evaluation and management by telemedicine and the availability of in person appointments. The patient expressed understanding and agreed to proceed.  I provided 50  minutes of non-face-to-face time during this encounter.   Adah Salvage, LCSW     THERAPIST PROGRESS NOTE     Session Time:  Wednesday 05/22/2021 4:00 PM - 4:50 PM    Participation Level: Active  Behavioral Response: Alert. Anxious, depressed   Type of Therapy: Individual Therapy  Treatment Goals addressed: Patient states wanting to make better choices, feel better about self, develop healthy relationships, improve parenting skills (set limits)/develop healthy interpersonal relationships, healthy cognitive patterns about self and the world, enhance ability to handle effectively the full variety of life's anxieties  Interventionsdevelop healthy and up: CBT and Supportive  Summary: Stacey Palmer is a 40 y.o. female who  presents with symptoms of anxiety. She is a returning patient to this clinician.   Patient reports no psychiatric hospitalizations. She participated in outpatient therapy in [place] briefly last summer due to relationship issues.. She reports history of taking effexor briefly as prescribed by PCP.  She reports additional stress related to being a single mom with 3 children, working in a stressful job, and interpersonal issues regarding contact with one of her sons father's family.  Patient last was seen via virtual visit about 2 weeks ago.  She reports continued symptoms of anxiety including excessive worry and sleep difficulty.  She also reports sometimes experiencing depressed mood as she  feels overwhelmed and has little support.  She also reports fatigue as she has been trying to pick up extra shifts at work to address financial issues.  She reports stress regarding financial issues, relationship issues, and parenting.  She reports implementing plan of developing actual list with rewards and consequences for children but difficulty enforcing consequences.  She reports increased rumination about a female coworker and reports recently texting with the coworker.  She reports being stressed by their interaction as she says she sees red flags but continues to try to pursue contact with coworker.    Suicidal/Homicidal: Nowithout intent/plan  Therapist Response:  reviewed symptoms, praised and reinforced patient's efforts to try to implement plan regarding developing chore list for children, assisted patient examine her pattern of interaction regarding following through with with rewards/consequences, assisted patient identify ways to set clear expectations as well as ways to be consistent regarding consequences/rewards, discussed stressors, facilitated expression of thoughts and feelings, validated feelings, assisted patient with problem solving regarding identify ways to reduce expenses, also provided patient contact information regarding career center at work as patient wants to eventually go to school to pursue a career, assisted patient examine her pattern of interaction with her female coworker, assisted patient identify ways to set maintain limits regarding contact with coworker, also assisted patient identify pleasurable activities she would like to pursue to cope with depression, develop plan with patient to participate in 1 pleasurable activity per day,  Plan: Return again in 2 weeks.  Diagnosis: Axis I: Generalized Anxiety Disorder    MDD      Adah Salvage, LCSW 05/22/2021

## 2021-05-28 ENCOUNTER — Ambulatory Visit: Payer: Self-pay

## 2021-05-28 ENCOUNTER — Ambulatory Visit
Admission: EM | Admit: 2021-05-28 | Discharge: 2021-05-28 | Disposition: A | Discharge status: Home / Self Care | Payer: 59 | Attending: Family Medicine | Admitting: Family Medicine

## 2021-05-28 DIAGNOSIS — J069 Acute upper respiratory infection, unspecified: Secondary | ICD-10-CM

## 2021-05-28 DIAGNOSIS — R110 Nausea: Secondary | ICD-10-CM

## 2021-05-28 LAB — POCT URINALYSIS DIP (MANUAL ENTRY)
Bilirubin, UA: NEGATIVE
Blood, UA: NEGATIVE
Glucose, UA: NEGATIVE mg/dL
Leukocytes, UA: NEGATIVE
Nitrite, UA: NEGATIVE
Protein Ur, POC: NEGATIVE mg/dL
Spec Grav, UA: 1.025 (ref 1.010–1.025)
Urobilinogen, UA: 1 E.U./dL
pH, UA: 6.5 (ref 5.0–8.0)

## 2021-05-28 MED ORDER — BENZONATATE 100 MG PO CAPS: ORAL_CAPSULE | ORAL | 0 refills | Status: DC

## 2021-05-28 MED ORDER — ONDANSETRON 4 MG PO TBDP: 4.0000 mg | ORAL_TABLET | Freq: Three times a day (TID) | ORAL | 0 refills | Status: DC | PRN

## 2021-05-28 NOTE — ED Triage Notes (Signed)
Pt presents with c/o nasal congestion , cough and dysuria for past 3 dys

## 2021-05-28 NOTE — Discharge Instructions (Addendum)
You have been tested for COVID-19 today. °If your test returns positive, you will receive a phone call from [facility] regarding your results. °Negative test results are not called. °Both positive and negative results area always visible on MyChart. °If you do not have a MyChart account, sign up instructions are provided in your discharge papers. °Please do not hesitate to contact us should you have questions or concerns. ° °

## 2021-05-28 NOTE — ED Provider Notes (Signed)
[facility]   [id number] 05/28/21 Arrival Time: 1631  ASSESSMENT & PLAN:  1. Viral URI with cough   2. Nausea    Discussed typical duration of viral illnesses. Labs Reviewed  POCT URINALYSIS DIP (MANUAL ENTRY) - Abnormal; Notable for the following components:      Result Value   Ketones, POC UA trace (5) (*)    All other components within normal limits  NOVEL CORONAVIRUS, NAA  Encouraged fluid intake. No signs of infection on U/A. COVID-19 testing sent. Work note provided. OTC symptom care as needed.  Meds ordered this encounter  Medications   benzonatate (TESSALON) 100 MG capsule    Sig: Take 1 capsule by mouth every 8 (eight) hours for cough.    Dispense:  21 capsule    Refill:  0   ondansetron (ZOFRAN-ODT) 4 MG disintegrating tablet    Sig: Take 1 tablet (4 mg total) by mouth every 8 (eight) hours as needed for nausea or vomiting.    Dispense:  15 tablet    Refill:  0     Follow-up Information      Urgent Care at [facility].   Specialty: Urgent Care Why: If worsening or failing to improve as anticipated. Contact information: [address] [place] [address] [phone]               Reviewed expectations re: course of current medical issues. Questions answered. Outlined signs and symptoms indicating need for more acute intervention. Understanding verbalized. After Visit Summary given.  SUBJECTIVE: History from: patient. Stacey Palmer is a 40 y.o. female who presents with worries regarding COVID-19. Known COVID-19 contact: none. Recent travel: none. Reports: cough and nasal congestion; abrupt onset; x 3 days. Also with occasional dysuria over past few days; no hematuria. Slight nausea without emesis. Denies: headache. Overall decreased PO intake. No diarrhea.  OBJECTIVE:  Vitals:   05/28/21 1718  BP: (!) 144/94  Pulse: 61  Resp: 14  Temp: 98.2 F (36.8 C)  TempSrc: Tympanic  SpO2: 98%     General appearance: alert; no distress Eyes: PERRLA; EOMI; conjunctiva normal HENT: [place]; AT; with nasal congestion Neck: supple  Lungs: speaks full sentences without difficulty; unlabored; active cough Extremities: no edema Skin: warm and dry Neurologic: normal gait Psychological: alert and cooperative; normal mood and affect  Labs: Results for orders placed or performed during the hospital encounter of 05/28/21  POCT urinalysis dipstick  Result Value Ref Range   Color, UA yellow yellow   Clarity, UA clear clear   Glucose, UA negative negative mg/dL   Bilirubin, UA negative negative   Ketones, POC UA trace (5) (A) negative mg/dL   Spec Grav, UA 8.657 8.469 - 1.025   Blood, UA negative negative   pH, UA 6.5 5.0 - 8.0   Protein Ur, POC negative negative mg/dL   Urobilinogen, UA 1.0 0.2 or 1.0 E.U./dL   Nitrite, UA Negative Negative   Leukocytes, UA Negative Negative   Labs Reviewed  POCT URINALYSIS DIP (MANUAL ENTRY) - Abnormal; Notable for the following components:      Result Value   Ketones, POC UA trace (5) (*)    All other components within normal limits  NOVEL CORONAVIRUS, NAA    No Known Allergies  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   pHQ 9 SCORE OF 12 IN 12/2018, score of  0 in 03/2019 on medication   Dry eyes 12/26/2018   H/O gastric sleeve 2018   History of mammogram 2021   Obesity    Sleep apnea    Social History   Socioeconomic History   Marital status: Single    Spouse name: Not on file   Number of children: Not on file   Years of education: Not on file   Highest education level: Not on file  Occupational History   Not on file  Tobacco Use   Smoking status: Never   Smokeless tobacco: Never  Vaping Use   Vaping Use: Never used  Substance and Sexual Activity   Alcohol use: No   Drug use: No   Sexual activity: Not Currently    Birth  control/protection: Surgical    Comment: tubal  Other Topics Concern   Not on file  Social History Narrative   Tobacco use, amount per day now:   Past tobacco use, amount per day:   How many years did you use tobacco:   Alcohol use (drinks per week): Once in a while.   Diet:   Do you drink/eat things with caffeine:   Marital status:  Single.                                What year were you married?   Do you live in a house, apartment, assisted living, condo, trailer, etc.?   Is it one or more stories?   How many persons live in your home?   Do you have pets in your home?( please list) Yes Cats   Highest Level of education completed?   Current or past profession:   Do you exercise? Yes                                 Type and how often?   Do you have a living will? No.   Do you have a DNR form? No                                  If not, do you want to discuss one?   Do you have signed POA/HPOA forms? No.                       If so, please bring to you appointment      Do you have any difficulty bathing or dressing yourself? No.   Do you have any difficulty preparing food or eating? No.   Do you have any difficulty managing your medications? No.   Do you have any difficulty managing your finances? No.   Do you have any difficulty affording your medications? No.   Social Determinants of Health   Financial Resource Strain: Medium Risk   Difficulty of Paying Living Expenses: Somewhat hard  Food Insecurity: No Food Insecurity   Worried About Programme researcher, broadcasting/film/video in the Last Year: Never true   Ran Out of Food in the Last Year: Never true  Transportation Needs: No Transportation Needs   Lack of Transportation (Medical): No   Lack of Transportation (Non-Medical): No  Physical Activity: Sufficiently Active   Days of Exercise per Week: 7 days   Minutes of Exercise per Session: 30 min  Stress: Stress Concern Present  Feeling of Stress : Rather much  Social Connections: Moderately  Integrated   Frequency of Communication with Friends and Family: Twice a week   Frequency of Social Gatherings with Friends and Family: Twice a week   Attends Religious Services: More than 4 times per year   Active Member of Clubs or Organizations: No   Attends Engineer, structural: 1 to 4 times per year   Marital Status: Never married  Catering manager Violence: Not At Risk   Fear of Current or Ex-Partner: No   Emotionally Abused: No   Physically Abused: No   Sexually Abused: No   Family History  Problem Relation Age of Onset   Hypertension Mother    Kidney disease Father    Diabetes Father    Breast cancer Paternal Aunt    Dementia Paternal Grandmother    Asthma Daughter    Past Surgical History:  Procedure Laterality Date   BARIATRIC SURGERY  2018   CESAREAN SECTION     TUBAL LIGATION       Mardella Layman, MD 05/28/21 1750

## 2021-05-29 ENCOUNTER — Encounter: Payer: Self-pay | Admitting: Family

## 2021-05-29 LAB — SARS-COV-2, NAA 2 DAY TAT

## 2021-05-29 LAB — NOVEL CORONAVIRUS, NAA: SARS-CoV-2, NAA: NOT DETECTED

## 2021-05-29 NOTE — Progress Notes (Signed)
Provider: Richarda Blade FNP-C    Location: [facility]  Ngetich, Donalee Citrin, NP  Patient Care Team: Ngetich, Donalee Citrin, NP as PCP - General (Family Medicine) Beryle Beams, MD as Consulting Physician (Neurology)  Extended Emergency Contact Information Primary Emergency Contact: Siddique,Gaynelle Address: [place] [address] [place] Home Phone: [phone] Mobile Phone: [phone] Relation: Mother Secondary Emergency Contact: [place], [place] [address] [place] Home Phone: [phone] Mobile Phone: [phone] Relation: Friend  Code Status:  FULL CODE Goals of care: Advanced Directive information Advanced Directives 05/29/2021  Does Patient Have a Medical Advance Directive? No  Would patient like information on creating a medical advance directive? No - Patient declined     Chief Complaint  Patient presents with   Medical Management of Chronic Issues    4 month follow up.   Immunizations    Discuss the need for PNE vaccine, and 2nd Covid Booster.    HPI:  Pt is a 40 y.o. female seen today for medical management of chronic diseases.     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 If you have any questions, ask your nurse or doctor.          benzonatate 100 MG capsule Commonly known as: TESSALON Take 1 capsule by mouth every 8 (eight) hours for cough.    fluticasone 50 MCG/ACT nasal spray Commonly known as: FLONASE Place 2 sprays into both nostrils daily.   IRON PO Take 1 capsule by mouth daily.   levocetirizine 5 MG tablet Commonly known as: XYZAL Take 1 tablet (5 mg total) by mouth every evening.   meloxicam 15 MG tablet Commonly known as: Mobic Take 1 tablet (15 mg total) by mouth daily.   methylPREDNISolone 4 MG Tbpk tablet Commonly known as: MEDROL DOSEPAK 6 day dose pack - take as directed   ondansetron 4 MG disintegrating tablet Commonly known as: ZOFRAN-ODT Take 1 tablet (4 mg total) by mouth every 8 (eight) hours as needed for nausea or vomiting.   pantoprazole 40 MG tablet Commonly known as: PROTONIX Take 1 tablet (40 mg total) by mouth daily.   prednisoLONE acetate 1 % ophthalmic suspension Commonly known as: PRED FORTE SMARTSIG:In Eye(s)   venlafaxine XR 75 MG 24 hr capsule Commonly known as: EFFEXOR-XR Take 1 capsule (75 mg total) by mouth daily with breakfast. Dose increase effective 03/24/2019   Vitamin D (Ergocalciferol) 1.25 MG (50000 UNIT) Caps capsule Commonly known as: DRISDOL TAKE 1 CAPSULE BY MOUTH ONCE A WEEK AS DIRECTED   VITAMIN D3 PO Take 1 capsule by mouth once a week.        Review  of Systems  Immunization History  Administered Date(s) Administered   Influenza Split 10/06/2011, 09/01/2012, 07/23/2014   Influenza Whole 08/17/2007, 09/13/2009   Influenza,inj,Quad PF,6+ Mos 08/24/2013, 08/06/2016, 07/22/2017, 08/19/2018, 08/08/2019, 07/27/2020   Moderna Sars-Covid-2 Vaccination 12/09/2019, 01/06/2020, 08/27/2020   PPD Test 06/02/2014   Td 03/05/2005   Tdap 09/20/2010, 10/06/2011   Pertinent  Health Maintenance Due  Topic Date Due   INFLUENZA VACCINE  06/03/2021   PAP SMEAR-Modifier  08/19/2021   Fall Risk  05/29/2021 04/08/2021 01/31/2021 12/19/2020 12/10/2020  Falls in the past year? 0 1 1 0 0  Number falls in past yr: 0 1 0 0 0  Injury with Fall? 0 0 1 0 0  Risk for fall due to  : No Fall Risks Impaired balance/gait - No Fall Risks No Fall Risks  Follow up Falls evaluation completed Falls evaluation completed - Falls evaluation completed Falls evaluation completed   Functional Status Survey:    There were no vitals filed for this visit. There is no height or weight on file to calculate BMI. Physical Exam  Labs reviewed: Recent Labs    12/19/20 0919 02/06/21 0845  NA 141 138  K 4.3 4.2  CL 104 102  CO2 24 28  GLUCOSE 80 81  BUN 10 13  CREATININE 0.73 0.74  CALCIUM 9.4 9.1   Recent Labs    02/06/21 0845  AST 12  ALT 9  BILITOT 0.6  PROT 6.8   Recent Labs    12/19/20 0919 02/06/21 0845  WBC 4.2 3.8  NEUTROABS 1.7 1,782  HGB 12.6 12.9  HCT 37.7 38.6  MCV 93 95.1  PLT 280 306   Lab Results  Component Value Date   TSH 1.72 02/06/2021   Lab Results  Component Value Date   HGBA1C 4.9 06/04/2017   Lab Results  Component Value Date   CHOL 196 02/06/2021   HDL 61 02/06/2021   LDLCALC 116 (H) 02/06/2021   TRIG 86 02/06/2021   CHOLHDL 3.2 02/06/2021    Significant Diagnostic Results in last 30 days:  No results found.  Assessment/Plan There are no diagnoses linked to this encounter.   Family/ staff Communication:   Labs/tests ordered:

## 2021-05-30 ENCOUNTER — Other Ambulatory Visit: Payer: Self-pay

## 2021-05-30 ENCOUNTER — Ambulatory Visit (INDEPENDENT_AMBULATORY_CARE_PROVIDER_SITE_OTHER): Payer: 59 | Admitting: Family

## 2021-05-30 DIAGNOSIS — J309 Allergic rhinitis, unspecified: Secondary | ICD-10-CM

## 2021-05-30 DIAGNOSIS — E559 Vitamin D deficiency, unspecified: Secondary | ICD-10-CM | POA: Diagnosis not present

## 2021-05-30 DIAGNOSIS — J069 Acute upper respiratory infection, unspecified: Secondary | ICD-10-CM

## 2021-05-30 DIAGNOSIS — F411 Generalized anxiety disorder: Secondary | ICD-10-CM

## 2021-05-30 DIAGNOSIS — K219 Gastro-esophageal reflux disease without esophagitis: Secondary | ICD-10-CM

## 2021-05-30 DIAGNOSIS — Z23 Encounter for immunization: Secondary | ICD-10-CM

## 2021-05-30 DIAGNOSIS — Z6841 Body Mass Index (BMI) 40.0 and over, adult: Secondary | ICD-10-CM

## 2021-05-30 MED ORDER — VITAMIN C 250 MG PO TABS: 250.0000 mg | ORAL_TABLET | Freq: Two times a day (BID) | ORAL | 0 refills | Status: AC

## 2021-05-30 NOTE — Progress Notes (Signed)
This service is provided via telemedicine  No vital signs collected/recorded due to the encounter was a telemedicine visit.   Location of patient (ex: home, work):  Home.   Patient consents to a telephone visit: Yes.  Location of the provider (ex: office, home): [employer].   Name of any referring provider: , Stacey Bucks, NP   Names of all persons participating in the telemedicine service and their role in the encounter: Patient, Stacey Palmer, Stacey Palmer, Stacey Palmer, Stacey Silversmith, NP.    Time spent on call: 8 minutes spent on the phone with Medical Assistant.      Provider: Marlowe Sax FNP-C  , Stacey Bucks, NP  Patient Care Team: , Stacey Bucks, NP as PCP - General (Family Medicine) Stacey Odor, MD as Consulting Physician (Neurology)  Extended Emergency Contact Information Primary Emergency Contact: Stacey Palmer Address: [place] [address] [place] Phone: [phone] Mobile Phone: [phone] Relation: Mother Secondary Emergency Contact: Palmer, [place] [address] Stacey Palmer of [place] Phone: [phone] Mobile Phone: [phone] Relation: Friend  Code Status:  Full Code  Goals of care: Advanced Directive information Advanced Directives 05/29/2021  Does Patient Have a Medical Advance Directive? No  Would patient like information on creating a medical advance directive? No - Patient declined     Chief Complaint  Patient presents with   Medical Management of Chronic Issues    4 month follow up.   Immunizations    Discuss the need for PNE vaccine, and 2nd Covid Booster.    HPI:  Pt is a 40 y.o. female seen today for 4 months follow up for medical management of chronic issues.she complains of cough,General body aches,sore throat  and right ear ache,headache  x 3 days.Also had dysuria.she was seen in [facility] 05/28/2021 for viral URI and nausea.states COVID-19 test was  negative.Urine specimen was negative for UTI.she was send home on Benzonate  100 mg capsule every 8 hrs for cough and Zofran 4 mg tablet every 8 hrs as needed for nausea. States nausea ha improved.states Benzonate capsule not helping with her cough prefers OTC mucinex. Has had no fever or chills but feels warm than usual.  She was unable to come in to office for her visit today.  She denies any shortness of breath,palpitation or wheezing. Overall symptoms seems to be better than yesterday. 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   Cholecalciferol (VITAMIN D3 PO) Take 1 capsule by mouth once a week.   Ferrous Sulfate (IRON PO) Take 1 capsule by mouth daily.   fluticasone (FLONASE) 50 MCG/ACT nasal  spray Place 2 sprays into both nostrils daily.   levocetirizine (XYZAL) 5 MG tablet Take 1 tablet (5 mg total) by mouth every evening.   meloxicam (MOBIC) 15 MG tablet Take 1 tablet (15 mg total) by mouth daily.   methylPREDNISolone (MEDROL DOSEPAK) 4 MG TBPK tablet 6 day dose pack - take as directed   ondansetron (ZOFRAN-ODT) 4 MG disintegrating tablet Take 1 tablet (4 mg total) by mouth every 8 (eight) hours as needed for nausea or vomiting.   pantoprazole (PROTONIX) 40 MG tablet Take 1 tablet (40 mg total) by mouth daily.   prednisoLONE acetate (PRED  FORTE) 1 % ophthalmic suspension SMARTSIG:In Eye(s)   venlafaxine XR (EFFEXOR-XR) 75 MG 24 hr capsule Take 1 capsule (75 mg total) by mouth daily with breakfast. Dose increase effective 03/24/2019   Vitamin D, Ergocalciferol, (DRISDOL) 1.25 MG (50000 UNIT) CAPS capsule TAKE 1 CAPSULE BY MOUTH ONCE A WEEK AS DIRECTED   No facility-administered encounter medications on file as of 05/30/2021.    Review of Systems  Constitutional:  Negative for appetite change, chills, fatigue and unexpected weight change.       Tactile fever  HENT:  Positive for congestion, ear pain, rhinorrhea and sore throat. Negative for dental problem, ear discharge, facial swelling, hearing loss, nosebleeds, postnasal drip, sinus pressure, sinus pain, sneezing, tinnitus and trouble swallowing.        Right ear pain   Eyes:  Negative for pain, discharge, redness, itching and visual disturbance.  Respiratory:  Positive for cough. Negative for chest tightness, shortness of breath and wheezing.   Cardiovascular:  Negative for chest pain, palpitations and leg swelling.  Gastrointestinal:  Negative for abdominal distention, abdominal pain, blood in stool, constipation, diarrhea, nausea and vomiting.  Endocrine: Negative for cold intolerance, heat intolerance, polydipsia, polyphagia and polyuria.  Genitourinary:  Negative for difficulty urinating, dysuria, flank pain, frequency and urgency.  Musculoskeletal:  Negative for arthralgias, back pain, gait problem, joint swelling, myalgias, neck pain and neck stiffness.  Skin:  Negative for color change, pallor, rash and wound.  Neurological:  Negative for dizziness, syncope, speech difficulty, weakness, light-headedness, numbness and headaches.  Hematological:  Does not bruise/bleed easily.  Psychiatric/Behavioral:  Negative for agitation, behavioral problems, confusion, hallucinations, self-injury, sleep disturbance and suicidal ideas. The patient is not nervous/anxious.     Immunization History  Administered Date(s) Administered   Influenza Split 10/06/2011, 09/01/2012, 07/23/2014   Influenza Whole 08/17/2007, 09/13/2009   Influenza,inj,Quad PF,6+ Mos 08/24/2013, 08/06/2016, 07/22/2017, 08/19/2018, 08/08/2019, 07/27/2020   Moderna Sars-Covid-2 Vaccination 12/09/2019, 01/06/2020, 08/27/2020   PPD Test 06/02/2014   Td 03/05/2005   Tdap 09/20/2010, 10/06/2011   Pertinent  Health Maintenance Due  Topic Date Due   INFLUENZA VACCINE  06/03/2021   PAP SMEAR-Modifier  08/19/2021   Fall Risk  05/29/2021 04/08/2021 01/31/2021 12/19/2020 12/10/2020  Falls in the past year? 0 1 1 0 0  Number falls in past yr: 0 1 0 0 0  Injury with Fall? 0 0 1 0 0  Risk for fall due to : No Fall Risks Impaired balance/gait - No Fall Risks No Fall Risks  Follow up Falls evaluation completed Falls evaluation completed - Falls evaluation completed Falls evaluation completed   Functional Status Survey:    There were no vitals filed for this visit. There is no height or weight on file to calculate BMI. Physical Exam Vitals reviewed.  Constitutional:      General: She is not in acute distress.    Appearance: Normal  appearance. She is not ill-appearing.  Pulmonary:     Effort: Pulmonary effort is normal. No respiratory distress.  Neurological:     Mental Status: She is alert.  Psychiatric:        Mood and Affect: Mood normal.        Speech: Speech normal.        Behavior: Behavior normal.        Thought Content: Thought content normal.        Judgment: Judgment normal.    Labs reviewed: Recent Labs    12/19/20 0919 02/06/21 0845  NA 141 138  K 4.3 4.2  CL 104 102  CO2 24 28  GLUCOSE 80 81  BUN 10 13  CREATININE 0.73 0.74  CALCIUM 9.4 9.1   Recent Labs    02/06/21 0845  AST 12  ALT 9  BILITOT 0.6  PROT 6.8   Recent Labs    12/19/20 0919 02/06/21 0845  WBC 4.2 3.8  NEUTROABS 1.7 1,782  HGB 12.6 12.9  HCT 37.7 38.6  MCV 93 95.1  PLT 280 306   Lab  Results  Component Value Date   TSH 1.72 02/06/2021   Lab Results  Component Value Date   HGBA1C 4.9 06/04/2017   Lab Results  Component Value Date   CHOL 196 02/06/2021   HDL 61 02/06/2021   LDLCALC 116 (H) 02/06/2021   TRIG 86 02/06/2021   CHOLHDL 3.2 02/06/2021    Significant Diagnostic Results in last 30 days:  No results found.  Assessment/Plan 1. GAD (generalized anxiety disorder) Stable  Continue on Venlafaxine XR  - TSH; Future  2. Gastroesophageal reflux disease, unspecified whether esophagitis present Continue on Protonix  - CBC with Differential/Platelet; Future - CMP with eGFR([facility]); Future  3. Allergic rhinitis, unspecified seasonality, unspecified trigger Continue on levocetirizine   4. Vitamin D deficiency Continue on supplement  - Vitamin D, 1,25-dihydroxy; Future  5. Morbid obesity ([place]) Dietary modification and exercise advised - Lipid panel; Future - TSH; Future  6. BMI 40.0-44.9, adult (HCC) Dietary modification as above  - Lipid panel; Future - TSH; Future  7. Upper respiratory tract infection, unspecified type Febrile. Add vitamin C and continue on vit D,mucinex  Retest for COVID-19 with Home Kit  - vitamin C (ASCORBIC ACID) 250 MG tablet; Take 1 tablet (250 mg total) by mouth 2 (two) times daily for 14 days.  Dispense: 28 tablet; Refill: 0 - work note written to return to work 06/03/2021 if no fever over 24 hrs.   Family/ staff Communication: Reviewed plan of care with patient verbalized understanding  Labs/tests ordered:  - CBC with Differential/Platelet - CMP with eGFR([facility]) - TSH - Lipid panel - Vitamin D, 1,25-dihydroxy; Future  Next Appointment: 4 months for medical management of chronic issues.Fasting labs prior to visit.  I connected with  [name] on 05/30/21 by a video enabled telemedicine application and verified that I am speaking with the correct person using two identifiers.   I discussed the limitations of  evaluation and management by telemedicine. The patient expressed understanding and agreed to proceed.  Spent 25 minutes of face to face with patient    Sandrea Hughs, NP

## 2021-06-05 ENCOUNTER — Other Ambulatory Visit: Payer: Self-pay

## 2021-06-05 ENCOUNTER — Ambulatory Visit (HOSPITAL_COMMUNITY): Payer: 59 | Admitting: Psychiatry

## 2021-06-06 ENCOUNTER — Other Ambulatory Visit: Payer: Self-pay

## 2021-06-06 ENCOUNTER — Ambulatory Visit (HOSPITAL_COMMUNITY): Payer: 59 | Admitting: Psychiatry

## 2021-06-10 ENCOUNTER — Other Ambulatory Visit: Payer: Self-pay

## 2021-06-10 DIAGNOSIS — Z6841 Body Mass Index (BMI) 40.0 and over, adult: Secondary | ICD-10-CM

## 2021-06-10 DIAGNOSIS — K219 Gastro-esophageal reflux disease without esophagitis: Secondary | ICD-10-CM

## 2021-06-10 DIAGNOSIS — F411 Generalized anxiety disorder: Secondary | ICD-10-CM

## 2021-06-10 DIAGNOSIS — E559 Vitamin D deficiency, unspecified: Secondary | ICD-10-CM

## 2021-06-13 ENCOUNTER — Ambulatory Visit (HOSPITAL_COMMUNITY): Payer: 59 | Admitting: Psychiatry

## 2021-06-13 ENCOUNTER — Other Ambulatory Visit: Payer: Self-pay

## 2021-06-13 ENCOUNTER — Telehealth (HOSPITAL_COMMUNITY): Payer: Self-pay | Admitting: Psychiatry

## 2021-06-13 NOTE — Telephone Encounter (Signed)
Therapist attempted to contact patient twice via text through caregility platform for scheduled appointment, no response.  Therapist called patient and received message voice mailbox was full.  However, therapist was able to send office phone number via SMS.

## 2021-06-19 ENCOUNTER — Other Ambulatory Visit: Payer: 59

## 2021-06-19 ENCOUNTER — Other Ambulatory Visit: Payer: Self-pay

## 2021-06-23 LAB — CBC WITH DIFFERENTIAL/PLATELET
Absolute Monocytes: 356 cells/uL (ref 200–950)
Basophils Absolute: 22 cells/uL (ref 0–200)
Basophils Relative: 0.6 %
Eosinophils Absolute: 40 cells/uL (ref 15–500)
Eosinophils Relative: 1.1 %
HCT: 36.6 % (ref 35.0–45.0)
Hemoglobin: 12.3 g/dL (ref 11.7–15.5)
Lymphs Abs: 1339 cells/uL (ref 850–3900)
MCH: 31.8 pg (ref 27.0–33.0)
MCHC: 33.6 g/dL (ref 32.0–36.0)
MCV: 94.6 fL (ref 80.0–100.0)
MPV: 10.2 fL (ref 7.5–12.5)
Monocytes Relative: 9.9 %
Neutro Abs: 1843 cells/uL (ref 1500–7800)
Neutrophils Relative %: 51.2 %
Platelets: 273 10*3/uL (ref 140–400)
RBC: 3.87 10*6/uL (ref 3.80–5.10)
RDW: 12.3 % (ref 11.0–15.0)
Total Lymphocyte: 37.2 %
WBC: 3.6 10*3/uL — ABNORMAL LOW (ref 3.8–10.8)

## 2021-06-23 LAB — COMPLETE METABOLIC PANEL WITH GFR
AG Ratio: 1.4 (calc) (ref 1.0–2.5)
ALT: 8 U/L (ref 6–29)
AST: 11 U/L (ref 10–30)
Albumin: 3.7 g/dL (ref 3.6–5.1)
Alkaline phosphatase (APISO): 46 U/L (ref 31–125)
BUN: 9 mg/dL (ref 7–25)
CO2: 27 mmol/L (ref 20–32)
Calcium: 8.8 mg/dL (ref 8.6–10.2)
Chloride: 106 mmol/L (ref 98–110)
Creat: 0.64 mg/dL (ref 0.50–0.99)
Globulin: 2.7 g/dL (calc) (ref 1.9–3.7)
Glucose, Bld: 82 mg/dL (ref 65–99)
Potassium: 3.9 mmol/L (ref 3.5–5.3)
Sodium: 139 mmol/L (ref 135–146)
Total Bilirubin: 0.6 mg/dL (ref 0.2–1.2)
Total Protein: 6.4 g/dL (ref 6.1–8.1)
eGFR: 113 mL/min/{1.73_m2} (ref >=60)

## 2021-06-23 LAB — VITAMIN D 1,25 DIHYDROXY
Vitamin D 1, 25 (OH)2 Total: 54 pg/mL (ref 18–72)
Vitamin D2 1, 25 (OH)2: 12 pg/mL
Vitamin D3 1, 25 (OH)2: 42 pg/mL

## 2021-06-23 LAB — LIPID PANEL
Cholesterol: 181 mg/dL (ref <200)
HDL: 66 mg/dL (ref >=50)
LDL Cholesterol (Calc): 100 mg/dL (calc) — ABNORMAL HIGH
Non-HDL Cholesterol (Calc): 115 mg/dL (calc) (ref <130)
Total CHOL/HDL Ratio: 2.7 (calc) (ref <5.0)
Triglycerides: 67 mg/dL (ref <150)

## 2021-06-23 LAB — TSH: TSH: 1.37 mIU/L

## 2021-06-24 ENCOUNTER — Encounter: Payer: Self-pay | Admitting: Family

## 2021-06-24 ENCOUNTER — Encounter: Payer: 59 | Admitting: Family

## 2021-06-25 NOTE — Progress Notes (Signed)
  This encounter was created in error - please disregard. No show 

## 2021-06-30 ENCOUNTER — Ambulatory Visit
Admission: RE | Admit: 2021-06-30 | Discharge: 2021-06-30 | Disposition: A | Discharge status: Home / Self Care | Payer: 59 | Source: Ambulatory Visit | Attending: Family Medicine | Admitting: Family Medicine

## 2021-06-30 ENCOUNTER — Other Ambulatory Visit: Payer: Self-pay

## 2021-06-30 VITALS — BP 143/92 | HR 64 | Temp 97.6°F | Resp 18

## 2021-06-30 DIAGNOSIS — M722 Plantar fascial fibromatosis: Secondary | ICD-10-CM

## 2021-06-30 MED ORDER — MELOXICAM 15 MG PO TABS: 7.5000 mg | ORAL_TABLET | Freq: Two times a day (BID) | ORAL | 0 refills | Status: DC | PRN

## 2021-06-30 MED ORDER — DEXAMETHASONE SODIUM PHOSPHATE 10 MG/ML IJ SOLN
10.0000 mg | Freq: Once | INTRAMUSCULAR | Status: AC
  Administered 2021-06-30: 10 mg via INTRAMUSCULAR

## 2021-06-30 MED ORDER — KETOROLAC TROMETHAMINE 60 MG/2ML IM SOLN
60.0000 mg | Freq: Once | INTRAMUSCULAR | Status: AC
  Administered 2021-06-30: 60 mg via INTRAMUSCULAR

## 2021-06-30 NOTE — ED Triage Notes (Signed)
Bilateral feet pain and swelling since Friday.

## 2021-06-30 NOTE — ED Provider Notes (Signed)
[facility]    CSN: [id number] Arrival date & time: 06/30/21  0920      History   Chief Complaint No chief complaint on file.   HPI Stacey Palmer is a 40 y.o. female.   HPI Patient presents today for evaluation of acute flare of [place] fasciitis involving both feet.  Patient is in [place] and is constantly on her feet for 12 hours a day which she feels is exacerbating symptoms.  She has been followed by podiatrist who has prescribed her orthotics and perform direct steroid injections in the foot which temporarily alleviate symptoms.  She is out of meloxicam which she was prescribed previously for pain and felt that that helped.  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   Vaginal irritation 09/18/2020   Hip pain, right 09/16/2020   Snoring 09/16/2020   Labial skin tag 09/13/2020   Fatigue 12/06/2019   Allergies 12/06/2019   Hair loss 12/06/2019   GAD (generalized anxiety disorder) 12/26/2018   Gastroesophageal reflux disease 06/04/2017   Vitamin D deficiency 10/21/2016   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   Obesity (BMI 30-39.9) 06/11/2015   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 Authorizing Provider  meloxicam (MOBIC) 15 MG tablet Take 0.5 tablets (7.5 mg total) by mouth 2 (two) times daily as needed for pain. 06/30/21  Yes Bing Neighbors, FNP  benzonatate (TESSALON) 100 MG capsule Take 1 capsule by mouth every 8 (eight) hours for cough. 05/28/21   Mardella Layman, MD  Cholecalciferol (VITAMIN D3 PO) Take 1 capsule by mouth once a week.    [provider]  Ferrous Sulfate (IRON PO) Take 1 capsule by mouth daily.    [provider]  fluticasone (FLONASE) 50 MCG/ACT nasal spray Place 2 sprays into both nostrils daily. 07/11/16   Eustace Moore, MD  levocetirizine (XYZAL) 5 MG tablet Take 1 tablet (5 mg total) by mouth every evening. 12/06/19   Freddy Finner, NP  methylPREDNISolone (MEDROL DOSEPAK) 4 MG TBPK tablet 6  day dose pack - take as directed 02/18/21   Edwin Cap, DPM  ondansetron (ZOFRAN-ODT) 4 MG disintegrating tablet Take 1 tablet (4 mg total) by mouth every 8 (eight) hours as needed for nausea or vomiting. 05/28/21   Mardella Layman, MD  pantoprazole (PROTONIX) 40 MG tablet Take 1 tablet (40 mg total) by mouth daily. 04/16/20   Kerri Perches, MD  prednisoLONE acetate (PRED FORTE) 1 % ophthalmic suspension SMARTSIG:In Eye(s) 01/26/21   [provider]  venlafaxine XR (EFFEXOR-XR) 75 MG 24 hr capsule Take 1 capsule (75 mg total) by mouth daily with breakfast. Dose increase effective 03/24/2019 09/16/19   Kerri Perches, MD  Vitamin D, Ergocalciferol, (DRISDOL) 1.25 MG (50000 UNIT) CAPS capsule TAKE 1 CAPSULE BY MOUTH ONCE A WEEK AS DIRECTED 03/18/21   Kerri Perches, MD    Family History Family History  Problem Relation Age of Onset   Hypertension Mother     Kidney disease Father    Diabetes Father    Breast cancer Paternal Aunt    Dementia Paternal Grandmother    Asthma Daughter     Social History Social History   Tobacco Use   Smoking status: Never   Smokeless tobacco: Never  Vaping Use   Vaping Use: Never used  Substance Use Topics   Alcohol use: No   Drug use: No     Allergies   Patient has no known allergies.   Review of Systems Review of Systems Pertinent negatives listed in HPI  Physical Exam Triage Vital Signs ED Triage Vitals [06/30/21 0946]  Enc Vitals Group     BP (!) 143/92     Pulse Rate 64     Resp 18     Temp 97.6 F (36.4 C)     Temp Source Temporal     SpO2 99 %     Weight      Height      Head Circumference      Peak Flow      Pain Score 8     Pain Loc      Pain Edu?      Excl. in GC?    No data found.  Updated Vital Signs BP (!) 143/92 (BP Location: Right Arm)   Pulse 64   Temp 97.6 F (36.4 C) (Temporal)   Resp 18   LMP 06/02/2021   SpO2 99%   Visual Acuity Right Eye Distance:   Left Eye Distance:   Bilateral Distance:    Right Eye Near:   Left Eye Near:    Bilateral Near:     Physical Exam Constitutional:      Appearance: Normal appearance.  Cardiovascular:     Rate and Rhythm: Normal rate and regular rhythm.     Pulses:          Dorsalis pedis pulses are 2+ on the right side and 2+ on the left side.  Pulmonary:     Effort: Pulmonary effort is normal.     Breath sounds: Normal breath sounds.  Musculoskeletal:       Feet:  Skin:    Capillary Refill: Capillary refill takes less than 2 seconds.  Neurological:     General: No focal deficit present.     Mental Status: She is alert and oriented to person, place, and time.  Psychiatric:        Mood and Affect: Mood normal.        Behavior: Behavior normal.  Thought Content: Thought content normal.        Judgment: Judgment normal.     UC Treatments / Results  Labs (all labs ordered are listed, but only  abnormal results are displayed) Labs Reviewed - No data to display  EKG   Radiology No results found.  Procedures Procedures (including critical care time)  Medications Ordered in UC Medications  ketorolac (TORADOL) injection 60 mg (60 mg Intramuscular Given 06/30/21 1043)  dexamethasone (DECADRON) injection 10 mg (10 mg Intramuscular Given 06/30/21 1044)    Initial Impression / Assessment and Plan / UC Course  I have reviewed the triage vital signs and the nursing notes.  Pertinent labs & imaging results that were available during my care of the patient were reviewed by me and considered in my medical decision making (see chart for details).    Plantar fasciitis Decadron and Toradol IM given here in clinic. Outpatient management with 7.5 mg of meloxicam twice daily as needed for pain Recommended compression socks.  Counseled patient on possibly switching over to a different job role due to the amount of time she has to spend on her feet and having recurrent flares of plantar fasciitis.  In the meantime also educated on change in shoes at the mid shift to help prevent and alleviate foot pain.  Return precautions given. Final Clinical Impressions(s) / UC Diagnoses   Final diagnoses:  Plantar fasciitis   Discharge Instructions   None    ED Prescriptions     Medication Sig Dispense Auth. Provider   meloxicam (MOBIC) 15 MG tablet Take 0.5 tablets (7.5 mg total) by mouth 2 (two) times daily as needed for pain. 60 tablet Bing Neighbors, FNP      PDMP not reviewed this encounter.   Bing Neighbors, FNP 06/30/21 1055

## 2021-07-02 ENCOUNTER — Ambulatory Visit: Payer: 59 | Attending: Podiatry

## 2021-07-07 ENCOUNTER — Telehealth: Payer: 59 | Admitting: Family

## 2021-07-07 DIAGNOSIS — Z20822 Contact with and (suspected) exposure to covid-19: Secondary | ICD-10-CM | POA: Diagnosis not present

## 2021-07-07 MED ORDER — FLUTICASONE PROPIONATE 50 MCG/ACT NA SUSP
2.0000 | Freq: Every day | NASAL | 6 refills | Status: DC
  Filled 2022-02-13: qty 16, 30d supply, fill #0
  Filled 2022-05-29 – 2022-06-23 (×2): qty 16, 30d supply, fill #1

## 2021-07-07 MED ORDER — BENZONATATE 100 MG PO CAPS: 100.0000 mg | ORAL_CAPSULE | Freq: Three times a day (TID) | ORAL | 0 refills | Status: DC | PRN

## 2021-07-07 NOTE — Progress Notes (Signed)
Virtual Visit Consent   Stacey Palmer, you are scheduled for a virtual visit with a [facility] provider today.     Just as with appointments in the office, your consent must be obtained to participate.  Your consent will be active for this visit and any virtual visit you may have with one of our providers in the next 365 days.     If you have a MyChart account, a copy of this consent can be sent to you electronically.  All virtual visits are billed to your insurance company just like a traditional visit in the office.    As this is a virtual visit, video technology does not allow for your provider to perform a traditional examination.  This may limit your provider's ability to fully assess your condition.  If your provider identifies any concerns that need to be evaluated in person or the need to arrange testing (such as labs, EKG, etc.), we will make arrangements to do so.     Although advances in technology are sophisticated, we cannot ensure that it will always work on either your end or our end.  If the connection with a video visit is poor, the visit may have to be switched to a telephone visit.  With either a video or telephone visit, we are not always able to ensure that we have a secure connection.     I need to obtain your verbal consent now.   Are you willing to proceed with your visit today?    Stacey Palmer has provided verbal consent on 07/07/2021 for a virtual visit (video or telephone).   Jannifer Rodney, FNP   Date: 07/07/2021 1:13 PM   Virtual Visit via Video Note   I, Jannifer Rodney, connected with  Stacey Palmer  ([id number], July 30, 1981) on 07/07/21 at 12:45 PM EDT by a video-enabled telemedicine application and verified that I am speaking with the correct person using two identifiers.  Location: Patient: Virtual Visit Location Patient: Home Provider: Virtual Visit Location Provider: Home   I discussed the limitations of evaluation and management by telemedicine and the  availability of in person appointments. The patient expressed understanding and agreed to proceed.    History of Present Illness: Stacey Palmer is a 40 y.o. who identifies as a female who was assigned female at birth, and is being seen today for weakness , fatigue, and swelling in bilateral feet that started Friday. States she has a home COVID test, but has not taken it yet.   HPI: URI  This is a new problem. The current episode started more than 1 year ago. The problem has been gradually worsening. Maximum temperature: chills and feels hot. Associated symptoms include congestion, coughing, headaches, rhinorrhea, sinus pain, sneezing and a sore throat. Associated symptoms comments: fatigue.   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   Skin tag 06/05/2014   Allergic rhinitis 06/05/2014   URI (upper respiratory infection) 08/23/2012  Low back pain 04/04/2010   Morbid obesity (HCC) 02/10/2008    Allergies: No Known Allergies Medications:  Current Outpatient Medications:    benzonatate (TESSALON PERLES) 100 MG capsule, Take 1 capsule (100 mg total) by mouth 3 (three) times daily as needed., Disp: 20 capsule, Rfl: 0   fluticasone (FLONASE) 50 MCG/ACT nasal spray, Place 2 sprays into both nostrils daily., Disp: 16 g, Rfl: 6   Cholecalciferol (VITAMIN D3 PO), Take 1 capsule by mouth once a week., Disp: , Rfl:    Ferrous Sulfate (IRON PO), Take 1  capsule by mouth daily., Disp: , Rfl:    levocetirizine (XYZAL) 5 MG tablet, Take 1 tablet (5 mg total) by mouth every evening., Disp: 30 tablet, Rfl: 1   meloxicam (MOBIC) 15 MG tablet, Take 0.5 tablets (7.5 mg total) by mouth 2 (two) times daily as needed for pain., Disp: 60 tablet, Rfl: 0   methylPREDNISolone (MEDROL DOSEPAK) 4 MG TBPK tablet, 6 day dose pack - take as directed, Disp: 21 tablet, Rfl: 0   ondansetron (ZOFRAN-ODT) 4 MG disintegrating tablet, Take 1 tablet (4 mg total) by mouth every 8 (eight) hours as needed for nausea or vomiting., Disp: 15 tablet, Rfl: 0   pantoprazole (PROTONIX) 40 MG tablet, Take 1 tablet (40 mg total) by mouth daily., Disp: 30 tablet, Rfl: 3   prednisoLONE acetate (PRED FORTE) 1 % ophthalmic suspension, SMARTSIG:In Eye(s), Disp: , Rfl:    venlafaxine XR (EFFEXOR-XR) 75 MG 24 hr capsule, Take 1 capsule (75 mg total) by mouth daily with breakfast. Dose increase effective 03/24/2019, Disp: 30 capsule, Rfl: 3   Vitamin D, Ergocalciferol, (DRISDOL) 1.25 MG (50000 UNIT) CAPS capsule, TAKE 1 CAPSULE BY MOUTH ONCE A WEEK AS DIRECTED, Disp: 4 capsule, Rfl: 4  Observations/Objective: Patient is well-developed, well-nourished in no acute distress.  Resting comfortably  at home.  Head is normocephalic, atraumatic.  No labored breathing.  Speech is clear and coherent with logical content.  Patient is alert and oriented at baseline.  Nasal congestion  Assessment and Plan: 1. Encounter by telehealth for suspected COVID-19 - benzonatate (TESSALON PERLES) 100 MG capsule; Take 1 capsule (100 mg total) by mouth 3 (three) times daily as needed.  Dispense: 20 capsule; Refill: 0 - fluticasone (FLONASE) 50 MCG/ACT nasal spray; Place 2 sprays into both nostrils daily.  Dispense: 16 g; Refill: 6 - Take meds as prescribed - Use a cool mist humidifier  -Use saline nose sprays frequently -Force fluids -For any cough or congestion  Use plain Mucinex- regular strength or max  strength is fine -For fever or aces or pains- take tylenol or ibuprofen. -Throat lozenges if help -Pt will take home COVID test Work note given   Follow Up Instructions: I discussed the assessment and treatment plan with the patient. The patient was provided an opportunity to ask questions and all were answered. The patient agreed with the plan and demonstrated an understanding of the instructions.  A copy of instructions were sent to the patient via MyChart.  The patient was advised to call back or seek an in-person evaluation if the symptoms worsen or if the condition fails to improve as anticipated.  Time:  I spent 12 minutes with the patient via telehealth technology discussing the above problems/concerns.    Jannifer Rodney, FNP

## 2021-07-11 ENCOUNTER — Ambulatory Visit: Payer: 59 | Admitting: Podiatry

## 2021-07-17 ENCOUNTER — Other Ambulatory Visit: Payer: Self-pay

## 2021-07-17 ENCOUNTER — Encounter: Payer: Self-pay | Admitting: Podiatry

## 2021-07-17 ENCOUNTER — Ambulatory Visit (INDEPENDENT_AMBULATORY_CARE_PROVIDER_SITE_OTHER): Payer: 59 | Admitting: Podiatry

## 2021-07-17 DIAGNOSIS — M722 Plantar fascial fibromatosis: Secondary | ICD-10-CM

## 2021-07-17 MED ORDER — TRIAMCINOLONE ACETONIDE 10 MG/ML IJ SUSP
10.0000 mg | Freq: Once | INTRAMUSCULAR | Status: AC
  Administered 2021-07-17: 10 mg

## 2021-07-17 MED ORDER — PREDNISONE 10 MG PO TABS: ORAL_TABLET | ORAL | 0 refills | Status: DC

## 2021-07-18 NOTE — Progress Notes (Signed)
Subjective:   Patient ID: Stacey Palmer, female   DOB: 40 y.o.   MRN: [id number]   HPI Patient presents stating she has had a severe flareup of her Planter fasciitis on her left over right heel.  States she needs to be active and the pain is not allowing her to do what she needs to do   ROS      Objective:  Physical Exam  Neurovascular status intact with patient found to have acute discomfort in the plantar aspect of the left heel over right with inflammation fluid buildup     Assessment:  Acute plantar fasciitis of the heel left with mild in the right     Plan:  H&P reviewed condition and we are going to immobilize to completely allow the plantar area to rest.  I did do a sterile prep and injected the fascia at insertion 3 mg Kenalog 5 mg Xylocaine and applied an air fracture walker with all instructions on usage to reduce all pressure and hopefully avoid surgery

## 2021-07-23 ENCOUNTER — Ambulatory Visit: Payer: 59 | Admitting: Podiatry

## 2021-07-30 ENCOUNTER — Ambulatory Visit: Payer: 59 | Admitting: Podiatry

## 2021-07-31 ENCOUNTER — Telehealth: Payer: Self-pay | Admitting: *Deleted

## 2021-07-31 NOTE — Telephone Encounter (Signed)
Patient is wanting to come to office to make sure she is wearing properly, on feet 8 hours daily and doesn't want it to hurt.  Returned call to patient that she may come in and someone will help her with the boot.

## 2021-08-05 ENCOUNTER — Telehealth: Payer: Self-pay | Admitting: Podiatry

## 2021-08-05 MED ORDER — MELOXICAM 15 MG PO TABS: 7.5000 mg | ORAL_TABLET | Freq: Two times a day (BID) | ORAL | 0 refills | Status: DC | PRN

## 2021-08-05 NOTE — Addendum Note (Signed)
Addended byLilian Kapur,  R on: 08/05/2021 05:03 PM   Modules accepted: Orders

## 2021-08-05 NOTE — Telephone Encounter (Signed)
Pt came in today requesting pain medicine. Please advise

## 2021-08-06 ENCOUNTER — Telehealth: Payer: Self-pay | Admitting: Podiatry

## 2021-08-12 ENCOUNTER — Other Ambulatory Visit: Payer: Self-pay

## 2021-08-12 ENCOUNTER — Ambulatory Visit (INDEPENDENT_AMBULATORY_CARE_PROVIDER_SITE_OTHER): Payer: 59 | Admitting: Podiatry

## 2021-08-12 DIAGNOSIS — M722 Plantar fascial fibromatosis: Secondary | ICD-10-CM

## 2021-08-12 DIAGNOSIS — M79672 Pain in left foot: Secondary | ICD-10-CM

## 2021-08-13 NOTE — Progress Notes (Signed)
  Subjective:  Patient ID: Stacey Palmer, female    DOB: 05/02/1981,  MRN: [id number]  Chief Complaint  Patient presents with   Plantar Fasciitis    2 month f/u PF    40 y.o. female returns with the above complaint. History confirmed with patient.  She was doing quite well for some time but pain has returned she thinks he may need another injection.  She did not start physical therapy  Objective:  Physical Exam: warm, good capillary refill, no trophic changes or ulcerative lesions, normal DP and PT pulses and normal sensory exam.  Palpation to the medial plantar fascial and insertion of the tubercle, she has pes planus and mild hallux valgus deformity bilaterally  Radiographs: X-ray of both feet: no fracture, dislocation, swelling or degenerative changes noted, hallux valgus deformity and pes planus Assessment:   1. Plantar fasciitis      Plan:  Patient was evaluated and treated and all questions answered.  Discussed the etiology and treatment options for plantar fasciitis including stretching, formal physical therapy, supportive shoegears such as a running shoe or sneaker, pre fabricated orthoses, injection therapy, and oral medications. We also discussed the role of surgical treatment of this for patients who do not improve after exhausting non-surgical treatment options.   Plantar Fasciitis -So far she has been dealing with this for over 6 months.  I think we have exhausted all conservative treatment including injections physical therapy and CAM boot immobilization.  At this point I recommend an MRI to evaluate the plantar fascia and I think we should consider surgical intervention.  She will see me back after the MRI for planning for this.  Return for after MRI .

## 2021-08-27 ENCOUNTER — Telehealth: Payer: Self-pay | Admitting: *Deleted

## 2021-08-27 NOTE — Telephone Encounter (Signed)
Patient is calling because she did not wear her boot this morning and wanted to know if she should continue to wear. MRI is scheduled for 08/29/21. Returned the call to patient and explained that she should wear boot until next appointment and to call back to schedule her f/u after the MRI, verbalized understanding.

## 2021-08-28 ENCOUNTER — Other Ambulatory Visit: Payer: Self-pay | Admitting: Family Medicine

## 2021-08-29 ENCOUNTER — Other Ambulatory Visit: Payer: Self-pay

## 2021-08-29 ENCOUNTER — Ambulatory Visit
Admission: RE | Admit: 2021-08-29 | Discharge: 2021-08-29 | Disposition: A | Discharge status: Home / Self Care | Payer: 59 | Source: Ambulatory Visit | Attending: Podiatry | Admitting: Podiatry

## 2021-08-29 DIAGNOSIS — M79672 Pain in left foot: Secondary | ICD-10-CM

## 2021-08-29 DIAGNOSIS — M722 Plantar fascial fibromatosis: Secondary | ICD-10-CM

## 2021-09-04 ENCOUNTER — Other Ambulatory Visit: Payer: 59 | Admitting: Adult Health

## 2021-09-05 ENCOUNTER — Ambulatory Visit (INDEPENDENT_AMBULATORY_CARE_PROVIDER_SITE_OTHER): Payer: 59 | Admitting: Podiatry

## 2021-09-05 ENCOUNTER — Other Ambulatory Visit: Payer: Self-pay

## 2021-09-05 DIAGNOSIS — M722 Plantar fascial fibromatosis: Secondary | ICD-10-CM

## 2021-09-09 NOTE — Progress Notes (Signed)
  Subjective:  Patient ID: Stacey Palmer, female    DOB: 03/10/1981,  MRN: [id number]  Chief Complaint  Patient presents with   Plantar Fasciitis    mri follow up    40 y.o. female returns with the above complaint. History confirmed with patient.   She did not start physical therapy  Objective:  Physical Exam: warm, good capillary refill, no trophic changes or ulcerative lesions, normal DP and PT pulses and normal sensory exam.  Palpation to the medial plantar fascial and insertion of the tubercle, she has pes planus and mild hallux valgus deformity bilaterally  Radiographs: X-ray of both feet: no fracture, dislocation, swelling or degenerative changes noted, hallux valgus deformity and pes planus  Study Result  Narrative & Impression  CLINICAL DATA:  Heel pain and burning since March 2022.   EXAM: MR OF THE LEFT HEEL WITHOUT CONTRAST   TECHNIQUE: Multiplanar, multisequence MR imaging of the right heel was performed. No intravenous contrast was administered.   COMPARISON:  None.   FINDINGS: TENDONS   Peroneal: Mild tendinosis and tenosynovitis of the peroneus longus. Peroneal brevis intact.   Posteromedial: Posterior tibial tendon intact. Flexor hallucis longus tendon intact. Flexor digitorum longus tendon intact.   Anterior: Tibialis anterior tendon intact. Extensor hallucis longus tendon intact Extensor digitorum longus tendon intact.   Achilles:  Intact.   Plantar Fascia: Plantar fascia is intact. Mild muscle edema in the abductor hallucis muscle and flexor digitorum brevis muscle concerning for muscle strain.   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 Calcaneofibular ligament intact. Posterior talofibular ligament intact. Anterior and posterior tibiofibular ligaments intact.   Medial: Chronic partial tear of the deltoid ligament. Spring ligament intact.   CARTILAGE   Ankle Joint: No joint effusion. Partial-thickness  cartilage loss of the talofibular joint.   Subtalar Joints/Sinus Tarsi: Normal subtalar joints. No subtalar joint effusion. Normal sinus tarsi.   Bones: No acute fracture or dislocation. Relative pes planus. Small plantar calcaneal spur. Subcortical reactive marrow changes in the medial malleolus adjacent to the deltoid ligament insertion through   Soft Tissue: No fluid collection or hematoma. Mild muscle edema in the abductor hallucis muscle and flexor digitorum brevis muscle concerning for muscle strain. Tarsal tunnel is normal.   IMPRESSION: 1. Mild tendinosis and tenosynovitis of the peroneus longus. 2. Mild muscle edema in the abductor hallucis muscle and flexor digitorum brevis muscle concerning for muscle strain.     Electronically Signed   By: Elige Ko M.D.   On: 09/02/2021 07:17   Assessment:   1. Plantar fasciitis      Plan:  Patient was evaluated and treated and all questions answered.  Discussed the etiology and treatment options for plantar fasciitis including stretching, formal physical therapy, supportive shoegears such as a running shoe or sneaker, pre fabricated orthoses, injection therapy, and oral medications. We also discussed the role of surgical treatment of this for patients who do not improve after exhausting non-surgical treatment options.   Plantar Fasciitis -Reviewed the MRI with her today overall seems to be encouraging.  I recommended physical therapy and sent a referral for this.  Also recommend orthotics for the power steps.  She will return to purchase these.  Consider injection next visit if not improving.  Return in about 8 weeks (around 10/31/2021) for recheck plantar fasciitis.

## 2021-09-24 ENCOUNTER — Other Ambulatory Visit: Payer: Self-pay

## 2021-09-24 ENCOUNTER — Ambulatory Visit (INDEPENDENT_AMBULATORY_CARE_PROVIDER_SITE_OTHER): Payer: 59 | Admitting: Podiatry

## 2021-09-24 DIAGNOSIS — M722 Plantar fascial fibromatosis: Secondary | ICD-10-CM | POA: Diagnosis not present

## 2021-09-24 NOTE — Patient Instructions (Signed)
[facility]. [address] [place],  [place]  [address] Get Driving Directions Main: [phone]  Fax: [phone]

## 2021-09-24 NOTE — Telephone Encounter (Signed)
Error message

## 2021-09-25 NOTE — Progress Notes (Signed)
  Subjective:  Patient ID: Stacey Palmer, female    DOB: 02/04/1981,  MRN: [id number]  Chief Complaint  Patient presents with   Plantar Fasciitis    Pt is requesting an injection - left foot     40 y.o. female returns with the above complaint. History confirmed with patient.   She did not start physical therapy  Objective:  Physical Exam: warm, good capillary refill, no trophic changes or ulcerative lesions, normal DP and PT pulses and normal sensory exam.  Palpation to the medial plantar fascial and insertion of the tubercle, she has pes planus and mild hallux valgus deformity bilaterally  Radiographs: X-ray of both feet: no fracture, dislocation, swelling or degenerative changes noted, hallux valgus deformity and pes planus  Study Result  Narrative & Impression  CLINICAL DATA:  Heel pain and burning since March 2022.   EXAM: MR OF THE LEFT HEEL WITHOUT CONTRAST   TECHNIQUE: Multiplanar, multisequence MR imaging of the right heel was performed. No intravenous contrast was administered.   COMPARISON:  None.   FINDINGS: TENDONS   Peroneal: Mild tendinosis and tenosynovitis of the peroneus longus. Peroneal brevis intact.   Posteromedial: Posterior tibial tendon intact. Flexor hallucis longus tendon intact. Flexor digitorum longus tendon intact.   Anterior: Tibialis anterior tendon intact. Extensor hallucis longus tendon intact Extensor digitorum longus tendon intact.   Achilles:  Intact.   Plantar Fascia: Plantar fascia is intact. Mild muscle edema in the abductor hallucis muscle and flexor digitorum brevis muscle concerning for muscle strain.   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 Calcaneofibular ligament intact. Posterior talofibular ligament intact. Anterior and posterior tibiofibular ligaments intact.   Medial: Chronic partial tear of the deltoid ligament. Spring ligament intact.   CARTILAGE   Ankle Joint: No  joint effusion. Partial-thickness cartilage loss of the talofibular joint.   Subtalar Joints/Sinus Tarsi: Normal subtalar joints. No subtalar joint effusion. Normal sinus tarsi.   Bones: No acute fracture or dislocation. Relative pes planus. Small plantar calcaneal spur. Subcortical reactive marrow changes in the medial malleolus adjacent to the deltoid ligament insertion through   Soft Tissue: No fluid collection or hematoma. Mild muscle edema in the abductor hallucis muscle and flexor digitorum brevis muscle concerning for muscle strain. Tarsal tunnel is normal.   IMPRESSION: 1. Mild tendinosis and tenosynovitis of the peroneus longus. 2. Mild muscle edema in the abductor hallucis muscle and flexor digitorum brevis muscle concerning for muscle strain.     Electronically Signed   By: Elige Ko M.D.   On: 09/02/2021 07:17   Assessment:   1. Plantar fasciitis      Plan:  Patient was evaluated and treated and all questions answered.  Discussed the etiology and treatment options for plantar fasciitis including stretching, formal physical therapy, supportive shoegears such as a running shoe or sneaker, pre fabricated orthoses, injection therapy, and oral medications. We also discussed the role of surgical treatment of this for patients who do not improve after exhausting non-surgical treatment options.   Plantar Fasciitis -Repeat injection performed today -Encouraged her to call physical therapy to schedule, last referral note says that they called and left her voicemail today.  I gave her the number for [facility] on [address]  No follow-ups on file.

## 2021-09-30 ENCOUNTER — Ambulatory Visit: Payer: 59 | Admitting: Family

## 2021-10-17 ENCOUNTER — Ambulatory Visit (INDEPENDENT_AMBULATORY_CARE_PROVIDER_SITE_OTHER): Payer: 59 | Admitting: Adult Health

## 2021-10-17 ENCOUNTER — Encounter: Payer: Self-pay | Admitting: Adult Health

## 2021-10-17 ENCOUNTER — Other Ambulatory Visit: Payer: Self-pay

## 2021-10-17 VITALS — BP 130/88 | HR 68 | Temp 97.4°F | Resp 16 | Ht 68.0 in

## 2021-10-17 DIAGNOSIS — R6883 Chills (without fever): Secondary | ICD-10-CM

## 2021-10-17 DIAGNOSIS — R509 Fever, unspecified: Secondary | ICD-10-CM | POA: Diagnosis not present

## 2021-10-17 DIAGNOSIS — B9689 Other specified bacterial agents as the cause of diseases classified elsewhere: Secondary | ICD-10-CM

## 2021-10-17 DIAGNOSIS — J019 Acute sinusitis, unspecified: Secondary | ICD-10-CM | POA: Diagnosis not present

## 2021-10-17 DIAGNOSIS — J029 Acute pharyngitis, unspecified: Secondary | ICD-10-CM | POA: Diagnosis not present

## 2021-10-17 DIAGNOSIS — R067 Sneezing: Secondary | ICD-10-CM

## 2021-10-17 LAB — POCT RAPID STREP A (OFFICE): Rapid Strep A Screen: NEGATIVE

## 2021-10-17 LAB — POCT INFLUENZA A/B
Influenza A, POC: NEGATIVE
Influenza B, POC: NEGATIVE

## 2021-10-17 MED ORDER — AZITHROMYCIN 250 MG PO TABS: ORAL_TABLET | ORAL | 0 refills | Status: AC

## 2021-10-17 NOTE — Patient Instructions (Signed)
Please take zpcack as directed.  Use mucinex DM twice daily for 1-2 weeks  Contact [facility] Sunday for returning to work on Monday

## 2021-10-17 NOTE — Progress Notes (Signed)
Location:  [facility]   Place of Service:   clinic    CODE STATUS: full   No Known Allergies  Chief Complaint  Patient presents with   Acute Visit    Patient complains of fever, chills, sore throat, coughing, body aches, headache, and sneezing. Patient symptoms started Saturday 10/12/2021.    HPI:  Since Saturday she has had fevers intermittently; sore throat; headache sinus congestion; cough; headaches; body aches. . She has poor sleep at night due to feeling hot. Appetite is fair is drinking fluids. She has not tried any medications.   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/26/2018   H/O gastric sleeve 2018   History of mammogram 2021   Obesity    Sleep apnea     Past Surgical History:  Procedure Laterality Date   BARIATRIC SURGERY  2018   CESAREAN SECTION     TUBAL LIGATION      Social History   Socioeconomic History   Marital status: Single    Spouse name: Not on file   Number of children: Not on file   Years of education: Not on file   Highest education level: Not on file  Occupational History   Not on file  Tobacco Use   Smoking status: Never   Smokeless tobacco: Never  Vaping Use   Vaping Use: Never used  Substance and Sexual Activity   Alcohol use: No   Drug use: No   Sexual activity: Not Currently    Birth control/protection: Surgical    Comment: tubal  Other Topics Concern   Not on file  Social History Narrative   Tobacco use, amount per day now:   Past tobacco use, amount per day:   How many years did you use tobacco:   Alcohol use (drinks per week): Once in a while.   Diet:   Do you drink/eat things with caffeine:   Marital status:  Single.                                What year were you married?   Do you live in a house, apartment, assisted  living, condo, trailer, etc.?   Is it one or more stories?   How many persons live in your home?   Do you have pets in your home?( please list) Yes Cats   Highest Level of education completed?   Current or past profession:   Do you exercise? Yes                                 Type and how often?   Do you have a living will? No.   Do you have a DNR form? No                                  If not, do you want to discuss one?   Do you have signed POA/HPOA forms? No.                       If so,  please bring to you appointment      Do you have any difficulty bathing or dressing yourself? No.   Do you have any difficulty preparing food or eating? No.   Do you have any difficulty managing your medications? No.   Do you have any difficulty managing your finances? No.   Do you have any difficulty affording your medications? No.   Social Determinants of Health   Financial Resource Strain: Not on file  Food Insecurity: Not on file  Transportation Needs: Not on file  Physical Activity: Not on file  Stress: Not on file  Social Connections: Not on file  Intimate Partner Violence: Not on file   Family History  Problem Relation Age of Onset   Hypertension Mother    Kidney disease Father    Diabetes Father    Breast cancer Paternal Aunt    Dementia Paternal Grandmother    Asthma Daughter       VITAL SIGNS BP 130/88    Pulse 68    Temp (!) 97.4 F (36.3 C)    Resp 16    Ht 5\' 8"  (1.727 m)    SpO2 98%    BMI 39.68 kg/m   Outpatient Encounter Medications as of 10/17/2021  Medication Sig   Cholecalciferol (VITAMIN D3 PO) Take 1 capsule by mouth once a week.   Ferrous Sulfate (IRON PO) Take 1 capsule by mouth daily.   fluticasone (FLONASE) 50 MCG/ACT nasal spray Place 2 sprays into both nostrils daily.   IFEREX 150 150 MG capsule Take 150 mg by mouth 2 (two) times daily.   levocetirizine (XYZAL) 5 MG tablet Take 1 tablet (5 mg total) by mouth every evening.   meloxicam (MOBIC) 15 MG  tablet Take 0.5 tablets (7.5 mg total) by mouth 2 (two) times daily as needed for pain.   methylPREDNISolone (MEDROL DOSEPAK) 4 MG TBPK tablet 6 day dose pack - take as directed   pantoprazole (PROTONIX) 40 MG tablet Take 1 tablet (40 mg total) by mouth daily.   prednisoLONE acetate (PRED FORTE) 1 % ophthalmic suspension SMARTSIG:In Eye(s)   valACYclovir (VALTREX) 500 MG tablet Take 500 mg by mouth daily.   venlafaxine XR (EFFEXOR-XR) 75 MG 24 hr capsule Take 1 capsule (75 mg total) by mouth daily with breakfast. Dose increase effective 03/24/2019   Vitamin D, Ergocalciferol, (DRISDOL) 1.25 MG (50000 UNIT) CAPS capsule TAKE 1 CAPSULE BY MOUTH ONCE A WEEK AS DIRECTED   [DISCONTINUED] benzonatate (TESSALON PERLES) 100 MG capsule Take 1 capsule (100 mg total) by mouth 3 (three) times daily as needed.   [DISCONTINUED] ondansetron (ZOFRAN-ODT) 4 MG disintegrating tablet Take 1 tablet (4 mg total) by mouth every 8 (eight) hours as needed for nausea or vomiting.   [DISCONTINUED] predniSONE (DELTASONE) 10 MG tablet 12 day tapering dose   No facility-administered encounter medications on file as of 10/17/2021.     SIGNIFICANT DIAGNOSTIC EXAMS   Review of Systems  Constitutional:  Positive for fever and malaise/fatigue.  HENT:  Positive for congestion, ear pain and sore throat.   Respiratory:  Positive for cough and sputum production. Negative for shortness of breath.   Cardiovascular:  Negative for chest pain, palpitations and leg swelling.  Gastrointestinal:  Negative for abdominal pain, constipation and heartburn.  Musculoskeletal:  Positive for myalgias. Negative for back pain and joint pain.  Skin: Negative.   Neurological:  Negative for dizziness.  Psychiatric/Behavioral:  The patient is not nervous/anxious.     Physical Exam Constitutional:  General: She is not in acute distress.    Appearance: She is well-developed. She is obese. She is not diaphoretic.  HENT:     Head:      Comments: Bilateral ear canals red and irritated from using Qtips.     Right Ear: Tympanic membrane normal.     Left Ear: Tympanic membrane normal.     Nose: Nose normal.     Mouth/Throat:     Pharynx: Posterior oropharyngeal erythema present.  Eyes:     Conjunctiva/sclera: Conjunctivae normal.  Neck:     Thyroid: No thyromegaly.  Cardiovascular:     Rate and Rhythm: Normal rate and regular rhythm.     Pulses: Normal pulses.     Heart sounds: Normal heart sounds.  Pulmonary:     Effort: Pulmonary effort is normal. No respiratory distress.     Breath sounds: Normal breath sounds.  Abdominal:     General: Bowel sounds are normal. There is no distension.     Palpations: Abdomen is soft.     Tenderness: There is no abdominal tenderness.  Musculoskeletal:        General: Normal range of motion.     Cervical back: Neck supple.     Right lower leg: No edema.     Left lower leg: No edema.  Lymphadenopathy:     Cervical: Cervical adenopathy present.  Skin:    General: Skin is warm and dry.  Neurological:     Mental Status: She is alert and oriented to person, place, and time.  Psychiatric:        Mood and Affect: Mood normal.     ASSESSMENT/ PLAN:  TODAY  Acute bacterial sinusitis: is worse; will begin zpack as directed; mucinex dm twice daily for one to weeks for cough and congestion. Will call [facility] on Sunday about returning to work. Note given to be out on Friday.     Synthia Innocent NP [facility]  Contact [phone] Monday through Friday 8am- 5pm  After hours call [phone]

## 2021-10-18 ENCOUNTER — Ambulatory Visit: Payer: 59 | Admitting: Adult Health

## 2021-10-18 LAB — SARS-COV-2 RNA,(COVID-19) QUALITATIVE NAAT: SARS CoV2 RNA: NOT DETECTED

## 2021-10-19 ENCOUNTER — Ambulatory Visit: Payer: Self-pay

## 2021-10-20 ENCOUNTER — Ambulatory Visit: Payer: 59

## 2021-10-20 ENCOUNTER — Other Ambulatory Visit: Payer: Self-pay

## 2021-10-20 ENCOUNTER — Encounter: Payer: Self-pay | Admitting: *Deleted

## 2021-10-20 ENCOUNTER — Ambulatory Visit
Admission: EM | Admit: 2021-10-20 | Discharge: 2021-10-20 | Disposition: A | Discharge status: Home / Self Care | Payer: 59 | Attending: Family Medicine | Admitting: Family Medicine

## 2021-10-20 DIAGNOSIS — J069 Acute upper respiratory infection, unspecified: Secondary | ICD-10-CM | POA: Diagnosis not present

## 2021-10-20 DIAGNOSIS — R051 Acute cough: Secondary | ICD-10-CM

## 2021-10-20 DIAGNOSIS — R509 Fever, unspecified: Secondary | ICD-10-CM

## 2021-10-20 DIAGNOSIS — R52 Pain, unspecified: Secondary | ICD-10-CM

## 2021-10-20 HISTORY — DX: Plantar fascial fibromatosis: M72.2

## 2021-10-20 MED ORDER — PROMETHAZINE-DM 6.25-15 MG/5ML PO SYRP: 5.0000 mL | ORAL_SOLUTION | Freq: Four times a day (QID) | ORAL | 0 refills | Status: DC | PRN

## 2021-10-20 MED ORDER — OSELTAMIVIR PHOSPHATE 75 MG PO CAPS: 75.0000 mg | ORAL_CAPSULE | Freq: Two times a day (BID) | ORAL | 0 refills | Status: DC

## 2021-10-20 NOTE — ED Triage Notes (Signed)
C/O fatigue, body aches, HA, cough, sore throat, bilat earache, chills, runny nose onset 4 days ago. Today c/o some dizziness.

## 2021-10-20 NOTE — ED Provider Notes (Signed)
[facility]    CSN: [id number] Arrival date & time: 10/20/21  0947      History   Chief Complaint Chief Complaint  Patient presents with   Appointment    1000   Cough   Sore Throat    HPI Stacey Palmer is a 40 y.o. female.   Presenting today with 4-day history of fatigue, significant body aches, headaches, fever, chills, cough, sore throat, bilateral ear pressure and popping, nasal congestion.  Also having some weakness and dizziness today.  Denies chest pain, shortness of breath, abdominal pains, nausea vomiting or diarrhea.  Taking over-the-counter cold and congestion medications, fever reducers with mild temporary relief.  No known sick contacts recently.  No known pertinent chronic medical problems though states she did have an asthma-like reaction with COVID.   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   Vaginal irritation 09/18/2020   Hip pain, right 09/16/2020   Snoring 09/16/2020   Labial skin tag 09/13/2020   Fatigue 12/06/2019   Allergies 12/06/2019   Hair loss 12/06/2019   GAD (generalized anxiety disorder) 12/26/2018   Gastroesophageal reflux disease 06/04/2017   Vitamin D deficiency 10/21/2016   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   Obesity (BMI 30-39.9) 06/11/2015   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 Authorizing Provider  Cholecalciferol (VITAMIN D3 PO) Take 1 capsule by mouth once a week.   Yes [provider]  Ferrous Sulfate (IRON PO) Take 1 capsule by mouth daily.   Yes [provider]  fluticasone (FLONASE) 50 MCG/ACT nasal spray Place 2 sprays into both nostrils daily. 07/07/21  Yes Hawks, Christy A, FNP  levocetirizine (XYZAL) 5 MG tablet Take 1 tablet (5 mg total) by mouth every evening. 12/06/19  Yes Freddy Finner, NP  meloxicam (MOBIC) 15 MG tablet Take 0.5 tablets (7.5 mg total) by mouth 2 (two) times daily as needed for pain. 08/05/21  Yes McDonald, Rachelle Hora, DPM  oseltamivir (TAMIFLU) 75 MG capsule Take 1 capsule (75 mg total) by mouth every 12 (  twelve) hours. 10/20/21  Yes Volney American, PA-C  pantoprazole (PROTONIX) 40 MG tablet Take 1 tablet (40 mg total) by mouth daily. 04/16/20  Yes Fayrene Helper, MD  promethazine-dextromethorphan (PROMETHAZINE-DM) 6.25-15 MG/5ML syrup Take 5 mLs by mouth 4 (four) times daily as needed. 10/20/21  Yes Volney American, PA-C  Vitamin D, Ergocalciferol, (DRISDOL) 1.25 MG (50000 UNIT) CAPS capsule TAKE 1 CAPSULE BY MOUTH ONCE A WEEK AS DIRECTED 03/18/21  Yes Fayrene Helper, MD  azithromycin (ZITHROMAX) 250 MG tablet Take 2 tablets on day 1, then 1 tablet daily on days 2 through 5 10/17/21 10/22/21  Gerlene Fee, NP  IFEREX 150 150 MG capsule Take 150 mg by mouth 2 (two) times daily. 07/12/21   [provider]  methylPREDNISolone (MEDROL DOSEPAK) 4 MG TBPK tablet 6 day dose pack - take as directed 02/18/21   Criselda Peaches,  DPM  prednisoLONE acetate (PRED FORTE) 1 % ophthalmic suspension SMARTSIG:In Eye(s) 01/26/21   [provider]  valACYclovir (VALTREX) 500 MG tablet Take 500 mg by mouth daily. 05/22/21   [provider]  venlafaxine XR (EFFEXOR-XR) 75 MG 24 hr capsule Take 1 capsule (75 mg total) by mouth daily with breakfast. Dose increase effective 03/24/2019 09/16/19   Fayrene Helper, MD    Family History Family History  Problem Relation Age of Onset   Hypertension Mother    Kidney disease Father    Diabetes Father    Breast cancer Paternal Aunt    Dementia Paternal Grandmother    Asthma Daughter     Social History Social History   Tobacco Use   Smoking status: Never   Smokeless tobacco: Never  Vaping Use   Vaping Use: Never used  Substance Use Topics   Alcohol use: No   Drug use: No     Allergies   Patient has no known allergies.   Review of Systems Review of Systems Per HPI  Physical Exam Triage Vital Signs ED Triage Vitals  Enc Vitals Group     BP 10/20/21 1035 139/73     Pulse Rate 10/20/21 1035 (!) 58     Resp 10/20/21 1035 20     Temp 10/20/21 1035 98.2 F (36.8 C)     Temp Source 10/20/21 1035 Oral     SpO2 10/20/21 1035 96 %     Weight --      Height --      Head Circumference --      Peak Flow --      Pain Score 10/20/21 1038 8     Pain Loc --      Pain Edu? --      Excl. in [place]? --    No data found.  Updated Vital Signs BP 139/73    Pulse (!) 58    Temp 98.2 F (36.8 C) (Oral)    Resp 20    LMP 10/13/2021 (Approximate)    SpO2 96%   Visual Acuity Right Eye Distance:   Left Eye Distance:   Bilateral Distance:    Right Eye Near:   Left Eye Near:    Bilateral Near:     Physical Exam Vitals and nursing note reviewed.  Constitutional:      Appearance: Normal appearance.  HENT:     Head: Atraumatic.     Right Ear: Tympanic membrane and external ear normal.     Left Ear: Tympanic membrane and external ear normal.     Nose:  Rhinorrhea present.     Mouth/Throat:     Mouth: Mucous membranes are moist.     Pharynx: Posterior oropharyngeal erythema present.  Eyes:     Extraocular Movements: Extraocular movements intact.     Conjunctiva/sclera: Conjunctivae normal.  Cardiovascular:     Rate and Rhythm: Normal rate and regular rhythm.     Heart sounds: Normal heart sounds.  Pulmonary:     Effort: Pulmonary effort is normal.     Breath sounds: Normal breath sounds. No wheezing or rales.  Musculoskeletal:        General: Normal range of motion.     Cervical back: Normal range of motion and neck supple.  Skin:    General: Skin is warm and dry.  Neurological:     Mental Status: She is alert and oriented to person, place, and time.  Psychiatric:        Mood and Affect: Mood normal.        Thought Content: Thought content normal.     UC Treatments / Results  Labs (all labs ordered are listed, but only abnormal results are displayed) Labs Reviewed  COVID-19, FLU A+B NAA    EKG   Radiology No results found.  Procedures Procedures (including critical care time)  Medications Ordered in UC Medications - No data to display  Initial Impression / Assessment and Plan / UC Course  I have reviewed the triage vital signs and the nursing notes.  Pertinent labs & imaging results that were available during my care of the patient were reviewed by me and considered in my medical decision making (see chart for details).     Vital signs and exam overall reassuring indicative of a viral upper respiratory infection.  Her COVID testing through work was negative, COVID and flu testing today pending.  Suspect influenza, will treat proactively with Tamiflu while awaiting these test results.  Phenergan DM sent to take in addition to her over-the-counter cold and congestion medications, fever reducers.  Supportive home care and return precautions reviewed.  Work note given.  Final Clinical Impressions(s) / UC Diagnoses    Final diagnoses:  Acute cough  Viral URI with cough  Fever, unspecified  Generalized body aches   Discharge Instructions   None    ED Prescriptions     Medication Sig Dispense Auth. Provider   oseltamivir (TAMIFLU) 75 MG capsule Take 1 capsule (75 mg total) by mouth every 12 (twelve) hours. 10 capsule Volney American, [place]   promethazine-dextromethorphan (PROMETHAZINE-DM) 6.25-15 MG/5ML syrup Take 5 mLs by mouth 4 (four) times daily as needed. 100 mL Volney American, [place]      PDMP not reviewed this encounter.   Volney American, [place] 10/20/21 1058

## 2021-10-21 ENCOUNTER — Ambulatory Visit: Payer: 59 | Attending: Podiatry | Admitting: Rehabilitative and Restorative Service Providers"

## 2021-10-21 LAB — COVID-19, FLU A+B NAA
Influenza A, NAA: NOT DETECTED
Influenza B, NAA: NOT DETECTED
SARS-CoV-2, NAA: NOT DETECTED

## 2021-10-22 ENCOUNTER — Other Ambulatory Visit: Payer: Self-pay

## 2021-10-22 ENCOUNTER — Telehealth: Payer: Self-pay

## 2021-10-22 ENCOUNTER — Telehealth (INDEPENDENT_AMBULATORY_CARE_PROVIDER_SITE_OTHER): Payer: 59 | Admitting: Nurse Practitioner

## 2021-10-22 DIAGNOSIS — J069 Acute upper respiratory infection, unspecified: Secondary | ICD-10-CM | POA: Diagnosis not present

## 2021-10-22 NOTE — Progress Notes (Signed)
This service is provided via telemedicine  No vital signs collected/recorded due to the encounter was a telemedicine visit.   Location of patient (ex: home, work):  Home  Patient consents to a telephone visit:  Yes, see encounter dated 10/22/2021  Location of the provider (ex: office, home):  [facility]  Name of any referring provider:  Ngetich, [name], NP  Names of all persons participating in the telemedicine service and their role in the encounter:  Abbey Chatters, Nurse Practitioner, Elveria Royals, CMA, and patient.   Time spent on call:  12 minutes with medical assistant

## 2021-10-22 NOTE — Progress Notes (Signed)
Careteam: Patient Care Team: Ngetich, Donalee Citrin, NP as PCP - General (Family Medicine) Beryle Beams, MD as Consulting Physician (Neurology)  Advanced Directive information    No Known Allergies  Chief Complaint  Patient presents with   Acute Visit    Patient complains of not feeling well. She tested positive for COVID. Patient having headaches.      HPI: Patient is a 40 y.o. female seen today virtually.  She has taken 2 COVID test through our office and ED both were negative.  Flu was negative. She was tamiflu and has been taking but feels worse.  She was also prescribed zpack which she continues to take.  Not feeling any better.  Congestion, cough, runny nose, sneezing and headache. Symptoms for 6 days, became worse over the weekend.  Dry eyes Increase fatigue Sinus congestion with ear pain.  She does not have fever at this time but was hot last night.  Really thirsty but not drinking enough fluids. Reports she is hungry but not eating a lot.  She has been prescribed zpack.    Review of Systems:  Review of Systems  Constitutional:  Positive for chills and malaise/fatigue. Negative for fever and weight loss.  HENT:  Positive for congestion, ear pain and sore throat. Negative for tinnitus.   Respiratory:  Positive for cough and sputum production. Negative for shortness of breath.   Cardiovascular:  Negative for chest pain, palpitations and leg swelling.  Gastrointestinal:  Negative for abdominal pain, constipation, diarrhea and heartburn.  Genitourinary:  Negative for dysuria, frequency and urgency.  Musculoskeletal:  Negative for back pain, falls, joint pain and myalgias.  Skin: Negative.   Neurological:  Negative for dizziness and headaches.  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 She has never used smokeless tobacco. She reports that she does not drink alcohol and does not use drugs.  Family History  Problem Relation Age of Onset   Hypertension Mother    Kidney disease Father    Diabetes Father    Breast cancer Paternal Aunt    Dementia Paternal Grandmother    Asthma Daughter     Medications: Patient's Medications  New Prescriptions   No medications on file  Previous Medications   AZITHROMYCIN (ZITHROMAX) 250 MG TABLET    Take 2 tablets on day 1, then 1 tablet daily on days 2 through 5   CHOLECALCIFEROL (VITAMIN D3 PO)    Take 1 capsule by mouth once a week.   FERROUS SULFATE (IRON PO)    Take 1 capsule by mouth daily.   FLUTICASONE (FLONASE) 50 MCG/ACT NASAL SPRAY    Place 2 sprays into both nostrils daily.   IFEREX 150 150 MG CAPSULE    Take 150 mg by mouth 2 (two) times daily.   LEVOCETIRIZINE (XYZAL) 5 MG TABLET    Take 1 tablet (5 mg total) by mouth every  evening.   MELOXICAM (MOBIC) 15 MG TABLET    Take 0.5 tablets (7.5 mg total) by mouth 2 (two) times daily as needed for pain.   METHYLPREDNISOLONE (MEDROL DOSEPAK) 4 MG TBPK TABLET    6 day dose pack - take as directed   OSELTAMIVIR (TAMIFLU) 75 MG CAPSULE    Take 1 capsule (75 mg total) by mouth every 12 (twelve) hours.   PANTOPRAZOLE (PROTONIX) 40 MG TABLET    Take 1 tablet (40 mg total) by mouth daily.   PREDNISOLONE ACETATE (PRED FORTE) 1 % OPHTHALMIC SUSPENSION    SMARTSIG:In Eye(s)   PROMETHAZINE-DEXTROMETHORPHAN (PROMETHAZINE-DM) 6.25-15 MG/5ML SYRUP    Take 5 mLs by mouth 4 (four) times daily as needed.   VALACYCLOVIR (VALTREX) 500 MG TABLET    Take 500 mg by mouth daily.   VENLAFAXINE XR (EFFEXOR-XR) 75  MG 24 HR CAPSULE    Take 1 capsule (75 mg total) by mouth daily with breakfast. Dose increase effective 03/24/2019   VITAMIN D, ERGOCALCIFEROL, (DRISDOL) 1.25 MG (50000 UNIT) CAPS CAPSULE    TAKE 1 CAPSULE BY MOUTH ONCE A WEEK AS DIRECTED  Modified Medications   No medications on file  Discontinued Medications   No medications on file    Physical Exam:  There were no vitals filed for this visit. There is no height or weight on file to calculate BMI. Wt Readings from Last 3 Encounters:  04/08/21 261 lb (118.4 kg)  01/31/21 257 lb 9.6 oz (116.8 kg)  12/19/20 250 lb (113.4 kg)    Physical Exam  Labs reviewed: Basic Metabolic Panel: Recent Labs    12/19/20 0919 02/06/21 0845 06/19/21 0827  NA 141 138 139  K 4.3 4.2 3.9  CL 104 102 106  CO2 24 28 27   GLUCOSE 80 81 82  BUN 10 13 9   CREATININE 0.73 0.74 0.64  CALCIUM 9.4 9.1 8.8  TSH  --  1.72 1.37   Liver Function Tests: Recent Labs    02/06/21 0845 06/19/21 0827  AST 12 11  ALT 9 8  BILITOT 0.6 0.6  PROT 6.8 6.4   No results for input(s): LIPASE, AMYLASE in the last 8760 hours. No results for input(s): AMMONIA in the last 8760 hours. CBC: Recent Labs    12/19/20 0919 02/06/21 0845 06/19/21 0827  WBC 4.2 3.8 3.6*  NEUTROABS 1.7 1,782 1,843  HGB 12.6 12.9 12.3  HCT 37.7 38.6 36.6  MCV 93 95.1 94.6  PLT 280 306 273   Lipid Panel: Recent Labs    02/06/21 0845 06/19/21 0827  CHOL 196 181  HDL 61 66  LDLCALC 116* 100*  TRIG 86 67  CHOLHDL 3.2 2.7   TSH: Recent Labs    02/06/21 0845 06/19/21 0827  TSH 1.72 1.37   A1C: Lab Results  Component Value Date   HGBA1C 4.9 06/04/2017     Assessment/Plan 1. Upper respiratory tract infection, unspecified type -2 covid test and flu negative.  -continues on zpack for sinusitis previously prescribed -continue supportive care -Netipot or saline wash daily Plain nasal saline spray throughout the day as needed May use tylenol 500 mg 2 tablets every 8  hours as needed aches and pains or sore throat humidifier in the home to help with the dry air Mucinex DM by mouth twice daily as needed for cough and chest congestion with full glass of water  Keep well hydrated Proper nutrition  Avoid forcefully blowing nose Vit c 1000 mg daily Vit d 2000 units  daily  Zinc 50 mg daily  -return precautions discussed  K. Biagio Borg  [facility] [phone]    Virtual Visit via video, mychart  I connected with patient on 10/22/21 at  3:15 PM EST by video and verified that I am speaking with the correct person using two identifiers.  Location: Patient: home Provider: [facility]    I discussed the limitations, risks, security and privacy concerns of performing an evaluation and management service by telephone and the availability of in person appointments. I also discussed with the patient that there may be a patient responsible charge related to this service. The patient expressed understanding and agreed to proceed.   I discussed the assessment and treatment plan with the patient. The patient was provided an opportunity to ask questions and all were answered. The patient agreed with the plan and demonstrated an understanding of the instructions.   The patient was advised to call back or seek an in-person evaluation if the symptoms worsen or if the condition fails to improve as anticipated.  I provided 15 minutes of non-face-to-face time during this encounter.  Janene Harvey. Biagio Borg [facility] printed and mailed

## 2021-10-22 NOTE — Telephone Encounter (Signed)
[name], freedman are scheduled for a virtual visit with your provider today.    Just as we do with appointments in the office, we must obtain your consent to participate.  Your consent will be active for this visit and any virtual visit you may have with one of our providers in the next 365 days.    If you have a MyChart account, I can also send a copy of this consent to you electronically.  All virtual visits are billed to your insurance company just like a traditional visit in the office.  As this is a virtual visit, video technology does not allow for your provider to perform a traditional examination.  This may limit your provider's ability to fully assess your condition.  If your provider identifies any concerns that need to be evaluated in person or the need to arrange testing such as labs, EKG, etc, we will make arrangements to do so.    Although advances in technology are sophisticated, we cannot ensure that it will always work on either your end or our end.  If the connection with a video visit is poor, we may have to switch to a telephone visit.  With either a video or telephone visit, we are not always able to ensure that we have a secure connection.   I need to obtain your verbal consent now.   Are you willing to proceed with your visit today?   Stacey Palmer has provided verbal consent on 10/22/2021 for a virtual visit (video or telephone).   Stacey Palmer, [facility] 10/22/2021  3:26 PM

## 2021-10-22 NOTE — Patient Instructions (Signed)
Netipot or saline wash daily ?Plain nasal saline spray throughout the day as needed ?May use tylenol 500 mg 2 tablets every 8 hours as needed aches and pains or sore throat ?humidifier in the home to help with the dry air ?Mucinex DM by mouth twice daily as needed for cough and chest congestion with full glass of water  ?Keep well hydrated ?Proper nutrition  ?Avoid forcefully blowing nose ?Vit c 1000 mg daily ?Vit d 2000 units daily  ?Zinc 50 mg daily  ? ?

## 2021-10-29 ENCOUNTER — Other Ambulatory Visit: Payer: Self-pay | Admitting: Podiatry

## 2021-10-29 NOTE — Telephone Encounter (Signed)
Please advise 

## 2021-10-30 ENCOUNTER — Ambulatory Visit (INDEPENDENT_AMBULATORY_CARE_PROVIDER_SITE_OTHER): Payer: 59 | Admitting: Family

## 2021-10-30 ENCOUNTER — Other Ambulatory Visit: Payer: Self-pay

## 2021-10-30 ENCOUNTER — Encounter: Payer: Self-pay | Admitting: Family

## 2021-10-30 VITALS — BP 126/84 | HR 75 | Temp 98.1°F | Resp 16 | Ht 68.0 in

## 2021-10-30 DIAGNOSIS — H04129 Dry eye syndrome of unspecified lacrimal gland: Secondary | ICD-10-CM

## 2021-10-30 DIAGNOSIS — F331 Major depressive disorder, recurrent, moderate: Secondary | ICD-10-CM

## 2021-10-30 DIAGNOSIS — R058 Other specified cough: Secondary | ICD-10-CM | POA: Diagnosis not present

## 2021-10-30 DIAGNOSIS — R509 Fever, unspecified: Secondary | ICD-10-CM

## 2021-10-30 DIAGNOSIS — J029 Acute pharyngitis, unspecified: Secondary | ICD-10-CM

## 2021-10-30 DIAGNOSIS — J069 Acute upper respiratory infection, unspecified: Secondary | ICD-10-CM

## 2021-10-30 MED ORDER — ARTIFICIAL TEARS OPHTHALMIC OINT: TOPICAL_OINTMENT | Freq: Three times a day (TID) | OPHTHALMIC | 5 refills | Status: AC

## 2021-10-30 NOTE — Progress Notes (Signed)
Provider: Marlowe Sax FNP-C  , Nelda Bucks, NP  Patient Care Team: , Nelda Bucks, NP as PCP - General (Family Medicine) Phillips Odor, MD as Consulting Physician (Neurology)  Extended Emergency Contact Information Primary Emergency Contact: Mcphee,Gaynelle Address: [place] [address] [place] Phone: [phone] Mobile Phone: [phone] Relation: Mother Secondary Emergency Contact: [place], [place] [address] [place] of [place] Phone: [phone] Mobile Phone: [phone] Relation: Friend  Code Status:  Full Code  Goals of care: Advanced Directive information Advanced Directives 10/17/2021  Does Patient Have a Medical Advance Directive? No  Would patient like information on creating a medical advance directive? No - Patient declined     Chief Complaint  Patient presents with   Acute Visit    Patient complains of pink eye, fever, chills, and sore throat.     HPI:  Pt is a 40 y.o. female seen today for an acute visit for evaluation of fever,chills,sore throat ,headache,fatigue x 2 weeks.States symptoms have resolved but was told to go home from work yesterday.states feeling well just still tired recovering from recent illness.  States has been out of work per health at work. Had  COVID-19 test was negative.She was placed LOA for 3 days.  States did not take her temp but felt hot and sweating.  States eyes have been dry and red x 2 weeks.  Has been feeling depressed being sick and other issues.but not bad to need medication.Has not been taking Effexor- XR states had stopped by herself. Thinks antidepressant makes her gain weight.She would like to restart Effexor.    Past Medical History:  Diagnosis Date   Allergy    Anxiety    Depression, major, single episode, in partial remission ([place]) 12/26/2018   pHQ 9 SCORE OF 12 IN 12/2018, score of 0 in 03/2019 on medication   Dry eyes 12/26/2018   H/O  gastric sleeve 2018   History of mammogram 2021   Obesity    Plantar fasciitis    Sleep apnea    Past Surgical History:  Procedure Laterality Date   BARIATRIC SURGERY  2018   CESAREAN SECTION     TUBAL LIGATION      No Known Allergies  Outpatient Encounter Medications as of 10/30/2021  Medication Sig   Cholecalciferol (VITAMIN D3 PO) Take 1 capsule by mouth once a week.   Ferrous Sulfate (IRON PO) Take 1 capsule by mouth daily.   fluticasone (FLONASE) 50 MCG/ACT nasal spray Place 2 sprays into both nostrils daily.   IFEREX 150 150 MG capsule Take 150 mg by mouth 2 (two) times daily.   levocetirizine (XYZAL) 5 MG tablet Take 1 tablet (5 mg total) by mouth every evening.   meloxicam (MOBIC) 15 MG tablet TAKE 1/2 TABLET(7.5 MG) BY MOUTH TWICE DAILY AS NEEDED FOR PAIN   pantoprazole (PROTONIX) 40 MG tablet Take 1 tablet (40 mg total) by mouth daily.   prednisoLONE acetate (PRED FORTE) 1 % ophthalmic suspension SMARTSIG:In Eye(s)   promethazine-dextromethorphan (PROMETHAZINE-DM) 6.25-15 MG/5ML syrup Take 5 mLs by mouth 4 (four) times daily as needed.   valACYclovir (VALTREX) 500 MG tablet Take 500 mg by mouth daily.   venlafaxine XR (EFFEXOR-XR) 75 MG 24 hr capsule Take 1 capsule (75 mg total) by mouth daily with breakfast. Dose increase effective 03/24/2019   Vitamin D, Ergocalciferol, (DRISDOL) 1.25 MG (50000 UNIT) CAPS capsule  TAKE 1 CAPSULE BY MOUTH ONCE A WEEK AS DIRECTED   [DISCONTINUED] methylPREDNISolone (MEDROL DOSEPAK) 4 MG TBPK tablet 6 day dose pack - take as directed   [DISCONTINUED] oseltamivir (TAMIFLU) 75 MG capsule Take 1 capsule (75 mg total) by mouth every 12 (twelve) hours.   No facility-administered encounter medications on file as of 10/30/2021.    Review of Systems  Constitutional:  Negative for appetite change, chills, fatigue, fever and unexpected weight change.  HENT:  Negative for congestion, dental problem, ear discharge, ear pain, facial swelling, hearing  loss, nosebleeds, postnasal drip, rhinorrhea, sinus pressure, sinus pain, sneezing, sore throat, tinnitus and trouble swallowing.   Eyes:  Negative for pain, discharge, redness, itching and visual disturbance.  Respiratory:  Negative for cough, chest tightness, shortness of breath and wheezing.   Cardiovascular:  Negative for chest pain, palpitations and leg swelling.  Gastrointestinal:  Negative for abdominal distention, abdominal pain, blood in stool, constipation, diarrhea, nausea and vomiting.  Endocrine: Negative for cold intolerance, heat intolerance, polydipsia, polyphagia and polyuria.  Genitourinary:  Negative for difficulty urinating, dysuria, flank pain, frequency and urgency.  Musculoskeletal:  Negative for arthralgias, back pain, gait problem, joint swelling, myalgias, neck pain and neck stiffness.  Skin:  Negative for color change, pallor, rash and wound.  Neurological:  Negative for dizziness, syncope, speech difficulty, weakness, light-headedness, numbness and headaches.  Hematological:  Does not bruise/bleed easily.  Psychiatric/Behavioral:  Negative for agitation, behavioral problems, confusion, hallucinations, self-injury, sleep disturbance and suicidal ideas. The patient is not nervous/anxious.    Immunization History  Administered Date(s) Administered   Influenza Split 10/06/2011, 09/01/2012, 07/23/2014   Influenza Whole 08/17/2007, 09/13/2009   Influenza,inj,Quad PF,6+ Mos 08/24/2013, 08/06/2016, 07/22/2017, 08/19/2018, 08/08/2019, 07/27/2020   Moderna Sars-Covid-2 Vaccination 12/09/2019, 01/06/2020, 08/27/2020   PPD Test 06/02/2014   Td 03/05/2005   Tdap 09/20/2010, 10/06/2011   Pertinent  Health Maintenance Due  Topic Date Due   INFLUENZA VACCINE  06/03/2021   PAP SMEAR-Modifier  08/19/2021   Fall Risk 05/29/2021 06/24/2021 06/30/2021 10/17/2021 10/20/2021  Falls in the past year? 0 0 - 0 -  Was there an injury with Fall? 0 0 - 0 -  Fall Risk Category Calculator 0  0 - 0 -  Fall Risk Category Low Low - Low -  Patient Fall Risk Level Low fall risk Low fall risk Low fall risk Low fall risk Low fall risk  Patient at Risk for Falls Due to No Fall Risks No Fall Risks - No Fall Risks -  Fall risk Follow up Falls evaluation completed Falls evaluation completed - Falls evaluation completed -   Functional Status Survey:    Vitals:   10/30/21 0948  BP: 126/84  Pulse: 75  Resp: 16  Temp: 98.1 F (36.7 C)  SpO2: 99%  Height: 5\' 8"  (1.727 m)   Body mass index is 39.68 kg/m. Physical Exam Vitals reviewed.  Constitutional:      General: She is not in acute distress.    Appearance: Normal appearance. She is normal weight. She is not ill-appearing or diaphoretic.  HENT:     Head: Normocephalic.     Right Ear: Tympanic membrane, ear canal and external ear normal. There is no impacted cerumen.     Left Ear: Tympanic membrane, ear canal and external ear normal. There is no impacted cerumen.     Nose: Nose normal. No congestion or rhinorrhea.     Mouth/Throat:     Mouth: Mucous membranes are moist.  Pharynx: Oropharynx is clear. No oropharyngeal exudate or posterior oropharyngeal erythema.  Eyes:     General: No scleral icterus.       Right eye: No discharge.        Left eye: No discharge.     Extraocular Movements: Extraocular movements intact.     Conjunctiva/sclera: Conjunctivae normal.     Pupils: Pupils are equal, round, and reactive to light.  Neck:     Vascular: No carotid bruit.  Cardiovascular:     Rate and Rhythm: Normal rate and regular rhythm.     Pulses: Normal pulses.     Heart sounds: Normal heart sounds. No murmur heard.   No friction rub. No gallop.  Pulmonary:     Effort: Pulmonary effort is normal. No respiratory distress.     Breath sounds: Normal breath sounds. No wheezing, rhonchi or rales.  Chest:     Chest wall: No tenderness.  Abdominal:     General: Bowel sounds are normal. There is no distension.     Palpations:  Abdomen is soft. There is no mass.     Tenderness: There is no abdominal tenderness. There is no right CVA tenderness, left CVA tenderness, guarding or rebound.  Musculoskeletal:        General: No swelling or tenderness. Normal range of motion.     Cervical back: Normal range of motion. No rigidity or tenderness.     Right lower leg: No edema.     Left lower leg: No edema.  Lymphadenopathy:     Cervical: No cervical adenopathy.  Skin:    General: Skin is warm and dry.     Coloration: Skin is not pale.     Findings: No bruising, erythema, lesion or rash.  Neurological:     Mental Status: She is alert and oriented to person, place, and time.     Cranial Nerves: No cranial nerve deficit.     Sensory: No sensory deficit.     Motor: No weakness.     Coordination: Coordination normal.     Gait: Gait normal.  Psychiatric:        Mood and Affect: Mood is depressed.        Speech: Speech normal.        Behavior: Behavior normal.        Thought Content: Thought content normal.        Judgment: Judgment normal.    Labs reviewed: Recent Labs    12/19/20 0919 02/06/21 0845 06/19/21 0827  NA 141 138 139  K 4.3 4.2 3.9  CL 104 102 106  CO2 24 28 27   GLUCOSE 80 81 82  BUN 10 13 9   CREATININE 0.73 0.74 0.64  CALCIUM 9.4 9.1 8.8   Recent Labs    02/06/21 0845 06/19/21 0827  AST 12 11  ALT 9 8  BILITOT 0.6 0.6  PROT 6.8 6.4   Recent Labs    12/19/20 0919 02/06/21 0845 06/19/21 0827  WBC 4.2 3.8 3.6*  NEUTROABS 1.7 1,782 1,843  HGB 12.6 12.9 12.3  HCT 37.7 38.6 36.6  MCV 93 95.1 94.6  PLT 280 306 273   Lab Results  Component Value Date   TSH 1.37 06/19/2021   Lab Results  Component Value Date   HGBA1C 4.9 06/04/2017   Lab Results  Component Value Date   CHOL 181 06/19/2021   HDL 66 06/19/2021   LDLCALC 100 (H) 06/19/2021   TRIG 67 06/19/2021   CHOLHDL 2.7 06/19/2021  Significant Diagnostic Results in last 30 days:  No results  found.  Assessment/Plan 1. Upper respiratory tract infection, unspecified type Afebrile Reports symptoms x 2 weeks but have resolved. States had x 3 days from health at work but was advised to take LOA from work by her Freight forwarder. Brought paperwork to be filled for [name] despite no symptoms today. Will screen for COVID-19 then may return to work 11/04/2021 if negative. Work Letter given today.   2. Moderate episode of recurrent major depressive disorder ([place]) Had stopped Effexor by herself.Symptoms have worsen   3. Dry eye No erythema or drainage  - artificial tears (LACRILUBE) OINT ophthalmic ointment; Place into both eyes 3 (three) times daily.  Dispense: 7 g; Refill: 5  4. Dry cough Has improved  - SARS-COV-2 RNA,(COVID-19) QUAL NAAT  5. Fever and chills Afebrile  - SARS-COV-2 RNA,(COVID-19) QUAL NAAT  6. Sore throat Resolved  - SARS-COV-2 RNA,(COVID-19) QUAL NAAT  Family/ staff Communication: Reviewed plan of care with patient verbalized understanding.   Labs/tests ordered: - SARS-COV-2 RNA,(COVID-19) QUAL NAAT  Next Appointment: As needed if symptoms worsen or fail to improve    Sandrea Hughs, NP

## 2021-10-30 NOTE — Patient Instructions (Addendum)
-   COVID-19 test done today.May return to work on 11/04/2021 if COVID-19 test results are negative.  - Restart your depression medication Effexor -XR

## 2021-10-31 ENCOUNTER — Ambulatory Visit: Payer: 59 | Admitting: Podiatry

## 2021-10-31 LAB — SARS-COV-2 RNA,(COVID-19) QUALITATIVE NAAT: SARS CoV2 RNA: NOT DETECTED

## 2021-11-03 ENCOUNTER — Ambulatory Visit: Payer: Self-pay

## 2021-11-03 ENCOUNTER — Ambulatory Visit
Admission: EM | Admit: 2021-11-03 | Discharge: 2021-11-03 | Disposition: A | Discharge status: Home / Self Care | Payer: No Typology Code available for payment source | Attending: Family Medicine | Admitting: Family Medicine

## 2021-11-03 ENCOUNTER — Other Ambulatory Visit: Payer: Self-pay

## 2021-11-03 DIAGNOSIS — J209 Acute bronchitis, unspecified: Secondary | ICD-10-CM | POA: Diagnosis not present

## 2021-11-03 DIAGNOSIS — J349 Unspecified disorder of nose and nasal sinuses: Secondary | ICD-10-CM

## 2021-11-03 DIAGNOSIS — T7840XA Allergy, unspecified, initial encounter: Secondary | ICD-10-CM

## 2021-11-03 DIAGNOSIS — R3915 Urgency of urination: Secondary | ICD-10-CM | POA: Diagnosis not present

## 2021-11-03 DIAGNOSIS — J3089 Other allergic rhinitis: Secondary | ICD-10-CM

## 2021-11-03 LAB — POCT URINALYSIS DIP (MANUAL ENTRY)
Bilirubin, UA: NEGATIVE
Blood, UA: NEGATIVE
Glucose, UA: NEGATIVE mg/dL
Ketones, POC UA: NEGATIVE mg/dL
Leukocytes, UA: NEGATIVE
Nitrite, UA: NEGATIVE
Protein Ur, POC: NEGATIVE mg/dL
Spec Grav, UA: 1.025 (ref 1.010–1.025)
Urobilinogen, UA: 0.2 E.U./dL
pH, UA: 6 (ref 5.0–8.0)

## 2021-11-03 MED ORDER — PREDNISONE 20 MG PO TABS: 40.0000 mg | ORAL_TABLET | Freq: Every day | ORAL | 0 refills | Status: DC

## 2021-11-03 MED ORDER — PROMETHAZINE-DM 6.25-15 MG/5ML PO SYRP: 5.0000 mL | ORAL_SOLUTION | Freq: Four times a day (QID) | ORAL | 0 refills | Status: DC | PRN

## 2021-11-03 MED ORDER — LEVOCETIRIZINE DIHYDROCHLORIDE 5 MG PO TABS: 5.0000 mg | ORAL_TABLET | Freq: Every evening | ORAL | 1 refills | Status: DC

## 2021-11-03 NOTE — ED Triage Notes (Signed)
Pt also reports urinary urgency

## 2021-11-03 NOTE — ED Provider Notes (Signed)
[facility]    CSN: [id number] Arrival date & time: 11/03/21  1043      History   Chief Complaint Chief Complaint  Patient presents with   Cough   Fatigue    HPI Stacey Palmer is a 41 y.o. female.   Presenting today with ongoing fatigue, cough, postnasal drip, chest tightness since viral respiratory infection that started 2 weeks ago.  Tested negative for COVID and flu at this urgent care when presenting with the symptoms initially.  Has been taking Phenergan DM, allergy medication as needed and over-the-counter cold and congestion medications with no benefit.  Denies recent fever, chest pain, shortness of breath, abdominal pain, nausea vomiting or diarrhea.  Known history of seasonal allergies, no known pulmonary disease.  Also having some urinary urgency without dysuria, fevers, abdominal pain.  Not trying anything for this.   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   Vaginal irritation 09/18/2020   Hip pain, right 09/16/2020   Snoring 09/16/2020   Labial skin tag 09/13/2020   Fatigue 12/06/2019   Allergies 12/06/2019   Hair loss 12/06/2019   GAD (generalized anxiety disorder) 12/26/2018   Gastroesophageal reflux disease 06/04/2017   Vitamin D deficiency 10/21/2016   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   Obesity (BMI 30-39.9) 06/11/2015   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 Authorizing Provider  predniSONE (DELTASONE) 20 MG tablet Take 2 tablets (40 mg total) by mouth daily with breakfast. 11/03/21  Yes Volney American, PA-C  artificial tears (LACRILUBE) OINT ophthalmic ointment Place into both eyes 3 (three) times daily. 10/30/21 11/29/21  Ngetich, [name] C, NP  Cholecalciferol (VITAMIN D3 PO) Take 1 capsule by mouth once a week.    [provider]  Ferrous Sulfate (IRON PO) Take 1 capsule by mouth daily.    [provider]  fluticasone (FLONASE) 50 MCG/ACT nasal spray Place 2 sprays into both nostrils daily. 07/07/21   Evelina Dun A, FNP  IFEREX 150 150 MG capsule Take 150 mg by mouth 2 (two)  times daily. 07/12/21   [provider]  levocetirizine (XYZAL) 5 MG tablet Take 1 tablet (5 mg total) by mouth every evening. 11/03/21   Particia Nearing, PA-C  meloxicam (MOBIC) 15 MG tablet TAKE 1/2 TABLET(7.5 MG) BY MOUTH TWICE DAILY AS NEEDED FOR PAIN 10/29/21   Edwin Cap, DPM  pantoprazole (PROTONIX) 40 MG tablet Take 1 tablet (40 mg total) by mouth daily. 04/16/20   Kerri Perches, MD  prednisoLONE acetate (PRED FORTE) 1 % ophthalmic suspension SMARTSIG:In Eye(s) 01/26/21   [provider]  promethazine-dextromethorphan (PROMETHAZINE-DM) 6.25-15 MG/5ML syrup Take 5 mLs by mouth 4 (four) times daily as needed. 11/03/21   Particia Nearing, PA-C  valACYclovir (VALTREX) 500 MG tablet Take 500 mg by mouth daily. 05/22/21   [provider]  venlafaxine XR (EFFEXOR-XR) 75 MG 24  hr capsule Take 1 capsule (75 mg total) by mouth daily with breakfast. Dose increase effective 03/24/2019 09/16/19   Kerri Perches, MD  Vitamin D, Ergocalciferol, (DRISDOL) 1.25 MG (50000 UNIT) CAPS capsule TAKE 1 CAPSULE BY MOUTH ONCE A WEEK AS DIRECTED 03/18/21   Kerri Perches, MD    Family History Family History  Problem Relation Age of Onset   Hypertension Mother    Kidney disease Father    Diabetes Father    Breast cancer Paternal Aunt    Dementia Paternal Grandmother    Asthma Daughter     Social History Social History   Tobacco Use   Smoking status: Never   Smokeless tobacco: Never  Vaping Use   Vaping Use: Never used  Substance Use Topics   Alcohol use: No   Drug use: No     Allergies   Patient has no known allergies.   Review of Systems Review of Systems Per HPI  Physical Exam Triage Vital Signs ED Triage Vitals  Enc Vitals Group     BP 11/03/21 1110 122/85     Pulse Rate 11/03/21 1110 73     Resp 11/03/21 1110 18     Temp 11/03/21 1110 98.1 F (36.7 C)     Temp src --      SpO2 11/03/21 1110 98 %     Weight --      Height --      Head Circumference --      Peak Flow --      Pain Score 11/03/21 1109 0     Pain Loc --      Pain Edu? --      Excl. in GC? --    No data found.  Updated Vital Signs BP 122/85    Pulse 73    Temp 98.1 F (36.7 C)    Resp 18    LMP 10/13/2021 (Approximate)    SpO2 98%   Visual Acuity Right Eye Distance:   Left Eye Distance:   Bilateral Distance:    Right Eye Near:   Left Eye Near:    Bilateral Near:     Physical Exam Vitals and nursing note reviewed.  Constitutional:      Appearance: Normal appearance.  HENT:     Head: Atraumatic.     Right Ear: Tympanic membrane and external ear normal.     Left Ear: Tympanic membrane and external ear normal.     Nose: Rhinorrhea present.     Mouth/Throat:     Mouth: Mucous membranes are moist.     Pharynx: Posterior oropharyngeal erythema present.   Eyes:  Extraocular Movements: Extraocular movements intact.     Conjunctiva/sclera: Conjunctivae normal.  Cardiovascular:     Rate and Rhythm: Normal rate and regular rhythm.     Heart sounds: Normal heart sounds.  Pulmonary:     Effort: Pulmonary effort is normal.     Breath sounds: Normal breath sounds. No wheezing or rales.  Musculoskeletal:        General: Normal range of motion.     Cervical back: Normal range of motion and neck supple.  Skin:    General: Skin is warm and dry.  Neurological:     Mental Status: She is alert and oriented to person, place, and time.  Psychiatric:        Mood and Affect: Mood normal.        Thought Content: Thought content normal.   UC Treatments / Results  Labs (all labs ordered are listed, but only abnormal results are displayed) Labs Reviewed  POCT URINALYSIS DIP (MANUAL ENTRY)   EKG  Radiology No results found.  Procedures Procedures (including critical care time)  Medications Ordered in UC Medications - No data to display  Initial Impression / Assessment and Plan / UC Course  I have reviewed the triage vital signs and the nursing notes.  Pertinent labs & imaging results that were available during my care of the patient were reviewed by me and considered in my medical decision making (see chart for details).     Suspect postviral inflammatory symptoms, treat with prednisone, refill allergy regimen and discussed Phenergan DM, supportive home care for further management.  UA without evidence of urinary tract infection today.  Push fluids, avoid bladder irritants.  Return for worsening symptoms.  Final Clinical Impressions(s) / UC Diagnoses   Final diagnoses:  Acute bronchitis, unspecified organism  Seasonal allergic rhinitis due to other allergic trigger  Urinary urgency   Discharge Instructions   None    ED Prescriptions     Medication Sig Dispense Auth. Provider   levocetirizine (XYZAL) 5 MG tablet Take 1 tablet  (5 mg total) by mouth every evening. 30 tablet Volney American, [place]   promethazine-dextromethorphan (PROMETHAZINE-DM) 6.25-15 MG/5ML syrup Take 5 mLs by mouth 4 (four) times daily as needed. 100 mL Volney American, PA-C   predniSONE (DELTASONE) 20 MG tablet Take 2 tablets (40 mg total) by mouth daily with breakfast. 10 tablet Volney American, [place]      PDMP not reviewed this encounter.   Volney American, [place] 11/03/21 1137

## 2021-11-03 NOTE — ED Triage Notes (Signed)
Pt returns for continued symptoms of fatigue and cough that she was seen for on 12/18

## 2021-11-05 ENCOUNTER — Ambulatory Visit: Payer: No Typology Code available for payment source | Admitting: Podiatry

## 2021-11-06 ENCOUNTER — Ambulatory Visit: Payer: No Typology Code available for payment source | Admitting: Family Medicine

## 2021-11-06 ENCOUNTER — Ambulatory Visit: Payer: No Typology Code available for payment source | Admitting: Adult Health

## 2021-11-06 ENCOUNTER — Telehealth: Payer: Self-pay | Admitting: Family

## 2021-11-06 NOTE — Telephone Encounter (Signed)
noted 

## 2021-11-06 NOTE — Telephone Encounter (Signed)
Appointment scheduled to re evaluate with [name] today at 1:00. Patient is requesting a work note to be out longer.

## 2021-11-06 NOTE — Telephone Encounter (Signed)
Patient says the prednozone is not working, she is still coughing a lot & not feeling good at all she says she is not ready to go back to work. She is asking if she should go back to an urgent care or what she should do. She said she went to work Monday and Tuesday but she is not ready for that yet. So should she come back in or go to an urgent care? What would you recommend her do?

## 2021-11-07 ENCOUNTER — Telehealth: Payer: Self-pay | Admitting: *Deleted

## 2021-11-07 ENCOUNTER — Ambulatory Visit: Payer: No Typology Code available for payment source | Admitting: Physical Therapy

## 2021-11-07 NOTE — Telephone Encounter (Signed)
Tried calling patient. Mailbox is full and cannot leave message.

## 2021-11-07 NOTE — Telephone Encounter (Signed)
Need appointment for evaluation for longer work note/[name]

## 2021-11-07 NOTE — Telephone Encounter (Signed)
Received fax from [employer] for [name] Paperwork for Stacey Palmer.  Leave of Absence Intake Number: [id number]  Paperwork placed in [name]'s folder to review and fill out.  To be faxed back to Fax: [phone] once completed.    Patient had an appointment with [name] yesterday 11/06/21 to follow up because she wasn't feeling better and needed a longer work note. Patient canceled appointment.

## 2021-11-08 NOTE — Telephone Encounter (Signed)
Tried calling patient. Mailbox is full and cannot leave message.  °

## 2021-11-11 NOTE — Telephone Encounter (Signed)
Tried calling patient. Mailbox is full and cannot leave message.  °

## 2021-11-20 ENCOUNTER — Ambulatory Visit: Payer: No Typology Code available for payment source | Admitting: Family

## 2021-11-20 NOTE — Telephone Encounter (Signed)
Patient called stating that she needs the paperwork for [facility] faxed to [employer].   Informed patient that we having been trying to call because dinah is requesting an appointment for evaluation for longer work note.   Patient scheduled an appointment today to see [name] at 3:45

## 2021-11-20 NOTE — Telephone Encounter (Signed)
Noted  

## 2021-11-21 ENCOUNTER — Telehealth: Payer: Self-pay | Admitting: *Deleted

## 2021-11-21 NOTE — Telephone Encounter (Signed)
[name] Paperwork was brought to me to fill out by [place]. Patient did not show for appointment on 11/20/21 to have filled out, patient canceled appointment.   Paperwork filled out with Dates treated 10/30/2021-11/05/2021. Patient has canceled several follow up appointments.   Paperwork given back to [place] for [name] to review.   To be faxed back to [employer] Fax: [phone]  Leave of Absence Intake SELTRV:[id number]

## 2021-11-21 NOTE — Telephone Encounter (Signed)
Paperwork received back from [place] requesting me to fax it to [employer].  Faxed: [phone]  Copy sent to [place].

## 2021-11-22 ENCOUNTER — Ambulatory Visit
Admission: RE | Admit: 2021-11-22 | Discharge: 2021-11-22 | Disposition: A | Discharge status: Home / Self Care | Payer: No Typology Code available for payment source | Source: Ambulatory Visit | Attending: Urgent Care | Admitting: Urgent Care

## 2021-11-22 ENCOUNTER — Other Ambulatory Visit: Payer: Self-pay

## 2021-11-22 VITALS — BP 124/83 | HR 76 | Temp 98.3°F | Resp 18

## 2021-11-22 DIAGNOSIS — J018 Other acute sinusitis: Secondary | ICD-10-CM | POA: Diagnosis not present

## 2021-11-22 DIAGNOSIS — J3089 Other allergic rhinitis: Secondary | ICD-10-CM

## 2021-11-22 MED ORDER — AMOXICILLIN 875 MG PO TABS: 875.0000 mg | ORAL_TABLET | Freq: Two times a day (BID) | ORAL | 0 refills | Status: DC

## 2021-11-22 MED ORDER — PSEUDOEPHEDRINE HCL 60 MG PO TABS: 60.0000 mg | ORAL_TABLET | Freq: Three times a day (TID) | ORAL | 0 refills | Status: DC | PRN

## 2021-11-22 MED ORDER — IPRATROPIUM BROMIDE 0.03 % NA SOLN: 2.0000 | Freq: Two times a day (BID) | NASAL | 0 refills | Status: DC

## 2021-11-22 NOTE — ED Provider Notes (Signed)
[facility]   MRN: [id number] DOB: 09/10/1981  Subjective:   Stacey Palmer is a 41 y.o. female presenting for 1 month history of persistent malaise, fatigue, sinus congestion, intermittent ear pains, throat pain and coughing.  Her cough has improved after taking the steroid but she still has the severe sinus symptoms.  She has not been using her Flonase nasal spray but did start back up on the antihistamine as prescribed.  She has tested negative for flu and COVID throughout this time.  No chest pain, shortness of breath or wheezing.  She does have a history of significant allergic rhinitis but is inconsistent with her medications.  She is not a smoker.  No current facility-administered medications for this encounter.  Current Outpatient Medications:    artificial tears (LACRILUBE) OINT ophthalmic ointment, Place into both eyes 3 (three) times daily., Disp: 7 g, Rfl: 5   Cholecalciferol (VITAMIN D3 PO), Take 1 capsule by mouth once a week., Disp: , Rfl:    Ferrous Sulfate (IRON PO), Take 1 capsule by mouth daily., Disp: , Rfl:    fluticasone (FLONASE) 50 MCG/ACT nasal spray, Place 2 sprays into both nostrils daily., Disp: 16 g, Rfl: 6   IFEREX 150 150 MG capsule, Take 150 mg by mouth 2 (two) times daily., Disp: , Rfl:    levocetirizine (XYZAL) 5 MG tablet, Take 1 tablet (5 mg total) by mouth every evening., Disp: 30 tablet, Rfl: 1   meloxicam (MOBIC) 15 MG tablet, TAKE 1/2 TABLET(7.5 MG) BY MOUTH TWICE DAILY AS NEEDED FOR PAIN, Disp: 60 tablet, Rfl: 0   pantoprazole (PROTONIX) 40 MG tablet, Take 1 tablet (40 mg total) by mouth daily., Disp: 30 tablet, Rfl: 3   prednisoLONE acetate (PRED FORTE) 1 % ophthalmic suspension, SMARTSIG:In Eye(s), Disp: , Rfl:    predniSONE (DELTASONE) 20 MG tablet, Take 2 tablets (40 mg total) by mouth daily with breakfast., Disp: 10 tablet, Rfl: 0   promethazine-dextromethorphan (PROMETHAZINE-DM) 6.25-15 MG/5ML syrup, Take 5 mLs by mouth 4 (four)  times daily as needed., Disp: 100 mL, Rfl: 0   valACYclovir (VALTREX) 500 MG tablet, Take 500 mg by mouth daily., Disp: , Rfl:    venlafaxine XR (EFFEXOR-XR) 75 MG 24 hr capsule, Take 1 capsule (75 mg total) by mouth daily with breakfast. 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/26/2018   H/O gastric sleeve 2018   History of mammogram 2021   Obesity    Plantar fasciitis    Sleep apnea      Past Surgical History:  Procedure Laterality Date   BARIATRIC SURGERY  2018   CESAREAN SECTION     TUBAL LIGATION      Family History  Problem Relation Age of Onset   Hypertension Mother    Kidney disease Father    Diabetes Father    Breast cancer Paternal Aunt    Dementia Paternal Grandmother    Asthma Daughter     Social History   Tobacco Use   Smoking status: Never   Smokeless  tobacco: Never  Vaping Use   Vaping Use: Never used  Substance Use Topics   Alcohol use: No   Drug use: No    ROS   Objective:   Vitals: BP 124/83 (BP Location: Right Arm)    Pulse 76    Temp 98.3 F (36.8 C) (Oral)    Resp 18    SpO2 96%   Physical Exam Constitutional:      General: She is not in acute distress.    Appearance: Normal appearance. She is well-developed and normal weight. She is not ill-appearing, toxic-appearing or diaphoretic.  HENT:     Head: Normocephalic and atraumatic.     Right Ear: Tympanic membrane, ear canal and external ear normal. No drainage, swelling or tenderness. No middle ear effusion. There is no impacted cerumen. Tympanic membrane is not erythematous.      Left Ear: Tympanic membrane, ear canal and external ear normal. No drainage, swelling or tenderness.  No middle ear effusion. There is no impacted cerumen. Tympanic membrane is not erythematous.     Nose: Congestion present. No rhinorrhea.     Comments: Nasal mucosa boggy and edematous.    Mouth/Throat:     Mouth: Mucous membranes are moist. No oral lesions.     Pharynx: No pharyngeal swelling, oropharyngeal exudate, posterior oropharyngeal erythema or uvula swelling.     Tonsils: No tonsillar exudate or tonsillar abscesses.  Eyes:     General: No scleral icterus.       Right eye: No discharge.        Left eye: No discharge.     Extraocular Movements: Extraocular movements intact.     Right eye: Normal extraocular motion.     Left eye: Normal extraocular motion.     Conjunctiva/sclera: Conjunctivae normal.  Cardiovascular:     Rate and Rhythm: Normal rate.     Heart sounds: No murmur heard.   No friction rub. No gallop.  Pulmonary:     Effort: Pulmonary effort is normal. No respiratory distress.     Breath sounds: No stridor. No wheezing, rhonchi or rales.  Chest:     Chest wall: No tenderness.  Musculoskeletal:     Cervical back: Normal range of motion and neck supple.  Lymphadenopathy:     Cervical: No cervical adenopathy.  Skin:    General: Skin is warm and dry.  Neurological:     General: No focal deficit present.     Mental Status: She is alert and oriented to person, place, and time.  Psychiatric:        Mood and Affect: Mood normal.        Behavior: Behavior normal.    Assessment and Plan :   PDMP not reviewed this encounter.  1. Allergic rhinitis due to other allergic trigger, unspecified seasonality   2. Acute non-recurrent sinusitis of other sinus    Deferred imaging given clear cardiopulmonary exam, hemodynamically stable vital signs.  Deferred testing given multiple negative tests and also with the timeline of her illness.  Suspect that the primary issue is  uncontrolled allergic rhinitis.  She is already undergone a steroid course and therefore we will focus on the secondary sinusitis.  Discussed need for long-term treatment of her allergic rhinitis and patient is agreeable to this.  For now, use amoxicillin to address a secondary sinusitis. Counseled patient on potential for adverse effects with medications prescribed/recommended today, ER and return-to-clinic precautions discussed, patient verbalized understanding.    Wallis Bamberg, PA-C 11/22/21 1040

## 2021-11-22 NOTE — Discharge Instructions (Addendum)
Finish the amoxicillin first to address a sinus infection. In about 2 weeks get started with Flonase again to address the primary problem of allergic rhinitis. Take that daily with Xyzal to keep your allergic rhinitis at bay. Use Sudafed through the weekend and then as needed.

## 2021-11-22 NOTE — ED Triage Notes (Signed)
Pt reports runny nose x 2 weeks; sore throat x 1 week; right ear pain and fatigue x 1 day.

## 2021-12-17 ENCOUNTER — Other Ambulatory Visit: Payer: Self-pay

## 2021-12-17 ENCOUNTER — Ambulatory Visit: Payer: No Typology Code available for payment source | Attending: Podiatry

## 2021-12-17 DIAGNOSIS — M6281 Muscle weakness (generalized): Secondary | ICD-10-CM | POA: Insufficient documentation

## 2021-12-17 DIAGNOSIS — M79672 Pain in left foot: Secondary | ICD-10-CM | POA: Diagnosis not present

## 2021-12-17 DIAGNOSIS — M722 Plantar fascial fibromatosis: Secondary | ICD-10-CM | POA: Insufficient documentation

## 2021-12-17 DIAGNOSIS — R262 Difficulty in walking, not elsewhere classified: Secondary | ICD-10-CM | POA: Insufficient documentation

## 2021-12-17 DIAGNOSIS — M79671 Pain in right foot: Secondary | ICD-10-CM | POA: Insufficient documentation

## 2021-12-17 NOTE — Therapy (Signed)
OUTPATIENT PHYSICAL THERAPY LOWER EXTREMITY EVALUATION   Patient Name: Stacey Palmer MRN: [id number] DOB:07/19/1981, 41 y.o., female Today's Date: 12/17/2021   PT End of Session - 12/17/21 1640     Visit Number 1    Number of Visits 17    Date for PT Re-Evaluation 02/11/22    Authorization Type MCE    Progress Note Due on Visit 10    PT Start Time 1610    PT Stop Time 1650    PT Time Calculation (min) 40 min    Activity Tolerance Patient tolerated treatment well    Behavior During Therapy WFL for tasks assessed/performed             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/26/2018   H/O gastric sleeve 2018   History of mammogram 2021   Obesity    Plantar fasciitis    Sleep apnea    Past Surgical History:  Procedure Laterality Date   BARIATRIC SURGERY  2018   CESAREAN SECTION     TUBAL LIGATION     Patient Active Problem List   Diagnosis Date Noted   Abdominal cramps 12/16/2020   COVID-19 virus infection 11/30/2020   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   Vaginal irritation 09/18/2020   Hip pain, right 09/16/2020   Snoring 09/16/2020   Labial skin tag 09/13/2020   Fatigue 12/06/2019   Allergies 12/06/2019   Hair loss 12/06/2019   GAD (generalized anxiety disorder) 12/26/2018   Gastroesophageal reflux disease 06/04/2017   Vitamin D deficiency 10/21/2016   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   Obesity (BMI 30-39.9) 06/11/2015   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 Pt reports first step pain when getting out of bed in the morning or when standing after prolonged sitting. She reports the pain is a burning sensation, but can also be sharp or achy. 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 Aggravating factors include prolonged standing >15 minutes, stair negotiation, and prolonged standing >15 minutes. Easing factors include pain medication and rest. Current pain is 9/10. Worst pain is 10/10. Best pain is 0/10. Pt denies unexplained weight change, N/T, changes in bowel or bladder function, unrelenting night pain, and nausea/ vomiting.  PERTINENT HISTORY: N/A  PAIN:  Are you having pain? Yes NPRS scale: 9/10 Pain location: BIL plantar feet PAIN TYPE: burning Pain description: intermittent  Aggravating factors: prolonged standing >15 minutes, stair negotiation, prolonged standing >15 minutes Relieving factors: pain medication and rest  PRECAUTIONS: None  WEIGHT BEARING RESTRICTIONS No  FALLS:  Has patient fallen in last 6 months? No, Number of falls: 0  LIVING ENVIRONMENT: Lives with: lives with their family Lives in: House/apartment  Stairs: Yes; External: 30 steps; on right going up, on left going up, and can reach both Has following equipment at home: None  OCCUPATION: Environmental services for [employer]  PLOF: Independent  PATIENT GOALS Household chores, working out for exercise, performing job duties with less pain  Screening for Suicide  Answer the following questions with Yes or No and place an "x" beside the action taken.  1. Over the past two weeks, have you felt down, depressed, or hopeless?   No  2. Within the past two weeks, have  you felt little interest or pleasure in life?  No  If YES to either #1 or #2, then ask #3  3. Have you had thoughts that that life is not worth living or that you might be       better off dead?     If answer is NO and suspicion is low, then end   4. Over this past week, have you had any thoughts about hurting or even killing yourself?    If NO, then end. Patient in no immediate danger   5. If so, do you believe that you intend to or will harm yourself?       If NO, then end. Patient in no immediate danger   6.  Do you have a plan as to how you would hurt yourself?     7.  Over this past week, have you actually done anything to hurt yourself?    IF YES answers to either #4, #5, #6 or #7, then patient is AT RISK for suicide   Actions Taken  __X__  Screening negative; no further action required  ____  Screening positive; no immediate danger and patient already in treatment with a  mental health provider. Advise patient to speak to their mental health provider.  ____  Screening positive; no immediate danger. Patient advised to contact a mental  health provider for further assessment.   ____  Screening positive; in immediate danger as patient states intention of killing self,  has plan and a sense of imminence. Do not leave alone. Seek permission from  patient to contact a family member to inform them. Direct patient to go to ED.   OBJECTIVE:   DIAGNOSTIC FINDINGS: 08/29/2022: MR Heel Left without Contrast: IMPRESSION: 1. Mild tendinosis and tenosynovitis of the peroneus longus. 2. Mild muscle edema in the abductor hallucis muscle and flexor digitorum brevis muscle concerning for muscle strain.  PATIENT SURVEYS:  FOTO 48%, predicted 67% in 14 visits  COGNITION:  Overall cognitive status: Within functional limits for tasks assessed     SENSATION:  Light touch: Appears intact    MUSCLE LENGTH: Gastrocnemius: Soleus:  POSTURE:  Pt stands with BIL pes  planus  PALPATION: TTP to BIL plantar heel and medial arch  LE AROM/PROM:  A/PROM Right 12/17/2021 Left 12/17/2021  Ankle dorsiflexion -6/6 2/5  Ankle plantarflexion 50/50 45/45  Ankle inversion 25/45 35/50  Ankle eversion 25/45 10/35   (Blank rows = not tested)  LE MMT:  MMT Right 12/17/2021 Left 12/17/2021  Hip flexion 4/5 4/5  Hip extension 3/5 3/5  Hip abduction 3+/5 3+/5  Knee flexion 5/5 5/5  Knee extension 5/5 5/5  Ankle dorsiflexion 4/5 4/5  Ankle plantarflexion 5/5p! 5/5p!  Ankle inversion 4/5p! 4/5p!  Ankle eversion 4/5p! 4/5p!   (Blank rows = not tested)  LOWER EXTREMITY SPECIAL TESTS:  Windlass test: (+) BIL Step-down test: (+) p! On Lt Too many toes sign: (+)  BIL Figure 8 swelling measure: Rt: 51cm, Lt: 52cm  FUNCTIONAL TESTS:  Squat: 75%,no pain 5xSTS: 18 seconds SLS: Lt: 4 seconds before LOB, Rt: 2 seconds before LOB DL heel raise J62: G31 before stopping due to pain/ fatigue SL heel raise x10: Rt: x3 with poor form and pain, Lt: unable to perform due to weakness/ pain  GAIT: Distance walked: 20 ft Assistive device utilized: None Level of assistance: Complete Independence Comments: Pt walks on lateral aspect of BIL feet, antalgic gait    TODAY'S TREATMENT: [facility] Adult PT Treatment:                                                DATE: 12/17/2021 Therapeutic Exercise: Heel Raises with UE support 2x15 Long Sitting Plantar Fascia Stretch with Towel x1 min BIL Seated Plantar Fascia Stretch x1 min BIL Gastroc Stretch on Wall x1 min BIL Manual Therapy: N/A Neuromuscular re-ed: N/A Therapeutic Activity: N/A Modalities: N/A Self Care: N/A     PATIENT EDUCATION:  Education details: Pt educated on probable underlying pathophysiology behind pain presentation, prognosis, POC, FOTO, HEP Person educated: Patient Education method: Programmer, multimedia, Demonstration, and Handouts Education comprehension: verbalized understanding and returned  demonstration   HOME EXERCISE PROGRAM: Access Code: [email or web address] URL: [email or web address] Date: 12/17/2021 Prepared by: Carmelina Dane  Exercises Heel Raises with Counter Support - 1 x daily - 7 x weekly - 3 sets - 15 reps Long Sitting Plantar Fascia Stretch with Towel - 1 x daily - 7 x weekly - 2 sets - 1-min hold Seated Plantar Fascia Stretch - 1 x daily - 7 x weekly - 2 sets - 1-min hold Gastroc Stretch on Wall - 1 x daily - 7 x weekly - 2 sets - 1-min hold   ASSESSMENT:  CLINICAL IMPRESSION: Patient is a 41 y.o. F who was seen today for physical therapy evaluation and treatment for Chronic BIL plantar foot pain. Objective impairments include Abnormal gait, decreased balance, decreased endurance, decreased mobility, difficulty walking, decreased ROM, decreased strength, increased edema, impaired flexibility, and pain. Ruling up BIL plantar fasciitis due to report of first step pain, positive too many toes sign, TTP to BIL plantar heel and medial arch, positive Windlass test BIL, and BIL tight calves. Pt's impairments are limiting patient from cleaning, community activity, occupation, laundry, yard work, and shopping. Patient will benefit from skilled PT to address above impairments and improve overall function.  REHAB POTENTIAL: Good  CLINICAL DECISION MAKING: Stable/uncomplicated  EVALUATION COMPLEXITY: Low   GOALS: Goals reviewed with patient? No  SHORT TERM GOALS:  STG Name Target Date Goal status  1 Pt will report understanding and adherence to her HEP in order to promote independence in the management of her primary impairments. Baseline: HEP provided at eval 02/11/2022 INITIAL   LONG TERM GOALS:   LTG Name Target Date Goal status  1 Pt will demonstrate 10 SL heel raises BIL in order to reach into overhead cabinets at work with less limitation. Baseline: 3 on Rt, unable on Lt 02/11/2022 INITIAL  2 Pt will achieve a FOTO score of 67% in order to  demonstrate improved functional ability as it relates to her foot pain. Baseline: 48% 02/11/2022 INITIAL  3 Pt will achieve BIL ankle dorsiflexion of 10 degrees in order to promote WNL gait. Baseline: -6 on Rt, 2 on Lt 02/11/2022 INITIAL  4 Pt  will report ability to walk >1 hour with 0-3/10 pain in order to perform her work duties with less limitation. Baseline: >8/10 pain after 15 minutes of walking 02/11/2022 INITIAL  5 Pt will achieve BIL global LE strength of 4+/5 or greater in order to progress her independent strengthening program with less limitation. Baseline: See MMT chart 02/11/2022 INITIAL   PLAN: PT FREQUENCY: 2x/week  PT DURATION: 8 weeks  PLANNED INTERVENTIONS: Therapeutic exercises, Therapeutic activity, Neuro Muscular re-education, Balance training, Gait training, Patient/Family education, Joint mobilization, Stair training, Dry Needling, Cryotherapy, Moist heat, Taping, Vasopneumatic device, and Manual therapy  PLAN FOR NEXT SESSION: Progress calf/ intrinsic foot strengthening and stretching   Carmelina DaneYarborough, , PT, DPT 12/17/21 5:04 PM

## 2021-12-24 ENCOUNTER — Other Ambulatory Visit: Payer: Medicaid Other | Admitting: Adult Health

## 2021-12-24 NOTE — Therapy (Incomplete)
OUTPATIENT PHYSICAL THERAPY TREATMENT NOTE   Patient Name: Stacey Palmer MRN: [id number] DOB:04/12/1981, 41 y.o., female Today's Date: 12/24/2021  PCP: Pcp, No REFERRING PROVIDER: Edwin Cap, DPM    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/26/2018   H/O gastric sleeve 2018   History of mammogram 2021   Obesity    Plantar fasciitis    Sleep apnea    Past Surgical History:  Procedure Laterality Date   BARIATRIC SURGERY  2018   CESAREAN SECTION     TUBAL LIGATION     Patient Active Problem List   Diagnosis Date Noted   Abdominal cramps 12/16/2020   COVID-19 virus infection 11/30/2020   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   Vaginal irritation 09/18/2020   Hip pain, right 09/16/2020   Snoring 09/16/2020   Labial skin tag 09/13/2020   Fatigue 12/06/2019   Allergies 12/06/2019   Hair loss 12/06/2019   GAD (generalized anxiety disorder) 12/26/2018   Gastroesophageal reflux disease 06/04/2017   Vitamin D deficiency 10/21/2016   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   SUBJECTIVE: ***  PAIN:  Are you having pain? Yes NPRS scale: 9/10 Pain location: BIL plantar feet PAIN TYPE: burning Pain description: intermittent  Aggravating factors: prolonged standing >15 minutes, stair negotiation, prolonged standing >15 minutes Relieving factors: pain medication and rest     OBJECTIVE:   *Unless otherwise noted, objective information collected  previously*  DIAGNOSTIC FINDINGS: 08/29/2022: MR Heel Left without Contrast: IMPRESSION: 1. Mild tendinosis and tenosynovitis of the peroneus longus. 2. Mild muscle edema in the abductor hallucis muscle and flexor digitorum brevis muscle concerning for muscle strain.   PATIENT SURVEYS:  FOTO 48%, predicted 67% in 14 visits   COGNITION:          Overall cognitive status: Within functional limits for tasks assessed                        SENSATION:          Light touch: Appears intact             MUSCLE LENGTH: Gastrocnemius: Soleus:   POSTURE:  Pt stands with BIL pes planus   PALPATION: TTP to BIL plantar heel and medial arch   LE AROM/PROM:   A/PROM Right 12/17/2021 Left 12/17/2021  Ankle dorsiflexion -6/6 2/5  Ankle plantarflexion 50/50 45/45  Ankle inversion 25/45 35/50  Ankle eversion 25/45 10/35   (Blank rows = not tested)   LE MMT:   MMT Right 12/17/2021 Left 12/17/2021  Hip flexion 4/5 4/5  Hip extension 3/5 3/5  Hip abduction 3+/5 3+/5  Knee flexion 5/5 5/5  Knee extension 5/5 5/5  Ankle dorsiflexion 4/5 4/5  Ankle plantarflexion 5/5p! 5/5p!  Ankle inversion 4/5p! 4/5p!  Ankle eversion 4/5p! 4/5p!   (Blank rows = not tested)   LOWER EXTREMITY SPECIAL TESTS:  Windlass test: (+) BIL Step-down test: (+) p! On  Lt Too many toes sign: (+) BIL Figure 8 swelling measure: Rt: 51cm, Lt: 52cm   FUNCTIONAL TESTS:  Squat: 75%,no pain 5xSTS: 18 seconds SLS: Lt: 4 seconds before LOB, Rt: 2 seconds before LOB DL heel raise R44: R15 before stopping due to pain/ fatigue SL heel raise x10: Rt: x3 with poor form and pain, Lt: unable to perform due to weakness/ pain   GAIT: Distance walked: 20 ft Assistive device utilized: None Level of assistance: Complete Independence Comments: Pt walks on lateral aspect of BIL feet, antalgic gait       TODAY'S TREATMENT:  [facility] Adult PT Treatment:                                                DATE: 12/25/2021 Therapeutic  Exercise: *** Manual Therapy: *** Neuromuscular re-ed: *** Therapeutic Activity: *** Modalities: *** Self Care: [facility] Adult PT Treatment:                                                DATE: 12/17/2021 Therapeutic Exercise: Heel Raises with UE support 2x15 Long Sitting Plantar Fascia Stretch with Towel x1 min BIL Seated Plantar Fascia Stretch x1 min BIL Gastroc Stretch on Wall x1 min BIL Manual Therapy: N/A Neuromuscular re-ed: N/A Therapeutic Activity: N/A Modalities: N/A Self Care: N/A         PATIENT EDUCATION:  Education details: Pt educated on probable underlying pathophysiology behind pain presentation, prognosis, POC, FOTO, HEP Person educated: Patient Education method: Programmer, multimedia, Demonstration, and Handouts Education comprehension: verbalized understanding and returned demonstration     HOME EXERCISE PROGRAM: Access Code: [email or web address] URL: [email or web address] Date: 12/17/2021 Prepared by: Carmelina Dane   Exercises Heel Raises with Counter Support - 1 x daily - 7 x weekly - 3 sets - 15 reps Long Sitting Plantar Fascia Stretch with Towel - 1 x daily - 7 x weekly - 2 sets - 1-min hold Seated Plantar Fascia Stretch - 1 x daily - 7 x weekly - 2 sets - 1-min hold Gastroc Stretch on Wall - 1 x daily - 7 x weekly - 2 sets - 1-min hold     ASSESSMENT:   CLINICAL IMPRESSION: ***   REHAB POTENTIAL: Good   CLINICAL DECISION MAKING: Stable/uncomplicated   EVALUATION COMPLEXITY: Low     GOALS: Goals reviewed with patient? No   SHORT TERM GOALS:   STG Name Target Date Goal status  1 Pt will report understanding and adherence to her HEP in order to promote independence in the management of her primary impairments. Baseline: HEP provided at eval 02/11/2022 INITIAL    LONG TERM GOALS:    LTG Name Target Date Goal status  1 Pt will demonstrate 10 SL heel raises BIL in order to reach into overhead cabinets at work with less  limitation. Baseline: 3 on Rt, unable on Lt 02/11/2022 INITIAL  2 Pt will achieve a FOTO score of 67% in order to demonstrate improved functional ability as it relates to her foot pain. Baseline: 48% 02/11/2022 INITIAL  3 Pt will achieve BIL ankle dorsiflexion of 10 degrees in order to promote WNL gait. Baseline: -6 on Rt, 2 on Lt 02/11/2022 INITIAL  4 Pt will report ability  to walk >1 hour with 0-3/10 pain in order to perform her work duties with less limitation. Baseline: >8/10 pain after 15 minutes of walking 02/11/2022 INITIAL  5 Pt will achieve BIL global LE strength of 4+/5 or greater in order to progress her independent strengthening program with less limitation. Baseline: See MMT chart 02/11/2022 INITIAL    PLAN: PT FREQUENCY: 2x/week   PT DURATION: 8 weeks   PLANNED INTERVENTIONS: Therapeutic exercises, Therapeutic activity, Neuro Muscular re-education, Balance training, Gait training, Patient/Family education, Joint mobilization, Stair training, Dry Needling, Cryotherapy, Moist heat, Taping, Vasopneumatic device, and Manual therapy   PLAN FOR NEXT SESSION: Progress calf/ intrinsic foot strengthening and stretching    Carmelina Dane, PT, DPT 12/24/21 3:02 PM

## 2021-12-25 ENCOUNTER — Ambulatory Visit: Payer: No Typology Code available for payment source

## 2021-12-26 ENCOUNTER — Other Ambulatory Visit: Payer: Self-pay | Admitting: Podiatry

## 2021-12-31 ENCOUNTER — Ambulatory Visit: Payer: No Typology Code available for payment source

## 2021-12-31 ENCOUNTER — Other Ambulatory Visit: Payer: Self-pay

## 2021-12-31 DIAGNOSIS — M6281 Muscle weakness (generalized): Secondary | ICD-10-CM

## 2021-12-31 DIAGNOSIS — M79671 Pain in right foot: Secondary | ICD-10-CM

## 2021-12-31 DIAGNOSIS — R262 Difficulty in walking, not elsewhere classified: Secondary | ICD-10-CM

## 2021-12-31 DIAGNOSIS — M79672 Pain in left foot: Secondary | ICD-10-CM | POA: Diagnosis not present

## 2021-12-31 NOTE — Therapy (Signed)
OUTPATIENT PHYSICAL THERAPY TREATMENT NOTE   Patient Name: Stacey Palmer MRN: [id number] DOB:12/02/1980, 41 y.o., female Today's Date: 12/31/2021  PCP: Merryl Hacker, No REFERRING PROVIDER: Criselda Peaches, DPM   PT End of Session - 12/31/21 1616     Visit Number 2    Number of Visits 17    Date for PT Re-Evaluation 02/11/22    Authorization Type MCE    Progress Note Due on Visit 10    PT Start Time [id number]    PT Stop Time 1657    PT Time Calculation (min) 42 min    Activity Tolerance Patient tolerated treatment well    Behavior During Therapy WFL for tasks assessed/performed             Past Medical History:  Diagnosis Date   Allergy    Anxiety    Depression, major, single episode, in partial remission ([place]) 12/26/2018   pHQ 9 SCORE OF 12 IN 12/2018, score of 0 in 03/2019 on medication   Dry eyes 12/26/2018   H/O gastric sleeve 2018   History of mammogram 2021   Obesity    Plantar fasciitis    Sleep apnea    Past Surgical History:  Procedure Laterality Date   BARIATRIC SURGERY  2018   CESAREAN SECTION     TUBAL LIGATION     Patient Active Problem List   Diagnosis Date Noted   Abdominal cramps 12/16/2020   COVID-19 virus infection 11/30/2020   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   Vaginal irritation 09/18/2020   Hip pain, right 09/16/2020   Snoring 09/16/2020   Labial skin tag 09/13/2020   Fatigue 12/06/2019   Allergies 12/06/2019   Hair loss 12/06/2019   GAD (generalized anxiety disorder) 12/26/2018   Gastroesophageal reflux disease 06/04/2017   Vitamin D deficiency 10/21/2016   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  PAIN:  Are you having pain? Yes NPRS scale: 7/10 Pain location: BIL plantar feet PAIN TYPE: burning Pain description: intermittent  Aggravating factors: prolonged standing >15 minutes, stair negotiation, prolonged standing >15 minutes Relieving factors: pain medication and rest     OBJECTIVE:    DIAGNOSTIC FINDINGS: 08/29/2022: MR Heel Left without Contrast: IMPRESSION: 1. Mild tendinosis and tenosynovitis of the peroneus longus. 2. Mild muscle edema in the abductor hallucis muscle and flexor digitorum brevis muscle concerning for muscle strain.   PATIENT SURVEYS:  FOTO 48%, predicted 67% in 14 visits   COGNITION:          Overall cognitive status: Within functional limits for tasks assessed                        SENSATION:          Light touch: Appears intact             MUSCLE LENGTH: Gastrocnemius: Soleus:   POSTURE:  Pt stands with BIL pes  planus   PALPATION: TTP to BIL plantar heel and medial arch   LE AROM/PROM:   A/PROM Right 12/17/2021 Left 12/17/2021  Ankle dorsiflexion -6/6 2/5  Ankle plantarflexion 50/50 45/45  Ankle inversion 25/45 35/50  Ankle eversion 25/45 10/35   (Blank rows = not tested)   LE MMT:   MMT Right 12/17/2021 Left 12/17/2021  Hip flexion 4/5 4/5  Hip extension 3/5 3/5  Hip abduction 3+/5 3+/5  Knee flexion 5/5 5/5  Knee extension 5/5 5/5  Ankle dorsiflexion 4/5 4/5  Ankle plantarflexion 5/5p! 5/5p!  Ankle inversion 4/5p! 4/5p!  Ankle eversion 4/5p! 4/5p!   (Blank rows = not tested)   LOWER EXTREMITY SPECIAL TESTS:  Windlass test: (+) BIL Step-down test: (+) p! On Lt Too many toes sign: (+) BIL Figure 8 swelling measure: Rt: 51cm, Lt: 52cm   FUNCTIONAL TESTS:  Squat:  75%,no pain 5xSTS: 18 seconds SLS: Lt: 4 seconds before LOB, Rt: 2 seconds before LOB DL heel raise x25: x20 before stopping due to pain/ fatigue SL heel raise x10: Rt: x3 with poor form and pain, Lt: unable to perform due to weakness/ pain   GAIT: Distance walked: 20 ft Assistive device utilized: None Level of assistance: Complete Independence Comments: Pt walks on lateral aspect of BIL feet, antalgic gait       TODAY'S TREATMENT: [facility] Adult PT Treatment:                                                DATE: 12/31/2021 Therapeutic Exercise: Heel Raises with UE support on 4" step  Seated Plantar Fascia Stretch x1 min BIL Slant board calf stretch 3 x 30" STS x 10 Long sitting ankle PF/DF RTB x 10 BIL Seated inversion/eversion RTB x 10 BIL Manual Therapy: STM/foam roll to BIL calves x 10 mins Neuromuscular re-ed: Romberg stance eyes open 2 x 30" Romberg stance eyes closed 2 x 30" Tandem stance eyes open x 30" BIL SLS x 30" BIL Self-Care: Tennis ball plantar fascia self massage x 60"    [facility] Adult PT Treatment:                                                DATE: 12/17/2021 Therapeutic Exercise: Heel Raises with UE support 2x15 Long Sitting Plantar Fascia Stretch with Towel x1 min BIL Seated Plantar Fascia Stretch x1 min BIL Gastroc Stretch on Wall x1 min BIL Manual Therapy: N/A Neuromuscular re-ed: N/A Therapeutic Activity: N/A Modalities: N/A Self Care: N/A         PATIENT EDUCATION:  Education details: Pt educated on probable underlying pathophysiology behind pain presentation, prognosis, POC, FOTO, HEP Person educated: Patient Education method: Consulting civil engineer, Demonstration, and Handouts Education comprehension: verbalized understanding and returned demonstration     HOME EXERCISE PROGRAM: Access Code: [email or web address] URL: [email or web address] Date: 12/17/2021 Prepared by: Vanessa [place]   Exercises Heel Raises with Counter Support - 1 x  daily - 7 x weekly - 3 sets - 15 reps Long Sitting Plantar Fascia Stretch with Towel - 1 x daily - 7 x weekly - 2 sets - 1-min hold Seated Plantar Fascia Stretch - 1 x daily - 7 x weekly - 2 sets - 1-min hold Gastroc Stretch on  Wall - 1 x daily - 7 x weekly - 2 sets - 1-min hold     ASSESSMENT:   CLINICAL IMPRESSION: Patient presents to therapy with moderate pain along the plantar surface of both feet. She worked this morning and was on her feet a lot. Today's session focused on stretching and strengthening bilateral calves, and stretching plantar fascia. Patient continues to benefit from skilled PT services and should be progressed as able to improve functional independence.    REHAB POTENTIAL: Good   CLINICAL DECISION MAKING: Stable/uncomplicated   EVALUATION COMPLEXITY: Low     GOALS: Goals reviewed with patient? No   SHORT TERM GOALS:   STG Name Target Date Goal status  1 Pt will report understanding and adherence to her HEP in order to promote independence in the management of her primary impairments. Baseline: HEP provided at eval 02/11/2022 INITIAL    LONG TERM GOALS:    LTG Name Target Date Goal status  1 Pt will demonstrate 10 SL heel raises BIL in order to reach into overhead cabinets at work with less limitation. Baseline: 3 on Rt, unable on Lt 02/11/2022 INITIAL  2 Pt will achieve a FOTO score of 67% in order to demonstrate improved functional ability as it relates to her foot pain. Baseline: 48% 02/11/2022 INITIAL  3 Pt will achieve BIL ankle dorsiflexion of 10 degrees in order to promote WNL gait. Baseline: -6 on Rt, 2 on Lt 02/11/2022 INITIAL  4 Pt will report ability to walk >1 hour with 0-3/10 pain in order to perform her work duties with less limitation. Baseline: >8/10 pain after 15 minutes of walking 02/11/2022 INITIAL  5 Pt will achieve BIL global LE strength of 4+/5 or greater in order to progress her independent strengthening program with less  limitation. Baseline: See MMT chart 02/11/2022 INITIAL    PLAN: PT FREQUENCY: 2x/week   PT DURATION: 8 weeks   PLANNED INTERVENTIONS: Therapeutic exercises, Therapeutic activity, Neuro Muscular re-education, Balance training, Gait training, Patient/Family education, Joint mobilization, Stair training, Dry Needling, Cryotherapy, Moist heat, Taping, Vasopneumatic device, and Manual therapy   PLAN FOR NEXT SESSION: Progress calf/ intrinsic foot strengthening and stretching    Evelene Croon, PTA 12/31/2021, 5:00 PM

## 2022-01-02 ENCOUNTER — Other Ambulatory Visit: Payer: Self-pay

## 2022-01-02 ENCOUNTER — Ambulatory Visit: Payer: No Typology Code available for payment source | Attending: Podiatry

## 2022-01-02 DIAGNOSIS — R262 Difficulty in walking, not elsewhere classified: Secondary | ICD-10-CM | POA: Insufficient documentation

## 2022-01-02 DIAGNOSIS — M6281 Muscle weakness (generalized): Secondary | ICD-10-CM | POA: Diagnosis present

## 2022-01-02 DIAGNOSIS — M79672 Pain in left foot: Secondary | ICD-10-CM | POA: Diagnosis not present

## 2022-01-02 DIAGNOSIS — M79671 Pain in right foot: Secondary | ICD-10-CM | POA: Diagnosis present

## 2022-01-02 NOTE — Therapy (Signed)
OUTPATIENT PHYSICAL THERAPY TREATMENT NOTE   Patient Name: Stacey Palmer MRN: [id number] DOB:August 15, 1981, 41 y.o., female Today's Date: 01/02/2022  PCP: Oneita Hurt, No REFERRING PROVIDER: Edwin Cap, DPM   PT End of Session - 01/02/22 1612     Visit Number 3    Number of Visits 17    Date for PT Re-Evaluation 02/11/22    Authorization Type MCE    Progress Note Due on Visit 10    PT Start Time 1615    PT Stop Time 1655    PT Time Calculation (min) 40 min    Activity Tolerance Patient tolerated treatment well    Behavior During Therapy WFL for tasks assessed/performed              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/26/2018   H/O gastric sleeve 2018   History of mammogram 2021   Obesity    Plantar fasciitis    Sleep apnea    Past Surgical History:  Procedure Laterality Date   BARIATRIC SURGERY  2018   CESAREAN SECTION     TUBAL LIGATION     Patient Active Problem List   Diagnosis Date Noted   Abdominal cramps 12/16/2020   COVID-19 virus infection 11/30/2020   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   Vaginal irritation 09/18/2020   Hip pain, right 09/16/2020   Snoring 09/16/2020   Labial skin tag 09/13/2020   Fatigue 12/06/2019   Allergies 12/06/2019   Hair loss 12/06/2019   GAD (generalized anxiety disorder) 12/26/2018   Gastroesophageal reflux disease 06/04/2017   Vitamin D deficiency 10/21/2016   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   Obesity (BMI 30-39.9) 06/11/2015   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  PAIN:  Are you having pain? Yes NPRS scale: 8/10 Pain location: BIL plantar feet PAIN TYPE: burning Pain description: intermittent  Aggravating factors: prolonged standing >15 minutes, stair negotiation, prolonged standing >15 minutes Relieving factors: pain medication and rest    OBJECTIVE:    DIAGNOSTIC FINDINGS: 08/29/2022: MR Heel Left without Contrast: IMPRESSION: 1. Mild tendinosis and tenosynovitis of the peroneus longus. 2. Mild muscle edema in the abductor hallucis muscle and flexor digitorum brevis muscle concerning for muscle strain.   PATIENT SURVEYS:  FOTO 48%, predicted 67% in 14 visits   COGNITION:          Overall cognitive status: Within functional limits for tasks assessed                        SENSATION:          Light touch: Appears intact             MUSCLE LENGTH: Gastrocnemius: Soleus:  POSTURE:  Pt stands with BIL pes planus   PALPATION: TTP to BIL plantar heel and medial arch   LE AROM/PROM:   A/PROM Right 12/17/2021 Left 12/17/2021  Ankle dorsiflexion -6/6 2/5  Ankle plantarflexion 50/50 45/45  Ankle inversion 25/45 35/50  Ankle eversion 25/45 10/35   (Blank rows = not tested)   LE MMT:   MMT Right 12/17/2021 Left 12/17/2021 Right 01/02/2022 Left 01/02/2022  Hip flexion 4/5 4/5 4/5 4/5  Hip extension 3/5 3/5    Hip abduction 3+/5 3+/5 4/5 4/5  Knee flexion 5/5 5/5    Knee extension 5/5 5/5    Ankle dorsiflexion 4/5 4/5    Ankle plantarflexion 5/5p! 5/5p!    Ankle inversion 4/5p! 4/5p!    Ankle eversion 4/5p! 4/5p!     (Blank rows = not tested)   LOWER EXTREMITY SPECIAL TESTS:  Windlass test: (+) BIL Step-down test: (+) p! On Lt Too many toes  sign: (+) BIL Figure 8 swelling measure: Rt: 51cm, Lt: 52cm   FUNCTIONAL TESTS:  Squat: 75%,no pain 5xSTS: 18 seconds SLS: Lt: 4 seconds before LOB, Rt: 2 seconds before LOB DL heel raise x25: x20 before stopping due to pain/ fatigue SL heel raise x10: Rt: x3 with poor form and pain, Lt: unable to perform due to weakness/ pain   GAIT: Distance walked: 20 ft Assistive device utilized: None Level of assistance: Complete Independence Comments: Pt walks on lateral aspect of BIL feet, antalgic gait       TODAY'S TREATMENT: [facility] Adult PT Treatment:                                                DATE: 01/02/2022 Therapeutic Exercise: Heel Raises with UE support on 4" step 2 x 10 Seated Plantar Fascia Stretch x1 min BIL Seated towel scrunches 2 x 60" Slant board calf stretch 3 x 30" STS 2 x 10 Long sitting ankle PF/DF GTB 2 x 10 BIL Long sitting inversion/eversion GTB 2 x 10 BIL Cybex hip abduction 17.5# 2 x 10 BIL Cybex hip extension 17.5# 2 x 10 BIL Neuromuscular re-ed: Romberg stance eyes open x 30" Romberg stance eyes closed 2 x 30" Tandem stance eyes open x 30" BIL Romberg stance on foam eyes open x 30" Tandem stance on foam x 30" BIL   [facility] Adult PT Treatment:                                                DATE: 12/31/2021 Therapeutic Exercise: Heel Raises with UE support on 4" step  Seated Plantar Fascia Stretch x1 min BIL Slant board calf stretch 3 x 30" STS x 10 Long sitting ankle PF/DF RTB x 10 BIL Seated inversion/eversion RTB x 10 BIL Manual Therapy: STM/foam roll to BIL calves x 10 mins Neuromuscular re-ed: Romberg stance eyes open 2 x 30" Romberg stance eyes closed 2 x 30" Tandem stance eyes open x 30" BIL SLS x 30" BIL Self-Care: Tennis ball plantar fascia self massage x 60"    [facility] Adult PT Treatment:  DATE: 12/17/2021 Therapeutic Exercise: Heel Raises with UE support 2x15 Long Sitting Plantar Fascia  Stretch with Towel x1 min BIL Seated Plantar Fascia Stretch x1 min BIL Gastroc Stretch on Wall x1 min BIL Manual Therapy: N/A Neuromuscular re-ed: N/A Therapeutic Activity: N/A Modalities: N/A Self Care: N/A         PATIENT EDUCATION:  Education details: Pt educated on probable underlying pathophysiology behind pain presentation, prognosis, POC, FOTO, HEP Person educated: Patient Education method: Consulting civil engineer, Demonstration, and Handouts Education comprehension: verbalized understanding and returned demonstration     HOME EXERCISE PROGRAM: Access Code: [email or web address] URL: [email or web address] Date: 12/17/2021 Prepared by: Vanessa [place]   Exercises Heel Raises with Counter Support - 1 x daily - 7 x weekly - 3 sets - 15 reps Long Sitting Plantar Fascia Stretch with Towel - 1 x daily - 7 x weekly - 2 sets - 1-min hold Seated Plantar Fascia Stretch - 1 x daily - 7 x weekly - 2 sets - 1-min hold Gastroc Stretch on Wall - 1 x daily - 7 x weekly - 2 sets - 1-min hold     ASSESSMENT:   CLINICAL IMPRESSION: Patient presents to PT with high pain levels but reports she is feeling better overall. Today's session focused on stretching and strengthening bilateral lower extremities and balance training. Incorporated additional hip strengthening this session with no increase in pain. Patient continues to benefit from skilled PT services and should be progressed as able to improve functional independence.    REHAB POTENTIAL: Good   CLINICAL DECISION MAKING: Stable/uncomplicated   EVALUATION COMPLEXITY: Low     GOALS: Goals reviewed with patient? No   SHORT TERM GOALS:   STG Name Target Date Goal status  1 Pt will report understanding and adherence to her HEP in order to promote independence in the management of her primary impairments. Baseline: HEP provided at eval 02/11/2022 INITIAL    LONG TERM GOALS:    LTG Name Target Date Goal status  1 Pt will  demonstrate 10 SL heel raises BIL in order to reach into overhead cabinets at work with less limitation. Baseline: 3 on Rt, unable on Lt 02/11/2022 INITIAL  2 Pt will achieve a FOTO score of 67% in order to demonstrate improved functional ability as it relates to her foot pain. Baseline: 48% 02/11/2022 INITIAL  3 Pt will achieve BIL ankle dorsiflexion of 10 degrees in order to promote WNL gait. Baseline: -6 on Rt, 2 on Lt 02/11/2022 INITIAL  4 Pt will report ability to walk >1 hour with 0-3/10 pain in order to perform her work duties with less limitation. Baseline: >8/10 pain after 15 minutes of walking 02/11/2022 INITIAL  5 Pt will achieve BIL global LE strength of 4+/5 or greater in order to progress her independent strengthening program with less limitation. Baseline: See MMT chart 02/11/2022 INITIAL    PLAN: PT FREQUENCY: 2x/week   PT DURATION: 8 weeks   PLANNED INTERVENTIONS: Therapeutic exercises, Therapeutic activity, Neuro Muscular re-education, Balance training, Gait training, Patient/Family education, Joint mobilization, Stair training, Dry Needling, Cryotherapy, Moist heat, Taping, Vasopneumatic device, and Manual therapy   PLAN FOR NEXT SESSION: Progress calf/ intrinsic foot strengthening and stretching    Evelene Croon, PTA 01/02/2022, 4:55 PM

## 2022-01-07 ENCOUNTER — Ambulatory Visit: Payer: No Typology Code available for payment source

## 2022-01-07 DIAGNOSIS — M79672 Pain in left foot: Secondary | ICD-10-CM | POA: Diagnosis not present

## 2022-01-07 DIAGNOSIS — R262 Difficulty in walking, not elsewhere classified: Secondary | ICD-10-CM

## 2022-01-07 DIAGNOSIS — M79671 Pain in right foot: Secondary | ICD-10-CM

## 2022-01-07 DIAGNOSIS — M6281 Muscle weakness (generalized): Secondary | ICD-10-CM

## 2022-01-07 NOTE — Therapy (Signed)
OUTPATIENT PHYSICAL THERAPY TREATMENT NOTE   Patient Name: Stacey Palmer MRN: [id number] DOB:03/24/1981, 41 y.o., female Today's Date: 01/07/2022  PCP: Pcp, No REFERRING PROVIDER: Edwin Cap, DPM   PT End of Session - 01/07/22 1617     Visit Number 4    Number of Visits 17    Date for PT Re-Evaluation 02/11/22    Authorization Type M[employer]    Progress Note Due on Visit 10    PT Start Time 1618    PT Stop Time 1700    PT Time Calculation (min) 42 min    Activity Tolerance Patient tolerated treatment well    Behavior During Therapy WFL for tasks assessed/performed               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/26/2018   H/O gastric sleeve 2018   History of mammogram 2021   Obesity    Plantar fasciitis    Sleep apnea    Past Surgical History:  Procedure Laterality Date   BARIATRIC SURGERY  2018   CESAREAN SECTION     TUBAL LIGATION     Patient Active Problem List   Diagnosis Date Noted   Abdominal cramps 12/16/2020   COVID-19 virus infection 11/30/2020   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   Vaginal irritation 09/18/2020   Hip pain, right 09/16/2020   Snoring 09/16/2020   Labial skin tag 09/13/2020   Fatigue 12/06/2019   Allergies 12/06/2019   Hair loss 12/06/2019   GAD (generalized anxiety disorder) 12/26/2018   Gastroesophageal reflux disease 06/04/2017   Vitamin D deficiency 10/21/2016   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   Obesity (BMI 30-39.9) 06/11/2015   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 She reports doing her HEP daily.   PAIN:  Are you having pain? Yes NPRS scale: 8/10 Pain location: BIL plantar feet PAIN TYPE: burning Pain description: intermittent  Aggravating factors: prolonged standing >15 minutes, stair negotiation, prolonged standing >15 minutes Relieving factors: pain medication and rest    OBJECTIVE:   *Unless otherwise noted, objective information collected previously*  DIAGNOSTIC FINDINGS: 08/29/2022: MR Heel Left without Contrast: IMPRESSION: 1. Mild tendinosis and tenosynovitis of the peroneus longus. 2. Mild muscle edema in the abductor hallucis muscle and flexor digitorum brevis muscle concerning for muscle strain.   PATIENT SURVEYS:  FOTO 48%, predicted 67% in 14 visits   COGNITION:          Overall cognitive status: Within functional limits for tasks assessed                        SENSATION:  Light touch: Appears intact             MUSCLE LENGTH: Gastrocnemius: Soleus:   POSTURE:  Pt stands with BIL pes planus   PALPATION: TTP to BIL plantar heel and medial arch   LE AROM/PROM:   A/PROM Right 12/17/2021 Left 12/17/2021  Ankle dorsiflexion -6/6 2/5  Ankle plantarflexion 50/50 45/45  Ankle inversion 25/45 35/50  Ankle eversion 25/45 10/35   (Blank rows = not tested)   LE MMT:   MMT Right 12/17/2021 Left 12/17/2021 Right 01/02/2022 Left 01/02/2022  Hip flexion 4/5 4/5 4/5 4/5  Hip extension 3/5 3/5    Hip abduction 3+/5 3+/5 4/5 4/5  Knee flexion 5/5 5/5    Knee extension 5/5 5/5    Ankle dorsiflexion 4/5 4/5    Ankle plantarflexion 5/5p! 5/5p!    Ankle inversion 4/5p! 4/5p!    Ankle eversion 4/5p!  4/5p!     (Blank rows = not tested)   LOWER EXTREMITY SPECIAL TESTS:  Windlass test: (+) BIL Step-down test: (+) p! On Lt Too many toes sign: (+) BIL Figure 8 swelling measure: Rt: 51cm, Lt: 52cm   FUNCTIONAL TESTS:  Squat: 75%,no pain 5xSTS: 18 seconds SLS: Lt: 4 seconds before LOB, Rt: 2 seconds before LOB DL heel raise J00: X38 before stopping due to pain/ fatigue SL heel raise x10: Rt: x3 with poor form and pain, Lt: unable to perform due to weakness/ pain   GAIT: Distance walked: 20 ft Assistive device utilized: None Level of assistance: Complete Independence Comments: Pt walks on lateral aspect of BIL feet, antalgic gait       TODAY'S TREATMENT:  [facility] Adult PT Treatment:                                                DATE: 01/07/2022 Therapeutic Exercise: Standing DL heel raises on edge of Bosu ball 3x15 Forward lunge on each side of Bosu ball 2x10 BIL on each side of ball Standing marching with slow LE lowering on Airex pad 3x20 Forward woodpeckers at wall with UE support 3x8 BIL Mini squat side steps x4 laps in // bars Standing slant board gastroc stretch x2 minutes BIL Manual Therapy: N/A Neuromuscular re-ed: N/A Therapeutic Activity: N/A Modalities: N/A Self Care: N/A   [facility] Adult PT Treatment:                                                DATE: 01/02/2022 Therapeutic Exercise: Heel Raises with UE support on 4" step 2 x 10 Seated Plantar Fascia Stretch x1 min BIL Seated towel scrunches 2 x 60" Slant board calf stretch 3 x 30" STS 2 x 10 Long sitting ankle PF/DF GTB 2 x 10 BIL Long sitting inversion/eversion GTB 2 x 10 BIL Cybex hip abduction 17.5# 2 x 10 BIL Cybex hip extension 17.5# 2 x 10 BIL Neuromuscular re-ed: Romberg stance eyes open x 30" Romberg stance eyes closed 2 x 30" Tandem stance eyes open x 30" BIL Romberg stance on foam eyes open x 30" Tandem stance on foam x 30" BIL   [facility] Adult PT Treatment:  DATE: 12/31/2021 Therapeutic Exercise: Heel Raises with UE support on 4" step  Seated Plantar Fascia Stretch x1 min BIL Slant board calf stretch 3 x 30" STS x 10 Long sitting ankle PF/DF RTB x 10 BIL Seated inversion/eversion RTB x 10 BIL Manual Therapy: STM/foam roll to BIL calves x 10 mins Neuromuscular re-ed: Romberg stance eyes open 2 x 30" Romberg stance eyes closed 2 x 30" Tandem stance eyes open x 30" BIL SLS x 30" BIL Self-Care: Tennis ball plantar fascia self massage x 60"          PATIENT EDUCATION:  Education details: Pt educated on importance of continued HEP adherence Person educated: Patient Education method: Explanation Education comprehension: verbalized understanding     HOME EXERCISE PROGRAM: Access Code: [email or web address] URL: [email or web address] Date: 12/17/2021 Prepared by: Carmelina Dane   Exercises Heel Raises with Counter Support - 1 x daily - 7 x weekly - 3 sets - 15 reps Long Sitting Plantar Fascia Stretch with Towel - 1 x daily - 7 x weekly - 2 sets - 1-min hold Seated Plantar Fascia Stretch - 1 x daily - 7 x weekly - 2 sets - 1-min hold Gastroc Stretch on Wall - 1 x daily - 7 x weekly - 2 sets - 1-min hold     ASSESSMENT:   CLINICAL IMPRESSION: Pt responded well to all interventions today, demonstrating good form and no increase in pain with selected exercises. She continues to show functional progress in her ability to perform progressed calf/ intrinsic foot strengthening. She will continue to benefit from skilled PT to address her primary impairments and return to her prior level of function with less limitation.    REHAB POTENTIAL: Good   CLINICAL DECISION MAKING: Stable/uncomplicated   EVALUATION COMPLEXITY: Low     GOALS: Goals reviewed with patient? No   SHORT TERM GOALS:   STG Name Target Date Goal status  1 Pt will report understanding and adherence to her HEP in order to promote independence in the  management of her primary impairments. Baseline: HEP provided at eval 02/11/2022 INITIAL    LONG TERM GOALS:    LTG Name Target Date Goal status  1 Pt will demonstrate 10 SL heel raises BIL in order to reach into overhead cabinets at work with less limitation. Baseline: 3 on Rt, unable on Lt 02/11/2022 INITIAL  2 Pt will achieve a FOTO score of 67% in order to demonstrate improved functional ability as it relates to her foot pain. Baseline: 48% 02/11/2022 INITIAL  3 Pt will achieve BIL ankle dorsiflexion of 10 degrees in order to promote WNL gait. Baseline: -6 on Rt, 2 on Lt 02/11/2022 INITIAL  4 Pt will report ability to walk >1 hour with 0-3/10 pain in order to perform her work duties with less limitation. Baseline: >8/10 pain after 15 minutes of walking 02/11/2022 INITIAL  5 Pt will achieve BIL global LE strength of 4+/5 or greater in order to progress her independent strengthening program with less limitation. Baseline: See MMT chart 02/11/2022 INITIAL    PLAN: PT FREQUENCY: 2x/week   PT DURATION: 8 weeks   PLANNED INTERVENTIONS: Therapeutic exercises, Therapeutic activity, Neuro Muscular re-education, Balance training, Gait training, Patient/Family education, Joint mobilization, Stair training, Dry Needling, Cryotherapy, Moist heat, Taping, Vasopneumatic device, and Manual therapy   PLAN FOR NEXT SESSION: Progress calf/ intrinsic foot strengthening and stretching    Carmelina Dane, PT, DPT 01/07/22 5:00 PM

## 2022-01-08 NOTE — Therapy (Incomplete)
?OUTPATIENT PHYSICAL THERAPY TREATMENT NOTE ? ? ?Patient Name: Stacey Palmer ?MRN: [id number] ?DOB:1981/02/06, 41 y.o., female ?Today's Date: 01/08/2022 ? ?PCP: Pcp, No ?REFERRING PROVIDER: Edwin Cap, DPM ? ? ? ? ? ? ?Past Medical History:  ?Diagnosis Date  ? Allergy   ? Anxiety   ? 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 pHQ 9 SCORE OF 12 IN 12/2018, score of 0 in 03/2019 on medication  ? Dry eyes 12/26/2018  ? H/O gastric sleeve 2018  ? History of mammogram 2021  ? Obesity   ? Plantar fasciitis   ? Sleep apnea   ? ?Past Surgical History:  ?Procedure Laterality Date  ? BARIATRIC SURGERY  2018  ? CESAREAN SECTION    ? TUBAL LIGATION    ? ?Patient Active Problem List  ? Diagnosis Date Noted  ? Abdominal cramps 12/16/2020  ? COVID-19 virus infection 11/30/2020  ? 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 Vaginal irritation 09/18/2020  ? Hip pain, right 09/16/2020  ? Snoring 09/16/2020  ? Labial skin tag 09/13/2020  ? Fatigue 12/06/2019  ? Allergies 12/06/2019  ? Hair loss 12/06/2019  ? GAD (generalized anxiety disorder) 12/26/2018  ? Gastroesophageal reflux disease 06/04/2017  ? Vitamin D deficiency 10/21/2016  ? 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 Obesity (BMI 30-39.9) 06/11/2015  ? 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 Skin tag 06/05/2014  ? Allergic rhinitis 06/05/2014  ? URI (upper respiratory infection) 08/23/2012  ? Low back pain 04/04/2010  ? Morbid obesity (HCC) 02/10/2008  ? ? ?REFERRING DIAG: : M72.2 (ICD-10-CM) - Plantar fasciitis ? ?THERAPY DIAG:  ?No diagnosis found. ? ?PERTINENT HISTORY: N/A ? ?PRECAUTIONS: None ? ?ONSET DATE: 01/2021 ? ?SUBJECTIVE: *** ? ?PAIN:  ?Are you having pain? Yes ?NPRS scale: 8/10 ?Pain location: BIL plantar feet ?PAIN TYPE: burning ?Pain description: intermittent  ?Aggravating factors: prolonged standing >15 minutes, stair negotiation, prolonged standing >15 minutes ?Relieving factors: pain medication and rest ? ? ? ?OBJECTIVE:  ?  *Unless otherwise noted, objective information collected previously* ? ?DIAGNOSTIC FINDINGS: 08/29/2022: MR Heel Left without Contrast: IMPRESSION: ?1. Mild tendinosis and tenosynovitis of the peroneus longus. ?2. Mild muscle edema in the abductor hallucis muscle and flexor ?digitorum brevis muscle concerning for muscle strain. ?  ?PATIENT SURVEYS:  ?FOTO 48%, predicted 67% in 14 visits ?  ?COGNITION: ?         Overall cognitive status: Within functional limits for tasks assessed              ?          ?SENSATION: ?         Light touch: Appears intact ?          ?  ?MUSCLE LENGTH: ?Gastrocnemius: ?Soleus: ?  ?POSTURE:  ?Pt stands with BIL pes planus ?  ?PALPATION: ?TTP to BIL plantar heel and medial arch ?  ?LE AROM/PROM: ?  ?A/PROM Right ?12/17/2021 Left ?12/17/2021  ?Ankle dorsiflexion -6/6 2/5  ?Ankle plantarflexion 50/50 45/45  ?Ankle inversion 25/45 35/50  ?Ankle eversion 25/45 10/35  ? (Blank rows = not tested) ?  ?LE MMT: ?  ?MMT Right ?12/17/2021 Left ?12/17/2021 Right ?01/02/2022 Left ?01/02/2022  ?Hip flexion 4/5 4/5 4/5 4/5  ?Hip extension 3/5 3/5    ?Hip abduction 3+/5 3+/5 4/5 4/5  ?Knee flexion 5/5 5/5    ?Knee extension 5/5 5/5    ?Ankle dorsiflexion 4/5 4/5    ?Ankle plantarflexion 5/5p! 5/5p!    ?Ankle  inversion 4/5p! 4/5p!    ?Ankle eversion 4/5p! 4/5p!    ? (Blank rows = not tested) ?  ?LOWER EXTREMITY SPECIAL TESTS:  ?Windlass test: (+) BIL ?Step-down test: (+) p! On Lt ?Too many toes sign: (+) BIL ?Figure 8 swelling measure: Rt: 51cm, Lt: 52cm ?  ?FUNCTIONAL TESTS:  ?Squat: 75%,no pain ?5xSTS: 18 seconds ?SLS: Lt: 4 seconds before LOB, Rt: 2 seconds before LOB ?DL heel raise Q73: A19 before stopping due to pain/ fatigue ?SL heel raise x10: Rt: x3 with poor form and pain, Lt: unable to perform due to weakness/ pain ?  ?GAIT: ?Distance walked: 20 ft ?Assistive device utilized: None ?Level of assistance: Complete Independence ?Comments: Pt walks on lateral aspect of BIL feet, antalgic gait ?  ?  ?   ?TODAY'S TREATMENT: ? ?[facility] Adult PT Treatment:                                                DATE: 01/09/2022 ?Therapeutic Exercise: ?*** ?Manual Therapy: ?*** ?Neuromuscular re-ed: ?*** ?Therapeutic Activity: ?*** ?Modalities: ?*** ?Self Care: ?*** ? ? ?[facility] Adult PT Treatment:                                                DATE: 01/07/2022 ?Therapeutic Exercise: ?Standing DL heel raises on edge of Bosu ball 3x15 ?Forward lunge on each side of Bosu ball 2x10 BIL on each side of ball ?Standing marching with slow LE lowering on Airex pad 3x20 ?Forward woodpeckers at wall with UE support 3x8 BIL ?Mini squat side steps x4 laps in // bars ?Standing slant board gastroc stretch x2 minutes BIL ?Manual Therapy: ?N/A ?Neuromuscular re-ed: ?N/A ?Therapeutic Activity: ?N/A ?Modalities: ?N/A ?Self Care: ?N/A ? ? ?[facility] Adult PT Treatment:                                                DATE: 01/02/2022 ?Therapeutic Exercise: ?Heel Raises with UE support on 4" step 2 x 10 ?Seated Plantar Fascia Stretch x1 min BIL ?Seated towel scrunches 2 x 60" ?Slant board calf stretch 3 x 30" ?STS 2 x 10 ?Long sitting ankle PF/DF GTB 2 x 10 BIL ?Long sitting inversion/eversion GTB 2 x 10 BIL ?Cybex hip abduction 17.5# 2 x 10 BIL ?Cybex hip extension 17.5# 2 x 10 BIL ?Neuromuscular re-ed: ?Romberg stance eyes open x 30" ?Romberg stance eyes closed 2 x 30" ?Tandem stance eyes open x 30" BIL ?Romberg stance on foam eyes open x 30" ?Tandem stance on foam x 30" BIL ? ?  ?  ?  ?  ?PATIENT EDUCATION:  ?Education details: Pt educated on importance of continued HEP adherence ?Person educated: Patient ?Education method: Explanation ?Education comprehension: verbalized understanding ?  ?  ?HOME EXERCISE PROGRAM: ?Access Code: [email or web address] ?URL: [email or web address] ?Date: 12/17/2021 ?Prepared by: Carmelina Dane ?  ?Exercises ?Heel Raises with Counter Support - 1 x daily - 7 x weekly - 3 sets - 15 reps ?Long Sitting Plantar Fascia Stretch with  Towel - 1 x daily - 7 x weekly - 2 sets - 1-min hold ?Seated Plantar Fascia  Stretch - 1 x daily - 7 x weekly - 2 sets - 1-min hold ?Gastroc Stretch on Wall - 1 x daily - 7 x weekly - 2 sets - 1-min hold ?  ?  ?ASSESSMENT: ?  ?CLINICAL IMPRESSION: ?*** ? ?  ?REHAB POTENTIAL: Good ?  ?CLINICAL DECISION MAKING: Stable/uncomplicated ?  ?EVALUATION COMPLEXITY: Low ?  ?  ?GOALS: ?Goals reviewed with patient? No ?  ?SHORT TERM GOALS: ?  ?STG Name Target Date Goal status  ?1 Pt will report understanding and adherence to her HEP in order to promote independence in the management of her primary impairments. ?Baseline: HEP provided at eval 02/11/2022 INITIAL  ?  ?LONG TERM GOALS:  ?  ?LTG Name Target Date Goal status  ?1 Pt will demonstrate 10 SL heel raises BIL in order to reach into overhead cabinets at work with less limitation. ?Baseline: 3 on Rt, unable on Lt 02/11/2022 INITIAL  ?2 Pt will achieve a FOTO score of 67% in order to demonstrate improved functional ability as it relates to her foot pain. ?Baseline: 48% 02/11/2022 INITIAL  ?3 Pt will achieve BIL ankle dorsiflexion of 10 degrees in order to promote WNL gait. ?Baseline: -6 on Rt, 2 on Lt 02/11/2022 INITIAL  ?4 Pt will report ability to walk >1 hour with 0-3/10 pain in order to perform her work duties with less limitation. ?Baseline: >8/10 pain after 15 minutes of walking 02/11/2022 INITIAL  ?5 Pt will achieve BIL global LE strength of 4+/5 or greater in order to progress her independent strengthening program with less limitation. ?Baseline: See MMT chart 02/11/2022 INITIAL  ?  ?PLAN: ?PT FREQUENCY: 2x/week ?  ?PT DURATION: 8 weeks ?  ?PLANNED INTERVENTIONS: Therapeutic exercises, Therapeutic activity, Neuro Muscular re-education, Balance training, Gait training, Patient/Family education, Joint mobilization, Stair training, Dry Needling, Cryotherapy, Moist heat, Taping, Vasopneumatic device, and Manual therapy ?  ?PLAN FOR NEXT SESSION: Progress calf/ intrinsic foot  strengthening and stretching ? ? ? ?Carmelina DaneYarborough, , PT, DPT ?01/08/22 9:31 AM ? ? ?  ? ?

## 2022-01-09 ENCOUNTER — Ambulatory Visit: Payer: No Typology Code available for payment source

## 2022-01-14 ENCOUNTER — Ambulatory Visit: Payer: No Typology Code available for payment source

## 2022-01-14 ENCOUNTER — Other Ambulatory Visit: Payer: Self-pay

## 2022-01-14 DIAGNOSIS — M6281 Muscle weakness (generalized): Secondary | ICD-10-CM

## 2022-01-14 DIAGNOSIS — M79671 Pain in right foot: Secondary | ICD-10-CM

## 2022-01-14 DIAGNOSIS — R262 Difficulty in walking, not elsewhere classified: Secondary | ICD-10-CM

## 2022-01-14 DIAGNOSIS — M79672 Pain in left foot: Secondary | ICD-10-CM

## 2022-01-14 NOTE — Therapy (Signed)
?OUTPATIENT PHYSICAL THERAPY TREATMENT NOTE ? ? ?Patient Name: Stacey Palmer ?MRN: [id number] ?DOB:06/17/1981, 41 y.o., female ?Today's Date: 01/14/2022 ? ?PCP: Pcp, No ?REFERRING PROVIDER: Criselda Peaches, DPM ? ? PT End of Session - 01/14/22 1617   ? ? Visit Number 5   ? Number of Visits 17   ? Date for PT Re-Evaluation 02/11/22   ? Authorization Type MCE   ? Progress Note Due on Visit 10   ? PT Start Time 1620   ? PT Stop Time 1700   ? PT Time Calculation (min) 40 min   ? Activity Tolerance Patient tolerated treatment well   ? Behavior During Therapy [facility] for tasks assessed/performed   ? ?  ?  ? ?  ? ? ? ? ? ?Past Medical History:  ?Diagnosis Date  ? Allergy   ? Anxiety   ? Depression, major, single episode, in partial remission ([place]) 12/26/2018  ? pHQ 9 SCORE OF 12 IN 12/2018, score of 0 in 03/2019 on medication  ? Dry eyes 12/26/2018  ? H/O gastric sleeve 2018  ? History of mammogram 2021  ? Obesity   ? Plantar fasciitis   ? Sleep apnea   ? ?Past Surgical History:  ?Procedure Laterality Date  ? BARIATRIC SURGERY  2018  ? CESAREAN SECTION    ? TUBAL LIGATION    ? ?Patient Active Problem List  ? Diagnosis Date Noted  ? Abdominal cramps 12/16/2020  ? COVID-19 virus infection 11/30/2020  ? 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 Vaginal irritation 09/18/2020  ? Hip pain, right 09/16/2020  ? Snoring 09/16/2020  ? Labial skin tag 09/13/2020  ? Fatigue 12/06/2019  ? Allergies 12/06/2019  ? Hair loss 12/06/2019  ? GAD (generalized anxiety disorder) 12/26/2018  ? Gastroesophageal reflux disease 06/04/2017  ? Vitamin D deficiency 10/21/2016  ? 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 Obesity (BMI 30-39.9) 06/11/2015  ? 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 Skin tag 06/05/2014  ? Allergic rhinitis 06/05/2014  ? URI (upper respiratory infection) 08/23/2012  ? Low back pain 04/04/2010  ? Morbid obesity ([place]) 02/10/2008  ? ? ?REFERRING DIAG: : M72.2 (ICD-10-CM) - Plantar fasciitis ? ?THERAPY DIAG:  ?Pain in left foot ? ?Muscle  weakness (generalized) ? ?Difficulty in walking, not elsewhere classified ? ?Pain in right foot ? ?PERTINENT HISTORY: N/A ? ?PRECAUTIONS: None ? ?ONSET DATE: 01/2021 ? ?SUBJECTIVE: Pt reports continued burning pain in BIL plantar feet that was worse this morning than currently. She adds that she has been doing her HEP every other day.  ? ?PAIN:  ?Are you having pain? Yes ?NPRS scale: 6/10 ?Pain location: BIL plantar feet ?PAIN TYPE: burning ?Pain description: intermittent  ?Aggravating factors: prolonged standing >15 minutes, stair negotiation, prolonged standing >15 minutes ?Relieving factors: pain medication and rest ? ? ? ?OBJECTIVE:  ? *Unless otherwise noted, objective information collected previously* ? ?DIAGNOSTIC FINDINGS: 08/29/2022: MR Heel Left without Contrast: IMPRESSION: ?1. Mild tendinosis and tenosynovitis of the peroneus longus. ?2. Mild muscle edema in the abductor hallucis muscle and flexor ?digitorum brevis muscle concerning for muscle strain. ?  ?PATIENT SURVEYS:  ?FOTO 48%, predicted 67% in 14 visits ?  ?COGNITION: ?         Overall cognitive status: Within functional limits for tasks assessed              ?          ?SENSATION: ?         Light touch: Appears  intact ?          ?  ?MUSCLE LENGTH: ?Gastrocnemius: ?Soleus: ?  ?POSTURE:  ?Pt stands with BIL pes planus ?  ?PALPATION: ?TTP to BIL plantar heel and medial arch ? ?01/14/2022: Additionally TTP to BIL metatarsal heads ?  ?LE AROM/PROM: ?  ?A/PROM Right ?12/17/2021 Left ?12/17/2021 Right ?01/14/2022 Left ?01/14/2022  ?Ankle dorsiflexion -6/6 2/5 3/10 2/5  ?Ankle plantarflexion 50/50 45/45    ?Ankle inversion 25/45 35/50    ?Ankle eversion 25/45 10/35    ? (Blank rows = not tested) ?  ?LE MMT: ?  ?MMT Right ?12/17/2021 Left ?12/17/2021 Right ?01/02/2022 Left ?01/02/2022  ?Hip flexion 4/5 4/5 4/5 4/5  ?Hip extension 3/5 3/5    ?Hip abduction 3+/5 3+/5 4/5 4/5  ?Knee flexion 5/5 5/5    ?Knee extension 5/5 5/5    ?Ankle dorsiflexion 4/5 4/5    ?Ankle  plantarflexion 5/5p! 5/5p!    ?Ankle inversion 4/5p! 4/5p!    ?Ankle eversion 4/5p! 4/5p!    ? (Blank rows = not tested) ?  ?LOWER EXTREMITY SPECIAL TESTS:  ?Windlass test: (+) BIL ?Step-down test: (+) p! On Lt ?Too many toes sign: (+) BIL ?Figure 8 swelling measure: Rt: 51cm, Lt: 52cm ?  ?FUNCTIONAL TESTS:  ?Squat: 75%,no pain ?5xSTS: 18 seconds ?SLS: Lt: 4 seconds before LOB, Rt: 2 seconds before LOB ?DL heel raise x25: x20 before stopping due to pain/ fatigue ?SL heel raise x10: Rt: x3 with poor form and pain, Lt: unable to perform due to weakness/ pain ?  ?  01/14/2022: SL heel raise x10: WNL BIL ?GAIT: ?Distance walked: 20 ft ?Assistive device utilized: None ?Level of assistance: Complete Independence ?Comments: Pt walks on lateral aspect of BIL feet, antalgic gait ?  ?  ?  ?TODAY'S TREATMENT: ? ?[place] [facility] PT Treatment:                                                DATE: 01/09/2022 ?Therapeutic Exercise: ?Heel on Airex pad with ball of food on ground, SLS while passing 5# kettlebell from side to side 3x20 BIL ?Single leg heel raises 3x10 BIL with UE support ?Forward lunge on each side of Bosu ball 2x10 BIL on each side of ball ?Forward woodpeckers at wall with UE support 3x10 BIL ?Manual Therapy: ?N/A ?Neuromuscular re-ed: ?N/A ?Therapeutic Activity: ?Re-assessment of objective measures with education for pt ?Education regarding potential benefits of a metatarsal bar and medial arch support orthotic, along with wear time instructions ?Modalities: ?N/A ?Self Care: ?N/A ? ? ?[place] Adult PT Treatment:                                                DATE: 01/07/2022 ?Therapeutic Exercise: ?Standing DL heel raises on edge of Bosu ball 3x15 ?Forward lunge on each side of Bosu ball 2x10 BIL on each side of ball ?Standing marching with slow LE lowering on Airex pad 3x20 ?Forward woodpeckers at wall with UE support 3x8 BIL ?Mini squat side steps x4 laps in // bars ?Standing slant board gastroc stretch x2 minutes BIL ?Manual  Therapy: ?N/A ?Neuromuscular re-ed: ?N/A ?Therapeutic Activity: ?N/A ?Modalities: ?N/A ?Self Care: ?N/A ? ? ?[place] Adult PT Treatment:  DATE: 01/02/2022 ?Therapeutic Exercise: ?Heel Raises with UE support on 4" step 2 x 10 ?Seated Plantar Fascia Stretch x1 min BIL ?Seated towel scrunches 2 x 60" ?Slant board calf stretch 3 x 30" ?STS 2 x 10 ?Long sitting ankle PF/DF GTB 2 x 10 BIL ?Long sitting inversion/eversion GTB 2 x 10 BIL ?Cybex hip abduction 17.5# 2 x 10 BIL ?Cybex hip extension 17.5# 2 x 10 BIL ?Neuromuscular re-ed: ?Romberg stance eyes open x 30" ?Romberg stance eyes closed 2 x 30" ?Tandem stance eyes open x 30" BIL ?Romberg stance on foam eyes open x 30" ?Tandem stance on foam x 30" BIL ? ?  ?  ?  ?  ?PATIENT EDUCATION:  ?Education details: Updated HEP ?Person educated: Patient ?Education method: Explanation ?Education comprehension: verbalized understanding ?  ?  ?HOME EXERCISE PROGRAM: ?Access Code: [email or web address] ?URL: [email or web address] ?Date: 12/17/2021 ?Prepared by: Vanessa Lane ?  ?Exercises ?Heel Raises with Counter Support - 1 x daily - 7 x weekly - 3 sets - 15 reps ?Long Sitting Plantar Fascia Stretch with Towel - 1 x daily - 7 x weekly - 2 sets - 1-min hold ?Seated Plantar Fascia Stretch - 1 x daily - 7 x weekly - 2 sets - 1-min hold ?Gastroc Stretch on Wall - 1 x daily - 7 x weekly - 2 sets - 1-min hold ? ?Added 01/14/2022: ?Single Leg Heel Raise on Step - 1 x daily - 7 x weekly - 3 sets - 10 reps - 3-sec hold ?  ?  ?ASSESSMENT: ?  ?CLINICAL IMPRESSION: ?Pt responded well to all interventions today, demonstrating good form and no increase in pain with selected exercises. Of note, the pt reports additional pain on her metatarsal heads. The PT advised the pt to consider purchasing a metatarsal bar for this problem in additional to a medial arch support. Also discussed wear times with pt for orthotics. Upon re-assessment of ankle ROM and SL  heel raises, the pt has made excellent progress in these measures. The pt will benefit from skilled PT to address her primary impairments and return to her prior level of function with less limitation.

## 2022-01-15 NOTE — Therapy (Signed)
?OUTPATIENT PHYSICAL THERAPY TREATMENT NOTE ? ? ?Patient Name: Stacey Palmer ?MRN: [id number] ?DOB:1981/01/20, 41 y.o., female ?Today's Date: 01/16/2022 ? ?PCP: Pcp, No ?REFERRING PROVIDER: Criselda Peaches, DPM ? ? PT End of Session - 01/16/22 1557   ? ? Visit Number 6   ? Number of Visits 17   ? Date for PT Re-Evaluation 02/11/22   ? Authorization Type MCE   ? Progress Note Due on Visit 10   ? PT Start Time 1600   ? PT Stop Time 1650   10 minutes vaopneumatic treatment  ? PT Time Calculation (min) 50 min   ? Activity Tolerance Patient tolerated treatment well   ? Behavior During Therapy [facility] for tasks assessed/performed   ? ?  ?  ? ?  ? ? ? ? ? ? ?Past Medical History:  ?Diagnosis Date  ? Allergy   ? Anxiety   ? Depression, major, single episode, in partial remission ([place]) 12/26/2018  ? pHQ 9 SCORE OF 12 IN 12/2018, score of 0 in 03/2019 on medication  ? Dry eyes 12/26/2018  ? H/O gastric sleeve 2018  ? History of mammogram 2021  ? Obesity   ? Plantar fasciitis   ? Sleep apnea   ? ?Past Surgical History:  ?Procedure Laterality Date  ? BARIATRIC SURGERY  2018  ? CESAREAN SECTION    ? TUBAL LIGATION    ? ?Patient Active Problem List  ? Diagnosis Date Noted  ? Abdominal cramps 12/16/2020  ? COVID-19 virus infection 11/30/2020  ? 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 Vaginal irritation 09/18/2020  ? Hip pain, right 09/16/2020  ? Snoring 09/16/2020  ? Labial skin tag 09/13/2020  ? Fatigue 12/06/2019  ? Allergies 12/06/2019  ? Hair loss 12/06/2019  ? GAD (generalized anxiety disorder) 12/26/2018  ? Gastroesophageal reflux disease 06/04/2017  ? Vitamin D deficiency 10/21/2016  ? 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 Obesity (BMI 30-39.9) 06/11/2015  ? 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 Skin tag 06/05/2014  ? Allergic rhinitis 06/05/2014  ? URI (upper respiratory infection) 08/23/2012  ? Low back pain 04/04/2010  ? Morbid obesity ([place]) 02/10/2008  ? ? ?REFERRING DIAG: : M72.2 (ICD-10-CM) - Plantar  fasciitis ? ?THERAPY DIAG:  ?Pain in left foot ? ?Muscle weakness (generalized) ? ?Difficulty in walking, not elsewhere classified ? ?Pain in right foot ? ?PERTINENT HISTORY: N/A ? ?PRECAUTIONS: None ? ?ONSET DATE: 01/2021 ? ?SUBJECTIVE: Pt reports 9/10 Lt medial ankle pattern in the posterior tibialis distribution. She reports that the pain started yesterday as burning in the foot and transferred to the medial ankle. She reports doing her HEP daily. ? ?PAIN:  ?Are you having pain? Yes ?NPRS scale: 9/10 ?Pain location: Lt medial ankle ?PAIN TYPE: burning ?Pain description: intermittent  ?Aggravating factors: prolonged standing >15 minutes, stair negotiation, prolonged standing >15 minutes ?Relieving factors: pain medication and rest ? ? ? ?OBJECTIVE:  ? *Unless otherwise noted, objective information collected previously* ? ?DIAGNOSTIC FINDINGS: 08/29/2022: MR Heel Left without Contrast: IMPRESSION: ?1. Mild tendinosis and tenosynovitis of the peroneus longus. ?2. Mild muscle edema in the abductor hallucis muscle and flexor ?digitorum brevis muscle concerning for muscle strain. ?  ?PATIENT SURVEYS:  ?FOTO 48%, predicted 67% in 14 visits ?01/16/2022: 67% (GOAL MET) ?  ?COGNITION: ?         Overall cognitive status: Within functional limits for tasks assessed              ?          ?  SENSATION: ?         Light touch: Appears intact ?          ?  ?MUSCLE LENGTH: ?Gastrocnemius: ?Soleus: ?  ?POSTURE:  ?Pt stands with BIL pes planus ?  ?PALPATION: ?TTP to BIL plantar heel and medial arch ? ?01/14/2022: Additionally TTP to BIL metatarsal heads ?  ?LE AROM/PROM: ?  ?A/PROM Right ?12/17/2021 Left ?12/17/2021 Right ?01/14/2022 Left ?01/14/2022  ?Ankle dorsiflexion -6/6 2/5 3/10 2/5  ?Ankle plantarflexion 50/50 45/45    ?Ankle inversion 25/45 35/50    ?Ankle eversion 25/45 10/35    ? (Blank rows = not tested) ?  ?LE MMT: ?  ?MMT Right ?12/17/2021 Left ?12/17/2021 Right ?01/02/2022 Left ?01/02/2022  ?Hip flexion 4/5 4/5 4/5 4/5  ?Hip  extension 3/5 3/5    ?Hip abduction 3+/5 3+/5 4/5 4/5  ?Knee flexion 5/5 5/5    ?Knee extension 5/5 5/5    ?Ankle dorsiflexion 4/5 4/5    ?Ankle plantarflexion 5/5p! 5/5p!    ?Ankle inversion 4/5p! 4/5p!    ?Ankle eversion 4/5p! 4/5p!    ? (Blank rows = not tested) ?  ?LOWER EXTREMITY SPECIAL TESTS:  ?Windlass test: (+) BIL ?Step-down test: (+) p! On Lt ?Too many toes sign: (+) BIL ?Figure 8 swelling measure: Rt: 51cm, Lt: 52cm ?  ?FUNCTIONAL TESTS:  ?Squat: 75%,no pain ?5xSTS: 18 seconds ?SLS: Lt: 4 seconds before LOB, Rt: 2 seconds before LOB ?DL heel raise x25: x20 before stopping due to pain/ fatigue ?SL heel raise x10: Rt: x3 with poor form and pain, Lt: unable to perform due to weakness/ pain ?  ?  01/14/2022: SL heel raise x10: WNL BIL ?GAIT: ?Distance walked: 20 ft ?Assistive device utilized: None ?Level of assistance: Complete Independence ?Comments: Pt walks on lateral aspect of BIL feet, antalgic gait ?  ?  ?  ?TODAY'S TREATMENT: ? ?[facility] Adult PT Treatment:                                                DATE: 01/16/2022 ?Therapeutic Exercise: ?Long-sitting plantar fascia stretch with sheet x2 min BIL ?Seated level 3 BAPS rotations without letting edge of board touch ground, pt stabilizing knee with hands 2x10 clockwise and counter-clockwise BIL ?Seated heel-toe ankle rolls on each side of BOSU ball (into inversion and eversion) 2x20 BIL ?Seated ankle ball squeeze and heel raise 3x15 ?Standing gastrocnemius stretch x2 minutes ?Manual Therapy: ?N/A ?Neuromuscular re-ed: ?N/A ?Therapeutic Activity: ?FOTO re-administration ?Modalities: ?GameReady vasopneumatic treatment at 34 degrees farenheit to Lt ankle with ankles elevated x10 minutes with no adverse response  ?Self Care: ?N/A ? ? ?[facility] Adult PT Treatment:                                                DATE: 01/09/2022 ?Therapeutic Exercise: ?Heel on Airex pad with ball of food on ground, SLS while passing 5# kettlebell from side to side 3x20 BIL ?Single leg  heel raises 3x10 BIL with UE support ?Forward lunge on each side of Bosu ball 2x10 BIL on each side of ball ?Forward woodpeckers at wall with UE support 3x10 BIL ?Manual Therapy: ?N/A ?Neuromuscular re-ed: ?N/A ?Therapeutic Activity: ?Re-assessment of objective measures with education for pt ?  Education regarding potential benefits of a metatarsal bar and medial arch support orthotic, along with wear time instructions ?Modalities: ?N/A ?Self Care: ?N/A ? ? ?[facility] Adult PT Treatment:                                                DATE: 01/07/2022 ?Therapeutic Exercise: ?Standing DL heel raises on edge of Bosu ball 3x15 ?Forward lunge on each side of Bosu ball 2x10 BIL on each side of ball ?Standing marching with slow LE lowering on Airex pad 3x20 ?Forward woodpeckers at wall with UE support 3x8 BIL ?Mini squat side steps x4 laps in // bars ?Standing slant board gastroc stretch x2 minutes BIL ?Manual Therapy: ?N/A ?Neuromuscular re-ed: ?N/A ?Therapeutic Activity: ?N/A ?Modalities: ?N/A ?Self Care: ?N/A ?  ?  ?  ?  ?PATIENT EDUCATION:  ?Education details: Updated HEP ?Person educated: Patient ?Education method: Explanation ?Education comprehension: verbalized understanding ?  ?  ?HOME EXERCISE PROGRAM: ?Access Code: [email or web address] ?URL: [email or web address] ?Date: 12/17/2021 ?Prepared by: Vanessa [place] ?  ?Exercises ?Heel Raises with Counter Support - 1 x daily - 7 x weekly - 3 sets - 15 reps ?Long Sitting Plantar Fascia Stretch with Towel - 1 x daily - 7 x weekly - 2 sets - 1-min hold ?Seated Plantar Fascia Stretch - 1 x daily - 7 x weekly - 2 sets - 1-min hold ?Gastroc Stretch on Wall - 1 x daily - 7 x weekly - 2 sets - 1-min hold ? ?Added 01/14/2022: ?Single Leg Heel Raise on Step - 1 x daily - 7 x weekly - 3 sets - 10 reps - 3-sec hold ?  ?  ?ASSESSMENT: ?  ?CLINICAL IMPRESSION: ?Pt reports feeling exacerbated Lt medial ankle pain today which she reports started insidiously yesterday. Due to increased pain  presentation, treatment today focused primarily on gentle stretching and mobility exercises. She responded well to all interventions today. Additionally, re-administration of FOTO revealed a dramatic improvement in her functi

## 2022-01-16 ENCOUNTER — Other Ambulatory Visit: Payer: Self-pay

## 2022-01-16 ENCOUNTER — Ambulatory Visit: Payer: No Typology Code available for payment source

## 2022-01-16 DIAGNOSIS — M79672 Pain in left foot: Secondary | ICD-10-CM | POA: Diagnosis not present

## 2022-01-18 NOTE — Therapy (Incomplete)
?OUTPATIENT PHYSICAL THERAPY TREATMENT NOTE ? ? ?Patient Name: Stacey Palmer ?MRN: [id number] ?DOB:09/06/1981, 41 y.o., female ?Today's Date: 01/18/2022 ? ?PCP: Pcp, No ?REFERRING PROVIDER: No ref. provider found ? ? ? ? ? ? ? ? ?Past Medical History:  ?Diagnosis Date  ? Allergy   ? Anxiety   ? Depression, major, single episode, in partial remission ([place]) 12/26/2018  ? pHQ 9 SCORE OF 12 IN 12/2018, score of 0 in 03/2019 on medication  ? Dry eyes 12/26/2018  ? H/O gastric sleeve 2018  ? History of mammogram 2021  ? Obesity   ? Plantar fasciitis   ? Sleep apnea   ? ?Past Surgical History:  ?Procedure Laterality Date  ? BARIATRIC SURGERY  2018  ? CESAREAN SECTION    ? TUBAL LIGATION    ? ?Patient Active Problem List  ? Diagnosis Date Noted  ? Abdominal cramps 12/16/2020  ? COVID-19 virus infection 11/30/2020  ? 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 Vaginal irritation 09/18/2020  ? Hip pain, right 09/16/2020  ? Snoring 09/16/2020  ? Labial skin tag 09/13/2020  ? Fatigue 12/06/2019  ? Allergies 12/06/2019  ? Hair loss 12/06/2019  ? GAD (generalized anxiety disorder) 12/26/2018  ? Gastroesophageal reflux disease 06/04/2017  ? Vitamin D deficiency 10/21/2016  ? 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 Obesity (BMI 30-39.9) 06/11/2015  ? 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 Skin tag 06/05/2014  ? Allergic rhinitis 06/05/2014  ? URI (upper respiratory infection) 08/23/2012  ? Low back pain 04/04/2010  ? Morbid obesity ([place]) 02/10/2008  ? ? ?REFERRING DIAG: : M72.2 (ICD-10-CM) - Plantar fasciitis ? ?THERAPY DIAG:  ?No diagnosis found. ? ?PERTINENT HISTORY: N/A ? ?PRECAUTIONS: None ? ?ONSET DATE: 01/2021 ? ?SUBJECTIVE: *** ? ?PAIN:  ?Are you having pain? Yes ?NPRS scale: 9/10 ?Pain location: Lt medial ankle ?PAIN TYPE: burning ?Pain description: intermittent  ?Aggravating factors: prolonged standing >15 minutes, stair negotiation, prolonged standing >15 minutes ?Relieving factors: pain medication and rest ? ? ? ?OBJECTIVE:  ?  *Unless otherwise noted, objective information collected previously* ? ?DIAGNOSTIC FINDINGS: 08/29/2022: MR Heel Left without Contrast: IMPRESSION: ?1. Mild tendinosis and tenosynovitis of the peroneus longus. ?2. Mild muscle edema in the abductor hallucis muscle and flexor ?digitorum brevis muscle concerning for muscle strain. ?  ?PATIENT SURVEYS:  ?FOTO 48%, predicted 67% in 14 visits ?01/16/2022: 67% (GOAL MET) ?  ?COGNITION: ?         Overall cognitive status: Within functional limits for tasks assessed              ?          ?SENSATION: ?         Light touch: Appears intact ?          ?  ?MUSCLE LENGTH: ?Gastrocnemius: ?Soleus: ?  ?POSTURE:  ?Pt stands with BIL pes planus ?  ?PALPATION: ?TTP to BIL plantar heel and medial arch ? ?01/14/2022: Additionally TTP to BIL metatarsal heads ?  ?LE AROM/PROM: ?  ?A/PROM Right ?12/17/2021 Left ?12/17/2021 Right ?01/14/2022 Left ?01/14/2022  ?Ankle dorsiflexion -6/6 2/5 3/10 2/5  ?Ankle plantarflexion 50/50 45/45    ?Ankle inversion 25/45 35/50    ?Ankle eversion 25/45 10/35    ? (Blank rows = not tested) ?  ?LE MMT: ?  ?MMT Right ?12/17/2021 Left ?12/17/2021 Right ?01/02/2022 Left ?01/02/2022  ?Hip flexion 4/5 4/5 4/5 4/5  ?Hip extension 3/5 3/5    ?Hip abduction 3+/5 3+/5 4/5 4/5  ?Knee flexion 5/5  5/5    ?Knee extension 5/5 5/5    ?Ankle dorsiflexion 4/5 4/5    ?Ankle plantarflexion 5/5p! 5/5p!    ?Ankle inversion 4/5p! 4/5p!    ?Ankle eversion 4/5p! 4/5p!    ? (Blank rows = not tested) ?  ?LOWER EXTREMITY SPECIAL TESTS:  ?Windlass test: (+) BIL ?Step-down test: (+) p! On Lt ?Too many toes sign: (+) BIL ?Figure 8 swelling measure: Rt: 51cm, Lt: 52cm ?  ?FUNCTIONAL TESTS:  ?Squat: 75%,no pain ?5xSTS: 18 seconds ?SLS: Lt: 4 seconds before LOB, Rt: 2 seconds before LOB ?DL heel raise x25: x20 before stopping due to pain/ fatigue ?SL heel raise x10: Rt: x3 with poor form and pain, Lt: unable to perform due to weakness/ pain ?  ?  01/14/2022: SL heel raise x10: WNL BIL ?GAIT: ?Distance  walked: 20 ft ?Assistive device utilized: None ?Level of assistance: Complete Independence ?Comments: Pt walks on lateral aspect of BIL feet, antalgic gait ?  ?  ?  ?TODAY'S TREATMENT: ? ?[facility] PT Treatment:                                                DATE: 01/21/2022 ?Therapeutic Exercise: ?*** ?Manual Therapy: ?*** ?Neuromuscular re-ed: ?*** ?Therapeutic Activity: ?*** ?Modalities: ?*** ?Self Care: ?*** ? ? ?[facility] Adult PT Treatment:                                                DATE: 01/16/2022 ?Therapeutic Exercise: ?Long-sitting plantar fascia stretch with sheet x2 min BIL ?Seated level 3 BAPS rotations without letting edge of board touch ground, pt stabilizing knee with hands 2x10 clockwise and counter-clockwise BIL ?Seated heel-toe ankle rolls on each side of BOSU ball (into inversion and eversion) 2x20 BIL ?Seated ankle ball squeeze and heel raise 3x15 ?Standing gastrocnemius stretch x2 minutes ?Manual Therapy: ?N/A ?Neuromuscular re-ed: ?N/A ?Therapeutic Activity: ?FOTO re-administration ?Modalities: ?GameReady vasopneumatic treatment at 34 degrees farenheit to Lt ankle with ankles elevated x10 minutes with no adverse response  ?Self Care: ?N/A ? ? ?[facility] Adult PT Treatment:                                                DATE: 01/09/2022 ?Therapeutic Exercise: ?Heel on Airex pad with ball of food on ground, SLS while passing 5# kettlebell from side to side 3x20 BIL ?Single leg heel raises 3x10 BIL with UE support ?Forward lunge on each side of Bosu ball 2x10 BIL on each side of ball ?Forward woodpeckers at wall with UE support 3x10 BIL ?Manual Therapy: ?N/A ?Neuromuscular re-ed: ?N/A ?Therapeutic Activity: ?Re-assessment of objective measures with education for pt ?Education regarding potential benefits of a metatarsal bar and medial arch support orthotic, along with wear time instructions ?Modalities: ?N/A ?Self Care: ?N/A ? ?  ?  ?PATIENT EDUCATION:  ?Education details: Updated HEP ?Person educated:  Patient ?Education method: Explanation ?Education comprehension: verbalized understanding ?  ?  ?HOME EXERCISE PROGRAM: ?Access Code: [email or web address] ?URL: [email or web address] ?Date: 12/17/2021 ?Prepared by: Vanessa Issaquena ?  ?Exercises ?Heel Raises with Counter Support - 1 x daily - 7  x weekly - 3 sets - 15 reps ?Long Sitting Plantar Fascia Stretch with Towel - 1 x daily - 7 x weekly - 2 sets - 1-min hold ?Seated Plantar Fascia Stretch - 1 x daily - 7 x weekly - 2 sets - 1-min hold ?Gastroc Stretch on Wall - 1 x daily - 7 x weekly - 2 sets - 1-min hold ? ?Added 01/14/2022: ?Single Leg Heel Raise on Step - 1 x daily - 7 x weekly - 3 sets - 10 reps - 3-sec hold ?  ?  ?ASSESSMENT: ?  ?CLINICAL IMPRESSION: ?*** ? ?  ?REHAB POTENTIAL: Good ?  ?CLINICAL DECISION MAKING: Stable/uncomplicated ?  ?EVALUATION COMPLEXITY: Low ?  ?  ?GOALS: ?Goals reviewed with patient? No ?  ?SHORT TERM GOALS: ?  ?STG Name Target Date Goal status  ?1 Pt will report understanding and adherence to her HEP in order to promote independence in the management of her primary impairments. ?Baseline: HEP provided at eval ?01/14/2022: Pt reports daily adherence to her HEP 02/11/2022 ACHIEVED  ?  ?LONG TERM GOALS:  ?  ?LTG Name Target Date Goal status  ?1 Pt will demonstrate 10 SL heel raises BIL in order to reach into overhead cabinets at work with less limitation. ?Baseline: 3 on Rt, unable on Lt ?01/23/2022: Pt achieves 3x10 SL heel raises BIL 02/11/2022 ACHIEVED  ?2 Pt will achieve a FOTO score of 67% in order to demonstrate improved functional ability as it relates to her foot pain. ?Baseline: 48% ?01/16/2022: 67% 02/11/2022 ACHIEVED  ?3 Pt will achieve BIL ankle dorsiflexion of 10 degrees in order to promote WNL gait. ?Baseline: -6 on Rt, 2 on Lt ?01/14/2022: 3 on Rt, 2 on Lt 02/11/2022 IN PROGRESS  ?4 Pt will report ability to walk >1 hour with 0-3/10 pain in order to perform her work duties with less limitation. ?Baseline: >8/10 pain after 15  minutes of walking 02/11/2022 INITIAL  ?5 Pt will achieve BIL global LE strength of 4+/5 or greater in order to progress her independent strengthening program with less limitation. ?Baseline: See MMT c

## 2022-01-20 ENCOUNTER — Telehealth: Payer: Self-pay | Admitting: Podiatry

## 2022-01-20 NOTE — Telephone Encounter (Signed)
Looks like she just started PT in February, it's OK to continue but she should have an appt made to see me in late April for re-evaluation if you want to extend until then

## 2022-01-20 NOTE — Telephone Encounter (Signed)
Stacey Palmer was last seen 10/31/2021, she would like intermittent [name], she still has PT 2 times week but is experiencing severe foot pain at times. She is out of work now, she has been out since 01/20/2022, she told [employer] she would return 02/03/2022. Does she need to come in to be seen for her [name]? ?

## 2022-01-22 ENCOUNTER — Other Ambulatory Visit: Payer: Medicaid Other | Admitting: Obstetrics & Gynecology

## 2022-01-23 ENCOUNTER — Other Ambulatory Visit (HOSPITAL_COMMUNITY): Payer: Self-pay

## 2022-01-23 ENCOUNTER — Other Ambulatory Visit: Payer: Self-pay

## 2022-01-23 ENCOUNTER — Ambulatory Visit
Admission: RE | Admit: 2022-01-23 | Discharge: 2022-01-23 | Disposition: A | Discharge status: Home / Self Care | Payer: No Typology Code available for payment source | Source: Ambulatory Visit | Attending: Urgent Care | Admitting: Urgent Care

## 2022-01-23 VITALS — BP 137/83 | HR 81 | Temp 99.2°F | Resp 18 | Ht 67.0 in | Wt 280.0 lb

## 2022-01-23 DIAGNOSIS — R52 Pain, unspecified: Secondary | ICD-10-CM | POA: Diagnosis not present

## 2022-01-23 DIAGNOSIS — Z1152 Encounter for screening for COVID-19: Secondary | ICD-10-CM

## 2022-01-23 DIAGNOSIS — H938X3 Other specified disorders of ear, bilateral: Secondary | ICD-10-CM

## 2022-01-23 DIAGNOSIS — B349 Viral infection, unspecified: Secondary | ICD-10-CM | POA: Diagnosis not present

## 2022-01-23 DIAGNOSIS — J309 Allergic rhinitis, unspecified: Secondary | ICD-10-CM

## 2022-01-23 DIAGNOSIS — R052 Subacute cough: Secondary | ICD-10-CM

## 2022-01-23 MED ORDER — PREDNISONE 20 MG PO TABS
ORAL_TABLET | ORAL | 0 refills | Status: DC
Start: 2022-01-23 — End: 2022-02-02

## 2022-01-23 MED ORDER — PROMETHAZINE-DM 6.25-15 MG/5ML PO SYRP: 5.0000 mL | ORAL_SOLUTION | Freq: Four times a day (QID) | ORAL | 0 refills | Status: DC | PRN

## 2022-01-23 MED ORDER — LEVOCETIRIZINE DIHYDROCHLORIDE 5 MG PO TABS: 5.0000 mg | ORAL_TABLET | Freq: Every evening | ORAL | 0 refills | Status: DC

## 2022-01-23 MED ORDER — OSELTAMIVIR PHOSPHATE 75 MG PO CAPS: 75.0000 mg | ORAL_CAPSULE | Freq: Two times a day (BID) | ORAL | 0 refills | Status: DC

## 2022-01-23 NOTE — ED Triage Notes (Signed)
Pt reports cough, fatigue, body aches, emesis, intermittent diarrhea since yesterday.  ? ? ?

## 2022-01-23 NOTE — ED Provider Notes (Signed)
?[facility] ? ? ?MRN: [id number] DOB: Feb 17, 1981 ? ?Subjective:  ? ?Stacey Palmer is a 41 y.o. female presenting for 1 day history of acute onset body aches, malaise, fatigue, intermittent diarrhea, coughing, scratchy throat, bilateral ear fullness and itching.  No chest pain, shortness of breath or wheezing.  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  She is wondering if this could be affecting her.  She would like to be tested to make sure that she does not have COVID or flu.  Has a history of difficult allergies and needs a refill on her allergy medications.  No history of asthma.  No smoking. ? ?No current facility-administered medications for this encounter. ? ?Current Outpatient Medications:  ?  amoxicillin (AMOXIL) 875 MG tablet, Take 1 tablet (875 mg total) by mouth 2 (two) times daily. (Patient not taking: Reported on 12/17/2021), Disp: 14 tablet, Rfl: 0 ?  Cholecalciferol (VITAMIN D3 PO), Take 1 capsule by mouth once a week., Disp: , Rfl:  ?  Ferrous Sulfate (IRON PO), Take 1 capsule by mouth daily., Disp: , Rfl:  ?  fluticasone (FLONASE) 50 MCG/ACT nasal spray, Place 2 sprays into both nostrils daily., Disp: 16 g, Rfl: 6 ?  IFEREX 150 150 MG capsule, Take 150 mg by mouth 2 (two) times daily. (Patient not taking: Reported on 12/17/2021), Disp: , Rfl:  ?  ipratropium (ATROVENT) 0.03 % nasal spray, Place 2 sprays into both nostrils 2 (two) times daily., Disp: 30 mL, Rfl: 0 ?  levocetirizine (XYZAL) 5 MG tablet, Take 1 tablet (5 mg total) by mouth every evening., Disp: 30 tablet, Rfl: 1 ?  meloxicam (MOBIC) 15 MG tablet, TAKE 1/2 TABLET(7.5 MG) BY MOUTH TWICE DAILY AS NEEDED FOR PAIN, Disp: 60 tablet, Rfl: 0 ?  pantoprazole (PROTONIX) 40 MG tablet, Take 1 tablet (40 mg total) by mouth daily., Disp: 30 tablet, Rfl: 3 ?  prednisoLONE acetate (PRED FORTE) 1 % ophthalmic suspension, SMARTSIG:In Eye(s), Disp: , Rfl:  ?  predniSONE (DELTASONE) 20 MG tablet, Take 2  tablets (40 mg total) by mouth daily with breakfast., Disp: 10 tablet, Rfl: 0 ?  promethazine-dextromethorphan (PROMETHAZINE-DM) 6.25-15 MG/5ML syrup, Take 5 mLs by mouth 4 (four) times daily as needed. (Patient not taking: Reported on 12/17/2021), Disp: 100 mL, Rfl: 0 ?  pseudoephedrine (SUDAFED) 60 MG tablet, Take 1 tablet (60 mg total) by mouth every 8 (eight) hours as needed for congestion., Disp: 30 tablet, Rfl: 0 ?  valACYclovir (VALTREX) 500 MG tablet, Take 500 mg by mouth daily., Disp: , Rfl:  ?  venlafaxine XR (EFFEXOR-XR) 75 MG 24 hr capsule, Take 1 capsule (75 mg total) by mouth daily with breakfast. Dose increase effective 03/24/2019, Disp: 30 capsule, Rfl: 3 ?  Vitamin D, Ergocalciferol, (DRISDOL) 1.25 MG (50000 UNIT) CAPS capsule, TAKE 1 CAPSULE BY MOUTH ONCE A WEEK AS DIRECTED, Disp: 4 capsule, Rfl: 4  ? ?No Known Allergies ? ?Past Medical History:  ?Diagnosis Date  ? Allergy   ? Anxiety   ? 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 pHQ 9 SCORE OF 12 IN 12/2018, score of 0 in 03/2019 on medication  ? Dry eyes 12/26/2018  ? H/O gastric sleeve 2018  ? History of mammogram 2021  ? Obesity   ? Plantar fasciitis   ? Sleep apnea   ?  ? ?Past Surgical History:  ?Procedure Laterality Date  ? BARIATRIC SURGERY  2018  ? CESAREAN  SECTION    ? TUBAL LIGATION    ? ? ?Family History  ?Problem Relation Age of Onset  ? Hypertension Mother   ? Kidney disease Father   ? Diabetes Father   ? Breast cancer Paternal Aunt   ? Dementia Paternal Grandmother   ? Asthma Daughter   ? ? ?Social History  ? ?Tobacco Use  ? Smoking status: Never  ? Smokeless tobacco: Never  ?Vaping Use  ? Vaping Use: Never used  ?Substance Use Topics  ? Alcohol use: No  ? Drug use: No  ? ? ?ROS ? ? ?Objective:  ? ?Vitals: ?BP 137/83 (BP Location: Right Arm)   Pulse 81   Temp 99.2 ?F (37.3 ?C) (Oral)   Resp 18   Ht 5\' 7"  (1.702 m)   Wt 280 lb (127 kg)   LMP 01/12/2022 (Approximate)   SpO2 98%   BMI 43.85 kg/m?   ? ?Physical Exam ?Constitutional:   ?   General: She is not in acute distress. ?   Appearance: Normal appearance. She is well-developed. She is obese. She is ill-appearing. She is not toxic-appearing or diaphoretic.  ?HENT:  ?   Head: Normocephalic and atraumatic.  ?   Right Ear: Tympanic membrane, ear canal and external ear normal. No drainage or tenderness. No middle ear effusion. There is no impacted cerumen. Tympanic membrane is not erythematous.  ?   Left Ear: Tympanic membrane, ear canal and external ear normal. No drainage or tenderness.  No middle ear effusion. There is no impacted cerumen. Tympanic membrane is not erythematous.  ?   Nose: Congestion present. No rhinorrhea.  ?   Mouth/Throat:  ?   Mouth: Mucous membranes are moist. No oral lesions.  ?   Pharynx: No pharyngeal swelling, oropharyngeal exudate, posterior oropharyngeal erythema or uvula swelling.  ?   Tonsils: No tonsillar exudate or tonsillar abscesses.  ?Eyes:  ?   General: No scleral icterus.    ?   Right eye: No discharge.     ?   Left eye: No discharge.  ?   Extraocular Movements: Extraocular movements intact.  ?   Right eye: Normal extraocular motion.  ?   Left eye: Normal extraocular motion.  ?   Conjunctiva/sclera: Conjunctivae normal.  ?Cardiovascular:  ?   Rate and Rhythm: Normal rate.  ?   Heart sounds: No murmur heard. ?  No friction rub. No gallop.  ?Pulmonary:  ?   Effort: Pulmonary effort is normal. No respiratory distress.  ?   Breath sounds: No stridor. No wheezing, rhonchi or rales.  ?Chest:  ?   Chest wall: No tenderness.  ?Musculoskeletal:  ?   Cervical back: Normal range of motion and neck supple.  ?Lymphadenopathy:  ?   Cervical: No cervical adenopathy.  ?Skin: ?   General: Skin is warm and dry.  ?Neurological:  ?   General: No focal deficit present.  ?   Mental Status: She is alert and oriented to person, place, and time.  ?Psychiatric:     ?   Mood and Affect: Mood normal.     ?   Behavior: Behavior normal.   ? ? ?Assessment and Plan :  ? ?PDMP not reviewed this encounter. ? ?1. Acute viral syndrome   ?2. Encounter for screening for COVID-19   ?3. Body aches   ?4. Ear fullness, bilateral   ?5. Allergic rhinitis, unspecified seasonality, unspecified trigger   ?6. Subacute cough   ? ?Deferred imaging given clear cardiopulmonary  exam, hemodynamically stable vital signs.  In the context of her allergic rhinitis recommended an oral prednisone course which the patient would like to trial.  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  I will have her start Tamiflu and discontinue if her test is negative.  Use supportive care otherwise.  I did refill on allergy medication for her. Counseled patient on potential for adverse effects with medications prescribed/recommended today, ER and return-to-clinic precautions discussed, patient verbalized understanding. ? ?  ?Wallis Bamberg, PA-C ?01/23/22 1226 ? ?

## 2022-01-24 ENCOUNTER — Other Ambulatory Visit (HOSPITAL_COMMUNITY): Payer: Self-pay

## 2022-01-24 LAB — COVID-19, FLU A+B NAA
Influenza A, NAA: NOT DETECTED
Influenza B, NAA: NOT DETECTED
SARS-CoV-2, NAA: NOT DETECTED

## 2022-01-24 MED ORDER — LEVOCETIRIZINE DIHYDROCHLORIDE 5 MG PO TABS
5.0000 mg | ORAL_TABLET | Freq: Every evening | ORAL | 0 refills | Status: DC
  Filled 2022-01-24: qty 90, 90d supply, fill #0

## 2022-01-28 ENCOUNTER — Ambulatory Visit (INDEPENDENT_AMBULATORY_CARE_PROVIDER_SITE_OTHER): Payer: No Typology Code available for payment source | Admitting: Adult Health

## 2022-01-28 ENCOUNTER — Other Ambulatory Visit: Payer: Self-pay

## 2022-01-28 ENCOUNTER — Other Ambulatory Visit (HOSPITAL_COMMUNITY)
Admission: RE | Admit: 2022-01-28 | Discharge: 2022-01-28 | Disposition: A | Discharge status: Home / Self Care | Payer: No Typology Code available for payment source | Source: Ambulatory Visit | Attending: Adult Health | Admitting: Adult Health

## 2022-01-28 ENCOUNTER — Encounter: Payer: Self-pay | Admitting: Adult Health

## 2022-01-28 VITALS — BP 123/83 | HR 72 | Ht 67.0 in | Wt 257.0 lb

## 2022-01-28 DIAGNOSIS — Z0001 Encounter for general adult medical examination with abnormal findings: Secondary | ICD-10-CM | POA: Diagnosis not present

## 2022-01-28 DIAGNOSIS — Z1211 Encounter for screening for malignant neoplasm of colon: Secondary | ICD-10-CM

## 2022-01-28 DIAGNOSIS — Z3202 Encounter for pregnancy test, result negative: Secondary | ICD-10-CM

## 2022-01-28 DIAGNOSIS — Z01419 Encounter for gynecological examination (general) (routine) without abnormal findings: Secondary | ICD-10-CM | POA: Insufficient documentation

## 2022-01-28 DIAGNOSIS — Z3009 Encounter for other general counseling and advice on contraception: Secondary | ICD-10-CM

## 2022-01-28 DIAGNOSIS — Z0000 Encounter for general adult medical examination without abnormal findings: Secondary | ICD-10-CM

## 2022-01-28 DIAGNOSIS — R4589 Other symptoms and signs involving emotional state: Secondary | ICD-10-CM | POA: Diagnosis not present

## 2022-01-28 HISTORY — DX: Other symptoms and signs involving emotional state: R45.89

## 2022-01-28 HISTORY — DX: Encounter for general adult medical examination without abnormal findings: Z00.00

## 2022-01-28 HISTORY — DX: Encounter for other general counseling and advice on contraception: Z30.09

## 2022-01-28 HISTORY — DX: Encounter for pregnancy test, result negative: Z32.02

## 2022-01-28 HISTORY — DX: Encounter for screening for malignant neoplasm of colon: Z12.11

## 2022-01-28 HISTORY — DX: Encounter for gynecological examination (general) (routine) without abnormal findings: Z01.419

## 2022-01-28 LAB — POCT URINE PREGNANCY: Preg Test, Ur: NEGATIVE

## 2022-01-28 LAB — HEMOCCULT GUIAC POC 1CARD (OFFICE): Fecal Occult Blood, POC: NEGATIVE

## 2022-01-28 MED ORDER — SERTRALINE HCL 25 MG PO TABS: 25.0000 mg | ORAL_TABLET | Freq: Every day | ORAL | 6 refills | Status: DC

## 2022-01-28 NOTE — Progress Notes (Signed)
Patient ID: Stacey Palmer, female   DOB: 20-Jul-1981, 41 y.o.   MRN: [id number] ?History of Present Illness: ?Stacey Palmer is a 41 year old black female, single, G3P3, in for a well woman gyn exam and pap. She is feeling sad. ? ? ? ?Current Medications, Allergies, Past Medical History, Past Surgical History, Family History and Social History were reviewed in [name] record.   ? ? ?Review of Systems: ?Patient denies any headaches, hearing loss, fatigue, blurred vision, shortness of breath, chest pain, abdominal pain, problems with bowel movements, urination, or intercourse(not currently active). No joint pain or mood swings. She has plantar fascitis, and seeing foot doctor.  ?Had mucous discharge yesterday  ? ?Physical Exam:BP 123/83 (BP Location: Left Arm, Patient Position: Sitting, Cuff Size: Normal)   Pulse 72   Ht 5\' 7"  (1.702 m)   Wt 257 lb (116.6 kg)   LMP 01/12/2022 (Approximate)   BMI 40.25 kg/m?  UPT is negative. ?General:  Well developed, well nourished, no acute distress ?Skin:  Warm and dry ?Neck:  Midline trachea, normal thyroid, good ROM, no lymphadenopathy ?Lungs; Clear to auscultation bilaterally ?Breast:  No dominant palpable mass, retraction, or nipple discharge ?Cardiovascular: Regular rate and rhythm ?Abdomen:  Soft, non tender, no hepatosplenomegaly ?Pelvic:  External genitalia is normal in appearance, no lesions.  The vagina is normal in appearance. Urethra has no lesions or masses. The cervix is bulbous.Pap with GC/CHL and HR HPV genotyping performed.  Uterus is felt to be normal size, shape, and contour.  No adnexal masses or tenderness noted.Bladder is non tender, no masses felt. ?Rectal: Good sphincter tone, no polyps, or hemorrhoids felt.  Hemoccult negative. ?Extremities/musculoskeletal:  No swelling or varicosities noted, no clubbing or cyanosis ?Psych:  No mood changes, alert and cooperative,seems happy ?AA is 1 ?Fall risk is low ? ?  01/28/2022  ?  3:52 PM 12/19/2020   ?  8:56 AM 12/18/2020  ?  4:24 PM  ?Depression screen PHQ 2/9  ?Decreased Interest 0 0   ?Down, Depressed, Hopeless 1 0   ?PHQ - 2 Score 1 0   ?Altered sleeping 0    ?Tired, decreased energy 1    ?Change in appetite 0    ?Feeling bad or failure about yourself  0    ?Trouble concentrating 0    ?Moving slowly or fidgety/restless 0    ?Suicidal thoughts 0    ?PHQ-9 Score 2    ?  ? Information is confidential and restricted. Go to Review Flowsheets to unlock data.  ?  ? ?  01/28/2022  ?  3:52 PM 12/18/2020  ?  4:30 PM 10/01/2020  ?  3:22 PM 04/16/2020  ?  8:11 AM  ?GAD 7 : Generalized Anxiety Score  ?Nervous, Anxious, on Edge 0  0 0  ?Control/stop worrying 0  0 0  ?Worry too much - different things 0  0 0  ?Trouble relaxing 0  0 1  ?Restless 0  0 0  ?Easily annoyed or irritable 0  0 0  ?Afraid - awful might happen 0  0 0  ?Total GAD 7 Score 0  0 1  ?Anxiety Difficulty    Not difficult at all  ?  ? Information is confidential and restricted. Go to Review Flowsheets to unlock data.  ? ? [facility] - 01/28/22 1546   ? ?  ? Pregnancy Intention Screening  ? Does the patient want to become pregnant in the next year? Ok Either Way   ?  Does the patient's partner want to become pregnant in the next year? Ok Either Way   ? Would the patient like to discuss contraceptive options today? Yes   ?  ? Contraception Wrap Up  ? Current Method Abstinence   ???Tubal ligation, will request records  ? End Method Abstinence   ? Contraception Counseling Provided No   ? ?  ?  ? ?  ?  ? Examination chaperoned by Malachy Mood LPN ? ?  ? ?Impression and Plan: ? ?1. Encounter for gynecological examination with Papanicolaou smear of cervix ?Pap sent ?Physical in 1 year ?Pap in 3 if normal ?Need to see Dr Lodema Hong, was her PCP ? ? ?2. Pregnancy examination or test, negative result ? ?3. Family planning ?Request records from [facility], ?tubal ? ?4. Feeling sad ?Will try Zoloft 25 mg daily ?Meds ordered this encounter  ?Medications  ? sertraline (ZOLOFT) 25 MG  tablet  ?  Sig: Take 1 tablet (25 mg total) by mouth daily.  ?  Dispense:  30 tablet  ?  Refill:  6  ?  Order Specific Question:   Supervising Provider  ?  Answer:   Duane Lope H [[id number]]  ? Follow up in 8 weeks  ? ?5. Encounter for screening fecal occult blood testing ?Hemoccult was negative  ? ? ?  ?  ?

## 2022-01-29 ENCOUNTER — Encounter: Payer: Self-pay | Admitting: Podiatry

## 2022-01-29 ENCOUNTER — Telehealth: Payer: Self-pay | Admitting: Podiatry

## 2022-01-29 ENCOUNTER — Ambulatory Visit: Payer: No Typology Code available for payment source

## 2022-01-29 NOTE — Therapy (Signed)
?OUTPATIENT PHYSICAL THERAPY TREATMENT NOTE ? ? ?Patient Name: Stacey Palmer ?MRN: [id number] ?DOB:06/02/1981, 41 y.o., female ?Today's Date: 01/30/2022 ? ?PCP: Pcp, No ?REFERRING PROVIDER: Criselda Peaches, DPM ? ? PT End of Session - 01/30/22 1743   ? ? Visit Number 7   ? Number of Visits 17   ? Date for PT Re-Evaluation 02/11/22   ? Authorization Type MCE   ? Progress Note Due on Visit 10   ? PT Start Time 4010   ? PT Stop Time 2725   ? PT Time Calculation (min) 45 min   ? Activity Tolerance Patient tolerated treatment well   ? Behavior During Therapy [facility] for tasks assessed/performed   ? ?  ?  ? ?  ? ? ? ? ? ? ? ?Past Medical History:  ?Diagnosis Date  ? Allergy   ? Anxiety   ? Depression, major, single episode, in partial remission ([place]) 12/26/2018  ? pHQ 9 SCORE OF 12 IN 12/2018, score of 0 in 03/2019 on medication  ? Dry eyes 12/26/2018  ? H/O gastric sleeve 2018  ? History of mammogram 2021  ? Obesity   ? Plantar fasciitis   ? Sleep apnea   ? ?Past Surgical History:  ?Procedure Laterality Date  ? BARIATRIC SURGERY  2018  ? CESAREAN SECTION    ? TUBAL LIGATION    ? ?Patient Active Problem List  ? Diagnosis Date Noted  ? Encounter for screening fecal occult blood testing 01/28/2022  ? Feeling sad 01/28/2022  ? Family planning 01/28/2022  ? Pregnancy examination or test, negative result 01/28/2022  ? Encounter for gynecological examination with Papanicolaou smear of cervix 01/28/2022  ? Abdominal cramps 12/16/2020  ? COVID-19 virus infection 11/30/2020  ? 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 Vaginal irritation 09/18/2020  ? Hip pain, right 09/16/2020  ? Snoring 09/16/2020  ? Labial skin tag 09/13/2020  ? Fatigue 12/06/2019  ? Allergies 12/06/2019  ? Hair loss 12/06/2019  ? GAD (generalized anxiety disorder) 12/26/2018  ? Gastroesophageal reflux disease 06/04/2017  ? Vitamin D deficiency 10/21/2016  ? 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 Obesity (BMI 30-39.9) 06/11/2015  ? 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 Skin tag 06/05/2014  ? Allergic rhinitis 06/05/2014  ? URI (upper respiratory infection) 08/23/2012  ? Low back pain 04/04/2010  ? Morbid obesity ([place]) 02/10/2008  ? ? ?REFERRING DIAG: : M72.2 (ICD-10-CM) - Plantar fasciitis ? ?THERAPY DIAG:  ?Pain in left foot ? ?Muscle weakness (generalized) ? ?Difficulty in walking, not elsewhere classified ? ?Pain in right foot ? ?PERTINENT HISTORY: N/A ? ?PRECAUTIONS: None ? ?ONSET DATE: 01/2021 ? ?SUBJECTIVE: Pt reports BIL plantar pain today rated 8/10. She adds that she would like more information about orthotics. She reports doing her Hep every other day. ? ?PAIN:  ?Are you having pain? Yes ?NPRS scale: 8/10 ?Pain location: Lt medial ankle ?PAIN TYPE: burning ?Pain description: intermittent  ?Aggravating factors: prolonged standing >15 minutes, stair negotiation, prolonged standing >15 minutes ?Relieving factors: pain medication and rest ? ? ? ?OBJECTIVE:  ? *Unless otherwise noted, objective information collected previously* ? ?DIAGNOSTIC FINDINGS: 08/29/2022: MR Heel Left without Contrast: IMPRESSION: ?1. Mild tendinosis and tenosynovitis of the peroneus longus. ?2. Mild muscle edema in the abductor hallucis muscle and flexor ?digitorum brevis muscle concerning for muscle strain. ?  ?PATIENT SURVEYS:  ?FOTO 48%, predicted 67% in 14 visits ?01/16/2022: 67% (GOAL MET) ?  ?COGNITION: ?         Overall  cognitive status: Within functional limits for tasks assessed              ?          ?SENSATION: ?         Light touch: Appears intact ?          ?  ?MUSCLE LENGTH: ?Gastrocnemius: ?Soleus: ?  ?POSTURE:  ?Pt stands with BIL pes planus ?  ?PALPATION: ?TTP to BIL plantar heel and medial arch ? ?01/14/2022: Additionally TTP to BIL metatarsal heads ?  ?LE AROM/PROM: ?  ?A/PROM Right ?12/17/2021 Left ?12/17/2021 Right ?01/14/2022 Left ?01/14/2022  ?Ankle dorsiflexion -6/6 2/5 3/10 2/5  ?Ankle plantarflexion 50/50 45/45    ?Ankle inversion 25/45 35/50    ?Ankle eversion 25/45 10/35     ? (Blank rows = not tested) ?  ?LE MMT: ?  ?MMT Right ?12/17/2021 Left ?12/17/2021 Right ?01/02/2022 Left ?01/02/2022  ?Hip flexion 4/5 4/5 4/5 4/5  ?Hip extension 3/5 3/5    ?Hip abduction 3+/5 3+/5 4/5 4/5  ?Knee flexion 5/5 5/5    ?Knee extension 5/5 5/5    ?Ankle dorsiflexion 4/5 4/5    ?Ankle plantarflexion 5/5p! 5/5p!    ?Ankle inversion 4/5p! 4/5p!    ?Ankle eversion 4/5p! 4/5p!    ? (Blank rows = not tested) ?  ?LOWER EXTREMITY SPECIAL TESTS:  ?Windlass test: (+) BIL ?Step-down test: (+) p! On Lt ?Too many toes sign: (+) BIL ?Figure 8 swelling measure: Rt: 51cm, Lt: 52cm ?  ?FUNCTIONAL TESTS:  ?Squat: 75%,no pain ?5xSTS: 18 seconds ?SLS: Lt: 4 seconds before LOB, Rt: 2 seconds before LOB ?DL heel raise x25: x20 before stopping due to pain/ fatigue ?SL heel raise x10: Rt: x3 with poor form and pain, Lt: unable to perform due to weakness/ pain ?  ?  01/14/2022: SL heel raise x10: WNL BIL ?GAIT: ?Distance walked: 20 ft ?Assistive device utilized: None ?Level of assistance: Complete Independence ?Comments: Pt walks on lateral aspect of BIL feet, antalgic gait ?  ?  ?  ?TODAY'S TREATMENT: ? ?[place] Adult PT Treatment:                                                DATE: 01/30/2022 ?Therapeutic Exercise: ?Long-sitting plantar fascia stretch x51mn BIL ?Seated inversion towel slides with 5# dumbbell on towel x3 full length of towel BIL ?Kickstand stance with primary stance foot on edge of Airex while performing lateral 5# kettlebell hand-offs 2x20 BIL ?Standing astroc slant board strech x2 minutes ?Forward woodpeckers 3x8 on Airex pad ?Manual Therapy: ?N/A ?Neuromuscular re-ed: ?N/A ?Therapeutic Activity: ?Pt education regarding benefits of metatarsal bar and medial arch support with printout with examples of each ?Dead lifts to table with 5# kettlebell 3x10 ?Modalities: ?N/A ?Self Care: ?N/A ? ? ?[facility]:                                                DATE: 01/16/2022 ?Therapeutic Exercise: ?Long-sitting  plantar fascia stretch with sheet x2 min BIL ?Seated level 3 BAPS rotations without letting edge of board touch ground, pt stabilizing knee with hands 2x10 clockwise and counter-clockwise BIL ?Seated heel-toe ankle rolls on each side of BOSU ball (into inversion  and eversion) 2x20 BIL ?Seated ankle ball squeeze and heel raise 3x15 ?Standing gastrocnemius stretch x2 minutes ?Manual Therapy: ?N/A ?Neuromuscular re-ed: ?N/A ?Therapeutic Activity: ?FOTO re-administration ?Modalities: ?GameReady vasopneumatic treatment at 34 degrees farenheit to Lt ankle with ankles elevated x10 minutes with no adverse response  ?Self Care: ?N/A ? ? ?[place] Adult PT Treatment:                                                DATE: 01/09/2022 ?Therapeutic Exercise: ?Heel on Airex pad with ball of food on ground, SLS while passing 5# kettlebell from side to side 3x20 BIL ?Single leg heel raises 3x10 BIL with UE support ?Forward lunge on each side of Bosu ball 2x10 BIL on each side of ball ?Forward woodpeckers at wall with UE support 3x10 BIL ?Manual Therapy: ?N/A ?Neuromuscular re-ed: ?N/A ?Therapeutic Activity: ?Re-assessment of objective measures with education for pt ?Education regarding potential benefits of a metatarsal bar and medial arch support orthotic, along with wear time instructions ?Modalities: ?N/A ?Self Care: ?N/A ? ?  ?  ?PATIENT EDUCATION:  ?Education details: See therapeutic activity ?Person educated: Patient ?Education method: Explanation ?Education comprehension: verbalized understanding ?  ?  ?HOME EXERCISE PROGRAM: ?Access Code: [email or web address] ?URL: [email or web address] ?Date: 12/17/2021 ?Prepared by: Vanessa [employer] ?  ?Exercises ?Heel Raises with Counter Support - 1 x daily - 7 x weekly - 3 sets - 15 reps ?Long Sitting Plantar Fascia Stretch with Towel - 1 x daily - 7 x weekly - 2 sets - 1-min hold ?Seated Plantar Fascia Stretch - 1 x daily - 7 x weekly - 2 sets - 1-min hold ?Gastroc Stretch on Wall - 1 x  daily - 7 x weekly - 2 sets - 1-min hold ? ?Added 01/14/2022: ?Single Leg Heel Raise on Step - 1 x daily - 7 x weekly - 3 sets - 10 reps - 3-sec hold ?  ?  ?ASSESSMENT: ?  ?CLINICAL IMPRESSION: ?Pt responded we

## 2022-01-29 NOTE — Telephone Encounter (Signed)
Wrote pt a letter per Dr Lilian Kapur. Called pt LM on VM that letter is ready in [employer] office. ?

## 2022-01-29 NOTE — Telephone Encounter (Signed)
Yes that's fine. Can return to work Monday

## 2022-01-29 NOTE — Telephone Encounter (Signed)
Could one of you guys write the letter she is a Stacey Palmer pt and let her know so she can pick it up there. ?

## 2022-01-29 NOTE — Telephone Encounter (Signed)
Pt asking if she could get a note for light duty for work. And when should she go back to work maybe Monday or Tuesday next week. ? ?

## 2022-01-30 ENCOUNTER — Telehealth: Payer: Self-pay | Admitting: Podiatry

## 2022-01-30 ENCOUNTER — Ambulatory Visit: Payer: No Typology Code available for payment source

## 2022-01-30 ENCOUNTER — Encounter: Payer: Self-pay | Admitting: *Deleted

## 2022-01-30 DIAGNOSIS — R262 Difficulty in walking, not elsewhere classified: Secondary | ICD-10-CM

## 2022-01-30 DIAGNOSIS — M79671 Pain in right foot: Secondary | ICD-10-CM

## 2022-01-30 DIAGNOSIS — M79672 Pain in left foot: Secondary | ICD-10-CM

## 2022-01-30 DIAGNOSIS — M6281 Muscle weakness (generalized): Secondary | ICD-10-CM

## 2022-01-30 NOTE — Telephone Encounter (Signed)
Patient is still experiencing pain, return to work date was 02/03/2022, was extended until 02/10/2022. Patient would like to return to work with light duty, due to continued pain. ?

## 2022-01-31 LAB — CYTOLOGY - PAP
Chlamydia: NEGATIVE
Comment: NEGATIVE
Comment: NEGATIVE
Comment: NORMAL
Diagnosis: NEGATIVE
High risk HPV: NEGATIVE
Neisseria Gonorrhea: NEGATIVE

## 2022-01-31 NOTE — Therapy (Incomplete)
?OUTPATIENT PHYSICAL THERAPY TREATMENT NOTE ? ? ?Patient Name: Stacey Palmer ?MRN: [id number] ?DOB:1981-02-01, 41 y.o., female ?Today's Date: 01/31/2022 ? ?PCP: Pcp, No ?REFERRING PROVIDER: Criselda Peaches, DPM ? ? ? ? ? ? ? ? ? ?Past Medical History:  ?Diagnosis Date  ? Allergy   ? Anxiety   ? Depression, major, single episode, in partial remission ([place]) 12/26/2018  ? pHQ 9 SCORE OF 12 IN 12/2018, score of 0 in 03/2019 on medication  ? Dry eyes 12/26/2018  ? H/O gastric sleeve 2018  ? History of mammogram 2021  ? Obesity   ? Plantar fasciitis   ? Sleep apnea   ? ?Past Surgical History:  ?Procedure Laterality Date  ? BARIATRIC SURGERY  2018  ? CESAREAN SECTION    ? TUBAL LIGATION    ? ?Patient Active Problem List  ? Diagnosis Date Noted  ? Encounter for screening fecal occult blood testing 01/28/2022  ? Feeling sad 01/28/2022  ? Family planning 01/28/2022  ? Pregnancy examination or test, negative result 01/28/2022  ? Encounter for gynecological examination with Papanicolaou smear of cervix 01/28/2022  ? Abdominal cramps 12/16/2020  ? COVID-19 virus infection 11/30/2020  ? 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 Vaginal irritation 09/18/2020  ? Hip pain, right 09/16/2020  ? Snoring 09/16/2020  ? Labial skin tag 09/13/2020  ? Fatigue 12/06/2019  ? Allergies 12/06/2019  ? Hair loss 12/06/2019  ? GAD (generalized anxiety disorder) 12/26/2018  ? Gastroesophageal reflux disease 06/04/2017  ? Vitamin D deficiency 10/21/2016  ? 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 Obesity (BMI 30-39.9) 06/11/2015  ? 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 Skin tag 06/05/2014  ? Allergic rhinitis 06/05/2014  ? URI (upper respiratory infection) 08/23/2012  ? Low back pain 04/04/2010  ? Morbid obesity ([place]) 02/10/2008  ? ? ?REFERRING DIAG: : M72.2 (ICD-10-CM) - Plantar fasciitis ? ?THERAPY DIAG:  ?No diagnosis found. ? ?PERTINENT HISTORY: N/A ? ?PRECAUTIONS: None ? ?ONSET DATE: 01/2021 ? ?SUBJECTIVE: *** ? ?PAIN:  ?Are you having pain?  Yes ?NPRS scale: 8/10 ?Pain location: Lt medial ankle ?PAIN TYPE: burning ?Pain description: intermittent  ?Aggravating factors: prolonged standing >15 minutes, stair negotiation, prolonged standing >15 minutes ?Relieving factors: pain medication and rest ? ? ? ?OBJECTIVE:  ? *Unless otherwise noted, objective information collected previously* ? ?DIAGNOSTIC FINDINGS: 08/29/2022: MR Heel Left without Contrast: IMPRESSION: ?1. Mild tendinosis and tenosynovitis of the peroneus longus. ?2. Mild muscle edema in the abductor hallucis muscle and flexor ?digitorum brevis muscle concerning for muscle strain. ?  ?PATIENT SURVEYS:  ?FOTO 48%, predicted 67% in 14 visits ?01/16/2022: 67% (GOAL MET) ?  ?COGNITION: ?         Overall cognitive status: Within functional limits for tasks assessed              ?          ?SENSATION: ?         Light touch: Appears intact ?          ?  ?MUSCLE LENGTH: ?Gastrocnemius: ?Soleus: ?  ?POSTURE:  ?Pt stands with BIL pes planus ?  ?PALPATION: ?TTP to BIL plantar heel and medial arch ? ?01/14/2022: Additionally TTP to BIL metatarsal heads ?  ?LE AROM/PROM: ?  ?A/PROM Right ?12/17/2021 Left ?12/17/2021 Right ?01/14/2022 Left ?01/14/2022  ?Ankle dorsiflexion -6/6 2/5 3/10 2/5  ?Ankle plantarflexion 50/50 45/45    ?Ankle inversion 25/45 35/50    ?Ankle eversion 25/45 10/35    ? (Blank rows =  not tested) ?  ?LE MMT: ?  ?MMT Right ?12/17/2021 Left ?12/17/2021 Right ?01/02/2022 Left ?01/02/2022  ?Hip flexion 4/5 4/5 4/5 4/5  ?Hip extension 3/5 3/5    ?Hip abduction 3+/5 3+/5 4/5 4/5  ?Knee flexion 5/5 5/5    ?Knee extension 5/5 5/5    ?Ankle dorsiflexion 4/5 4/5    ?Ankle plantarflexion 5/5p! 5/5p!    ?Ankle inversion 4/5p! 4/5p!    ?Ankle eversion 4/5p! 4/5p!    ? (Blank rows = not tested) ?  ?LOWER EXTREMITY SPECIAL TESTS:  ?Windlass test: (+) BIL ?Step-down test: (+) p! On Lt ?Too many toes sign: (+) BIL ?Figure 8 swelling measure: Rt: 51cm, Lt: 52cm ?  ?FUNCTIONAL TESTS:  ?Squat: 75%,no pain ?5xSTS: 18  seconds ?SLS: Lt: 4 seconds before LOB, Rt: 2 seconds before LOB ?DL heel raise x25: x20 before stopping due to pain/ fatigue ?SL heel raise x10: Rt: x3 with poor form and pain, Lt: unable to perform due to weakness/ pain ?  ?  01/14/2022: SL heel raise x10: WNL BIL ?GAIT: ?Distance walked: 20 ft ?Assistive device utilized: None ?Level of assistance: Complete Independence ?Comments: Pt walks on lateral aspect of BIL feet, antalgic gait ?  ?  ?  ?TODAY'S TREATMENT: ? ?[place] Adult PT Treatment:                                                DATE: 02/03/2022 ?Therapeutic Exercise: ?*** ?Manual Therapy: ?*** ?Neuromuscular re-ed: ?*** ?Therapeutic Activity: ?*** ?Modalities: ?*** ?Self Care: ?*** ? ? ?[place] Adult PT Treatment:                                                DATE: 01/30/2022 ?Therapeutic Exercise: ?Long-sitting plantar fascia stretch x68mn BIL ?Seated inversion towel slides with 5# dumbbell on towel x3 full length of towel BIL ?Kickstand stance with primary stance foot on edge of Airex while performing lateral 5# kettlebell hand-offs 2x20 BIL ?Standing astroc slant board strech x2 minutes ?Forward woodpeckers 3x8 on Airex pad ?Manual Therapy: ?N/A ?Neuromuscular re-ed: ?N/A ?Therapeutic Activity: ?Pt education regarding benefits of metatarsal bar and medial arch support with printout with examples of each ?Dead lifts to table with 5# kettlebell 3x10 ?Modalities: ?N/A ?Self Care: ?N/A ? ? ?[facility]                                                DATE: 01/16/2022 ?Therapeutic Exercise: ?Long-sitting plantar fascia stretch with sheet x2 min BIL ?Seated level 3 BAPS rotations without letting edge of board touch ground, pt stabilizing knee with hands 2x10 clockwise and counter-clockwise BIL ?Seated heel-toe ankle rolls on each side of BOSU ball (into inversion and eversion) 2x20 BIL ?Seated ankle ball squeeze and heel raise 3x15 ?Standing gastrocnemius stretch x2 minutes ?Manual  Therapy: ?N/A ?Neuromuscular re-ed: ?N/A ?Therapeutic Activity: ?FOTO re-administration ?Modalities: ?GameReady vasopneumatic treatment at 34 degrees farenheit to Lt ankle with ankles elevated x10 minutes with no adverse response  ?Self Care: ?N/A ? ? ?  ?  ?PATIENT EDUCATION:  ?Education details: See therapeutic activity ?Person  educated: Patient ?Education method: Explanation ?Education comprehension: verbalized understanding ?  ?  ?HOME EXERCISE PROGRAM: ?Access Code: [email or web address] ?URL: [email or web address] ?Date: 12/17/2021 ?Prepared by: Vanessa [place] ?  ?Exercises ?Heel Raises with Counter Support - 1 x daily - 7 x weekly - 3 sets - 15 reps ?Long Sitting Plantar Fascia Stretch with Towel - 1 x daily - 7 x weekly - 2 sets - 1-min hold ?Seated Plantar Fascia Stretch - 1 x daily - 7 x weekly - 2 sets - 1-min hold ?Gastroc Stretch on Wall - 1 x daily - 7 x weekly - 2 sets - 1-min hold ? ?Added 01/14/2022: ?Single Leg Heel Raise on Step - 1 x daily - 7 x weekly - 3 sets - 10 reps - 3-sec hold ?  ?  ?ASSESSMENT: ?  ?CLINICAL IMPRESSION: ?*** ? ?  ?REHAB POTENTIAL: Good ?  ?CLINICAL DECISION MAKING: Stable/uncomplicated ?  ?EVALUATION COMPLEXITY: Low ?  ?  ?GOALS: ?Goals reviewed with patient? No ?  ?SHORT TERM GOALS: ?  ?STG Name Target Date Goal status  ?1 Pt will report understanding and adherence to her HEP in order to promote independence in the management of her primary impairments. ?Baseline: HEP provided at eval ?01/14/2022: Pt reports daily adherence to her HEP 02/11/2022 ACHIEVED  ?  ?LONG TERM GOALS:  ?  ?LTG Name Target Date Goal status  ?1 Pt will demonstrate 10 SL heel raises BIL in order to reach into overhead cabinets at work with less limitation. ?Baseline: 3 on Rt, unable on Lt ?01/23/2022: Pt achieves 3x10 SL heel raises BIL 02/11/2022 ACHIEVED  ?2 Pt will achieve a FOTO score of 67% in order to demonstrate improved functional ability as it relates to her foot pain. ?Baseline:  48% ?01/16/2022: 67% 02/11/2022 ACHIEVED  ?3 Pt will achieve BIL ankle dorsiflexion of 10 degrees in order to promote WNL gait. ?Baseline: -6 on Rt, 2 on Lt ?01/14/2022: 3 on Rt, 2 on Lt 02/11/2022 IN PROGRESS  ?4 Pt will report ability to walk >1 ho

## 2022-02-02 ENCOUNTER — Ambulatory Visit (INDEPENDENT_AMBULATORY_CARE_PROVIDER_SITE_OTHER): Payer: No Typology Code available for payment source

## 2022-02-02 ENCOUNTER — Other Ambulatory Visit: Payer: Self-pay

## 2022-02-02 ENCOUNTER — Ambulatory Visit (HOSPITAL_COMMUNITY)
Admission: EM | Admit: 2022-02-02 | Discharge: 2022-02-02 | Disposition: A | Discharge status: Home / Self Care | Payer: No Typology Code available for payment source | Attending: Urgent Care | Admitting: Urgent Care

## 2022-02-02 ENCOUNTER — Encounter (HOSPITAL_COMMUNITY): Payer: Self-pay | Admitting: *Deleted

## 2022-02-02 DIAGNOSIS — M79672 Pain in left foot: Secondary | ICD-10-CM

## 2022-02-02 DIAGNOSIS — M2142 Flat foot [pes planus] (acquired), left foot: Secondary | ICD-10-CM

## 2022-02-02 DIAGNOSIS — M79671 Pain in right foot: Secondary | ICD-10-CM | POA: Diagnosis not present

## 2022-02-02 DIAGNOSIS — G8929 Other chronic pain: Secondary | ICD-10-CM

## 2022-02-02 DIAGNOSIS — M2141 Flat foot [pes planus] (acquired), right foot: Secondary | ICD-10-CM

## 2022-02-02 DIAGNOSIS — M5416 Radiculopathy, lumbar region: Secondary | ICD-10-CM | POA: Diagnosis not present

## 2022-02-02 MED ORDER — GABAPENTIN 100 MG PO CAPS: 100.0000 mg | ORAL_CAPSULE | Freq: Two times a day (BID) | ORAL | 0 refills | Status: DC

## 2022-02-02 NOTE — ED Triage Notes (Signed)
Pt reports she has a burning feeling in both feet but feels worse in RT foot. Pt also reports pain Lt groin.  ?

## 2022-02-02 NOTE — Discharge Instructions (Addendum)
Your symptoms sound concerning for a pinched nerve. ?PCP assistance was placed to find to a new primary care.  You will need to follow-up with them for further work-up. ?Your lumbar x-ray does not show any acute findings, however I still have concern for a herniated disc to L1/2. ?Please continue taking your meloxicam, you may add gabapentin.  Start with just 1 capsule daily to monitor your response.  Side effects include dizziness or drowsiness.  You may increase to 3 capsules daily as needed. ? ?Given your chronic numbness and tingling of your feet, I would recommend your primary care also test you for B12 deficiency, thiamine deficiency, and an A1c for diabetes. ? ?Given your flatfeet, follow-up with podiatry for arch support or custom orthotics.  In the meantime you can purchase arch support over-the-counter. For your plantar fasciitis, you can try night splints, these can be purchased OTC. ?

## 2022-02-02 NOTE — ED Provider Notes (Signed)
?[facility] ? ? ? ?CSN: [id number] ?Arrival date & time: 02/02/22  1009 ? ? ?  ? ?History   ?Chief Complaint ?Chief Complaint  ?Patient presents with  ? Lt groin pain  ? ? ?HPI ?Stacey Palmer is a 41 y.o. female.  ? ?[name] with a long standing hx of bilateral foot pain presents today with concerns of worsening R foot pain now accompanied with L groin pain. She has been following with podiatry for "quite some time" for chronic L foot pain. She had an MRI of her left foot in October showing mild tendinosis and tenosynovitis of the peroneus longus and mild muscle edema in the abductor hallucis muscle and flexor digitorum brevis muscle concerning for muscle strain. Pt has been attending physical therapy as well for her symptoms. She takes daily mobic. Pt admits to me that her pain is bilaterally, not just on the left, and today it is actually her R foot that is bothering her the most. She reports the pain as a "burning pain, kind of tingling." She denies any leg involvement, burning pain does not extend proximal past her ankle. She states today however she also had a mild burning pain in her L "groin." She points to her inguinal canal as the location of the pain on the L. She denies saddle anesthesia, loss of bowel or bladder control. She denies any lumbar back pain and has had no recent injuries. She does not work out or do any heavy lifting. No rash. She does not drink excessive alcohol.  ? ? ? ?Past Medical History:  ?Diagnosis Date  ? Allergy   ? Anxiety   ? 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 pHQ 9 SCORE OF 12 IN 12/2018, score of 0 in 03/2019 on medication  ? Dry eyes 12/26/2018  ? H/O gastric sleeve 2018  ? History of mammogram 2021  ? Obesity   ? Plantar fasciitis   ? Sleep apnea   ? ? ?Patient Active Problem List  ? Diagnosis Date Noted  ? Encounter for screening fecal occult blood testing 01/28/2022  ? Feeling sad 01/28/2022  ? Family planning  01/28/2022  ? Pregnancy examination or test, negative result 01/28/2022  ? Encounter for gynecological examination with Papanicolaou smear of cervix 01/28/2022  ? Abdominal cramps 12/16/2020  ? COVID-19 virus infection 11/30/2020  ? 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 Vaginal irritation 09/18/2020  ? Hip pain, right 09/16/2020  ? Snoring 09/16/2020  ? Labial skin tag 09/13/2020  ? Fatigue 12/06/2019  ? Allergies 12/06/2019  ? Hair loss 12/06/2019  ? GAD (generalized anxiety disorder) 12/26/2018  ? Gastroesophageal reflux disease 06/04/2017  ? Vitamin D deficiency 10/21/2016  ? 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 Obesity (BMI 30-39.9) 06/11/2015  ? 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 Skin tag 06/05/2014  ? Allergic rhinitis 06/05/2014  ? URI (upper respiratory infection) 08/23/2012  ? Low back pain 04/04/2010  ? Morbid obesity (HCC) 02/10/2008  ? ? ?Past Surgical History:  ?Procedure Laterality Date  ? BARIATRIC SURGERY  2018  ? CESAREAN SECTION    ? TUBAL LIGATION    ? ? ?OB History   ? ? Gravida  ?3  ? Para  ?3  ? Term  ?3  ? Preterm  ?   ? AB  ?   ? Living  ?3  ?  ? ? SAB  ?   ? IAB  ?   ? Ectopic  ?   ?  Multiple  ?   ? Live Births  ?3  ?   ?  ?  ? ? ? ?Home Medications   ? ?Prior to Admission medications   ?Medication Sig Start Date End Date Taking? Authorizing Provider  ?gabapentin (NEURONTIN) 100 MG capsule Take 1 capsule (100 mg total) by mouth 2 (two) times daily. 02/02/22 03/04/22 Yes ,  L, PA  ?Cholecalciferol (VITAMIN D3 PO) Take 1 capsule by mouth once a week.    [provider]  ?Ferrous Sulfate (IRON PO) Take 1 capsule by mouth daily.    [provider]  ?fluticasone (FLONASE) 50 MCG/ACT nasal spray Place 2 sprays into both nostrils daily. 07/07/21   Junie Spencer, FNP  ?ipratropium (ATROVENT) 0.03 % nasal spray Place 2 sprays into both nostrils 2 (two) times daily. 11/22/21   Wallis Bamberg, PA-C  ?levocetirizine (XYZAL) 5 MG tablet Take 1 tablet (5 mg total) by mouth  every evening. 01/23/22   Wallis Bamberg, PA-C  ?meloxicam (MOBIC) 15 MG tablet TAKE 1/2 TABLET(7.5 MG) BY MOUTH TWICE DAILY AS NEEDED FOR PAIN 12/28/21   Edwin Cap, DPM  ?prednisoLONE acetate (PRED FORTE) 1 % ophthalmic suspension SMARTSIG:In Eye(s) 01/26/21   [provider]  ?promethazine-dextromethorphan (PROMETHAZINE-DM) 6.25-15 MG/5ML syrup Take 5 mLs by mouth 4 (four) times daily as needed for cough. 01/23/22   Wallis Bamberg, PA-C  ?pseudoephedrine (SUDAFED) 60 MG tablet Take 1 tablet (60 mg total) by mouth every 8 (eight) hours as needed for congestion. 11/22/21   Wallis Bamberg, PA-C  ?sertraline (ZOLOFT) 25 MG tablet Take 1 tablet (25 mg total) by mouth daily. 01/28/22   Adline Potter, NP  ?valACYclovir (VALTREX) 500 MG tablet Take 500 mg by mouth. 05/22/21   [provider]  ?Vitamin D, Ergocalciferol, (DRISDOL) 1.25 MG (50000 UNIT) CAPS capsule TAKE 1 CAPSULE BY MOUTH ONCE A WEEK AS DIRECTED 03/18/21   Kerri Perches, MD  ? ? ?Family History ?Family History  ?Problem Relation Age of Onset  ? Hypertension Mother   ? Kidney disease Father   ? Diabetes Father   ? Breast cancer Paternal Aunt   ? Dementia Paternal Grandmother   ? Asthma Daughter   ? ? ?Social History ?Social History  ? ?Tobacco Use  ? Smoking status: Never  ? Smokeless tobacco: Never  ?Vaping Use  ? Vaping Use: Never used  ?Substance Use Topics  ? Alcohol use: No  ? Drug use: No  ? ? ? ?Allergies   ?Patient has no known allergies. ? ? ?Review of Systems ?Review of Systems  ?Musculoskeletal:  Positive for arthralgias and myalgias.  ?Neurological:  Positive for numbness.  ?All other systems reviewed and are negative. ? ? ?Physical Exam ?Triage Vital Signs ?ED Triage Vitals  ?Enc Vitals Group  ?   BP 02/02/22 1032 122/83  ?   Pulse Rate 02/02/22 1032 (!) 56  ?   Resp 02/02/22 1032 20  ?   Temp 02/02/22 1032 97.8 ?F (36.6 ?C)  ?   Temp src --   ?   SpO2 02/02/22 1032 100 %  ?   Weight --   ?   Height --   ?   Head  Circumference --   ?   Peak Flow --   ?   Pain Score 02/02/22 1029 8  ?   Pain Loc --   ?   Pain Edu? --   ?   Excl. in GC? --   ? ?No  data found. ? ?Updated Vital Signs ?BP 122/83   Pulse (!) 56   Temp 97.8 ?F (36.6 ?C)   Resp 20   LMP 01/12/2022 (Approximate)   SpO2 100%  ? ?Visual Acuity ?Right Eye Distance:   ?Left Eye Distance:   ?Bilateral Distance:   ? ?Right Eye Near:   ?Left Eye Near:    ?Bilateral Near:    ? ?Physical Exam ?Vitals and nursing note reviewed. Exam conducted with a chaperone present.  ?Constitutional:   ?   General: She is not in acute distress. ?   Appearance: She is well-developed. She is obese. She is not ill-appearing, toxic-appearing or diaphoretic.  ?HENT:  ?   Head: Normocephalic and atraumatic.  ?Eyes:  ?   Conjunctiva/sclera: Conjunctivae normal.  ?Cardiovascular:  ?   Rate and Rhythm: Normal rate and regular rhythm.  ?   Pulses: Normal pulses.  ?   Heart sounds: No murmur heard. ?Pulmonary:  ?   Effort: Pulmonary effort is normal. No respiratory distress.  ?   Breath sounds: Normal breath sounds.  ?Abdominal:  ?   General: Abdomen is flat. Bowel sounds are normal.  ?   Palpations: Abdomen is soft. There is no mass.  ?   Tenderness: There is no abdominal tenderness.  ?   Hernia: No hernia is present.  ?Genitourinary: ?   Rectum: Normal.  ?   Comments: NORMAL SPHINCTER TONE ?Musculoskeletal:     ?   General: No swelling, tenderness, deformity or signs of injury. Normal range of motion.  ?   Cervical back: Normal range of motion and neck supple. No rigidity or tenderness.  ?   Right lower leg: No edema.  ?   Left lower leg: No edema.  ?   Comments: No reproducible lumbar tenderness ?No tenderness to spinous process, no step off deformity ?No rash. ?No inguinal bulge or swelling. ?No inguinal lymphadenopathy. ?FROM hip ?Negative FABER test ?Negative supine and seated SLR ?Very flat feet bilaterally  ?Lymphadenopathy:  ?   Cervical: No cervical adenopathy.  ?Skin: ?   General: Skin  is warm and dry.  ?   Capillary Refill: Capillary refill takes less than 2 seconds.  ?   Coloration: Skin is not jaundiced.  ?   Findings: No erythema or rash.  ?Neurological:  ?   General: No focal deficit present.  ?

## 2022-02-03 ENCOUNTER — Ambulatory Visit: Payer: No Typology Code available for payment source

## 2022-02-03 NOTE — Therapy (Signed)
?OUTPATIENT PHYSICAL THERAPY TREATMENT NOTE ? ? ?Patient Name: Stacey Palmer ?MRN: [id number] ?DOB:13-Oct-1981, 41 y.o., female ?Today's Date: 02/04/2022 ? ?PCP: Pcp, No ?REFERRING PROVIDER: Criselda Peaches, DPM ? ? PT End of Session - 02/04/22 1621   ? ? Visit Number 8   ? Number of Visits 17   ? Date for PT Re-Evaluation 02/11/22   ? Authorization Type MCE   ? Progress Note Due on Visit 10   ? PT Start Time 1620   ? PT Stop Time 1700   ? PT Time Calculation (min) 40 min   ? Activity Tolerance Patient tolerated treatment well   ? Behavior During Therapy [facility] for tasks assessed/performed   ? ?  ?  ? ?  ? ? ? ? ? ? ? ? ?Past Medical History:  ?Diagnosis Date  ? Allergy   ? Anxiety   ? Depression, major, single episode, in partial remission ([place]) 12/26/2018  ? pHQ 9 SCORE OF 12 IN 12/2018, score of 0 in 03/2019 on medication  ? Dry eyes 12/26/2018  ? H/O gastric sleeve 2018  ? History of mammogram 2021  ? Obesity   ? Plantar fasciitis   ? Sleep apnea   ? ?Past Surgical History:  ?Procedure Laterality Date  ? BARIATRIC SURGERY  2018  ? CESAREAN SECTION    ? TUBAL LIGATION    ? ?Patient Active Problem List  ? Diagnosis Date Noted  ? Encounter for screening fecal occult blood testing 01/28/2022  ? Feeling sad 01/28/2022  ? Family planning 01/28/2022  ? Pregnancy examination or test, negative result 01/28/2022  ? Encounter for gynecological examination with Papanicolaou smear of cervix 01/28/2022  ? Abdominal cramps 12/16/2020  ? COVID-19 virus infection 11/30/2020  ? 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 Vaginal irritation 09/18/2020  ? Hip pain, right 09/16/2020  ? Snoring 09/16/2020  ? Labial skin tag 09/13/2020  ? Fatigue 12/06/2019  ? Allergies 12/06/2019  ? Hair loss 12/06/2019  ? GAD (generalized anxiety disorder) 12/26/2018  ? Gastroesophageal reflux disease 06/04/2017  ? Vitamin D deficiency 10/21/2016  ? 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 Obesity (BMI 30-39.9) 06/11/2015  ? 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 Skin tag 06/05/2014  ? Allergic rhinitis 06/05/2014  ? URI (upper respiratory infection) 08/23/2012  ? Low back pain 04/04/2010  ? Morbid obesity ([place]) 02/10/2008  ? ? ?REFERRING DIAG: : M72.2 (ICD-10-CM) - Plantar fasciitis ? ?THERAPY DIAG:  ?Pain in left foot ? ?Muscle weakness (generalized) ? ?Difficulty in walking, not elsewhere classified ? ?Pain in right foot ? ?PERTINENT HISTORY: N/A ? ?PRECAUTIONS: None ? ?ONSET DATE: 01/2021 ? ?SUBJECTIVE: Pt reports that after exacerbation of Rt foot pain, she went to the ED, which she reports she regrets doing. She states she was prescribed an additional anti-inflammatory medication, which has helped [place]. Pt rates her pain today as 8/10. She also reports doing her HEP daily. ? ?PAIN:  ?Are you having pain? Yes ?NPRS scale: 8/10 ?Pain location: Lt medial ankle ?PAIN TYPE: burning ?Pain description: intermittent  ?Aggravating factors: prolonged standing >15 minutes, stair negotiation, prolonged standing >15 minutes ?Relieving factors: pain medication and rest ? ? ? ?OBJECTIVE:  ? *Unless otherwise noted, objective information collected previously* ? ?DIAGNOSTIC FINDINGS: 08/29/2022: MR Heel Left without Contrast: IMPRESSION: ?1. Mild tendinosis and tenosynovitis of the peroneus longus. ?2. Mild muscle edema in the abductor hallucis muscle and flexor ?digitorum brevis muscle concerning for muscle strain. ?  ?PATIENT SURVEYS:  ?FOTO 48%,  predicted 67% in 14 visits ?01/16/2022: 67% (GOAL MET) ?  ?COGNITION: ?         Overall cognitive status: Within functional limits for tasks assessed              ?          ?SENSATION: ?         Light touch: Appears intact ?          ?  ?MUSCLE LENGTH: ?Gastrocnemius: ?Soleus: ?  ?POSTURE:  ?Pt stands with BIL pes planus ?  ?PALPATION: ?TTP to BIL plantar heel and medial arch ? ?01/14/2022: Additionally TTP to BIL metatarsal heads ?  ?LE AROM/PROM: ?  ?A/PROM Right ?12/17/2021 Left ?12/17/2021 Right ?01/14/2022 Left ?01/14/2022  ?Ankle  dorsiflexion -6/6 2/5 3/10 2/5  ?Ankle plantarflexion 50/50 45/45    ?Ankle inversion 25/45 35/50    ?Ankle eversion 25/45 10/35    ? (Blank rows = not tested) ?  ?LE MMT: ?  ?MMT Right ?12/17/2021 Left ?12/17/2021 Right ?01/02/2022 Left ?01/02/2022 Right ?02/04/2022 Left ?02/04/2022  ?Hip flexion 4/5 4/5 4/5 4/5    ?Hip extension 3/5 3/5      ?Hip abduction 3+/5 3+/5 4/5 4/5    ?Knee flexion 5/5 5/5      ?Knee extension 5/5 5/5      ?Ankle dorsiflexion 4/5 4/5   4+/5 4+/5  ?Ankle plantarflexion 5/5p! 5/5p!   5/5 5/5  ?Ankle inversion 4/5p! 4/5p!   4/5p! 4+/5p!  ?Ankle eversion 4/5p! 4/5p!   4/5p! 4+/5  ? (Blank rows = not tested) ?  ?LOWER EXTREMITY SPECIAL TESTS:  ?Windlass test: (+) BIL ?Step-down test: (+) p! On Lt ?Too many toes sign: (+) BIL ?Figure 8 swelling measure: Rt: 51cm, Lt: 52cm ?  ?FUNCTIONAL TESTS:  ?Squat: 75%,no pain ?5xSTS: 18 seconds ?SLS: Lt: 4 seconds before LOB, Rt: 2 seconds before LOB ?DL heel raise x25: x20 before stopping due to pain/ fatigue ?SL heel raise x10: Rt: x3 with poor form and pain, Lt: unable to perform due to weakness/ pain ?  ?  01/14/2022: SL heel raise x10: WNL BIL ?GAIT: ?Distance walked: 20 ft ?Assistive device utilized: None ?Level of assistance: Complete Independence ?Comments: Pt walks on lateral aspect of BIL feet, antalgic gait ?  ?  ?  ?TODAY'S TREATMENT: ? ?[place] Adult PT Treatment:                                                DATE: 02/04/2022 ?Therapeutic Exercise: ?Long-sitting plantar fascia stretch x65mn BIL ?Seated ankle inversion/PF combined motion against YTB resistance 2x10 with 5-sec hold BIL ?Seated towel scrunches x2 length of towel BIL ?Standing heel raises on Airex pad 3x15 ?Standing gastrocnemius stretch x2 minutes ?Manual Therapy: ?N/A ?Neuromuscular re-ed: ?N/A ?Therapeutic Activity: ?N/A ?Modalities: ?N/A ?Self Care: ?N/A ? ? ?[facility]                                                DATE: 01/[place]/2022 ?Therapeutic Exercise: ?Long-sitting plantar  fascia stretch x253m BIL ?Seated inversion towel slides with 5# dumbbell on towel x3 full length of towel BIL ?Kickstand stance with primary stance foot on edge of Airex while performing lateral 5# kettlebell  hand-offs 2x20 BIL ?Standing gastroc slant board strech x2 minutes ?Forward woodpeckers 3x8 on Airex pad ?Manual Therapy: ?N/A ?Neuromuscular re-ed: ?N/A ?Therapeutic Activity: ?Pt education regarding benefits of metatarsal bar and medial arch support with printout with examples of each ?Dead lifts to table with 5# kettlebell 3x10 ?Modalities: ?N/A ?Self Care: ?N/A ? ? ?[place] Adult PT Treatment:                                                DATE: 01/16/2022 ?Therapeutic Exercise: ?Long-sitting plantar fascia stretch with sheet x2 min BIL ?Seated level 3 BAPS rotations without letting edge of board touch ground, pt stabilizing knee with hands 2x10 clockwise and counter-clockwise BIL ?Seated heel-toe ankle rolls on each side of BOSU ball (into inversion and eversion) 2x20 BIL ?Seated ankle ball squeeze and heel raise 3x15 ?Standing gastrocnemius stretch x2 minutes ?Manual Therapy: ?N/A ?Neuromuscular re-ed: ?N/A ?Therapeutic Activity: ?FOTO re-administration ?Modalities: ?GameReady vasopneumatic treatment at 34 degrees farenheit to Lt ankle with ankles elevated x10 minutes with no adverse response  ?Self Care: ?N/A ? ? ?  ?  ?PATIENT EDUCATION:  ?Education details: See therapeutic activity ?Person educated: Patient ?Education method: Explanation ?Education comprehension: verbalized understanding ?  ?  ?HOME EXERCISE PROGRAM: ?Access Code: [email or web address] ?URL: [email or web address] ?Date: 12/17/2021 ?Prepared by: Vanessa [place] ?  ?Exercises ?Heel Raises with Counter Support - 1 x daily - 7 x weekly - 3 sets - 15 reps ?Long Sitting Plantar Fascia Stretch with Towel - 1 x daily - 7 x weekly - 2 sets - 1-min hold ?Seated Plantar Fascia Stretch - 1 x daily - 7 x weekly - 2 sets - 1-min hold ?Gastroc Stretch  on Wall - 1 x daily - 7 x weekly - 2 sets - 1-min hold ? ?Added 01/14/2022: ?Single Leg Heel Raise on Step - 1 x daily - 7 x weekly - 3 sets - 10 reps - 3-sec hold ?  ?  ?ASSESSMENT: ?  ?CLINICAL IMPRESS

## 2022-02-04 ENCOUNTER — Ambulatory Visit: Payer: No Typology Code available for payment source | Attending: Podiatry

## 2022-02-04 ENCOUNTER — Telehealth: Payer: Self-pay | Admitting: Podiatry

## 2022-02-04 DIAGNOSIS — M79671 Pain in right foot: Secondary | ICD-10-CM | POA: Insufficient documentation

## 2022-02-04 DIAGNOSIS — M6281 Muscle weakness (generalized): Secondary | ICD-10-CM | POA: Diagnosis present

## 2022-02-04 DIAGNOSIS — R262 Difficulty in walking, not elsewhere classified: Secondary | ICD-10-CM | POA: Diagnosis present

## 2022-02-04 DIAGNOSIS — M79672 Pain in left foot: Secondary | ICD-10-CM | POA: Insufficient documentation

## 2022-02-04 NOTE — Telephone Encounter (Signed)
Yes, Joni Reining wrote her a letter to rtw on 02/10/2022, but she said that she needs more time. 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 ?

## 2022-02-04 NOTE — Telephone Encounter (Signed)
Patient would like to have leave extended, due to still experiencing bilateral foot pain, burning sensation along with swelling. Please advise? ?

## 2022-02-04 NOTE — Telephone Encounter (Signed)
Is this beyond what we discussed last week? Elmyra Ricks wrote her a letter on 3/29, did she get that?

## 2022-02-05 ENCOUNTER — Encounter: Payer: Self-pay | Admitting: Podiatry

## 2022-02-08 ENCOUNTER — Other Ambulatory Visit: Payer: Self-pay | Admitting: Podiatry

## 2022-02-13 ENCOUNTER — Other Ambulatory Visit (HOSPITAL_COMMUNITY): Payer: Self-pay

## 2022-02-13 ENCOUNTER — Ambulatory Visit: Payer: No Typology Code available for payment source

## 2022-02-13 MED ORDER — AMOXICILLIN 500 MG PO CAPS
500.0000 mg | ORAL_CAPSULE | Freq: Three times a day (TID) | ORAL | 0 refills | Status: AC
  Filled 2022-02-13: qty 21, 7d supply, fill #0

## 2022-02-13 MED ORDER — IBUPROFEN 800 MG PO TABS
800.0000 mg | ORAL_TABLET | Freq: Three times a day (TID) | ORAL | 0 refills | Status: DC | PRN
  Filled 2022-02-13: qty 21, 7d supply, fill #0

## 2022-02-13 NOTE — Therapy (Incomplete)
?OUTPATIENT PHYSICAL THERAPY TREATMENT NOTE/ RE-EVALUATION ? ? ?Patient Name: Stacey Palmer ?MRN: [id number] ?DOB:06/07/1981, 41 y.o., female ?Today's Date: 02/13/2022 ? ?PCP: Pcp, No ?REFERRING PROVIDER: Criselda Peaches, DPM ? ? ? ? ? ? ? ? ? ? ?Past Medical History:  ?Diagnosis Date  ? Allergy   ? Anxiety   ? Depression, major, single episode, in partial remission ([place]) 12/26/2018  ? pHQ 9 SCORE OF 12 IN 12/2018, score of 0 in 03/2019 on medication  ? Dry eyes 12/26/2018  ? H/O gastric sleeve 2018  ? History of mammogram 2021  ? Obesity   ? Plantar fasciitis   ? Sleep apnea   ? ?Past Surgical History:  ?Procedure Laterality Date  ? BARIATRIC SURGERY  2018  ? CESAREAN SECTION    ? TUBAL LIGATION    ? ?Patient Active Problem List  ? Diagnosis Date Noted  ? Encounter for screening fecal occult blood testing 01/28/2022  ? Feeling sad 01/28/2022  ? Family planning 01/28/2022  ? Pregnancy examination or test, negative result 01/28/2022  ? Encounter for gynecological examination with Papanicolaou smear of cervix 01/28/2022  ? Abdominal cramps 12/16/2020  ? COVID-19 virus infection 11/30/2020  ? 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 Vaginal irritation 09/18/2020  ? Hip pain, right 09/16/2020  ? Snoring 09/16/2020  ? Labial skin tag 09/13/2020  ? Fatigue 12/06/2019  ? Allergies 12/06/2019  ? Hair loss 12/06/2019  ? GAD (generalized anxiety disorder) 12/26/2018  ? Gastroesophageal reflux disease 06/04/2017  ? Vitamin D deficiency 10/21/2016  ? 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 Obesity (BMI 30-39.9) 06/11/2015  ? 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 Skin tag 06/05/2014  ? Allergic rhinitis 06/05/2014  ? URI (upper respiratory infection) 08/23/2012  ? Low back pain 04/04/2010  ? Morbid obesity ([place]) 02/10/2008  ? ? ?REFERRING DIAG: : M72.2 (ICD-10-CM) - Plantar fasciitis ? ?THERAPY DIAG:  ?No diagnosis found. ? ?PERTINENT HISTORY: N/A ? ?PRECAUTIONS: None ? ?ONSET DATE: 01/2021 ? ?SUBJECTIVE: *** ? ?PAIN:  ?Are you  having pain? Yes ?NPRS scale: 8/10 ?Pain location: Lt medial ankle ?PAIN TYPE: burning ?Pain description: intermittent  ?Aggravating factors: prolonged standing >15 minutes, stair negotiation, prolonged standing >15 minutes ?Relieving factors: pain medication and rest ? ? ? ?OBJECTIVE:  ? *Unless otherwise noted, objective information collected previously* ? ?DIAGNOSTIC FINDINGS: 08/29/2022: MR Heel Left without Contrast: IMPRESSION: ?1. Mild tendinosis and tenosynovitis of the peroneus longus. ?2. Mild muscle edema in the abductor hallucis muscle and flexor ?digitorum brevis muscle concerning for muscle strain. ?  ?PATIENT SURVEYS:  ?FOTO 48%, predicted 67% in 14 visits ?01/16/2022: 67% (GOAL MET) ?  ?COGNITION: ?         Overall cognitive status: Within functional limits for tasks assessed              ?          ?SENSATION: ?         Light touch: Appears intact ?          ?  ?MUSCLE LENGTH: ?Gastrocnemius: ?Soleus: ?  ?POSTURE:  ?Pt stands with BIL pes planus ?  ?PALPATION: ?TTP to BIL plantar heel and medial arch ? ?01/14/2022: Additionally TTP to BIL metatarsal heads ?  ?LE AROM/PROM: ?  ?A/PROM Right ?12/17/2021 Left ?12/17/2021 Right ?01/14/2022 Left ?01/14/2022  ?Ankle dorsiflexion -6/6 2/5 3/10 2/5  ?Ankle plantarflexion 50/50 45/45    ?Ankle inversion 25/45 35/50    ?Ankle eversion 25/45 10/35    ? (Blank  rows = not tested) ?  ?LE MMT: ?  ?MMT Right ?12/17/2021 Left ?12/17/2021 Right ?01/02/2022 Left ?01/02/2022 Right ?02/04/2022 Left ?02/04/2022  ?Hip flexion 4/5 4/5 4/5 4/5    ?Hip extension 3/5 3/5      ?Hip abduction 3+/5 3+/5 4/5 4/5    ?Knee flexion 5/5 5/5      ?Knee extension 5/5 5/5      ?Ankle dorsiflexion 4/5 4/5   4+/5 4+/5  ?Ankle plantarflexion 5/5p! 5/5p!   5/5 5/5  ?Ankle inversion 4/5p! 4/5p!   4/5p! 4+/5p!  ?Ankle eversion 4/5p! 4/5p!   4/5p! 4+/5  ? (Blank rows = not tested) ?  ?LOWER EXTREMITY SPECIAL TESTS:  ?Windlass test: (+) BIL ?Step-down test: (+) p! On Lt ?Too many toes sign: (+) BIL ?Figure 8  swelling measure: Rt: 51cm, Lt: 52cm ?  ?FUNCTIONAL TESTS:  ?Squat: 75%,no pain ?5xSTS: 18 seconds ?SLS: Lt: 4 seconds before LOB, Rt: 2 seconds before LOB ?DL heel raise x25: x20 before stopping due to pain/ fatigue ?SL heel raise x10: Rt: x3 with poor form and pain, Lt: unable to perform due to weakness/ pain ?  ?  01/14/2022: SL heel raise x10: WNL BIL ?GAIT: ?Distance walked: 20 ft ?Assistive device utilized: None ?Level of assistance: Complete Independence ?Comments: Pt walks on lateral aspect of BIL feet, antalgic gait ?  ?  ?  ?TODAY'S TREATMENT: ? ?[facility] Adult PT Treatment:                                                DATE: 02/13/2022 ?Therapeutic Exercise: ?*** ?Manual Therapy: ?*** ?Neuromuscular re-ed: ?*** ?Therapeutic Activity: ?*** ?Modalities: ?*** ?Self Care: ?*** ? ? ?[facility] Adult PT Treatment:                                                DATE: 02/04/2022 ?Therapeutic Exercise: ?Long-sitting plantar fascia stretch x58mn BIL ?Seated ankle inversion/PF combined motion against YTB resistance 2x10 with 5-sec hold BIL ?Seated towel scrunches x2 length of towel BIL ?Standing heel raises on Airex pad 3x15 ?Standing gastrocnemius stretch x2 minutes ?Manual Therapy: ?N/A ?Neuromuscular re-ed: ?N/A ?Therapeutic Activity: ?N/A ?Modalities: ?N/A ?Self Care: ?N/A ? ? ?[facility]                                                DATE: 01/30/2022 ?Therapeutic Exercise: ?Long-sitting plantar fascia stretch x244m BIL ?Seated inversion towel slides with 5# dumbbell on towel x3 full length of towel BIL ?Kickstand stance with primary stance foot on edge of Airex while performing lateral 5# kettlebell hand-offs 2x20 BIL ?Standing gastroc slant board strech x2 minutes ?Forward woodpeckers 3x8 on Airex pad ?Manual Therapy: ?N/A ?Neuromuscular re-ed: ?N/A ?Therapeutic Activity: ?Pt education regarding benefits of metatarsal bar and medial arch support with printout with examples of each ?Dead lifts to table with 5#  kettlebell 3x10 ?Modalities: ?N/A ?Self Care: ?N/A ? ? ? ?  ?  ?PATIENT EDUCATION:  ?Education details: See therapeutic activity ?Person educated: Patient ?Education method: Explanation ?Education comprehension: verbalized understanding ?  ?  ?HOME EXERCISE PROGRAM: ?  Access Code: [email or web address] ?URL: [email or web address] ?Date: 12/17/2021 ?Prepared by: Vanessa [place] ?  ?Exercises ?Heel Raises with Counter Support - 1 x daily - 7 x weekly - 3 sets - 15 reps ?Long Sitting Plantar Fascia Stretch with Towel - 1 x daily - 7 x weekly - 2 sets - 1-min hold ?Seated Plantar Fascia Stretch - 1 x daily - 7 x weekly - 2 sets - 1-min hold ?Gastroc Stretch on Wall - 1 x daily - 7 x weekly - 2 sets - 1-min hold ? ?Added 01/14/2022: ?Single Leg Heel Raise on Step - 1 x daily - 7 x weekly - 3 sets - 10 reps - 3-sec hold ?  ?  ?ASSESSMENT: ?  ?CLINICAL IMPRESSION: ?*** ? ?  ?REHAB POTENTIAL: Good ?  ?CLINICAL DECISION MAKING: Stable/uncomplicated ?  ?EVALUATION COMPLEXITY: Low ?  ?  ?GOALS: ?Goals reviewed with patient? No ?  ?SHORT TERM GOALS: ?  ?STG Name Target Date Goal status  ?1 Pt will report understanding and adherence to her HEP in order to promote independence in the management of her primary impairments. ?Baseline: HEP provided at eval ?01/14/2022: Pt reports daily adherence to her HEP 02/11/2022 ACHIEVED  ?  ?LONG TERM GOALS:  ?  ?LTG Name Target Date Goal status  ?1 Pt will demonstrate 10 SL heel raises BIL in order to reach into overhead cabinets at work with less limitation. ?Baseline: 3 on Rt, unable on Lt ?01/23/2022: Pt achieves 3x10 SL heel raises BIL 02/11/2022 ACHIEVED  ?2 Pt will achieve a FOTO score of 67% in order to demonstrate improved functional ability as it relates to her foot pain. ?Baseline: 48% ?01/16/2022: 67% 02/11/2022 ACHIEVED  ?3 Pt will achieve BIL ankle dorsiflexion of 10 degrees in order to promote WNL gait. ?Baseline: -6 on Rt, 2 on Lt ?01/14/2022: 3 on Rt, 2 on Lt 02/11/2022 IN PROGRESS   ?4 Pt will report ability to walk >1 hour with 0-3/10 pain in order to perform her work duties with less limitation. ?Baseline: >8/10 pain after 15 minutes of walking 02/11/2022 INITIAL  ?5 Pt will achie

## 2022-02-13 NOTE — Therapy (Signed)
?OUTPATIENT PHYSICAL THERAPY TREATMENT NOTE/ RE-EVALUATION ? ? ?Patient Name: Stacey Palmer ?MRN: [id number] ?DOB:12/21/1980, 41 y.o., female ?Today's Date: 02/14/2022 ? ?PCP: Pcp, No ?REFERRING PROVIDER: Criselda Peaches, DPM ? ? PT End of Session - 02/14/22 1102   ? ? Visit Number 9   ? Number of Visits 17   ? Date for PT Re-Evaluation 03/14/22   ? Authorization Type MCE   ? Progress Note Due on Visit 10   ? PT Start Time 1104   ? PT Stop Time 1145   ? PT Time Calculation (min) 41 min   ? Activity Tolerance Patient tolerated treatment well   ? Behavior During Therapy [facility] for tasks assessed/performed   ? ?  ?  ? ?  ? ? ? ? ? ? ? ? ? ?Past Medical History:  ?Diagnosis Date  ? Allergy   ? Anxiety   ? Depression, major, single episode, in partial remission ([place]) 12/26/2018  ? pHQ 9 SCORE OF 12 IN 12/2018, score of 0 in 03/2019 on medication  ? Dry eyes 12/26/2018  ? H/O gastric sleeve 2018  ? History of mammogram 2021  ? Obesity   ? Plantar fasciitis   ? Sleep apnea   ? ?Past Surgical History:  ?Procedure Laterality Date  ? BARIATRIC SURGERY  2018  ? CESAREAN SECTION    ? TUBAL LIGATION    ? ?Patient Active Problem List  ? Diagnosis Date Noted  ? Encounter for screening fecal occult blood testing 01/28/2022  ? Feeling sad 01/28/2022  ? Family planning 01/28/2022  ? Pregnancy examination or test, negative result 01/28/2022  ? Encounter for gynecological examination with Papanicolaou smear of cervix 01/28/2022  ? Abdominal cramps 12/16/2020  ? COVID-19 virus infection 11/30/2020  ? 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 Vaginal irritation 09/18/2020  ? Hip pain, right 09/16/2020  ? Snoring 09/16/2020  ? Labial skin tag 09/13/2020  ? Fatigue 12/06/2019  ? Allergies 12/06/2019  ? Hair loss 12/06/2019  ? GAD (generalized anxiety disorder) 12/26/2018  ? Gastroesophageal reflux disease 06/04/2017  ? Vitamin D deficiency 10/21/2016  ? 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 Obesity (BMI 30-39.9) 06/11/2015  ? Western blot  positive HSV2 06/20/2014  ? Skin tag 06/05/2014  ? Allergic rhinitis 06/05/2014  ? URI (upper respiratory infection) 08/23/2012  ? Low back pain 04/04/2010  ? Morbid obesity ([place]) 02/10/2008  ? ? ?REFERRING DIAG: : M72.2 (ICD-10-CM) - Plantar fasciitis ? ?THERAPY DIAG:  ?Pain in left foot - Plan: PT plan of care cert/re-cert ? ?Muscle weakness (generalized) - Plan: PT plan of care cert/re-cert ? ?Difficulty in walking, not elsewhere classified - Plan: PT plan of care cert/re-cert ? ?Pain in right foot - Plan: PT plan of care cert/re-cert ? ?PERTINENT HISTORY: N/A ? ?PRECAUTIONS: None ? ?ONSET DATE: 01/2021 ? ?SUBJECTIVE: Pt reports 7-8/10 in BIL plantar feet today. She reports varied adherence to her HEP, about 3 days per week. She reports that she has order medial arch supports and a metatarsal bar for her Rt foot from [employer]. ? ?PAIN:  ?Are you having pain? Yes ?NPRS scale: 7-8/10 ?Pain location: Lt medial ankle ?PAIN TYPE: burning ?Pain description: intermittent  ?Aggravating factors: prolonged standing >15 minutes, stair negotiation, prolonged standing >15 minutes ?Relieving factors: pain medication and rest ? ? ? ?OBJECTIVE:  ? *Unless otherwise noted, objective information collected previously* ? ?DIAGNOSTIC FINDINGS: 08/29/2022: MR Heel Left without Contrast: IMPRESSION: ?1. Mild tendinosis and tenosynovitis of the peroneus longus. ?2. Mild muscle  edema in the abductor hallucis muscle and flexor ?digitorum brevis muscle concerning for muscle strain. ?  ?PATIENT SURVEYS:  ?FOTO 48%, predicted 67% in 14 visits ?01/16/2022: 67% (GOAL MET) ?  ?COGNITION: ?         Overall cognitive status: Within functional limits for tasks assessed              ?          ?SENSATION: ?         Light touch: Appears intact ?          ?  ?MUSCLE LENGTH: ?Gastrocnemius: ?Soleus: ?  ?POSTURE:  ?Pt stands with BIL pes planus ?  ?PALPATION: ?TTP to BIL plantar heel and medial arch ? ?01/14/2022: Additionally TTP to BIL metatarsal heads ?   ?LE AROM/PROM: ?  ?A/PROM Right ?12/17/2021 Left ?12/17/2021 Right ?01/14/2022 Left ?01/14/2022 Right ?02/14/2022 Left ?02/14/2022  ?Ankle dorsiflexion -6/6 2/5 3/10 2/5 5/10 5/10  ?Ankle plantarflexion 50/50 45/45   45/55 45/50  ?Ankle inversion 25/45 35/50   25/45 40/60  ?Ankle eversion 25/45 10/35   10/34 10/35  ? (Blank rows = not tested) ?  ?LE MMT: ?  ?MMT Right ?12/17/2021 Left ?12/17/2021 Right ?01/02/2022 Left ?01/02/2022 Right ?02/04/2022 Left ?02/04/2022  ?Hip flexion 4/5 4/5 4/5 4/5    ?Hip extension 3/5 3/5      ?Hip abduction 3+/5 3+/5 4/5 4/5    ?Knee flexion 5/5 5/5      ?Knee extension 5/5 5/5      ?Ankle dorsiflexion 4/5 4/5   4+/5 4+/5  ?Ankle plantarflexion 5/5p! 5/5p!   5/5 5/5  ?Ankle inversion 4/5p! 4/5p!   4/5p! 4+/5p!  ?Ankle eversion 4/5p! 4/5p!   4/5p! 4+/5  ? (Blank rows = not tested) ?  ?LOWER EXTREMITY SPECIAL TESTS:  ?Windlass test: (+) BIL ?Step-down test: (+) p! On Lt ?Too many toes sign: (+) BIL ?Figure 8 swelling measure: Rt: 51cm, Lt: 52cm ?  ?FUNCTIONAL TESTS:  ?Squat: 75%,no pain ?5xSTS: 18 seconds ?SLS: Lt: 4 seconds before LOB, Rt: 2 seconds before LOB ?DL heel raise x25: x20 before stopping due to pain/ fatigue ?SL heel raise x10: Rt: x3 with poor form and pain, Lt: unable to perform due to weakness/ pain ?  ?  01/14/2022: SL heel raise x10: WNL BIL ? ?  02/14/2022: 5xSTS: 12 seconds   ? ?GAIT: ?Distance walked: 20 ft ?Assistive device utilized: None ?Level of assistance: Complete Independence ?Comments: Pt walks on lateral aspect of BIL feet, antalgic gait ?  ?  ?  ?TODAY'S TREATMENT: ? ?[facility] Adult PT Treatment:                                                DATE: 02/14/2022 ?Therapeutic Exercise: ?Seated inversion towel slides with 5# dumbbell on towel x3 full length of towel BIL ?Kickstand stance with primary stance foot on edge of Airex while performing lateral 10# kettlebell hand-offs 2x20 BIL ?Standing great toe extension stretch with forward lunge on edge of Airex pad x2mn  BIL ?Bouncing heel raises with tennis ball beneath medial malleoli 3x30 ?SL dead lift with 10# kettlebell and UE support 2x8 BIL ?Manual Therapy: ?N/A ?Neuromuscular re-ed: ?N/A ?Therapeutic Activity: ?Re-assessment of objective information with pt education about progress and POC ?Modalities: ?N/A ?Self Care: ?N/A ? ? ?[facility]  DATE: 02/04/2022 ?Therapeutic Exercise: ?Long-sitting plantar fascia stretch x75mn BIL ?Seated ankle inversion/PF combined motion against YTB resistance 2x10 with 5-sec hold BIL ?Seated towel scrunches x2 length of towel BIL ?Standing heel raises on Airex pad 3x15 ?Standing gastrocnemius stretch x2 minutes ?Manual Therapy: ?N/A ?Neuromuscular re-ed: ?N/A ?Therapeutic Activity: ?N/A ?Modalities: ?N/A ?Self Care: ?N/A ? ? ?[facility]                                                DATE: 01/30/2022 ?Therapeutic Exercise: ?Long-sitting plantar fascia stretch x216m BIL ?Seated inversion towel slides with 5# dumbbell on towel x3 full length of towel BIL ?Kickstand stance with primary stance foot on edge of Airex while performing lateral 5# kettlebell hand-offs 2x20 BIL ?Standing gastroc slant board strech x2 minutes ?Forward woodpeckers 3x8 on Airex pad ?Manual Therapy: ?N/A ?Neuromuscular re-ed: ?N/A ?Therapeutic Activity: ?Pt education regarding benefits of metatarsal bar and medial arch support with printout with examples of each ?Dead lifts to table with 5# kettlebell 3x10 ?Modalities: ?N/A ?Self Care: ?N/A ? ? ? ?  ?  ?PATIENT EDUCATION:  ?Education details: See therapeutic activity ?Person educated: Patient ?Education method: Explanation ?Education comprehension: verbalized understanding ?  ?  ?HOME EXERCISE PROGRAM: ?Access Code: [email or web address] ?URL: [email or web address] ?Date: 12/17/2021 ?Prepared by: TuVanessa   ?Exercises ?Heel Raises with Counter Support - 1 x daily - 7 x weekly - 3 sets - 15  reps ?Long Sitting Plantar Fascia Stretch with Towel - 1 x daily - 7 x weekly - 2 sets - 1-min hold ?Seated Plantar Fascia Stretch - 1 x daily - 7 x weekly - 2 sets - 1-min hold ?Gastroc Stretch on Wall - 1 x daily - 7 x

## 2022-02-14 ENCOUNTER — Ambulatory Visit: Payer: No Typology Code available for payment source

## 2022-02-14 DIAGNOSIS — R262 Difficulty in walking, not elsewhere classified: Secondary | ICD-10-CM

## 2022-02-14 DIAGNOSIS — M79671 Pain in right foot: Secondary | ICD-10-CM

## 2022-02-14 DIAGNOSIS — M79672 Pain in left foot: Secondary | ICD-10-CM | POA: Diagnosis not present

## 2022-02-14 DIAGNOSIS — M6281 Muscle weakness (generalized): Secondary | ICD-10-CM

## 2022-02-17 ENCOUNTER — Other Ambulatory Visit (HOSPITAL_COMMUNITY): Payer: Self-pay

## 2022-02-18 ENCOUNTER — Ambulatory Visit: Payer: No Typology Code available for payment source

## 2022-02-18 DIAGNOSIS — R262 Difficulty in walking, not elsewhere classified: Secondary | ICD-10-CM

## 2022-02-18 DIAGNOSIS — M6281 Muscle weakness (generalized): Secondary | ICD-10-CM

## 2022-02-18 DIAGNOSIS — M79671 Pain in right foot: Secondary | ICD-10-CM

## 2022-02-18 DIAGNOSIS — M79672 Pain in left foot: Secondary | ICD-10-CM | POA: Diagnosis not present

## 2022-02-18 NOTE — Therapy (Signed)
?OUTPATIENT PHYSICAL THERAPY TREATMENT NOTE ? ? ?Patient Name: Stacey Palmer ?MRN: [id number] ?DOB:06/27/1981, 41 y.o., female ?Today's Date: 02/18/2022 ? ?PCP: Pcp, No ?REFERRING PROVIDER: Criselda Peaches, DPM ? ? PT End of Session - 02/18/22 1542   ? ? Visit Number 10   ? Number of Visits 17   ? Date for PT Re-Evaluation 03/14/22   ? Authorization Type MCE   ? PT Start Time 35   Pt arrived 10 minutes late to her appointment  ? PT Stop Time 1623   10 minutes vasopneumatic treatment  ? PT Time Calculation (min) 43 min   ? Activity Tolerance Patient tolerated treatment well   ? Behavior During Therapy [facility] for tasks assessed/performed   ? ?  ?  ? ?  ? ? ? ? ? ? ? ? ? ? ?Past Medical History:  ?Diagnosis Date  ? Allergy   ? Anxiety   ? Depression, major, single episode, in partial remission ([place]) 12/26/2018  ? pHQ 9 SCORE OF 12 IN 12/2018, score of 0 in 03/2019 on medication  ? Dry eyes 12/26/2018  ? H/O gastric sleeve 2018  ? History of mammogram 2021  ? Obesity   ? Plantar fasciitis   ? Sleep apnea   ? ?Past Surgical History:  ?Procedure Laterality Date  ? BARIATRIC SURGERY  2018  ? CESAREAN SECTION    ? TUBAL LIGATION    ? ?Patient Active Problem List  ? Diagnosis Date Noted  ? Encounter for screening fecal occult blood testing 01/28/2022  ? Feeling sad 01/28/2022  ? Family planning 01/28/2022  ? Pregnancy examination or test, negative result 01/28/2022  ? Encounter for gynecological examination with Papanicolaou smear of cervix 01/28/2022  ? Abdominal cramps 12/16/2020  ? COVID-19 virus infection 11/30/2020  ? 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 Vaginal irritation 09/18/2020  ? Hip pain, right 09/16/2020  ? Snoring 09/16/2020  ? Labial skin tag 09/13/2020  ? Fatigue 12/06/2019  ? Allergies 12/06/2019  ? Hair loss 12/06/2019  ? GAD (generalized anxiety disorder) 12/26/2018  ? Gastroesophageal reflux disease 06/04/2017  ? Vitamin D deficiency 10/21/2016  ? 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 Obesity (BMI  30-39.9) 06/11/2015  ? 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 Skin tag 06/05/2014  ? Allergic rhinitis 06/05/2014  ? URI (upper respiratory infection) 08/23/2012  ? Low back pain 04/04/2010  ? Morbid obesity ([place]) 02/10/2008  ? ? ?REFERRING DIAG: : M72.2 (ICD-10-CM) - Plantar fasciitis ? ?THERAPY DIAG:  ?Pain in left foot ? ?Muscle weakness (generalized) ? ?Difficulty in walking, not elsewhere classified ? ?Pain in right foot ? ?PERTINENT HISTORY: N/A ? ?PRECAUTIONS: None ? ?ONSET DATE: 01/2021 ? ?SUBJECTIVE: Pt reports continued 8/10 pain in BIL feet. She reports she had increased swelling in BIL feet on Sunday after increased walking.  ? ?PAIN:  ?Are you having pain? Yes ?NPRS scale: 8/10 ?Pain location: Lt medial ankle ?PAIN TYPE: burning ?Pain description: intermittent  ?Aggravating factors: prolonged standing >15 minutes, stair negotiation, prolonged standing >15 minutes ?Relieving factors: pain medication and rest ? ? ? ?OBJECTIVE:  ? *Unless otherwise noted, objective information collected previously* ? ?DIAGNOSTIC FINDINGS: 08/29/2022: MR Heel Left without Contrast: IMPRESSION: ?1. Mild tendinosis and tenosynovitis of the peroneus longus. ?2. Mild muscle edema in the abductor hallucis muscle and flexor ?digitorum brevis muscle concerning for muscle strain. ?  ?PATIENT SURVEYS:  ?FOTO 48%, predicted 67% in 14 visits ?01/16/2022: 67% (GOAL MET) ?  ?COGNITION: ?  Overall cognitive status: Within functional limits for tasks assessed              ?          ?SENSATION: ?         Light touch: Appears intact ?          ?  ?MUSCLE LENGTH: ?Gastrocnemius: ?Soleus: ?  ?POSTURE:  ?Pt stands with BIL pes planus ?  ?PALPATION: ?TTP to BIL plantar heel and medial arch ? ?01/14/2022: Additionally TTP to BIL metatarsal heads ?  ?LE AROM/PROM: ?  ?A/PROM Right ?12/17/2021 Left ?12/17/2021 Right ?01/14/2022 Left ?01/14/2022 Right ?02/14/2022 Left ?02/14/2022  ?Ankle dorsiflexion -6/6 2/5 3/10 2/5 5/10 5/10  ?Ankle  plantarflexion 50/50 45/45   45/55 45/50  ?Ankle inversion 25/45 35/50   25/45 40/60  ?Ankle eversion 25/45 10/35   10/34 10/35  ? (Blank rows = not tested) ?  ?LE MMT: ?  ?MMT Right ?12/17/2021 Left ?12/17/2021 Right ?01/02/2022 Left ?01/02/2022 Right ?02/04/2022 Left ?02/04/2022  ?Hip flexion 4/5 4/5 4/5 4/5    ?Hip extension 3/5 3/5      ?Hip abduction 3+/5 3+/5 4/5 4/5    ?Knee flexion 5/5 5/5      ?Knee extension 5/5 5/5      ?Ankle dorsiflexion 4/5 4/5   4+/5 4+/5  ?Ankle plantarflexion 5/5p! 5/5p!   5/5 5/5  ?Ankle inversion 4/5p! 4/5p!   4/5p! 4+/5p!  ?Ankle eversion 4/5p! 4/5p!   4/5p! 4+/5  ? (Blank rows = not tested) ?  ?LOWER EXTREMITY SPECIAL TESTS:  ?Windlass test: (+) BIL ?Step-down test: (+) p! On Lt ?Too many toes sign: (+) BIL ?Figure 8 swelling measure: Rt: 51cm, Lt: 52cm ?  ?FUNCTIONAL TESTS:  ?Squat: 75%,no pain ?5xSTS: 18 seconds ?SLS: Lt: 4 seconds before LOB, Rt: 2 seconds before LOB ?DL heel raise x25: x20 before stopping due to pain/ fatigue ?SL heel raise x10: Rt: x3 with poor form and pain, Lt: unable to perform due to weakness/ pain ?  ?  01/14/2022: SL heel raise x10: WNL BIL ? ?  02/14/2022: 5xSTS: 12 seconds   ? ?GAIT: ?Distance walked: 20 ft ?Assistive device utilized: None ?Level of assistance: Complete Independence ?Comments: Pt walks on lateral aspect of BIL feet, antalgic gait ?  ?  ?  ?TODAY'S TREATMENT: ? ?[facility] PT Treatment:                                                DATE: 02/18/2022 ?Therapeutic Exercise: ?Bouncing heel raises on leg press machine with 40# 3x20 ?Alternating forward lunges with GTB Pallof press 2x10 BIL each side ?Tandem stance on Airex pad with 3kg ball swings 3x20 ?SLS with inside of foot off edge of Airex pad with quick contralateral knee drives 3H54 ?Manual Therapy: ?N/A ?Neuromuscular re-ed: ?N/A ?Therapeutic Activity: ?N/A ?Modalities: ?GameReady vasopneumatic treatment at 34d F to Lt foot x10 minutes ?Self Care: ?N/A ? ? ?[place] Adult PT Treatment:                                                 DATE: 02/14/2022 ?Therapeutic Exercise: ?Seated inversion towel slides with 5# dumbbell on towel x3 full length of towel BIL ?Kickstand stance  with primary stance foot on edge of Airex while performing lateral 10# kettlebell hand-offs 2x20 BIL ?Standing great toe extension stretch with forward lunge on edge of Airex pad x84mn BIL ?Bouncing heel raises with tennis ball beneath medial malleoli 3x30 ?SL dead lift with 10# kettlebell and UE support 2x8 BIL ?Manual Therapy: ?N/A ?Neuromuscular re-ed: ?N/A ?Therapeutic Activity: ?Re-assessment of objective information with pt education about progress and POC ?Modalities: ?N/A ?Self Care: ?N/A ? ? ?[facility]:                                                DATE: 02/04/2022 ?Therapeutic Exercise: ?Long-sitting plantar fascia stretch x28m BIL ?Seated ankle inversion/PF combined motion against YTB resistance 2x10 with 5-sec hold BIL ?Seated towel scrunches x2 length of towel BIL ?Standing heel raises on Airex pad 3x15 ?Standing gastrocnemius stretch x2 minutes ?Manual Therapy: ?N/A ?Neuromuscular re-ed: ?N/A ?Therapeutic Activity: ?N/A ?Modalities: ?N/A ?Self Care: ?N/A ? ? ? ?  ?  ?PATIENT EDUCATION:  ?Education details: See therapeutic activity ?Person educated: Patient ?Education method: Explanation ?Education comprehension: verbalized understanding ?  ?  ?HOME EXERCISE PROGRAM: ?Access Code: [email or web address] ?URL: [email or web address] ?Date: 12/17/2021 ?Prepared by: TuVanessa [place]  ?Exercises ?Heel Raises with Counter Support - 1 x daily - 7 x weekly - 3 sets - 15 reps ?Long Sitting Plantar Fascia Stretch with Towel - 1 x daily - 7 x weekly - 2 sets - 1-min hold ?Seated Plantar Fascia Stretch - 1 x daily - 7 x weekly - 2 sets - 1-min hold ?Gastroc Stretch on Wall - 1 x daily - 7 x weekly - 2 sets - 1-min hold ? ?Added 01/14/2022: ?Single Leg Heel Raise on Step - 1 x daily - 7 x weekly - 3 sets - 10 reps - 3-sec  hold ?  ?  ?ASSESSMENT: ?  ?CLINICAL IMPRESSION: ?Due to pt arriving 10 minutes late to her appointment, the session was truncated today. She responded well to interventions today, demonstrating good form and no incre

## 2022-02-25 ENCOUNTER — Ambulatory Visit (INDEPENDENT_AMBULATORY_CARE_PROVIDER_SITE_OTHER): Payer: No Typology Code available for payment source | Admitting: Podiatry

## 2022-02-25 ENCOUNTER — Ambulatory Visit: Payer: No Typology Code available for payment source | Admitting: Podiatry

## 2022-02-25 DIAGNOSIS — M5417 Radiculopathy, lumbosacral region: Secondary | ICD-10-CM

## 2022-02-25 DIAGNOSIS — G5760 Lesion of plantar nerve, unspecified lower limb: Secondary | ICD-10-CM | POA: Diagnosis not present

## 2022-02-25 DIAGNOSIS — G5753 Tarsal tunnel syndrome, bilateral lower limbs: Secondary | ICD-10-CM | POA: Diagnosis not present

## 2022-02-25 MED ORDER — GABAPENTIN 300 MG PO CAPS: 300.0000 mg | ORAL_CAPSULE | Freq: Every day | ORAL | 2 refills | Status: DC

## 2022-02-25 NOTE — Progress Notes (Signed)
?Subjective:  ?Patient ID: Stacey Palmer, female    DOB: 09/30/1981,  MRN: [id number] ? ?Chief Complaint  ?Patient presents with  ? Plantar Fasciitis  ?     Bilateral foot/ankle pain reevaluation  ? ? ?41 y.o. female returns with the above complaint. History confirmed with patient.   She started physical therapy continues to be very painful and feels like there is burning tingling numbness shooting down from her legs into the feet now ? ?Objective:  ?Physical Exam: ?warm, good capillary refill, no trophic changes or ulcerative lesions, normal DP and PT pulses and normal sensory exam.  Today she has pain on palpation and percussion to the tibial nerve in the tarsal tunnel as well as compression of the lateral plantar nerve at the heel left is worse than right ? ?Radiographs: ?X-ray of both feet: no fracture, dislocation, swelling or degenerative changes noted, hallux valgus deformity and pes planus ? ?Study Result ? ?Narrative & Impression  ?CLINICAL DATA:  Heel pain and burning since March 2022. ?  ?EXAM: ?MR OF THE LEFT HEEL WITHOUT CONTRAST ?  ?TECHNIQUE: ?Multiplanar, multisequence MR imaging of the right heel was ?performed. No intravenous contrast was administered. ?  ?COMPARISON:  None. ?  ?FINDINGS: ?TENDONS ?  ?Peroneal: Mild tendinosis and tenosynovitis of the peroneus longus. ?Peroneal brevis intact. ?  ?Posteromedial: Posterior tibial tendon intact. Flexor hallucis ?longus tendon intact. Flexor digitorum longus tendon intact. ?  ?Anterior: Tibialis anterior tendon intact. Extensor hallucis longus ?tendon intact Extensor digitorum longus tendon intact. ?  ?Achilles:  Intact. ?  ?Plantar Fascia: Plantar fascia is intact. Mild muscle edema in the ?abductor hallucis muscle and flexor digitorum brevis muscle ?concerning for muscle strain. ?  ?LIGAMENTS ?  ?Lateral: Chronic partial tear of the anterior talofibular ligament. ?Calcaneofibular ligament intact. Posterior talofibular ligament ?intact. Anterior and  posterior tibiofibular ligaments intact. ?  ?Medial: Chronic partial tear of the deltoid ligament. Spring ?ligament intact. ?  ?CARTILAGE ?  ?Ankle Joint: No joint effusion. Partial-thickness cartilage loss of ?the talofibular joint. ?  ?Subtalar Joints/Sinus Tarsi: Normal subtalar joints. No subtalar ?joint effusion. Normal sinus tarsi. ?  ?Bones: No acute fracture or dislocation. Relative pes planus. Small ?plantar calcaneal spur. Subcortical reactive marrow changes in the ?medial malleolus adjacent to the deltoid ligament insertion through ?  ?Soft Tissue: No fluid collection or hematoma. Mild muscle edema in ?the abductor hallucis muscle and flexor digitorum brevis muscle ?concerning for muscle strain. Tarsal tunnel is normal. ?  ?IMPRESSION: ?1. Mild tendinosis and tenosynovitis of the peroneus longus. ?2. Mild muscle edema in the abductor hallucis muscle and flexor ?digitorum brevis muscle concerning for muscle strain. ?  ?  ?Electronically Signed ?  By: Elige Ko M.D. ?  On: 09/02/2021 07:17  ? ?Assessment:  ? ?1. Tarsal tunnel syndrome, bilateral   ?2. Lateral plantar neuropathy, unspecified laterality   ?3. Lumbosacral radiculopathy   ? ? ? ?Plan:  ?Patient was evaluated and treated and all questions answered. ? ?I discussed with her today that there are ongoing pain she should have improved with physical therapy and the treatment she had thus far.  So far no treatment has been helpful for [place] fasciitis.  She describes today more burning and tingling pain and she has a positive Tinel's sign in the tarsal tunnel.  I do think some of this could be neurologic in nature and I have recommended a consult with neurology and EMG and NCV and referrals were sent for this.  I will  see her back after her studies for further treatment.  At this point she still is not able to work and she will continue her short-term disability. ? ?Return for f/u after neurology consult / test .  ?

## 2022-02-25 NOTE — Therapy (Incomplete)
?OUTPATIENT PHYSICAL THERAPY TREATMENT NOTE ? ? ?Patient Name: Stacey Palmer ?MRN: [id number] ?DOB:1981-02-16, 41 y.o., female ?Today's Date: 02/25/2022 ? ?PCP: Pcp, No ?REFERRING PROVIDER: No ref. provider found ? ? ? ? ? ? ? ? ? ? ? ? ?Past Medical History:  ?Diagnosis Date  ? Allergy   ? Anxiety   ? Depression, major, single episode, in partial remission ([place]) 12/26/2018  ? pHQ 9 SCORE OF 12 IN 12/2018, score of 0 in 03/2019 on medication  ? Dry eyes 12/26/2018  ? H/O gastric sleeve 2018  ? History of mammogram 2021  ? Obesity   ? Plantar fasciitis   ? Sleep apnea   ? ?Past Surgical History:  ?Procedure Laterality Date  ? BARIATRIC SURGERY  2018  ? CESAREAN SECTION    ? TUBAL LIGATION    ? ?Patient Active Problem List  ? Diagnosis Date Noted  ? Encounter for screening fecal occult blood testing 01/28/2022  ? Feeling sad 01/28/2022  ? Family planning 01/28/2022  ? Pregnancy examination or test, negative result 01/28/2022  ? Encounter for gynecological examination with Papanicolaou smear of cervix 01/28/2022  ? Abdominal cramps 12/16/2020  ? COVID-19 virus infection 11/30/2020  ? 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 Vaginal irritation 09/18/2020  ? Hip pain, right 09/16/2020  ? Snoring 09/16/2020  ? Labial skin tag 09/13/2020  ? Fatigue 12/06/2019  ? Allergies 12/06/2019  ? Hair loss 12/06/2019  ? GAD (generalized anxiety disorder) 12/26/2018  ? Gastroesophageal reflux disease 06/04/2017  ? Vitamin D deficiency 10/21/2016  ? 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 Obesity (BMI 30-39.9) 06/11/2015  ? 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 Skin tag 06/05/2014  ? Allergic rhinitis 06/05/2014  ? URI (upper respiratory infection) 08/23/2012  ? Low back pain 04/04/2010  ? Morbid obesity ([place]) 02/10/2008  ? ? ?REFERRING DIAG: : M72.2 (ICD-10-CM) - Plantar fasciitis ? ?THERAPY DIAG:  ?No diagnosis found. ? ?PERTINENT HISTORY: N/A ? ?PRECAUTIONS: None ? ?ONSET DATE: 01/2021 ? ?SUBJECTIVE: *** ? ?PAIN:  ?Are you having  pain? Yes ?NPRS scale: 8/10 ?Pain location: Lt medial ankle ?PAIN TYPE: burning ?Pain description: intermittent  ?Aggravating factors: prolonged standing >15 minutes, stair negotiation, prolonged standing >15 minutes ?Relieving factors: pain medication and rest ? ? ? ?OBJECTIVE:  ? *Unless otherwise noted, objective information collected previously* ? ?DIAGNOSTIC FINDINGS: 08/29/2022: MR Heel Left without Contrast: IMPRESSION: ?1. Mild tendinosis and tenosynovitis of the peroneus longus. ?2. Mild muscle edema in the abductor hallucis muscle and flexor ?digitorum brevis muscle concerning for muscle strain. ?  ?PATIENT SURVEYS:  ?FOTO 48%, predicted 67% in 14 visits ?01/16/2022: 67% (GOAL MET) ?  ?COGNITION: ?         Overall cognitive status: Within functional limits for tasks assessed              ?          ?SENSATION: ?         Light touch: Appears intact ?          ?  ?MUSCLE LENGTH: ?Gastrocnemius: ?Soleus: ?  ?POSTURE:  ?Pt stands with BIL pes planus ?  ?PALPATION: ?TTP to BIL plantar heel and medial arch ? ?01/14/2022: Additionally TTP to BIL metatarsal heads ?  ?LE AROM/PROM: ?  ?A/PROM Right ?12/17/2021 Left ?12/17/2021 Right ?01/14/2022 Left ?01/14/2022 Right ?02/14/2022 Left ?02/14/2022  ?Ankle dorsiflexion -6/6 2/5 3/10 2/5 5/10 5/10  ?Ankle plantarflexion 50/50 45/45   45/55 45/50  ?Ankle inversion 25/45 35/50   25/45  40/60  ?Ankle eversion 25/45 10/35   10/34 10/35  ? (Blank rows = not tested) ?  ?LE MMT: ?  ?MMT Right ?12/17/2021 Left ?12/17/2021 Right ?01/02/2022 Left ?01/02/2022 Right ?02/04/2022 Left ?02/04/2022  ?Hip flexion 4/5 4/5 4/5 4/5    ?Hip extension 3/5 3/5      ?Hip abduction 3+/5 3+/5 4/5 4/5    ?Knee flexion 5/5 5/5      ?Knee extension 5/5 5/5      ?Ankle dorsiflexion 4/5 4/5   4+/5 4+/5  ?Ankle plantarflexion 5/5p! 5/5p!   5/5 5/5  ?Ankle inversion 4/5p! 4/5p!   4/5p! 4+/5p!  ?Ankle eversion 4/5p! 4/5p!   4/5p! 4+/5  ? (Blank rows = not tested) ?  ?LOWER EXTREMITY SPECIAL TESTS:  ?Windlass test: (+)  BIL ?Step-down test: (+) p! On Lt ?Too many toes sign: (+) BIL ?Figure 8 swelling measure: Rt: 51cm, Lt: 52cm ?  ?FUNCTIONAL TESTS:  ?Squat: 75%,no pain ?5xSTS: 18 seconds ?SLS: Lt: 4 seconds before LOB, Rt: 2 seconds before LOB ?DL heel raise x25: x20 before stopping due to pain/ fatigue ?SL heel raise x10: Rt: x3 with poor form and pain, Lt: unable to perform due to weakness/ pain ?  ?  01/14/2022: SL heel raise x10: WNL BIL ? ?  02/14/2022: 5xSTS: 12 seconds   ? ?GAIT: ?Distance walked: 20 ft ?Assistive device utilized: None ?Level of assistance: Complete Independence ?Comments: Pt walks on lateral aspect of BIL feet, antalgic gait ?  ?  ?  ?TODAY'S TREATMENT: ? ?[place] Adult PT Treatment:                                                DATE: 02/26/2022 ?Therapeutic Exercise: ?*** ?Manual Therapy: ?*** ?Neuromuscular re-ed: ?*** ?Therapeutic Activity: ?*** ?Modalities: ?*** ?Self Care: ?*** ? ? ?[place] Adult PT Treatment:                                                DATE: 02/18/2022 ?Therapeutic Exercise: ?Bouncing heel raises on leg press machine with 40# 3x20 ?Alternating forward lunges with GTB Pallof press 2x10 BIL each side ?Tandem stance on Airex pad with 3kg ball swings 3x20 ?SLS with inside of foot off edge of Airex pad with quick contralateral knee drives 5I62 ?Manual Therapy: ?N/A ?Neuromuscular re-ed: ?N/A ?Therapeutic Activity: ?N/A ?Modalities: ?GameReady vasopneumatic treatment at 34d F to Lt foot x10 minutes ?Self Care: ?N/A ? ? ?[place] Adult PT Treatment:                                                DATE: 02/14/2022 ?Therapeutic Exercise: ?Seated inversion towel slides with 5# dumbbell on towel x3 full length of towel BIL ?Kickstand stance with primary stance foot on edge of Airex while performing lateral 10# kettlebell hand-offs 2x20 BIL ?Standing great toe extension stretch with forward lunge on edge of Airex pad x87mn BIL ?Bouncing heel raises with tennis ball beneath medial malleoli 3x30 ?SL dead lift  with 10# kettlebell and UE support 2x8 BIL ?Manual Therapy: ?N/A ?Neuromuscular re-ed: ?N/A ?Therapeutic Activity: ?Re-assessment of objective information  with pt education about progress and POC ?Modalities: ?N/A ?Self Care: ?N/A ? ? ? ?  ?  ?PATIENT EDUCATION:  ?Education details: See therapeutic activity ?Person educated: Patient ?Education method: Explanation ?Education comprehension: verbalized understanding ?  ?  ?HOME EXERCISE PROGRAM: ?Access Code: [email or web address] ?URL: [email or web address] ?Date: 12/17/2021 ?Prepared by: Vanessa [place] ?  ?Exercises ?Heel Raises with Counter Support - 1 x daily - 7 x weekly - 3 sets - 15 reps ?Long Sitting Plantar Fascia Stretch with Towel - 1 x daily - 7 x weekly - 2 sets - 1-min hold ?Seated Plantar Fascia Stretch - 1 x daily - 7 x weekly - 2 sets - 1-min hold ?Gastroc Stretch on Wall - 1 x daily - 7 x weekly - 2 sets - 1-min hold ? ?Added 01/14/2022: ?Single Leg Heel Raise on Step - 1 x daily - 7 x weekly - 3 sets - 10 reps - 3-sec hold ?  ?  ?ASSESSMENT: ?  ?CLINICAL IMPRESSION: ?*** ? ?  ?REHAB POTENTIAL: Good ?  ?CLINICAL DECISION MAKING: Stable/uncomplicated ?  ?EVALUATION COMPLEXITY: Low ?  ?  ?GOALS: ?Goals reviewed with patient? No ?  ?SHORT TERM GOALS: ?  ?STG Name Target Date Goal status  ?1 Pt will report understanding and adherence to her HEP in order to promote independence in the management of her primary impairments. ?Baseline: HEP provided at eval ?01/14/2022: Pt reports daily adherence to her HEP 02/11/2022 ACHIEVED  ?  ?LONG TERM GOALS:  ?  ?LTG Name Target Date Goal status  ?1 Pt will demonstrate 10 SL heel raises BIL in order to reach into overhead cabinets at work with less limitation. ?Baseline: 3 on Rt, unable on Lt ?01/23/2022: Pt achieves 3x10 SL heel raises BIL 02/11/2022 ACHIEVED  ?2 Pt will achieve a FOTO score of 67% in order to demonstrate improved functional ability as it relates to her foot pain. ?Baseline: 48% ?01/16/2022: 67%  02/11/2022 ACHIEVED  ?3 Pt will achieve BIL ankle dorsiflexion of 10 degrees in order to promote WNL gait. ?Baseline: -6 on Rt, 2 on Lt ?01/14/2022: 3 on Rt, 2 on Lt ?02/14/2022: 5 BIL 02/11/2022 IN PROGRESS  ?4 Pt w

## 2022-02-26 ENCOUNTER — Encounter: Payer: Self-pay | Admitting: Neurology

## 2022-02-26 ENCOUNTER — Telehealth: Payer: Self-pay | Admitting: Podiatry

## 2022-02-26 NOTE — Telephone Encounter (Signed)
Pt was given information and will call to schedule for sooner appt. She states that she will not be going back to work because its too much on her feet.  ?

## 2022-02-26 NOTE — Addendum Note (Signed)
Addended byLilian Kapur,  R on: 02/26/2022 09:26 AM ? ? Modules accepted: Orders ? ?

## 2022-02-26 NOTE — Telephone Encounter (Signed)
I will change her referral to [facility] to see if they can get her in sooner. She should call  [phone] to schedule. I would prefer it to be done sooner

## 2022-02-26 NOTE — Telephone Encounter (Signed)
Pt states that she was able to schedule appt with neurologist in Sept due to the provider being out on maternity leave. She states she is still unable to return to work in her current condition. She would like a work note to state that she is unable to work at this time and that she would like to return after she sees the neurologist. ? ?Please advise. ?

## 2022-02-27 NOTE — Therapy (Incomplete)
?OUTPATIENT PHYSICAL THERAPY TREATMENT NOTE ? ? ?Patient Name: Stacey Palmer ?MRN: [id number] ?DOB:10/11/1981, 41 y.o., female ?Today's Date: 02/27/2022 ? ?PCP: Pcp, No ?REFERRING PROVIDER: Criselda Peaches, DPM ? ? ? ? ? ? ? ? ? ? ? ? ?Past Medical History:  ?Diagnosis Date  ? Allergy   ? Anxiety   ? Depression, major, single episode, in partial remission ([place]) 12/26/2018  ? pHQ 9 SCORE OF 12 IN 12/2018, score of 0 in 03/2019 on medication  ? Dry eyes 12/26/2018  ? H/O gastric sleeve 2018  ? History of mammogram 2021  ? Obesity   ? Plantar fasciitis   ? Sleep apnea   ? ?Past Surgical History:  ?Procedure Laterality Date  ? BARIATRIC SURGERY  2018  ? CESAREAN SECTION    ? TUBAL LIGATION    ? ?Patient Active Problem List  ? Diagnosis Date Noted  ? Encounter for screening fecal occult blood testing 01/28/2022  ? Feeling sad 01/28/2022  ? Family planning 01/28/2022  ? Pregnancy examination or test, negative result 01/28/2022  ? Encounter for gynecological examination with Papanicolaou smear of cervix 01/28/2022  ? Abdominal cramps 12/16/2020  ? COVID-19 virus infection 11/30/2020  ? 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 Vaginal irritation 09/18/2020  ? Hip pain, right 09/16/2020  ? Snoring 09/16/2020  ? Labial skin tag 09/13/2020  ? Fatigue 12/06/2019  ? Allergies 12/06/2019  ? Hair loss 12/06/2019  ? GAD (generalized anxiety disorder) 12/26/2018  ? Gastroesophageal reflux disease 06/04/2017  ? Vitamin D deficiency 10/21/2016  ? 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 Obesity (BMI 30-39.9) 06/11/2015  ? 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 Skin tag 06/05/2014  ? Allergic rhinitis 06/05/2014  ? URI (upper respiratory infection) 08/23/2012  ? Low back pain 04/04/2010  ? Morbid obesity ([place]) 02/10/2008  ? ? ?REFERRING DIAG: : M72.2 (ICD-10-CM) - Plantar fasciitis ? ?THERAPY DIAG:  ?No diagnosis found. ? ?PERTINENT HISTORY: N/A ? ?PRECAUTIONS: None ? ?ONSET DATE: 01/2021 ? ?SUBJECTIVE: *** ? ?PAIN:  ?Are you having pain?  Yes ?NPRS scale: 8/10 ?Pain location: Lt medial ankle ?PAIN TYPE: burning ?Pain description: intermittent  ?Aggravating factors: prolonged standing >15 minutes, stair negotiation, prolonged standing >15 minutes ?Relieving factors: pain medication and rest ? ? ? ?OBJECTIVE:  ? *Unless otherwise noted, objective information collected previously* ? ?DIAGNOSTIC FINDINGS: 08/29/2022: MR Heel Left without Contrast: IMPRESSION: ?1. Mild tendinosis and tenosynovitis of the peroneus longus. ?2. Mild muscle edema in the abductor hallucis muscle and flexor ?digitorum brevis muscle concerning for muscle strain. ?  ?PATIENT SURVEYS:  ?FOTO 48%, predicted 67% in 14 visits ?01/16/2022: 67% (GOAL MET) ?  ?COGNITION: ?         Overall cognitive status: Within functional limits for tasks assessed              ?          ?SENSATION: ?         Light touch: Appears intact ?          ?  ?MUSCLE LENGTH: ?Gastrocnemius: ?Soleus: ?  ?POSTURE:  ?Pt stands with BIL pes planus ?  ?PALPATION: ?TTP to BIL plantar heel and medial arch ? ?01/14/2022: Additionally TTP to BIL metatarsal heads ?  ?LE AROM/PROM: ?  ?A/PROM Right ?12/17/2021 Left ?12/17/2021 Right ?01/14/2022 Left ?01/14/2022 Right ?02/14/2022 Left ?02/14/2022  ?Ankle dorsiflexion -6/6 2/5 3/10 2/5 5/10 5/10  ?Ankle plantarflexion 50/50 45/45   45/55 45/50  ?Ankle inversion 25/45 35/50   25/45  40/60  ?Ankle eversion 25/45 10/35   10/34 10/35  ? (Blank rows = not tested) ?  ?LE MMT: ?  ?MMT Right ?12/17/2021 Left ?12/17/2021 Right ?01/02/2022 Left ?01/02/2022 Right ?02/04/2022 Left ?02/04/2022  ?Hip flexion 4/5 4/5 4/5 4/5    ?Hip extension 3/5 3/5      ?Hip abduction 3+/5 3+/5 4/5 4/5    ?Knee flexion 5/5 5/5      ?Knee extension 5/5 5/5      ?Ankle dorsiflexion 4/5 4/5   4+/5 4+/5  ?Ankle plantarflexion 5/5p! 5/5p!   5/5 5/5  ?Ankle inversion 4/5p! 4/5p!   4/5p! 4+/5p!  ?Ankle eversion 4/5p! 4/5p!   4/5p! 4+/5  ? (Blank rows = not tested) ?  ?LOWER EXTREMITY SPECIAL TESTS:  ?Windlass test: (+)  BIL ?Step-down test: (+) p! On Lt ?Too many toes sign: (+) BIL ?Figure 8 swelling measure: Rt: 51cm, Lt: 52cm ?  ?FUNCTIONAL TESTS:  ?Squat: 75%,no pain ?5xSTS: 18 seconds ?SLS: Lt: 4 seconds before LOB, Rt: 2 seconds before LOB ?DL heel raise x25: x20 before stopping due to pain/ fatigue ?SL heel raise x10: Rt: x3 with poor form and pain, Lt: unable to perform due to weakness/ pain ?  ?  01/14/2022: SL heel raise x10: WNL BIL ? ?  02/14/2022: 5xSTS: 12 seconds   ? ?GAIT: ?Distance walked: 20 ft ?Assistive device utilized: None ?Level of assistance: Complete Independence ?Comments: Pt walks on lateral aspect of BIL feet, antalgic gait ?  ?  ?  ?TODAY'S TREATMENT: ? ?[facility] Adult PT Treatment:                                                DATE: 02/28/2022 ?Therapeutic Exercise: ?*** ?Manual Therapy: ?*** ?Neuromuscular re-ed: ?*** ?Therapeutic Activity: ?*** ?Modalities: ?*** ?Self Care: ?*** ? ? ?[facility] Adult PT Treatment:                                                DATE: 02/18/2022 ?Therapeutic Exercise: ?Bouncing heel raises on leg press machine with 40# 3x20 ?Alternating forward lunges with GTB Pallof press 2x10 BIL each side ?Tandem stance on Airex pad with 3kg ball swings 3x20 ?SLS with inside of foot off edge of Airex pad with quick contralateral knee drives [id number] ?Manual Therapy: ?N/A ?Neuromuscular re-ed: ?N/A ?Therapeutic Activity: ?N/A ?Modalities: ?GameReady vasopneumatic treatment at 34d F to Lt foot x10 minutes ?Self Care: ?N/A ? ? ?[facility] Adult PT Treatment:                                                DATE: 02/14/2022 ?Therapeutic Exercise: ?Seated inversion towel slides with 5# dumbbell on towel x3 full length of towel BIL ?Kickstand stance with primary stance foot on edge of Airex while performing lateral 10# kettlebell hand-offs 2x20 BIL ?Standing great toe extension stretch with forward lunge on edge of Airex pad x52mn BIL ?Bouncing heel raises with tennis ball beneath medial malleoli 3x30 ?SL dead lift  with 10# kettlebell and UE support 2x8 BIL ?Manual Therapy: ?N/A ?Neuromuscular re-ed: ?N/A ?Therapeutic Activity: ?Re-assessment of objective information  with pt education about progress and POC ?Modalities: ?N/A ?Self Care: ?N/A ? ? ? ?  ?  ?PATIENT EDUCATION:  ?Education details: See therapeutic activity ?Person educated: Patient ?Education method: Explanation ?Education comprehension: verbalized understanding ?  ?  ?HOME EXERCISE PROGRAM: ?Access Code: [email or web address] ?URL: [email or web address] ?Date: 12/17/2021 ?Prepared by: Vanessa [place] ?  ?Exercises ?Heel Raises with Counter Support - 1 x daily - 7 x weekly - 3 sets - 15 reps ?Long Sitting Plantar Fascia Stretch with Towel - 1 x daily - 7 x weekly - 2 sets - 1-min hold ?Seated Plantar Fascia Stretch - 1 x daily - 7 x weekly - 2 sets - 1-min hold ?Gastroc Stretch on Wall - 1 x daily - 7 x weekly - 2 sets - 1-min hold ? ?Added 01/14/2022: ?Single Leg Heel Raise on Step - 1 x daily - 7 x weekly - 3 sets - 10 reps - 3-sec hold ?  ?  ?ASSESSMENT: ?  ?CLINICAL IMPRESSION: ?*** ? ?  ?REHAB POTENTIAL: Good ?  ?CLINICAL DECISION MAKING: Stable/uncomplicated ?  ?EVALUATION COMPLEXITY: Low ?  ?  ?GOALS: ?Goals reviewed with patient? No ?  ?SHORT TERM GOALS: ?  ?STG Name Target Date Goal status  ?1 Pt will report understanding and adherence to her HEP in order to promote independence in the management of her primary impairments. ?Baseline: HEP provided at eval ?01/14/2022: Pt reports daily adherence to her HEP 02/11/2022 ACHIEVED  ?  ?LONG TERM GOALS:  ?  ?LTG Name Target Date Goal status  ?1 Pt will demonstrate 10 SL heel raises BIL in order to reach into overhead cabinets at work with less limitation. ?Baseline: 3 on Rt, unable on Lt ?01/23/2022: Pt achieves 3x10 SL heel raises BIL 02/11/2022 ACHIEVED  ?2 Pt will achieve a FOTO score of 67% in order to demonstrate improved functional ability as it relates to her foot pain. ?Baseline: 48% ?01/16/2022: 67%  02/11/2022 ACHIEVED  ?3 Pt will achieve BIL ankle dorsiflexion of 10 degrees in order to promote WNL gait. ?Baseline: -6 on Rt, 2 on Lt ?01/14/2022: 3 on Rt, 2 on Lt ?02/14/2022: 5 BIL 02/11/2022 IN PROGRESS  ?4 Pt wi

## 2022-02-28 ENCOUNTER — Ambulatory Visit: Payer: No Typology Code available for payment source

## 2022-02-28 ENCOUNTER — Ambulatory Visit (HOSPITAL_BASED_OUTPATIENT_CLINIC_OR_DEPARTMENT_OTHER): Payer: No Typology Code available for payment source | Admitting: Nurse Practitioner

## 2022-03-01 ENCOUNTER — Encounter: Payer: No Typology Code available for payment source | Admitting: Physical Therapy

## 2022-03-03 ENCOUNTER — Ambulatory Visit (HOSPITAL_BASED_OUTPATIENT_CLINIC_OR_DEPARTMENT_OTHER): Payer: No Typology Code available for payment source | Admitting: Nurse Practitioner

## 2022-03-03 ENCOUNTER — Encounter: Payer: No Typology Code available for payment source | Admitting: Physical Therapy

## 2022-03-04 NOTE — Therapy (Incomplete)
?OUTPATIENT PHYSICAL THERAPY TREATMENT NOTE ? ? ?Patient Name: Stacey Palmer ?MRN: [id number] ?DOB:11/21/1980, 41 y.o., female ?Today's Date: 03/04/2022 ? ?PCP: Pcp, No ?REFERRING PROVIDER: Criselda Peaches, DPM ? ? ? ? ? ? ? ? ? ? ? ? ?Past Medical History:  ?Diagnosis Date  ? Allergy   ? Anxiety   ? Depression, major, single episode, in partial remission ([place]) 12/26/2018  ? pHQ 9 SCORE OF 12 IN 12/2018, score of 0 in 03/2019 on medication  ? Dry eyes 12/26/2018  ? H/O gastric sleeve 2018  ? History of mammogram 2021  ? Obesity   ? Plantar fasciitis   ? Sleep apnea   ? ?Past Surgical History:  ?Procedure Laterality Date  ? BARIATRIC SURGERY  2018  ? CESAREAN SECTION    ? TUBAL LIGATION    ? ?Patient Active Problem List  ? Diagnosis Date Noted  ? Encounter for screening fecal occult blood testing 01/28/2022  ? Feeling sad 01/28/2022  ? Family planning 01/28/2022  ? Pregnancy examination or test, negative result 01/28/2022  ? Encounter for gynecological examination with Papanicolaou smear of cervix 01/28/2022  ? Abdominal cramps 12/16/2020  ? COVID-19 virus infection 11/30/2020  ? 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 Vaginal irritation 09/18/2020  ? Hip pain, right 09/16/2020  ? Snoring 09/16/2020  ? Labial skin tag 09/13/2020  ? Fatigue 12/06/2019  ? Allergies 12/06/2019  ? Hair loss 12/06/2019  ? GAD (generalized anxiety disorder) 12/26/2018  ? Gastroesophageal reflux disease 06/04/2017  ? Vitamin D deficiency 10/21/2016  ? 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 Obesity (BMI 30-39.9) 06/11/2015  ? 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 Skin tag 06/05/2014  ? Allergic rhinitis 06/05/2014  ? URI (upper respiratory infection) 08/23/2012  ? Low back pain 04/04/2010  ? Morbid obesity ([place]) 02/10/2008  ? ? ?REFERRING DIAG: : M72.2 (ICD-10-CM) - Plantar fasciitis ? ?THERAPY DIAG:  ?No diagnosis found. ? ?PERTINENT HISTORY: N/A ? ?PRECAUTIONS: None ? ?ONSET DATE: 01/2021 ? ?SUBJECTIVE: Pt reports continued 8/10 pain in  BIL feet. She reports she had increased swelling in BIL feet on Sunday after increased walking.  ? ?PAIN:  ?Are you having pain? Yes ?NPRS scale: 8/10 ?Pain location: Lt medial ankle ?PAIN TYPE: burning ?Pain description: intermittent  ?Aggravating factors: prolonged standing >15 minutes, stair negotiation, prolonged standing >15 minutes ?Relieving factors: pain medication and rest ? ? ? ?OBJECTIVE:  ? *Unless otherwise noted, objective information collected previously* ? ?DIAGNOSTIC FINDINGS: 08/29/2022: MR Heel Left without Contrast: IMPRESSION: ?1. Mild tendinosis and tenosynovitis of the peroneus longus. ?2. Mild muscle edema in the abductor hallucis muscle and flexor ?digitorum brevis muscle concerning for muscle strain. ?  ?PATIENT SURVEYS:  ?FOTO 48%, predicted 67% in 14 visits ?01/16/2022: 67% (GOAL MET) ?  ?COGNITION: ?         Overall cognitive status: Within functional limits for tasks assessed              ?          ?SENSATION: ?         Light touch: Appears intact ?          ?  ?MUSCLE LENGTH: ?Gastrocnemius: ?Soleus: ?  ?POSTURE:  ?Pt stands with BIL pes planus ?  ?PALPATION: ?TTP to BIL plantar heel and medial arch ? ?01/14/2022: Additionally TTP to BIL metatarsal heads ?  ?LE AROM/PROM: ?  ?A/PROM Right ?12/17/2021 Left ?12/17/2021 Right ?01/14/2022 Left ?01/14/2022 Right ?02/14/2022 Left ?02/14/2022  ?Ankle dorsiflexion -6/6  2/5 3/10 2/5 5/10 5/10  ?Ankle plantarflexion 50/50 45/45   45/55 45/50  ?Ankle inversion 25/45 35/50   25/45 40/60  ?Ankle eversion 25/45 10/35   10/34 10/35  ? (Blank rows = not tested) ?  ?LE MMT: ?  ?MMT Right ?12/17/2021 Left ?12/17/2021 Right ?01/02/2022 Left ?01/02/2022 Right ?02/04/2022 Left ?02/04/2022  ?Hip flexion 4/5 4/5 4/5 4/5    ?Hip extension 3/5 3/5      ?Hip abduction 3+/5 3+/5 4/5 4/5    ?Knee flexion 5/5 5/5      ?Knee extension 5/5 5/5      ?Ankle dorsiflexion 4/5 4/5   4+/5 4+/5  ?Ankle plantarflexion 5/5p! 5/5p!   5/5 5/5  ?Ankle inversion 4/5p! 4/5p!   4/5p! 4+/5p!   ?Ankle eversion 4/5p! 4/5p!   4/5p! 4+/5  ? (Blank rows = not tested) ?  ?LOWER EXTREMITY SPECIAL TESTS:  ?Windlass test: (+) BIL ?Step-down test: (+) p! On Lt ?Too many toes sign: (+) BIL ?Figure 8 swelling measure: Rt: 51cm, Lt: 52cm ?  ?FUNCTIONAL TESTS:  ?Squat: 75%,no pain ?5xSTS: 18 seconds ?SLS: Lt: 4 seconds before LOB, Rt: 2 seconds before LOB ?DL heel raise x25: x20 before stopping due to pain/ fatigue ?SL heel raise x10: Rt: x3 with poor form and pain, Lt: unable to perform due to weakness/ pain ?  ?  01/14/2022: SL heel raise x10: WNL BIL ? ?  02/14/2022: 5xSTS: 12 seconds   ? ?GAIT: ?Distance walked: 20 ft ?Assistive device utilized: None ?Level of assistance: Complete Independence ?Comments: Pt walks on lateral aspect of BIL feet, antalgic gait ?  ?  ?  ?TODAY'S TREATMENT: ?[facility] Adult PT Treatment:                                                DATE: 03/05/22 ?Therapeutic Exercise: ?*** ?Manual Therapy: ?*** ?Neuromuscular re-ed: ?*** ?Therapeutic Activity: ?*** ?Modalities: ?*** ?Self Care: ?*** ? ? ?[facility] Adult PT Treatment:                                                DATE: 02/18/2022 ?Therapeutic Exercise: ?Bouncing heel raises on leg press machine with 40# 3x20 ?Alternating forward lunges with GTB Pallof press 2x10 BIL each side ?Tandem stance on Airex pad with 3kg ball swings 3x20 ?SLS with inside of foot off edge of Airex pad with quick contralateral knee drives [id number] ?Manual Therapy: ?N/A ?Neuromuscular re-ed: ?N/A ?Therapeutic Activity: ?N/A ?Modalities: ?GameReady vasopneumatic treatment at 34d F to Lt foot x10 minutes ?Self Care: ?N/A ? ? ?[facility] Adult PT Treatment:                                                DATE: 02/14/2022 ?Therapeutic Exercise: ?Seated inversion towel slides with 5# dumbbell on towel x3 full length of towel BIL ?Kickstand stance with primary stance foot on edge of Airex while performing lateral 10# kettlebell hand-offs 2x20 BIL ?Standing great toe extension stretch with  forward lunge on edge of Airex pad x26mn BIL ?Bouncing heel raises with tennis ball beneath medial malleoli 3x30 ?SL dead  lift with 10# kettlebell and UE support 2x8 BIL ?Manual Therapy: ?N/A ?Neuromuscular re-ed: ?N/A ?Therapeutic Activity: ?Re-assessment of objective information with pt education about progress and POC ?Modalities: ?N/A ?Self Care: ?N/A ? ? ?[facility] Adult PT Treatment:                                                DATE: 02/04/2022 ?Therapeutic Exercise: ?Long-sitting plantar fascia stretch x27mn BIL ?Seated ankle inversion/PF combined motion against YTB resistance 2x10 with 5-sec hold BIL ?Seated towel scrunches x2 length of towel BIL ?Standing heel raises on Airex pad 3x15 ?Standing gastrocnemius stretch x2 minutes ?Manual Therapy: ?N/A ?Neuromuscular re-ed: ?N/A ?Therapeutic Activity: ?N/A ?Modalities: ?N/A ?Self Care: ?N/A ? ? ? ?  ?  ?PATIENT EDUCATION:  ?Education details: See therapeutic activity ?Person educated: Patient ?Education method: Explanation ?Education comprehension: verbalized understanding ?  ?  ?HOME EXERCISE PROGRAM: ?Access Code: [email or web address] ?URL: [email or web address] ?Date: 12/17/2021 ?Prepared by: TVanessa [place]?  ?Exercises ?Heel Raises with Counter Support - 1 x daily - 7 x weekly - 3 sets - 15 reps ?Long Sitting Plantar Fascia Stretch with Towel - 1 x daily - 7 x weekly - 2 sets - 1-min hold ?Seated Plantar Fascia Stretch - 1 x daily - 7 x weekly - 2 sets - 1-min hold ?Gastroc Stretch on Wall - 1 x daily - 7 x weekly - 2 sets - 1-min hold ? ?Added 01/14/2022: ?Single Leg Heel Raise on Step - 1 x daily - 7 x weekly - 3 sets - 10 reps - 3-sec hold ?  ?  ?ASSESSMENT: ?  ?CLINICAL IMPRESSION: ?Due to pt arriving 10 minutes late to her appointment, the session was truncated today. She responded well to interventions today, demonstrating good form and no increased pain with completed exercises. She also continues to report a therapeutic response to vasopneumatic  treatment. She will continue to benefit from skilled PT to address her primary impairments and return to her prior level of function with less limitation. ? ?  ?REHAB POTENTIAL: Good ?  ?CLINICAL DECISION MAKING: St

## 2022-03-05 ENCOUNTER — Ambulatory Visit: Payer: No Typology Code available for payment source

## 2022-03-05 NOTE — Therapy (Incomplete)
?OUTPATIENT PHYSICAL THERAPY TREATMENT NOTE ? ? ?Patient Name: Stacey Palmer ?MRN: [id number] ?DOB:02/01/1981, 41 y.o., female ?Today's Date: 03/05/2022 ? ?PCP: Pcp, No ?REFERRING PROVIDER: Criselda Peaches, DPM ? ? ? ? ? ? ? ? ? ? ? ? ?Past Medical History:  ?Diagnosis Date  ? Allergy   ? Anxiety   ? Depression, major, single episode, in partial remission ([place]) 12/26/2018  ? pHQ 9 SCORE OF 12 IN 12/2018, score of 0 in 03/2019 on medication  ? Dry eyes 12/26/2018  ? H/O gastric sleeve 2018  ? History of mammogram 2021  ? Obesity   ? Plantar fasciitis   ? Sleep apnea   ? ?Past Surgical History:  ?Procedure Laterality Date  ? BARIATRIC SURGERY  2018  ? CESAREAN SECTION    ? TUBAL LIGATION    ? ?Patient Active Problem List  ? Diagnosis Date Noted  ? Encounter for screening fecal occult blood testing 01/28/2022  ? Feeling sad 01/28/2022  ? Family planning 01/28/2022  ? Pregnancy examination or test, negative result 01/28/2022  ? Encounter for gynecological examination with Papanicolaou smear of cervix 01/28/2022  ? Abdominal cramps 12/16/2020  ? COVID-19 virus infection 11/30/2020  ? 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 Vaginal irritation 09/18/2020  ? Hip pain, right 09/16/2020  ? Snoring 09/16/2020  ? Labial skin tag 09/13/2020  ? Fatigue 12/06/2019  ? Allergies 12/06/2019  ? Hair loss 12/06/2019  ? GAD (generalized anxiety disorder) 12/26/2018  ? Gastroesophageal reflux disease 06/04/2017  ? Vitamin D deficiency 10/21/2016  ? 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 Obesity (BMI 30-39.9) 06/11/2015  ? 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 Skin tag 06/05/2014  ? Allergic rhinitis 06/05/2014  ? URI (upper respiratory infection) 08/23/2012  ? Low back pain 04/04/2010  ? Morbid obesity ([place]) 02/10/2008  ? ? ?REFERRING DIAG: : M72.2 (ICD-10-CM) - Plantar fasciitis ? ?THERAPY DIAG:  ?No diagnosis found. ? ?PERTINENT HISTORY: N/A ? ?PRECAUTIONS: None ? ?ONSET DATE: 01/2021 ? ?SUBJECTIVE: *** ? ?PAIN:  ?Are you having pain?  Yes ?NPRS scale: 8/10 ?Pain location: Lt medial ankle ?PAIN TYPE: burning ?Pain description: intermittent  ?Aggravating factors: prolonged standing >15 minutes, stair negotiation, prolonged standing >15 minutes ?Relieving factors: pain medication and rest ? ? ? ?OBJECTIVE:  ? *Unless otherwise noted, objective information collected previously* ? ?DIAGNOSTIC FINDINGS: 08/29/2022: MR Heel Left without Contrast: IMPRESSION: ?1. Mild tendinosis and tenosynovitis of the peroneus longus. ?2. Mild muscle edema in the abductor hallucis muscle and flexor ?digitorum brevis muscle concerning for muscle strain. ?  ?PATIENT SURVEYS:  ?FOTO 48%, predicted 67% in 14 visits ?01/16/2022: 67% (GOAL MET) ?  ?COGNITION: ?         Overall cognitive status: Within functional limits for tasks assessed              ?          ?SENSATION: ?         Light touch: Appears intact ?          ?  ?MUSCLE LENGTH: ?Gastrocnemius: ?Soleus: ?  ?POSTURE:  ?Pt stands with BIL pes planus ?  ?PALPATION: ?TTP to BIL plantar heel and medial arch ? ?01/14/2022: Additionally TTP to BIL metatarsal heads ?  ?LE AROM/PROM: ?  ?A/PROM Right ?12/17/2021 Left ?12/17/2021 Right ?01/14/2022 Left ?01/14/2022 Right ?02/14/2022 Left ?02/14/2022  ?Ankle dorsiflexion -6/6 2/5 3/10 2/5 5/10 5/10  ?Ankle plantarflexion 50/50 45/45   45/55 45/50  ?Ankle inversion 25/45 35/50   25/45  40/60  ?Ankle eversion 25/45 10/35   10/34 10/35  ? (Blank rows = not tested) ?  ?LE MMT: ?  ?MMT Right ?12/17/2021 Left ?12/17/2021 Right ?01/02/2022 Left ?01/02/2022 Right ?02/04/2022 Left ?02/04/2022  ?Hip flexion 4/5 4/5 4/5 4/5    ?Hip extension 3/5 3/5      ?Hip abduction 3+/5 3+/5 4/5 4/5    ?Knee flexion 5/5 5/5      ?Knee extension 5/5 5/5      ?Ankle dorsiflexion 4/5 4/5   4+/5 4+/5  ?Ankle plantarflexion 5/5p! 5/5p!   5/5 5/5  ?Ankle inversion 4/5p! 4/5p!   4/5p! 4+/5p!  ?Ankle eversion 4/5p! 4/5p!   4/5p! 4+/5  ? (Blank rows = not tested) ?  ?LOWER EXTREMITY SPECIAL TESTS:  ?Windlass test: (+)  BIL ?Step-down test: (+) p! On Lt ?Too many toes sign: (+) BIL ?Figure 8 swelling measure: Rt: 51cm, Lt: 52cm ?  ?FUNCTIONAL TESTS:  ?Squat: 75%,no pain ?5xSTS: 18 seconds ?SLS: Lt: 4 seconds before LOB, Rt: 2 seconds before LOB ?DL heel raise x25: x20 before stopping due to pain/ fatigue ?SL heel raise x10: Rt: x3 with poor form and pain, Lt: unable to perform due to weakness/ pain ?  ?  01/14/2022: SL heel raise x10: WNL BIL ? ?  02/14/2022: 5xSTS: 12 seconds   ? ?GAIT: ?Distance walked: 20 ft ?Assistive device utilized: None ?Level of assistance: Complete Independence ?Comments: Pt walks on lateral aspect of BIL feet, antalgic gait ?  ?  ?  ?TODAY'S TREATMENT: ? ?[facility] Adult PT Treatment:                                                DATE: 03/06/2022 ?Therapeutic Exercise: ?*** ?Manual Therapy: ?*** ?Neuromuscular re-ed: ?*** ?Therapeutic Activity: ?*** ?Modalities: ?*** ?Self Care: ?*** ? ? ?[facility] Adult PT Treatment:                                                DATE: 02/18/2022 ?Therapeutic Exercise: ?Bouncing heel raises on leg press machine with 40# 3x20 ?Alternating forward lunges with GTB Pallof press 2x10 BIL each side ?Tandem stance on Airex pad with 3kg ball swings 3x20 ?SLS with inside of foot off edge of Airex pad with quick contralateral knee drives [id number] ?Manual Therapy: ?N/A ?Neuromuscular re-ed: ?N/A ?Therapeutic Activity: ?N/A ?Modalities: ?GameReady vasopneumatic treatment at 34d F to Lt foot x10 minutes ?Self Care: ?N/A ? ? ?[place] Adult PT Treatment:                                                DATE: 02/14/2022 ?Therapeutic Exercise: ?Seated inversion towel slides with 5# dumbbell on towel x3 full length of towel BIL ?Kickstand stance with primary stance foot on edge of Airex while performing lateral 10# kettlebell hand-offs 2x20 BIL ?Standing great toe extension stretch with forward lunge on edge of Airex pad x66mn BIL ?Bouncing heel raises with tennis ball beneath medial malleoli 3x30 ?SL dead lift  with 10# kettlebell and UE support 2x8 BIL ?Manual Therapy: ?N/A ?Neuromuscular re-ed: ?N/A ?Therapeutic Activity: ?Re-assessment of objective information  with pt education about progress and POC ?Modalities: ?N/A ?Self Care: ?N/A ? ? ? ?  ?  ?PATIENT EDUCATION:  ?Education details: See therapeutic activity ?Person educated: Patient ?Education method: Explanation ?Education comprehension: verbalized understanding ?  ?  ?HOME EXERCISE PROGRAM: ?Access Code: [email or web address] ?URL: [email or web address] ?Date: 12/17/2021 ?Prepared by: Vanessa Holiday [place] ?  ?Exercises ?Heel Raises with Counter Support - 1 x daily - 7 x weekly - 3 sets - 15 reps ?Long Sitting Plantar Fascia Stretch with Towel - 1 x daily - 7 x weekly - 2 sets - 1-min hold ?Seated Plantar Fascia Stretch - 1 x daily - 7 x weekly - 2 sets - 1-min hold ?Gastroc Stretch on Wall - 1 x daily - 7 x weekly - 2 sets - 1-min hold ? ?Added 01/14/2022: ?Single Leg Heel Raise on Step - 1 x daily - 7 x weekly - 3 sets - 10 reps - 3-sec hold ?  ?  ?ASSESSMENT: ?  ?CLINICAL IMPRESSION: ?*** ? ?  ?REHAB POTENTIAL: Good ?  ?CLINICAL DECISION MAKING: Stable/uncomplicated ?  ?EVALUATION COMPLEXITY: Low ?  ?  ?GOALS: ?Goals reviewed with patient? No ?  ?SHORT TERM GOALS: ?  ?STG Name Target Date Goal status  ?1 Pt will report understanding and adherence to her HEP in order to promote independence in the management of her primary impairments. ?Baseline: HEP provided at eval ?01/14/2022: Pt reports daily adherence to her HEP 02/11/2022 ACHIEVED  ?  ?LONG TERM GOALS:  ?  ?LTG Name Target Date Goal status  ?1 Pt will demonstrate 10 SL heel raises BIL in order to reach into overhead cabinets at work with less limitation. ?Baseline: 3 on Rt, unable on Lt ?01/23/2022: Pt achieves 3x10 SL heel raises BIL 02/11/2022 ACHIEVED  ?2 Pt will achieve a FOTO score of 67% in order to demonstrate improved functional ability as it relates to her foot pain. ?Baseline: 48% ?01/16/2022: 67%  02/11/2022 ACHIEVED  ?3 Pt will achieve BIL ankle dorsiflexion of 10 degrees in order to promote WNL gait. ?Baseline: -6 on Rt, 2 on Lt ?01/14/2022: 3 on Rt, 2 on Lt ?02/14/2022: 5 BIL 02/11/2022 IN PROGRESS  ?4 Pt will

## 2022-03-06 ENCOUNTER — Ambulatory Visit: Payer: No Typology Code available for payment source

## 2022-03-06 NOTE — Therapy (Signed)
?OUTPATIENT PHYSICAL THERAPY TREATMENT NOTE ? ? ?Patient Name: Stacey Palmer ?MRN: [id number] ?DOB:10/12/1981, 41 y.o., female ?Today's Date: 03/07/2022 ? ?PCP: Pcp, No ?REFERRING PROVIDER: Criselda Peaches, DPM ? ? PT End of Session - 03/07/22 1039   ? ? Visit Number 11   ? Number of Visits 17   ? Date for PT Re-Evaluation 03/14/22   ? Authorization Type MCE   ? Progress Note Due on Visit 10   ? PT Start Time 1043   ? PT Stop Time 1125   ? PT Time Calculation (min) 42 min   ? Activity Tolerance Patient tolerated treatment well   ? Behavior During Therapy [facility] for tasks assessed/performed   ? ?  ?  ? ?  ? ? ? ? ? ? ? ? ? ? ? ?Past Medical History:  ?Diagnosis Date  ? Allergy   ? Anxiety   ? Depression, major, single episode, in partial remission ([place]) 12/26/2018  ? pHQ 9 SCORE OF 12 IN 12/2018, score of 0 in 03/2019 on medication  ? Dry eyes 12/26/2018  ? H/O gastric sleeve 2018  ? History of mammogram 2021  ? Obesity   ? Plantar fasciitis   ? Sleep apnea   ? ?Past Surgical History:  ?Procedure Laterality Date  ? BARIATRIC SURGERY  2018  ? CESAREAN SECTION    ? TUBAL LIGATION    ? ?Patient Active Problem List  ? Diagnosis Date Noted  ? Encounter for screening fecal occult blood testing 01/28/2022  ? Feeling sad 01/28/2022  ? Family planning 01/28/2022  ? Pregnancy examination or test, negative result 01/28/2022  ? Encounter for gynecological examination with Papanicolaou smear of cervix 01/28/2022  ? Abdominal cramps 12/16/2020  ? COVID-19 virus infection 11/30/2020  ? 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 Vaginal irritation 09/18/2020  ? Hip pain, right 09/16/2020  ? Snoring 09/16/2020  ? Labial skin tag 09/13/2020  ? Fatigue 12/06/2019  ? Allergies 12/06/2019  ? Hair loss 12/06/2019  ? GAD (generalized anxiety disorder) 12/26/2018  ? Gastroesophageal reflux disease 06/04/2017  ? Vitamin D deficiency 10/21/2016  ? 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 Obesity (BMI 30-39.9) 06/11/2015  ? Western blot positive HSV2  06/20/2014  ? Skin tag 06/05/2014  ? Allergic rhinitis 06/05/2014  ? URI (upper respiratory infection) 08/23/2012  ? Low back pain 04/04/2010  ? Morbid obesity ([place]) 02/10/2008  ? ? ?REFERRING DIAG: : M72.2 (ICD-10-CM) - Plantar fasciitis ? ?THERAPY DIAG:  ?Pain in left foot ? ?Muscle weakness (generalized) ? ?Difficulty in walking, not elsewhere classified ? ?Pain in right foot ? ?PERTINENT HISTORY: N/A ? ?PRECAUTIONS: None ? ?ONSET DATE: 01/2021 ? ?SUBJECTIVE: Pt reports that she followed up with her podiatrist, who has referred her to neurology for potential tarsal tunnel syndrome. She reports varied adherence to her HEP. She also reports continued 8/10 pain. ? ?PAIN:  ?Are you having pain? Yes ?NPRS scale: 8/10 ?Pain location: Lt medial ankle ?PAIN TYPE: burning ?Pain description: intermittent  ?Aggravating factors: prolonged standing >15 minutes, stair negotiation, prolonged standing >15 minutes ?Relieving factors: pain medication and rest ? ? ? ?OBJECTIVE:  ? *Unless otherwise noted, objective information collected previously* ? ?DIAGNOSTIC FINDINGS: 08/29/2022: MR Heel Left without Contrast: IMPRESSION: ?1. Mild tendinosis and tenosynovitis of the peroneus longus. ?2. Mild muscle edema in the abductor hallucis muscle and flexor ?digitorum brevis muscle concerning for muscle strain. ?  ?PATIENT SURVEYS:  ?FOTO 48%, predicted 67% in 14 visits ?01/16/2022: 67% (GOAL MET) ?  ?  COGNITION: ?         Overall cognitive status: Within functional limits for tasks assessed              ?          ?SENSATION: ?         Light touch: Appears intact ?          ?  ?MUSCLE LENGTH: ?Gastrocnemius: ?Soleus: ?  ?POSTURE:  ?Pt stands with BIL pes planus ?  ?PALPATION: ?TTP to BIL plantar heel and medial arch ? ?01/14/2022: Additionally TTP to BIL metatarsal heads ?  ?LE AROM/PROM: ?  ?A/PROM Right ?12/17/2021 Left ?12/17/2021 Right ?01/14/2022 Left ?01/14/2022 Right ?02/14/2022 Left ?02/14/2022  ?Ankle dorsiflexion -6/6 2/5 3/10 2/5 5/10  5/10  ?Ankle plantarflexion 50/50 45/45   45/55 45/50  ?Ankle inversion 25/45 35/50   25/45 40/60  ?Ankle eversion 25/45 10/35   10/34 10/35  ? (Blank rows = not tested) ?  ?LE MMT: ?  ?MMT Right ?12/17/2021 Left ?12/17/2021 Right ?01/02/2022 Left ?01/02/2022 Right ?02/04/2022 Left ?02/04/2022  ?Hip flexion 4/5 4/5 4/5 4/5    ?Hip extension 3/5 3/5      ?Hip abduction 3+/5 3+/5 4/5 4/5    ?Knee flexion 5/5 5/5      ?Knee extension 5/5 5/5      ?Ankle dorsiflexion 4/5 4/5   4+/5 4+/5  ?Ankle plantarflexion 5/5p! 5/5p!   5/5 5/5  ?Ankle inversion 4/5p! 4/5p!   4/5p! 4+/5p!  ?Ankle eversion 4/5p! 4/5p!   4/5p! 4+/5  ? (Blank rows = not tested) ?  ?LOWER EXTREMITY SPECIAL TESTS:  ?Windlass test: (+) BIL ?Step-down test: (+) p! On Lt ?Too many toes sign: (+) BIL ?Figure 8 swelling measure: Rt: 51cm, Lt: 52cm ?  ?FUNCTIONAL TESTS:  ?Squat: 75%,no pain ?5xSTS: 18 seconds ?SLS: Lt: 4 seconds before LOB, Rt: 2 seconds before LOB ?DL heel raise x25: x20 before stopping due to pain/ fatigue ?SL heel raise x10: Rt: x3 with poor form and pain, Lt: unable to perform due to weakness/ pain ?  ?  01/14/2022: SL heel raise x10: WNL BIL ? ?  02/14/2022: 5xSTS: 12 seconds   ? ?GAIT: ?Distance walked: 20 ft ?Assistive device utilized: None ?Level of assistance: Complete Independence ?Comments: Pt walks on lateral aspect of BIL feet, antalgic gait ?  ?  ?  ?TODAY'S TREATMENT: ? ?[place] [facility] Adult PT Treatment:                                                DATE: 03/07/2022 ?Therapeutic Exercise: ?Seated Lt foot peroneal nerve glides 2x20 ?Seated Rt foot tibial nerve glides 2x20 ?Seated inversion towel slide with 4# dumbbell on towel 2x3 BIL ?Seated alternating ankle dorsiflexion with double-ties YTB 4x1 min ?Standing heel raises on edge of Airex pad 2x20 ?Manual Therapy: ?N/A ?Neuromuscular re-ed: ?N/A ?Therapeutic Activity: ?Pt education regarding potential neurological pathophysiology behind her pain presentation and prognosis ?Updated and demonstrated HEP  including appropriate nerve glides for her pain presentation ?Modalities: ?N/A ?Self Care: ?N/A ? ? ?[place] Adult PT Treatment:                                                DATE: 02/18/2022 ?  Therapeutic Exercise: ?Bouncing heel raises on leg press machine with 40# 3x20 ?Alternating forward lunges with GTB Pallof press 2x10 BIL each side ?Tandem stance on Airex pad with 3kg ball swings 3x20 ?SLS with inside of foot off edge of Airex pad with quick contralateral knee drives 1H41 ?Manual Therapy: ?N/A ?Neuromuscular re-ed: ?N/A ?Therapeutic Activity: ?N/A ?Modalities: ?GameReady vasopneumatic treatment at 34d F to Lt foot x10 minutes ?Self Care: ?N/A ? ? ?[place] Adult PT Treatment:                                                DATE: 02/14/2022 ?Therapeutic Exercise: ?Seated inversion towel slides with 5# dumbbell on towel x3 full length of towel BIL ?Kickstand stance with primary stance foot on edge of Airex while performing lateral 10# kettlebell hand-offs 2x20 BIL ?Standing great toe extension stretch with forward lunge on edge of Airex pad x50mn BIL ?Bouncing heel raises with tennis ball beneath medial malleoli 3x30 ?SL dead lift with 10# kettlebell and UE support 2x8 BIL ?Manual Therapy: ?N/A ?Neuromuscular re-ed: ?N/A ?Therapeutic Activity: ?Re-assessment of objective information with pt education about progress and POC ?Modalities: ?N/A ?Self Care: ?N/A ? ? ? ?  ?  ?PATIENT EDUCATION:  ?Education details: See therapeutic activity ?Person educated: Patient ?Education method: Explanation ?Education comprehension: verbalized understanding ?  ?  ?HOME EXERCISE PROGRAM: ?Access Code: [email or web address] ?URL: [email or web address] ?Date: 12/17/2021 ?Prepared by: TVanessa [place]?  ?Exercises ?Heel Raises with Counter Support - 1 x daily - 7 x weekly - 3 sets - 15 reps ?Long Sitting Plantar Fascia Stretch with Towel - 1 x daily - 7 x weekly - 2 sets - 1-min hold ?Seated Plantar Fascia Stretch - 1 x daily - 7 x weekly  - 2 sets - 1-min hold ?Gastroc Stretch on Wall - 1 x daily - 7 x weekly - 2 sets - 1-min hold ? ?Added 01/14/2022: ?Single Leg Heel Raise on Step - 1 x daily - 7 x weekly - 3 sets - 10 reps - 3-sec hold

## 2022-03-07 ENCOUNTER — Ambulatory Visit: Payer: No Typology Code available for payment source | Attending: Podiatry

## 2022-03-07 DIAGNOSIS — M79671 Pain in right foot: Secondary | ICD-10-CM | POA: Diagnosis present

## 2022-03-07 DIAGNOSIS — M6281 Muscle weakness (generalized): Secondary | ICD-10-CM | POA: Insufficient documentation

## 2022-03-07 DIAGNOSIS — R262 Difficulty in walking, not elsewhere classified: Secondary | ICD-10-CM | POA: Insufficient documentation

## 2022-03-07 DIAGNOSIS — M79672 Pain in left foot: Secondary | ICD-10-CM | POA: Diagnosis present

## 2022-03-10 ENCOUNTER — Telehealth: Payer: Self-pay | Admitting: *Deleted

## 2022-03-10 NOTE — Telephone Encounter (Signed)
Patient is calling to request 2 more weeks of extended time off from work, still having foot pain, Neurology ([place])office cannot get her in until Sept. Please advise. ?

## 2022-03-11 NOTE — Telephone Encounter (Signed)
Called [facility], Dr Ethelene Hal, is booked out until Aug. But they have another doctor(Dr Chales Salmon) there that has some availability before that time. Can fax the referral there if ok

## 2022-03-11 NOTE — Telephone Encounter (Signed)
Faxed the referral to [facility], received confirmation, tried to contact patient but mailbox is full.

## 2022-03-16 ENCOUNTER — Encounter: Payer: No Typology Code available for payment source | Admitting: Family

## 2022-03-16 NOTE — Progress Notes (Signed)
Pt attempting to schedule her daughter an appointment. Will close this visit.  ? ?Evelina Dun, FNP ? ?

## 2022-03-18 NOTE — Therapy (Incomplete)
?OUTPATIENT PHYSICAL THERAPY TREATMENT NOTE ? ? ?Patient Name: Stacey Palmer ?MRN: [id number] ?DOB:09/08/1981, 41 y.o., female ?Today's Date: 03/18/2022 ? ?PCP: Pcp, No ?REFERRING PROVIDER: Criselda Peaches, DPM ? ? ? ? ? ? ? ? ? ? ? ? ? ?Past Medical History:  ?Diagnosis Date  ? Allergy   ? Anxiety   ? Depression, major, single episode, in partial remission ([place]) 12/26/2018  ? pHQ 9 SCORE OF 12 IN 12/2018, score of 0 in 03/2019 on medication  ? Dry eyes 12/26/2018  ? H/O gastric sleeve 2018  ? History of mammogram 2021  ? Obesity   ? Plantar fasciitis   ? Sleep apnea   ? ?Past Surgical History:  ?Procedure Laterality Date  ? BARIATRIC SURGERY  2018  ? CESAREAN SECTION    ? TUBAL LIGATION    ? ?Patient Active Problem List  ? Diagnosis Date Noted  ? Encounter for screening fecal occult blood testing 01/28/2022  ? Feeling sad 01/28/2022  ? Family planning 01/28/2022  ? Pregnancy examination or test, negative result 01/28/2022  ? Encounter for gynecological examination with Papanicolaou smear of cervix 01/28/2022  ? Abdominal cramps 12/16/2020  ? COVID-19 virus infection 11/30/2020  ? 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 Vaginal irritation 09/18/2020  ? Hip pain, right 09/16/2020  ? Snoring 09/16/2020  ? Labial skin tag 09/13/2020  ? Fatigue 12/06/2019  ? Allergies 12/06/2019  ? Hair loss 12/06/2019  ? GAD (generalized anxiety disorder) 12/26/2018  ? Gastroesophageal reflux disease 06/04/2017  ? Vitamin D deficiency 10/21/2016  ? 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 Obesity (BMI 30-39.9) 06/11/2015  ? 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 Skin tag 06/05/2014  ? Allergic rhinitis 06/05/2014  ? URI (upper respiratory infection) 08/23/2012  ? Low back pain 04/04/2010  ? Morbid obesity ([place]) 02/10/2008  ? ? ?REFERRING DIAG: : M72.2 (ICD-10-CM) - Plantar fasciitis ? ?THERAPY DIAG:  ?No diagnosis found. ? ?PERTINENT HISTORY: N/A ? ?PRECAUTIONS: None ? ?ONSET DATE: 01/2021 ? ?SUBJECTIVE: *** ? ?PAIN:  ?Are you having  pain? Yes ?NPRS scale: 8/10 ?Pain location: Lt medial ankle ?PAIN TYPE: burning ?Pain description: intermittent  ?Aggravating factors: prolonged standing >15 minutes, stair negotiation, prolonged standing >15 minutes ?Relieving factors: pain medication and rest ? ? ? ?OBJECTIVE:  ? *Unless otherwise noted, objective information collected previously* ? ?DIAGNOSTIC FINDINGS: 08/29/2022: MR Heel Left without Contrast: IMPRESSION: ?1. Mild tendinosis and tenosynovitis of the peroneus longus. ?2. Mild muscle edema in the abductor hallucis muscle and flexor ?digitorum brevis muscle concerning for muscle strain. ?  ?PATIENT SURVEYS:  ?FOTO 48%, predicted 67% in 14 visits ?01/16/2022: 67% (GOAL MET) ?  ?COGNITION: ?         Overall cognitive status: Within functional limits for tasks assessed              ?          ?SENSATION: ?         Light touch: Appears intact ?          ?  ?MUSCLE LENGTH: ?Gastrocnemius: ?Soleus: ?  ?POSTURE:  ?Pt stands with BIL pes planus ?  ?PALPATION: ?TTP to BIL plantar heel and medial arch ? ?01/14/2022: Additionally TTP to BIL metatarsal heads ?  ?LE AROM/PROM: ?  ?A/PROM Right ?12/17/2021 Left ?12/17/2021 Right ?01/14/2022 Left ?01/14/2022 Right ?02/14/2022 Left ?02/14/2022  ?Ankle dorsiflexion -6/6 2/5 3/10 2/5 5/10 5/10  ?Ankle plantarflexion 50/50 45/45   45/55 45/50  ?Ankle inversion 25/45 35/50  25/45 40/60  ?Ankle eversion 25/45 10/35   10/34 10/35  ? (Blank rows = not tested) ?  ?LE MMT: ?  ?MMT Right ?12/17/2021 Left ?12/17/2021 Right ?01/02/2022 Left ?01/02/2022 Right ?02/04/2022 Left ?02/04/2022  ?Hip flexion 4/5 4/5 4/5 4/5    ?Hip extension 3/5 3/5      ?Hip abduction 3+/5 3+/5 4/5 4/5    ?Knee flexion 5/5 5/5      ?Knee extension 5/5 5/5      ?Ankle dorsiflexion 4/5 4/5   4+/5 4+/5  ?Ankle plantarflexion 5/5p! 5/5p!   5/5 5/5  ?Ankle inversion 4/5p! 4/5p!   4/5p! 4+/5p!  ?Ankle eversion 4/5p! 4/5p!   4/5p! 4+/5  ? (Blank rows = not tested) ?  ?LOWER EXTREMITY SPECIAL TESTS:  ?Windlass test: (+)  BIL ?Step-down test: (+) p! On Lt ?Too many toes sign: (+) BIL ?Figure 8 swelling measure: Rt: 51cm, Lt: 52cm ?  ?FUNCTIONAL TESTS:  ?Squat: 75%,no pain ?5xSTS: 18 seconds ?SLS: Lt: 4 seconds before LOB, Rt: 2 seconds before LOB ?DL heel raise x25: x20 before stopping due to pain/ fatigue ?SL heel raise x10: Rt: x3 with poor form and pain, Lt: unable to perform due to weakness/ pain ?  ?  01/14/2022: SL heel raise x10: WNL BIL ? ?  02/14/2022: 5xSTS: 12 seconds   ? ?GAIT: ?Distance walked: 20 ft ?Assistive device utilized: None ?Level of assistance: Complete Independence ?Comments: Pt walks on lateral aspect of BIL feet, antalgic gait ?  ?  ?  ?TODAY'S TREATMENT: ? ?[facility] Adult PT Treatment:                                                DATE: 03/19/2022 ?Therapeutic Exercise: ?*** ?Manual Therapy: ?*** ?Neuromuscular re-ed: ?*** ?Therapeutic Activity: ?*** ?Modalities: ?*** ?Self Care: ?*** ? ? ?[facility] Adult PT Treatment:                                                DATE: 03/07/2022 ?Therapeutic Exercise: ?Seated Lt foot peroneal nerve glides 2x20 ?Seated Rt foot tibial nerve glides 2x20 ?Seated inversion towel slide with 4# dumbbell on towel 2x3 BIL ?Seated alternating ankle dorsiflexion with double-ties YTB 4x1 min ?Standing heel raises on edge of Airex pad 2x20 ?Manual Therapy: ?N/A ?Neuromuscular re-ed: ?N/A ?Therapeutic Activity: ?Pt education regarding potential neurological pathophysiology behind her pain presentation and prognosis ?Updated and demonstrated HEP including appropriate nerve glides for her pain presentation ?Modalities: ?N/A ?Self Care: ?N/A ? ? ?[facility] Adult PT Treatment:                                                DATE: 02/18/2022 ?Therapeutic Exercise: ?Bouncing heel raises on leg press machine with 40# 3x20 ?Alternating forward lunges with GTB Pallof press 2x10 BIL each side ?Tandem stance on Airex pad with 3kg ball swings 3x20 ?SLS with inside of foot off edge of Airex pad with quick  contralateral knee drives 1O10 ?Manual Therapy: ?N/A ?Neuromuscular re-ed: ?N/A ?Therapeutic Activity: ?N/A ?Modalities: ?GameReady vasopneumatic treatment at 34d F to Lt foot x10 minutes ?Self Care: ?N/A ? ? ? ?  ?  ?  PATIENT EDUCATION:  ?Education details: See therapeutic activity ?Person educated: Patient ?Education method: Explanation ?Education comprehension: verbalized understanding ?  ?  ?HOME EXERCISE PROGRAM: ?Access Code: [email or web address] ?URL: [email or web address] ?Date: 12/17/2021 ?Prepared by: Vanessa [place] ?  ?Exercises ?Heel Raises with Counter Support - 1 x daily - 7 x weekly - 3 sets - 15 reps ?Long Sitting Plantar Fascia Stretch with Towel - 1 x daily - 7 x weekly - 2 sets - 1-min hold ?Seated Plantar Fascia Stretch - 1 x daily - 7 x weekly - 2 sets - 1-min hold ?Gastroc Stretch on Wall - 1 x daily - 7 x weekly - 2 sets - 1-min hold ? ?Added 01/14/2022: ?Single Leg Heel Raise on Step - 1 x daily - 7 x weekly - 3 sets - 10 reps - 3-sec hold ? ?Added 03/07/2022: ?- CLX Ankle Dorsiflexion and Eversion  - 1 x daily - 7 x weekly - 3 sets - 30 reps ?- Long Sitting Tibial Nerve Flossing  - 1 x daily - 7 x weekly - 2 sets - 20 reps ?- Supine Peroneal Nerve Glide  - 1 x daily - 7 x weekly - 2 sets - 20 reps ?  ?  ?ASSESSMENT: ?  ?CLINICAL IMPRESSION: ?*** ? ?  ?REHAB POTENTIAL: Good ?  ?CLINICAL DECISION MAKING: Stable/uncomplicated ?  ?EVALUATION COMPLEXITY: Low ?  ?  ?GOALS: ?Goals reviewed with patient? No ?  ?SHORT TERM GOALS: ?  ?STG Name Target Date Goal status  ?1 Pt will report understanding and adherence to her HEP in order to promote independence in the management of her primary impairments. ?Baseline: HEP provided at eval ?01/14/2022: Pt reports daily adherence to her HEP 02/11/2022 ACHIEVED  ?  ?LONG TERM GOALS:  ?  ?LTG Name Target Date Goal status  ?1 Pt will demonstrate 10 SL heel raises BIL in order to reach into overhead cabinets at work with less limitation. ?Baseline: 3 on Rt, unable  on Lt ?01/23/2022: Pt achieves 3x10 SL heel raises BIL 02/11/2022 ACHIEVED  ?2 Pt will achieve a FOTO score of 67% in order to demonstrate improved functional ability as it relates to her foot pain. ?Baseline: 48% ?3/

## 2022-03-19 ENCOUNTER — Ambulatory Visit: Payer: No Typology Code available for payment source

## 2022-03-24 ENCOUNTER — Ambulatory Visit: Payer: No Typology Code available for payment source

## 2022-03-24 DIAGNOSIS — M79672 Pain in left foot: Secondary | ICD-10-CM

## 2022-03-24 DIAGNOSIS — R262 Difficulty in walking, not elsewhere classified: Secondary | ICD-10-CM

## 2022-03-24 DIAGNOSIS — M79671 Pain in right foot: Secondary | ICD-10-CM

## 2022-03-24 DIAGNOSIS — M6281 Muscle weakness (generalized): Secondary | ICD-10-CM

## 2022-03-24 NOTE — Therapy (Addendum)
OUTPATIENT PHYSICAL THERAPY TREATMENT NOTE/ RE-CERT/ DISCHARGE SUMMARY   Patient Name: Stacey Palmer MRN: [id number] DOB:02/02/1981, 41 y.o., female Today's Date: 03/24/2022  PCP: Merryl Hacker, No REFERRING PROVIDER: Criselda Peaches, DPM   PT End of Session - 03/24/22 1616     Visit Number 12    Number of Visits 17    Date for PT Re-Evaluation 03/14/22    Authorization Type MCE    Progress Note Due on Visit 10    PT Start Time 0430    PT Stop Time 0515    PT Time Calculation (min) 45 min    Activity Tolerance Patient tolerated treatment well    Behavior During Therapy WFL for tasks assessed/performed                       Past Medical History:  Diagnosis Date   Allergy    Anxiety    Depression, major, single episode, in partial remission ([place]) 12/26/2018   pHQ 9 SCORE OF 12 IN 12/2018, score of 0 in 03/2019 on medication   Dry eyes 12/26/2018   H/O gastric sleeve 2018   History of mammogram 2021   Obesity    Plantar fasciitis    Sleep apnea    Past Surgical History:  Procedure Laterality Date   BARIATRIC SURGERY  2018   CESAREAN SECTION     TUBAL LIGATION     Patient Active Problem List   Diagnosis Date Noted   Encounter for screening fecal occult blood testing 01/28/2022   Feeling sad 01/28/2022   Family planning 01/28/2022   Pregnancy examination or test, negative result 01/28/2022   Encounter for gynecological examination with Papanicolaou smear of cervix 01/28/2022   Abdominal cramps 12/16/2020   COVID-19 virus infection 11/30/2020   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) 02/10/2008    REFERRING DIAG: : M72.2 (ICD-10-CM) - Plantar fasciitis  THERAPY DIAG:  Pain in left foot  Muscle weakness (generalized)  Difficulty in walking, not elsewhere classified  Pain in right foot  PERTINENT HISTORY: N/A  PRECAUTIONS: None  ONSET DATE: 01/2021  SUBJECTIVE: Patient reports that she sees the neurologist June 1st.  PAIN:  Are you having pain? Yes NPRS scale: 8/10 Pain location: Lt medial ankle PAIN TYPE: burning Pain description: intermittent  Aggravating factors: prolonged standing >15 minutes, stair negotiation, prolonged standing >15 minutes Relieving factors: pain medication and rest    OBJECTIVE:   *Unless otherwise noted, objective information collected previously*  DIAGNOSTIC FINDINGS: 08/29/2022: MR Heel Left without Contrast: IMPRESSION: 1. Mild tendinosis and tenosynovitis of the peroneus longus. 2. Mild muscle edema in the abductor hallucis muscle and flexor digitorum brevis muscle concerning for muscle strain.   PATIENT SURVEYS:  FOTO 48%, predicted 67% in 14 visits 01/16/2022: 67% (GOAL MET)   COGNITION:          Overall cognitive status: Within functional limits for tasks assessed  SENSATION:          Light touch: Appears intact             MUSCLE LENGTH: Gastrocnemius: Soleus:   POSTURE:  Pt stands with BIL pes planus   PALPATION: TTP to BIL plantar heel and medial arch  01/14/2022: Additionally TTP to BIL metatarsal heads   LE AROM/PROM:   A/PROM Right 12/17/2021 Left 12/17/2021 Right 01/14/2022 Left 01/14/2022 Right 02/14/2022 Left 02/14/2022  Ankle dorsiflexion -6/6 2/5 3/10 2/5 5/10 5/10  Ankle plantarflexion 50/50 45/45   45/55 45/50  Ankle inversion 25/45 35/50   25/45 40/60  Ankle eversion 25/45  10/35   10/34 10/35   (Blank rows = not tested)   LE MMT:   MMT Right 12/17/2021 Left 12/17/2021 Right 01/02/2022 Left 01/02/2022 Right 02/04/2022 Left 02/04/2022 Right 03/24/2022  Hip flexion 4/5 4/5 4/5 4/5     Hip extension 3/5 3/5       Hip abduction 3+/5 3+/5 4/5 4/5     Knee flexion 5/5 5/5       Knee extension 5/5 5/5       Ankle dorsiflexion 4/5 4/5   4+/5 4+/5   Ankle plantarflexion 5/5p! 5/5p!   5/5 5/5   Ankle inversion 4/5p! 4/5p!   4/5p! 4+/5p! 4/5  Ankle eversion 4/5p! 4/5p!   4/5p! 4+/5 4/5   (Blank rows = not tested)   LOWER EXTREMITY SPECIAL TESTS:  Windlass test: (+) BIL Step-down test: (+) p! On Lt Too many toes sign: (+) BIL Figure 8 swelling measure: Rt: 51cm, Lt: 52cm   FUNCTIONAL TESTS:  Squat: 75%,no pain 5xSTS: 18 seconds SLS: Lt: 4 seconds before LOB, Rt: 2 seconds before LOB DL heel raise x25: x20 before stopping due to pain/ fatigue SL heel raise x10: Rt: x3 with poor form and pain, Lt: unable to perform due to weakness/ pain     01/14/2022: SL heel raise x10: WNL BIL    02/14/2022: 5xSTS: 12 seconds    GAIT: Distance walked: 20 ft Assistive device utilized: None Level of assistance: Complete Independence Comments: Pt walks on lateral aspect of BIL feet, antalgic gait       TODAY'S TREATMENT: [facility] Adult PT Treatment:                                                DATE: 03/24/2022 Therapeutic Activity: Discussion of goals met and partially met Updated HEP and patient verbalized understanding of each exercise Self Care: Plantar fasciitis self care: self massage with frozen water bottle, stretching of gastroc and plantar fascia Patient encouraged to not over do walking as this exacerbates her pain    [facility] Adult PT Treatment:                                                DATE: 03/07/2022 Therapeutic Exercise: Seated Lt foot peroneal nerve glides 2x20 Seated Rt foot tibial nerve glides 2x20 Seated inversion towel slide with 4# dumbbell on towel  2x3 BIL Seated alternating ankle dorsiflexion with double-ties YTB 4x1 min Standing heel raises on edge of Airex pad 2x20 Manual Therapy: N/A Neuromuscular re-ed: N/A Therapeutic Activity: Pt education regarding potential neurological pathophysiology behind her pain presentation and prognosis  Updated and demonstrated HEP including appropriate nerve glides for her pain presentation Modalities: N/A Self Care: N/A   [facility] Adult PT Treatment:                                                DATE: 02/18/2022 Therapeutic Exercise: Bouncing heel raises on leg press machine with 40# 3x20 Alternating forward lunges with GTB Pallof press 2x10 BIL each side Tandem stance on Airex pad with 3kg ball swings 3x20 SLS with inside of foot off edge of Airex pad with quick contralateral knee drives [id number] Manual Therapy: N/A Neuromuscular re-ed: N/A Therapeutic Activity: N/A Modalities: GameReady vasopneumatic treatment at 34d F to Lt foot x10 minutes Self Care: N/A     PATIENT EDUCATION:  Education details: See therapeutic activity Person educated: Patient Education method: Explanation Education comprehension: verbalized understanding     HOME EXERCISE PROGRAM: Access Code: [email or web address] URL: [email or web address] Date: 12/17/2021 Prepared by: Vanessa [place]   Exercises Heel Raises with Counter Support - 1 x daily - 7 x weekly - 3 sets - 15 reps Long Sitting Plantar Fascia Stretch with Towel - 1 x daily - 7 x weekly - 2 sets - 1-min hold Seated Plantar Fascia Stretch - 1 x daily - 7 x weekly - 2 sets - 1-min hold Gastroc Stretch on Wall - 1 x daily - 7 x weekly - 2 sets - 1-min hold  Added 01/14/2022: Single Leg Heel Raise on Step - 1 x daily - 7 x weekly - 3 sets - 10 reps - 3-sec hold  Added 03/07/2022: - CLX Ankle Dorsiflexion and Eversion  - 1 x daily - 7 x weekly - 3 sets - 30 reps - Long Sitting Tibial Nerve Flossing  - 1 x daily - 7 x weekly - 2 sets - 20 reps -  Supine Peroneal Nerve Glide  - 1 x daily - 7 x weekly - 2 sets - 20 reps     ASSESSMENT:   CLINICAL IMPRESSION: Patient presents to PT with continued high levels of pain in BIL feet, L>R. She reports HEP compliance. She states that she has an appointment to see a neurologist on 6/1/202. All LTG's have been met or partially met. She has made improvements in strength, ROM, and functional ability throughout PT. She still exhibits weakness with inversion and eversion as well as decreased dorsiflexion ROM on the L side. She is also still limited in the amount of time she can walk due to constant high level pain. Her pain has stayed at a high level since beginning therapy. She believes she has gotten benefit from PT, but that it hasn't had an effect on her pain. Patient is in agreement to DC from PT at this time until she sees neurologist. Patient advised to contact clinic with new referral if she would like to continue PT after.    REHAB POTENTIAL: Good   CLINICAL DECISION MAKING: Stable/uncomplicated   EVALUATION COMPLEXITY: Low     GOALS: Goals reviewed with patient? No   SHORT TERM GOALS:   STG Name Target Date Goal status  1 Pt will report understanding and adherence to her HEP in order to promote independence in the management of her primary impairments. Baseline: HEP provided at eval 01/14/2022: Pt reports daily adherence to her HEP 02/11/2022 ACHIEVED    LONG TERM GOALS:  LTG Name Target Date Goal status  1 Pt will demonstrate 10 SL heel raises BIL in order to reach into overhead cabinets at work with less limitation. Baseline: 3 on Rt, unable on Lt 01/23/2022: Pt achieves 3x10 SL heel raises BIL 02/11/2022 ACHIEVED  2 Pt will achieve a FOTO score of 67% in order to demonstrate improved functional ability as it relates to her foot pain. Baseline: 48% 01/16/2022: 67% 02/11/2022 ACHIEVED  3 Pt will achieve BIL ankle dorsiflexion of 10 degrees in order to promote WNL gait. Baseline: -6  on Rt, 2 on Lt 01/14/2022: 3 on Rt, 2 on Lt 02/14/2022: 5 BIL 03/24/2022: 5 BIL 02/11/2022 PARTIALLY MET  4 Pt will report ability to walk >1 hour with 0-3/10 pain in order to perform her work duties with less limitation. Baseline: >8/10 pain after 15 minutes of walking 02/14/2022: 8/10 pain after 15 minutes of walking 03/24/2022: 8/10 pain after 5 minutes of waling 02/11/2022 PARTIALLY MET  5 Pt will achieve BIL global LE strength of 4+/5 or greater in order to progress her independent strengthening program with less limitation. Baseline: See MMT chart 02/04/2022: See updated MMT chart 02/11/2022 PARTIALLY MET    PLAN: PT FREQUENCY: 2x/week   PT DURATION: 8 weeks   PLANNED INTERVENTIONS: Therapeutic exercises, Therapeutic activity, Neuro Muscular re-education, Balance training, Gait training, Patient/Family education, Joint mobilization, Stair training, Dry Needling, Cryotherapy, Moist heat, Taping, Vasopneumatic device, and Manual therapy   PLAN FOR NEXT SESSION: DC    Evelene Croon, PTA 03/24/22 4:45 PM  PHYSICAL THERAPY DISCHARGE SUMMARY  Visits from Start of Care: 12  Current functional level related to goals / functional outcomes: Pt has met or partially met all of her functional goals.   Remaining deficits: Continued limitation with functional standing/ walking, limited ankle ROM.   Education / Equipment: HEP   Patient agrees to discharge. Patient goals were partially met. Patient is being discharged due to being pleased with the current functional level.   Vanessa [place], PT, DPT 04/15/22 3:59 PM

## 2022-03-25 ENCOUNTER — Ambulatory Visit: Payer: No Typology Code available for payment source | Admitting: Adult Health

## 2022-04-02 ENCOUNTER — Encounter: Payer: Self-pay | Admitting: Adult Health

## 2022-04-02 ENCOUNTER — Ambulatory Visit (INDEPENDENT_AMBULATORY_CARE_PROVIDER_SITE_OTHER): Payer: No Typology Code available for payment source | Admitting: Adult Health

## 2022-04-02 VITALS — BP 125/83 | HR 65 | Ht 67.0 in | Wt 267.0 lb

## 2022-04-02 DIAGNOSIS — R4589 Other symptoms and signs involving emotional state: Secondary | ICD-10-CM | POA: Diagnosis not present

## 2022-04-02 DIAGNOSIS — F32A Depression, unspecified: Secondary | ICD-10-CM

## 2022-04-02 MED ORDER — SERTRALINE HCL 50 MG PO TABS: 50.0000 mg | ORAL_TABLET | Freq: Every day | ORAL | 3 refills | Status: DC

## 2022-04-02 NOTE — Progress Notes (Signed)
Subjective:     Patient ID: Stacey Palmer, female   DOB: [birth year], 41 y.o.   MRN: [id number]  HPI Shantese is a 41 year old black female,single, G3P3  back in follow up on starting zoloft 25 mg still sad and overwhelmed.  Lab Results  Component Value Date   DIAGPAP  01/28/2022    - Negative for intraepithelial lesion or malignancy (NILM)   HPVHIGH Negative 01/28/2022   PCP is Dr Lodema Hong  Review of Systems Feeling sad and over whelmed    Reviewed past medical,surgical, social and family history. Reviewed medications and allergies.  Objective:   Physical Exam BP 125/83 (BP Location: Right Arm, Patient Position: Sitting, Cuff Size: Normal)   Pulse 65   Ht 5\' 7"  (1.702 m)   Wt 267 lb (121.1 kg)   BMI 41.82 kg/m     Skin warm and dry. Lungs: clear to ausculation bilaterally. Cardiovascular: regular rate and rhythm.  She is using cane today.   Upstream - 04/02/22 1638       Pregnancy Intention Screening   Does the patient want to become pregnant in the next year? No    Does the patient's partner want to become pregnant in the next year? No    Would the patient like to discuss contraceptive options today? No      Contraception Wrap Up   Current Method Female Sterilization    End Method Female Sterilization    Contraception Counseling Provided No            Fall risk is low    04/02/2022    4:21 PM 01/28/2022    3:52 PM 12/19/2020    8:56 AM  Depression screen PHQ 2/9  Decreased Interest 0 0 0  Down, Depressed, Hopeless 2 1 0  PHQ - 2 Score 2 1 0  Altered sleeping 2 0   Tired, decreased energy 2 1   Change in appetite 0 0   Feeling bad or failure about yourself  2 0   Trouble concentrating 0 0   Moving slowly or fidgety/restless 0 0   Suicidal thoughts 0 0   PHQ-9 Score 8 2        04/02/2022    4:21 PM 01/28/2022    3:52 PM 12/18/2020    4:30 PM 10/01/2020    3:22 PM  GAD 7 : Generalized Anxiety Score  Nervous, Anxious, on Edge 0 0  0  Control/stop worrying 0 0   0  Worry too much - different things 2 0  0  Trouble relaxing 0 0  0  Restless 0 0  0  Easily annoyed or irritable 0 0  0  Afraid - awful might happen 0 0  0  Total GAD 7 Score 2 0  0     Information is confidential and restricted. Go to Review Flowsheets to unlock data.     Assessment:     1. Feeling sad On zoloft 25 mg will increase to 50 mg   2. Depression, unspecified depression type Feeling overwhelmed, is out of work on DB, has seen foot doctor and did PT, seeing neurologist tomorrow  Call [facility] Will increase Zoloft  Meds ordered this encounter  Medications   sertraline (ZOLOFT) 50 MG tablet    Sig: Take 1 tablet (50 mg total) by mouth daily.    Dispense:  30 tablet    Refill:  3    Order Specific Question:   Supervising Provider  Answer:   Duane Lope H [[id number]]   Follow up in 8 weeks or sooner if needed     Plan:    Call [facility]   Follow up in 8 weeks

## 2022-04-03 ENCOUNTER — Ambulatory Visit (INDEPENDENT_AMBULATORY_CARE_PROVIDER_SITE_OTHER): Payer: No Typology Code available for payment source | Admitting: Nurse Practitioner

## 2022-04-03 ENCOUNTER — Encounter (HOSPITAL_BASED_OUTPATIENT_CLINIC_OR_DEPARTMENT_OTHER): Payer: Self-pay | Admitting: Nurse Practitioner

## 2022-04-03 VITALS — BP 141/94 | HR 72 | Ht 67.0 in | Wt 264.0 lb

## 2022-04-03 DIAGNOSIS — Z0000 Encounter for general adult medical examination without abnormal findings: Secondary | ICD-10-CM | POA: Diagnosis not present

## 2022-04-03 DIAGNOSIS — F3341 Major depressive disorder, recurrent, in partial remission: Secondary | ICD-10-CM

## 2022-04-03 DIAGNOSIS — Z6841 Body Mass Index (BMI) 40.0 and over, adult: Secondary | ICD-10-CM

## 2022-04-03 DIAGNOSIS — H04123 Dry eye syndrome of bilateral lacrimal glands: Secondary | ICD-10-CM

## 2022-04-03 DIAGNOSIS — G629 Polyneuropathy, unspecified: Secondary | ICD-10-CM

## 2022-04-03 DIAGNOSIS — J3089 Other allergic rhinitis: Secondary | ICD-10-CM

## 2022-04-03 MED ORDER — GABAPENTIN 300 MG PO CAPS
300.0000 mg | ORAL_CAPSULE | Freq: Every day | ORAL | 3 refills | Status: DC
  Filled 2022-05-13: qty 90, 90d supply, fill #0
  Filled 2022-06-23: qty 30, 30d supply, fill #0

## 2022-04-03 MED ORDER — LEVOCETIRIZINE DIHYDROCHLORIDE 5 MG PO TABS
5.0000 mg | ORAL_TABLET | Freq: Every evening | ORAL | 0 refills | Status: DC
  Filled 2022-05-13: qty 90, 90d supply, fill #0
  Filled 2022-05-29: qty 30, 30d supply, fill #0

## 2022-04-03 MED ORDER — SERTRALINE HCL 50 MG PO TABS: 50.0000 mg | ORAL_TABLET | Freq: Every day | ORAL | 3 refills | Status: DC

## 2022-04-03 MED ORDER — IPRATROPIUM BROMIDE 0.03 % NA SOLN: 2.0000 | Freq: Two times a day (BID) | NASAL | 0 refills | Status: DC

## 2022-04-03 MED ORDER — AZELASTINE HCL 0.05 % OP SOLN: 1.0000 [drp] | Freq: Two times a day (BID) | OPHTHALMIC | 6 refills | Status: DC

## 2022-04-03 NOTE — Progress Notes (Signed)
Stacey Eth, DNP, AGNP-c Primary Care & Sports Medicine [address]  [address] [place], [place] [address] [phone]  New patient visit   Patient: Stacey Palmer   DOB: 1981/04/22   41 y.o. Female  MRN: [id number] Visit Date: 04/03/2022  Patient Care Team: Kerri Perches, MD as PCP - General (Family Medicine)  Today's Vitals   04/03/22 1113  BP: (!) 141/94  Pulse: 72  SpO2: 99%  Weight: 264 lb (119.7 kg)  Height: 5\' 7"  (1.702 m)   Body mass index is 41.35 kg/m.   Today's healthcare provider: , NP   Chief Complaint  Patient presents with  . New Patient (Initial Visit)    Patient presents to establish. Concerned about weight    Subjective    Stacey Palmer is a 41 y.o. female who presents today as a new patient to establish care.    Patient endorses the following concerns presently: Weight - up and down with weight for a long time - cravings and snack  Numbness in Feet - been going on for a long time - getting worse with time - has completed PT - some days feet are swelling -   History reviewed and reveals the following: Past Medical History:  Diagnosis Date  . Allergy   . Anxiety   . 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 Dry eyes 12/26/2018  . H/O gastric sleeve 2018  . History of mammogram 2021  . Obesity   . Plantar fasciitis   . Sleep apnea    Past Surgical History:  Procedure Laterality Date  . BARIATRIC SURGERY  2018  . CESAREAN SECTION    . TUBAL LIGATION     Family Status  Relation Name Status  . Mother  Alive       sleep apnea  . Father  Deceased  . Sister  Alive       thyroid disease  . Brother  Alive  . 2019  Deceased  . PGF  Deceased  . PGM  Deceased  . MGM  Deceased  . MGF  Deceased  . Daughter  Alive  . Son  Alive  . Son  Alive   Family History  Problem Relation Age of  Onset  . Hypertension Mother   . Kidney disease Father   . Diabetes Father   . Breast cancer Paternal Aunt   . Dementia Paternal Grandmother   . Asthma Daughter    Social History   Socioeconomic History  . Marital status: Single    Spouse name: Not on file  . Number of children: Not on file  . Years of education: Not on file  . Highest education level: Not on file  Occupational History  . Not on file  Tobacco Use  . Smoking status: Never  . Smokeless tobacco: Never  Vaping Use  . Vaping Use: Never used  Substance and Sexual Activity  . Alcohol use: No  . Drug use: No  . Sexual activity: Not Currently    Birth control/protection: Surgical    Comment: tubal  Other Topics Concern  . Not on file  Social History Narrative   Tobacco use, amount per day now:   Past tobacco use, amount per day:   How many years did you use tobacco:   Alcohol use (drinks per week): Once in  a while.   Diet:   Do you drink/eat things with caffeine:   Marital status:  Single.                                What year were you married?   Do you live in a house, apartment, assisted living, condo, trailer, etc.?   Is it one or more stories?   How many persons live in your home?   Do you have pets in your home?( please list) Yes Cats   Highest Level of education completed?   Current or past profession:   Do you exercise? Yes                                 Type and how often?   Do you have a living will? No.   Do you have a DNR form? No                                  If not, do you want to discuss one?   Do you have signed POA/HPOA forms? No.                       If so, please bring to you appointment      Do you have any difficulty bathing or dressing yourself? No.   Do you have any difficulty preparing food or eating? No.   Do you have any difficulty managing your medications? No.   Do you have any difficulty managing your finances? No.   Do you have any difficulty affording your  medications? No.   Social Determinants of Health   Financial Resource Strain: Medium Risk  . Difficulty of Paying Living Expenses: Somewhat hard  Food Insecurity: No Food Insecurity  . Worried About Programme researcher, broadcasting/film/videounning Out of Food in the Last Year: Never true  . Ran Out of Food in the Last Year: Never true  Transportation Needs: No Transportation Needs  . Lack of Transportation (Medical): No  . Lack of Transportation (Non-Medical): No  Physical Activity: Insufficiently Active  . Days of Exercise per Week: 3 days  . Minutes of Exercise per Session: 30 min  Stress: No Stress Concern Present  . Feeling of Stress : Only a little  Social Connections: Moderately Isolated  . Frequency of Communication with Friends and Family: More than three times a week  . Frequency of Social Gatherings with Friends and Family: Twice a week  . Attends Religious Services: 1 to 4 times per year  . Active Member of Clubs or Organizations: No  . Attends BankerClub or Organization Meetings: Never  . Marital Status: Never married   Outpatient Medications Prior to Visit  Medication Sig  . Cholecalciferol (VITAMIN D3 PO) Take 1 capsule by mouth once a week.  . fluticasone (FLONASE) 50 MCG/ACT nasal spray Place 2 sprays into both nostrils daily.  Marland Kitchen. gabapentin (NEURONTIN) 300 MG capsule Take 1 capsule (300 mg total) by mouth at bedtime.  Marland Kitchen. ipratropium (ATROVENT) 0.03 % nasal spray Place 2 sprays into both nostrils 2 (two) times daily.  Marland Kitchen. levocetirizine (XYZAL) 5 MG tablet Take 1 tablet (5 mg total) by mouth every evening.  . sertraline (ZOLOFT) 50 MG tablet Take 1 tablet (50 mg total) by mouth daily.  .Marland Kitchen  valACYclovir (VALTREX) 500 MG tablet Take 500 mg by mouth.  . Vitamin D, Ergocalciferol, (DRISDOL) 1.25 MG (50000 UNIT) CAPS capsule TAKE 1 CAPSULE BY MOUTH ONCE A WEEK AS DIRECTED  . Ferrous Sulfate (IRON PO) Take 1 capsule by mouth daily. (Patient not taking: Reported on 04/02/2022)  . ibuprofen (ADVIL) 800 MG tablet Take 1 tablet  (800 mg total) by mouth every 8 (eight) hours as needed. (Patient not taking: Reported on 04/03/2022)  . meloxicam (MOBIC) 15 MG tablet TAKE 1 TABLET(15 MG) BY MOUTH DAILY (Patient not taking: Reported on 04/02/2022)  . prednisoLONE acetate (PRED FORTE) 1 % ophthalmic suspension SMARTSIG:In Eye(s) (Patient not taking: Reported on 04/03/2022)  . promethazine-dextromethorphan (PROMETHAZINE-DM) 6.25-15 MG/5ML syrup Take 5 mLs by mouth 4 (four) times daily as needed for cough. (Patient not taking: Reported on 04/02/2022)  . pseudoephedrine (SUDAFED) 60 MG tablet Take 1 tablet (60 mg total) by mouth every 8 (eight) hours as needed for congestion. (Patient not taking: Reported on 04/02/2022)   No facility-administered medications prior to visit.   No Known Allergies Immunization History  Administered Date(s) Administered  . Influenza Split 10/06/2011, 09/01/2012, 07/23/2014  . Influenza Whole 08/17/2007, 09/13/2009  . Influenza,inj,Quad PF,6+ Mos 08/24/2013, 08/06/2016, 07/22/2017, 08/19/2018, 08/08/2019, 07/27/2020  . Moderna Sars-Covid-2 Vaccination 12/09/2019, 01/06/2020, 08/27/2020  . PPD Test 06/02/2014  . Td 03/05/2005  . Tdap 09/20/2010, 10/06/2011    Review of Systems All review of systems negative except what is listed in the HPI   Objective    BP (!) 141/94   Pulse 72   Ht 5\' 7"  (1.702 m)   Wt 264 lb (119.7 kg)   SpO2 99%   BMI 41.35 kg/m  Physical Exam  No results found for any visits on 04/03/22.  Assessment & Plan      Problem List Items Addressed This Visit   None Visit Diagnoses     Healthcare maintenance    -  Primary   Relevant Orders   CBC With Diff/Platelet   Comprehensive metabolic panel   Hemoglobin A1c   VITAMIN D 25 Hydroxy (Vit-D Deficiency, Fractures)   Lipid panel   Thyroid Panel With TSH        No follow-ups on file.      , 06/03/22, NP, DNP, AGNP-C Primary Care & Sports Medicine at [facility]

## 2022-04-03 NOTE — Patient Instructions (Signed)
Thank you for choosing [place] at [facility] for your Primary Care needs. I am excited for the opportunity to partner with you to meet your health care goals. It was a pleasure meeting you today!  Recommendations from today's visit: Let me know about the nerve conduction study- you can ask them to send the results to [place] and I will review them Work on diet with [place]- I want to get all of the labs before we make any decisions on next steps Work on walking 10 minutes a day I will be in touch with you when we get the labs back and we can go from there.   Information on diet, exercise, and health maintenance recommendations are listed below. This is information to help you be sure you are on track for optimal health and monitoring.   Please look over this and let us know if you have any questions or if you have completed any of the health maintenance outside of [place] so that we can be sure your records are up to date.  ___________________________________________________________ About Me: I am an Adult-Geriatric Nurse Practitioner with a background in caring for patients for more than 20 years with a strong intensive care background. I provide primary care and sports medicine services to patients age 68 and older within this office. My education had a strong focus on caring for the older adult population, which I am passionate about. I am also the director of the APP Fellowship with [employer].   My desire is to provide you with the best service through preventive medicine and supportive care. I consider you a part of the medical team and value your input. I work diligently to ensure that you are heard and your needs are met in a safe and effective manner. I want you to feel comfortable with me as your provider and want you to know that your health concerns are important to me.  For your information, our office hours are: Monday, Tuesday, and Thursday 8:00 AM - 5:00  PM Wednesday and Friday 8:00 AM - 12:00 PM.   In my time away from the office I am teaching new APP's within the system and am unavailable, but my partner, Dr. Burnard Bunting is in the office for emergent needs.   If you have questions or concerns, please call our office at [phone] or send [place] a MyChart message and we will respond as quickly as possible.  ____________________________________________________________ MyChart:  For all urgent or time sensitive needs we ask that you please call the office to avoid delays. Our number is [phone]. MyChart is not constantly monitored and due to the large volume of messages a day, replies may take up to 72 business hours.  MyChart Policy: MyChart allows for you to see your visit notes, after visit summary, provider recommendations, lab and tests results, make an appointment, request refills, and contact your provider or the office for non-urgent questions or concerns. Providers are seeing patients during normal business hours and do not have built in time to review MyChart messages.  We ask that you allow a minimum of 3 business days for responses to [employer]. For this reason, please do not send urgent requests through [place]. Please call the office at [phone]. New and ongoing conditions may require a visit. We have virtual and in person visit available for your convenience.  Complex MyChart concerns may require a visit. Your provider may request you schedule a virtual or in person visit  to ensure we are providing the best care possible. MyChart messages sent after 11:00 AM on Friday will not be received by the provider until Monday morning.    Lab and Test Results: You will receive your lab and test results on MyChart as soon as they are completed and results have been sent by the lab or testing facility. Due to this service, you will receive your results BEFORE your provider.  I review lab and tests results each morning prior to seeing  patients. Some results require collaboration with other providers to ensure you are receiving the most appropriate care. For this reason, we ask that you please allow a minimum of 3-5 business days from the time the ALL results have been received for your provider to receive and review lab and test results and contact you about these.  Most lab and test result comments from the provider will be sent through [place]. Your provider may recommend changes to the plan of care, follow-up visits, repeat testing, ask questions, or request an office visit to discuss these results. You may reply directly to this message or call the office at [phone] to provide information for the provider or set up an appointment. In some instances, you will be called with test results and recommendations. Please let us know if this is preferred and we will make note of this in your chart to provide this for you.    If you have not heard a response to your lab or test results in 5 business days from all results returning to [place], please call the office to let us know. We ask that you please avoid calling prior to this time unless there is an emergent concern. Due to high call volumes, this can delay the resulting process.  After Hours: For all non-emergency after hours needs, please call the office at [phone] and select the option to reach the on-call provider service. On-call services are shared between multiple [place] offices and therefore it will not be possible to speak directly with your provider. On-call providers may provide medical advice and recommendations, but are unable to provide refills for maintenance medications.  For all emergency or urgent medical needs after normal business hours, we recommend that you seek care at the closest Urgent Care or Emergency Department to ensure appropriate treatment in a timely manner.  [facility] [place] has a 24 hour emergency room located on the ground  floor for your convenience.   Urgent Concerns During the Business Day Providers are seeing patients from 8AM to [place] with a busy schedule and are most often not able to respond to non-urgent calls until the end of the day or the next business day. If you should have URGENT concerns during the day, please call and speak to the nurse or schedule a same day appointment so that we can address your concern without delay.   Thank you, again, for choosing me as your health care partner. I appreciate your trust and look forward to learning more about you.   [name], DNP, AGNP-c ___________________________________________________________  Health Maintenance Recommendations Screening Testing Mammogram Every 1 -2 years based on history and risk factors Starting at age 48 Pap Smear Ages 21-39 every 3 years Ages 72-65 every 5 years with HPV testing More frequent testing may be required based on results and history Colon Cancer Screening Every 1-10 years based on test performed, risk factors, and history Starting at age 44 Bone Density Screening Every 2-10 years based on  history Starting at age 77 for women Recommendations for men differ based on medication usage, history, and risk factors AAA Screening One time ultrasound Men 32-39 years old who have every smoked Lung Cancer Screening Low Dose Lung CT every 12 months Age 57-80 years with a 30 pack-year smoking history who still smoke or who have quit within the last 15 years  Screening Labs Routine  Labs: Complete Blood Count (CBC), Complete Metabolic Panel (CMP), Cholesterol (Lipid Panel) Every 6-12 months based on history and medications May be recommended more frequently based on current conditions or previous results Hemoglobin A1c Lab Every 3-12 months based on history and previous results Starting at age 53 or earlier with diagnosis of diabetes, high cholesterol, BMI >26, and/or risk factors Frequent monitoring for patients  with diabetes to ensure blood sugar control Thyroid Panel (TSH w/ T3 & T4) Every 6 months based on history, symptoms, and risk factors May be repeated more often if on medication HIV One time testing for all patients 23 and older May be repeated more frequently for patients with increased risk factors or exposure Hepatitis C One time testing for all patients 39 and older May be repeated more frequently for patients with increased risk factors or exposure Gonorrhea, Chlamydia Every 12 months for all sexually active persons 13-24 years Additional monitoring may be recommended for those who are considered high risk or who have symptoms PSA Men 63-22 years old with risk factors Additional screening may be recommended from age 18-69 based on risk factors, symptoms, and history  Vaccine Recommendations Tetanus Booster All adults every 10 years Flu Vaccine All patients 6 months and older every year COVID Vaccine All patients 12 years and older Initial dosing with booster May recommend additional booster based on age and health history HPV Vaccine 2 doses all patients age 71-26 Dosing may be considered for patients over 26 Shingles Vaccine (Shingrix) 2 doses all adults 13 years and older Pneumonia (Pneumovax 23) All adults 43 years and older May recommend earlier dosing based on health history Pneumonia (Prevnar 50) All adults 73 years and older Dosed 1 year after Pneumovax 23  Additional Screening, Testing, and Vaccinations may be recommended on an individualized basis based on family history, health history, risk factors, and/or exposure.  __________________________________________________________  Diet Recommendations for All Patients  I recommend that all patients maintain a diet low in saturated fats, carbohydrates, and cholesterol. While this can be challenging at first, it is not impossible and small changes can make big differences.  Things to try: Decreasing the amount of  soda, sweet tea, and/or juice to one or less per day and replace with water While water is always the first choice, if you do not like water you may consider adding a water additive without sugar to improve the taste other sugar free drinks Replace potatoes with a brightly colored vegetable at dinner Use healthy oils, such as canola oil or olive oil, instead of butter or hard margarine Limit your bread intake to two pieces or less a day Replace regular pasta with low carb pasta options Bake, broil, or grill foods instead of frying Monitor portion sizes  Eat smaller, more frequent meals throughout the day instead of large meals  An important thing to remember is, if you love foods that are not great for your health, you don't have to give them up completely. Instead, allow these foods to be a reward when you have done well. Allowing yourself to still have special treats every once  in a while is a nice way to tell yourself thank you for working hard to keep yourself healthy.   Also remember that every day is a new day. If you have a bad day and "fall off the wagon", you can still climb right back up and keep moving along on your journey!  We have resources available to help you!  Some websites that may be helpful include: www.http://carter.biz/  Www.VeryWellFit.com _____________________________________________________________  Activity Recommendations for All Patients  I recommend that all adults get at least 20 minutes of moderate physical activity that elevates your heart rate at least 5 days out of the week.  Some examples include: Walking or jogging at a pace that allows you to carry on a conversation Cycling (stationary bike or outdoors) Water aerobics Yoga Weight lifting Dancing If physical limitations prevent you from putting stress on your joints, exercise in a pool or seated in a chair are excellent options.  Do determine your MAXIMUM heart rate for activity: YOUR AGE - 220 = MAX  HeartRate   Remember! Do not push yourself too hard.  Start slowly and build up your pace, speed, weight, time in exercise, etc.  Allow your body to rest between exercise and get good sleep. You will need more water than normal when you are exerting yourself. Do not wait until you are thirsty to drink. Drink with a purpose of getting in at least 8, 8 ounce glasses of water a day plus more depending on how much you exercise and sweat.    If you begin to develop dizziness, chest pain, abdominal pain, jaw pain, shortness of breath, headache, vision changes, lightheadedness, or other concerning symptoms, stop the activity and allow your body to rest. If your symptoms are severe, seek emergency evaluation immediately. If your symptoms are concerning, but not severe, please let us know so that we can recommend further evaluation.

## 2022-04-04 ENCOUNTER — Encounter (HOSPITAL_BASED_OUTPATIENT_CLINIC_OR_DEPARTMENT_OTHER): Payer: Self-pay | Admitting: Nurse Practitioner

## 2022-04-04 LAB — COMPREHENSIVE METABOLIC PANEL
ALT: 10 IU/L (ref 0–32)
AST: 9 IU/L (ref 0–40)
Albumin/Globulin Ratio: 1.3 (ref 1.2–2.2)
Albumin: 4.1 g/dL (ref 3.8–4.8)
Alkaline Phosphatase: 64 IU/L (ref 44–121)
BUN/Creatinine Ratio: 12 (ref 9–23)
BUN: 9 mg/dL (ref 6–24)
Bilirubin Total: 0.4 mg/dL (ref 0.0–1.2)
CO2: 25 mmol/L (ref 20–29)
Calcium: 9.4 mg/dL (ref 8.7–10.2)
Chloride: 101 mmol/L (ref 96–106)
Creatinine, Ser: 0.76 mg/dL (ref 0.57–1.00)
Globulin, Total: 3.2 g/dL (ref 1.5–4.5)
Glucose: 77 mg/dL (ref 70–99)
Potassium: 4 mmol/L (ref 3.5–5.2)
Sodium: 140 mmol/L (ref 134–144)
Total Protein: 7.3 g/dL (ref 6.0–8.5)
eGFR: 100 mL/min/{1.73_m2} (ref >59)

## 2022-04-04 LAB — CBC WITH DIFF/PLATELET
Basophils Absolute: 0 10*3/uL (ref 0.0–0.2)
Basos: 0 %
EOS (ABSOLUTE): 0.1 10*3/uL (ref 0.0–0.4)
Eos: 2 %
Hematocrit: 39.3 % (ref 34.0–46.6)
Hemoglobin: 13.3 g/dL (ref 11.1–15.9)
Immature Grans (Abs): 0 10*3/uL (ref 0.0–0.1)
Immature Granulocytes: 0 %
Lymphocytes Absolute: 1.9 10*3/uL (ref 0.7–3.1)
Lymphs: 45 %
MCH: 31.1 pg (ref 26.6–33.0)
MCHC: 33.8 g/dL (ref 31.5–35.7)
MCV: 92 fL (ref 79–97)
Monocytes Absolute: 0.4 10*3/uL (ref 0.1–0.9)
Monocytes: 8 %
Neutrophils Absolute: 1.9 10*3/uL (ref 1.4–7.0)
Neutrophils: 45 %
Platelets: 321 10*3/uL (ref 150–450)
RBC: 4.28 x10E6/uL (ref 3.77–5.28)
RDW: 11.8 % (ref 11.7–15.4)
WBC: 4.3 10*3/uL (ref 3.4–10.8)

## 2022-04-04 LAB — THYROID PANEL WITH TSH
Free Thyroxine Index: 2.1 (ref 1.2–4.9)
T3 Uptake Ratio: 26 % (ref 24–39)
T4, Total: 7.9 ug/dL (ref 4.5–12.0)
TSH: 1.45 u[IU]/mL (ref 0.450–4.500)

## 2022-04-04 LAB — VITAMIN D 25 HYDROXY (VIT D DEFICIENCY, FRACTURES): Vit D, 25-Hydroxy: 26.8 ng/mL — ABNORMAL LOW (ref 30.0–100.0)

## 2022-04-04 LAB — LIPID PANEL
Chol/HDL Ratio: 3.3 ratio (ref 0.0–4.4)
Cholesterol, Total: 212 mg/dL — ABNORMAL HIGH (ref 100–199)
HDL: 65 mg/dL (ref >39)
LDL Chol Calc (NIH): 131 mg/dL — ABNORMAL HIGH (ref 0–99)
Triglycerides: 92 mg/dL (ref 0–149)
VLDL Cholesterol Cal: 16 mg/dL (ref 5–40)

## 2022-04-04 LAB — HEMOGLOBIN A1C
Est. average glucose Bld gHb Est-mCnc: 100 mg/dL
Hgb A1c MFr Bld: 5.1 % (ref 4.8–5.6)

## 2022-04-04 NOTE — Assessment & Plan Note (Signed)
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 A1c today within normal range.  She does have notable abnormal gait and decreased sensation.  Encourage patient to proceed with the nerve conduction study.  If this is unrevealing recommend neurology evaluation.  We will obtain labs today to check for vitamin deficiency that could be contributing as well as thyroid disorder. We will make changes to plan of care based on lab findings.

## 2022-04-04 NOTE — Assessment & Plan Note (Signed)
Chronic.  Currently managed with sertraline.  No alarm symptoms present today.  Continue to monitor closely.

## 2022-04-04 NOTE — Assessment & Plan Note (Signed)
Lengthy discussion with patient today about healthy ways to help manage her weight. Recommend the following: Monitoring carbohydrate intake with goal of 150 to 180 g of carbohydrates divided throughout the day in 3 meals and 2 snacks. At least 150 minutes of regular exercise per week.  Start with walking 10 minutes a day in each week slowly increase distance and time.  Walk at a pace where you can talk with the person next to you however your heart rate is elevated Reduce saturated fats in the diet to no more than 13 g a day.  Increase healthy fats which are often found in whole-grains, nuts, seeds, berries. Prepackage treats and snack foods and Ziploc bags with appropriate size for serving to allow easy access and limit urge of eating more than 1 serving. Keep healthy snack options around such as fruit and vegetables Ensure that you have an appropriate amount of protein with every meal Handouts provided to help guide carbohydrate and protein intake We will obtain labs today for evaluation and make recommendations based on findings.

## 2022-04-04 NOTE — Assessment & Plan Note (Signed)
Chronic.  Well-controlled with over-the-counter medications.  Refills provided today.

## 2022-04-04 NOTE — Assessment & Plan Note (Signed)
Chronic.  Etiology unknown.  Recommend rewetting eyedrops.  May benefit from ophthalmology exam for further evaluation and recommendations.

## 2022-04-04 NOTE — Assessment & Plan Note (Signed)
>>  ASSESSMENT AND PLAN FOR DEPRESSION WRITTEN ON 04/04/2022  6:12 PM BY ,  E, NP  Chronic.  Currently managed with sertraline.  No alarm symptoms present today.  Continue to monitor closely.

## 2022-04-09 ENCOUNTER — Ambulatory Visit (INDEPENDENT_AMBULATORY_CARE_PROVIDER_SITE_OTHER): Payer: No Typology Code available for payment source | Admitting: Podiatry

## 2022-04-09 ENCOUNTER — Encounter: Payer: Self-pay | Admitting: Podiatry

## 2022-04-09 DIAGNOSIS — G609 Hereditary and idiopathic neuropathy, unspecified: Secondary | ICD-10-CM

## 2022-04-09 NOTE — Patient Instructions (Signed)
Call  [phone] to schedule your neurology referral  Take the gabapentin every night. In 2-3 weeks you can increase to twice daily

## 2022-04-09 NOTE — Progress Notes (Signed)
Subjective:  Patient ID: Stacey Palmer, female    DOB: 08/10/1981,  MRN: [id number]  Chief Complaint  Patient presents with   Foot Pain    "They're not any better.  It's not Plantar Fasciitis."    41 y.o. female returns with the above complaint. History confirmed with patient.   She completed the nerve testing.  I do not have the results with me today but she says the doctor discussed with her that she likely has neuropathy and nerve damage nerve pain possibly tarsal tunnel syndrome.  Objective:  Physical Exam: warm, good capillary refill, no trophic changes or ulcerative lesions, normal DP and PT pulses and normal sensory exam.  Today she has pain on palpation and percussion to the tibial nerve in the tarsal tunnel as well as compression of the lateral plantar nerve at the heel left is worse than right  Radiographs: X-ray of both feet: no fracture, dislocation, swelling or degenerative changes noted, hallux valgus deformity and pes planus  Study Result  Narrative & Impression  CLINICAL DATA:  Heel pain and burning since March 2022.   EXAM: MR OF THE LEFT HEEL WITHOUT CONTRAST   TECHNIQUE: Multiplanar, multisequence MR imaging of the right heel was performed. No intravenous contrast was administered.   COMPARISON:  None.   FINDINGS: TENDONS   Peroneal: Mild tendinosis and tenosynovitis of the peroneus longus. Peroneal brevis intact.   Posteromedial: Posterior tibial tendon intact. Flexor hallucis longus tendon intact. Flexor digitorum longus tendon intact.   Anterior: Tibialis anterior tendon intact. Extensor hallucis longus tendon intact Extensor digitorum longus tendon intact.   Achilles:  Intact.   Plantar Fascia: Plantar fascia is intact. Mild muscle edema in the abductor hallucis muscle and flexor digitorum brevis muscle concerning for muscle strain.   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 Calcaneofibular ligament  intact. Posterior talofibular ligament intact. Anterior and posterior tibiofibular ligaments intact.   Medial: Chronic partial tear of the deltoid ligament. Spring ligament intact.   CARTILAGE   Ankle Joint: No joint effusion. Partial-thickness cartilage loss of the talofibular joint.   Subtalar Joints/Sinus Tarsi: Normal subtalar joints. No subtalar joint effusion. Normal sinus tarsi.   Bones: No acute fracture or dislocation. Relative pes planus. Small plantar calcaneal spur. Subcortical reactive marrow changes in the medial malleolus adjacent to the deltoid ligament insertion through   Soft Tissue: No fluid collection or hematoma. Mild muscle edema in the abductor hallucis muscle and flexor digitorum brevis muscle concerning for muscle strain. Tarsal tunnel is normal.   IMPRESSION: 1. Mild tendinosis and tenosynovitis of the peroneus longus. 2. Mild muscle edema in the abductor hallucis muscle and flexor digitorum brevis muscle concerning for muscle strain.     Electronically Signed   By: Elige Ko M.D.   On: 09/02/2021 07:17   Assessment:   1. Idiopathic neuropathy      Plan:  Patient was evaluated and treated and all questions answered.  We will have my office obtain the report for EMG/NCV but does sound like this is likely neuropathy or tarsal tunnel syndrome as opposed to planter fasciitis.  I discussed with her the etiology and treatment options for neuropathy in general.  She is already started the gabapentin 300 mg and she is taking some nights.  I recommended she begin taking this every night.  Can add a second dose in the morning in a few weeks if she tolerates this.  With possible tarsal tunnel syndrome we may  need to repeat her ankle MRIs now on both sides to evaluate for any further possible impinging lesions or mass effect but I would like to see her EMG and NCV prior to this.  I will refer her to neurology for chronic management of her neuropathy to see if  there are other options or etiologies.  I also ordered B12 and folate and will follow-up with her regarding possible supplementation if we need to  No follow-ups on file.

## 2022-04-10 LAB — B12 AND FOLATE PANEL
Folate: 14.5 ng/mL (ref >3.0)
Vitamin B-12: 556 pg/mL (ref 232–1245)

## 2022-04-11 ENCOUNTER — Telehealth: Payer: Self-pay | Admitting: Podiatry

## 2022-04-11 ENCOUNTER — Telehealth: Payer: Self-pay | Admitting: *Deleted

## 2022-04-11 NOTE — Telephone Encounter (Signed)
Patient is scheduled to return to work on 05/03/2022, she said you wanted to continue her oow. How long did you want to extend leave?

## 2022-04-11 NOTE — Telephone Encounter (Signed)
I left a message for the Medical Records department at [facility] requesting the nerve test results.  I asked them to fax the results to [phone].

## 2022-04-15 NOTE — Addendum Note (Signed)
Addended by: Sigmund Hazel on: 04/15/2022 04:02 PM   Modules accepted: Orders

## 2022-04-16 NOTE — Telephone Encounter (Signed)
Nerve test results were received.

## 2022-04-17 ENCOUNTER — Telehealth (HOSPITAL_BASED_OUTPATIENT_CLINIC_OR_DEPARTMENT_OTHER): Payer: Self-pay | Admitting: Nurse Practitioner

## 2022-04-26 LAB — SPECIMEN STATUS REPORT

## 2022-04-26 LAB — B12 AND FOLATE PANEL
Folate: 16.4 ng/mL (ref >3.0)
Vitamin B-12: 537 pg/mL (ref 232–1245)

## 2022-04-28 ENCOUNTER — Encounter (INDEPENDENT_AMBULATORY_CARE_PROVIDER_SITE_OTHER): Payer: Self-pay

## 2022-04-29 ENCOUNTER — Encounter: Payer: Self-pay | Admitting: Podiatry

## 2022-05-08 ENCOUNTER — Telehealth (HOSPITAL_BASED_OUTPATIENT_CLINIC_OR_DEPARTMENT_OTHER): Payer: Self-pay | Admitting: Nurse Practitioner

## 2022-05-08 NOTE — Telephone Encounter (Signed)
Pt dropped off form for Handicap placard for provider completion. Paid $25 admin fee and is aware of 7-10 business day wait for completion. Form is in provider's red dot tray. Pt is requesting call when forms are done. Please advise.

## 2022-05-08 NOTE — Telephone Encounter (Signed)
I have paper work and will call patient when completed.

## 2022-05-13 ENCOUNTER — Other Ambulatory Visit (HOSPITAL_COMMUNITY): Payer: Self-pay

## 2022-05-19 ENCOUNTER — Other Ambulatory Visit: Payer: Self-pay | Admitting: Nurse Practitioner

## 2022-05-19 DIAGNOSIS — Z1231 Encounter for screening mammogram for malignant neoplasm of breast: Secondary | ICD-10-CM

## 2022-05-19 IMAGING — DX DG LUMBAR SPINE COMPLETE 4+V
4 series · 4 of 4 positions shown · non-contrast
Comparison: 03/26/2016

CLINICAL DATA: Right radicular symptoms down leg.

EXAM:
LUMBAR SPINE - COMPLETE 4+ VIEW

[l-spine ap]
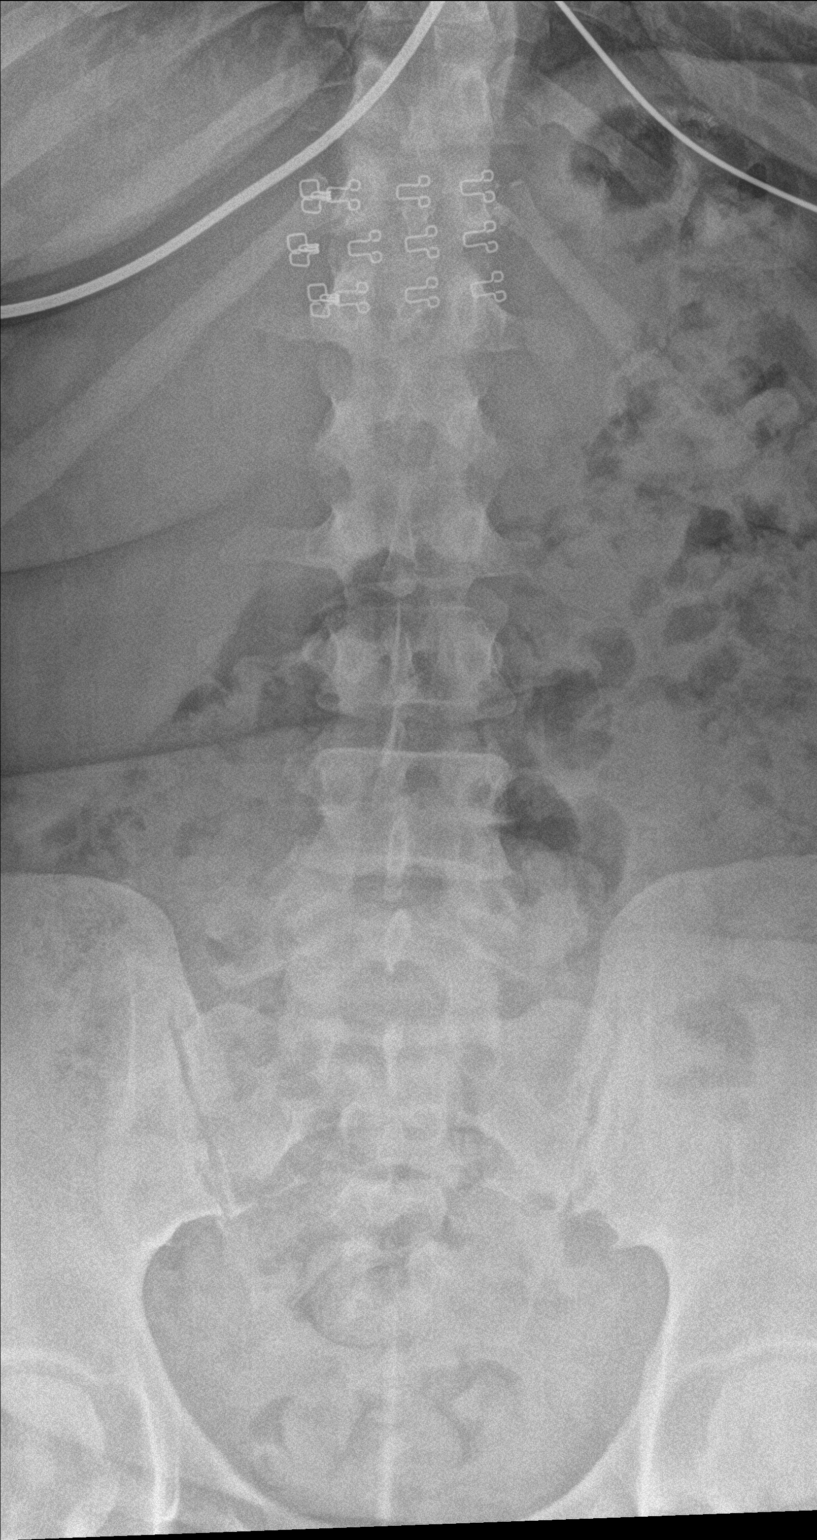

[l-spine obl (1 of 2)]
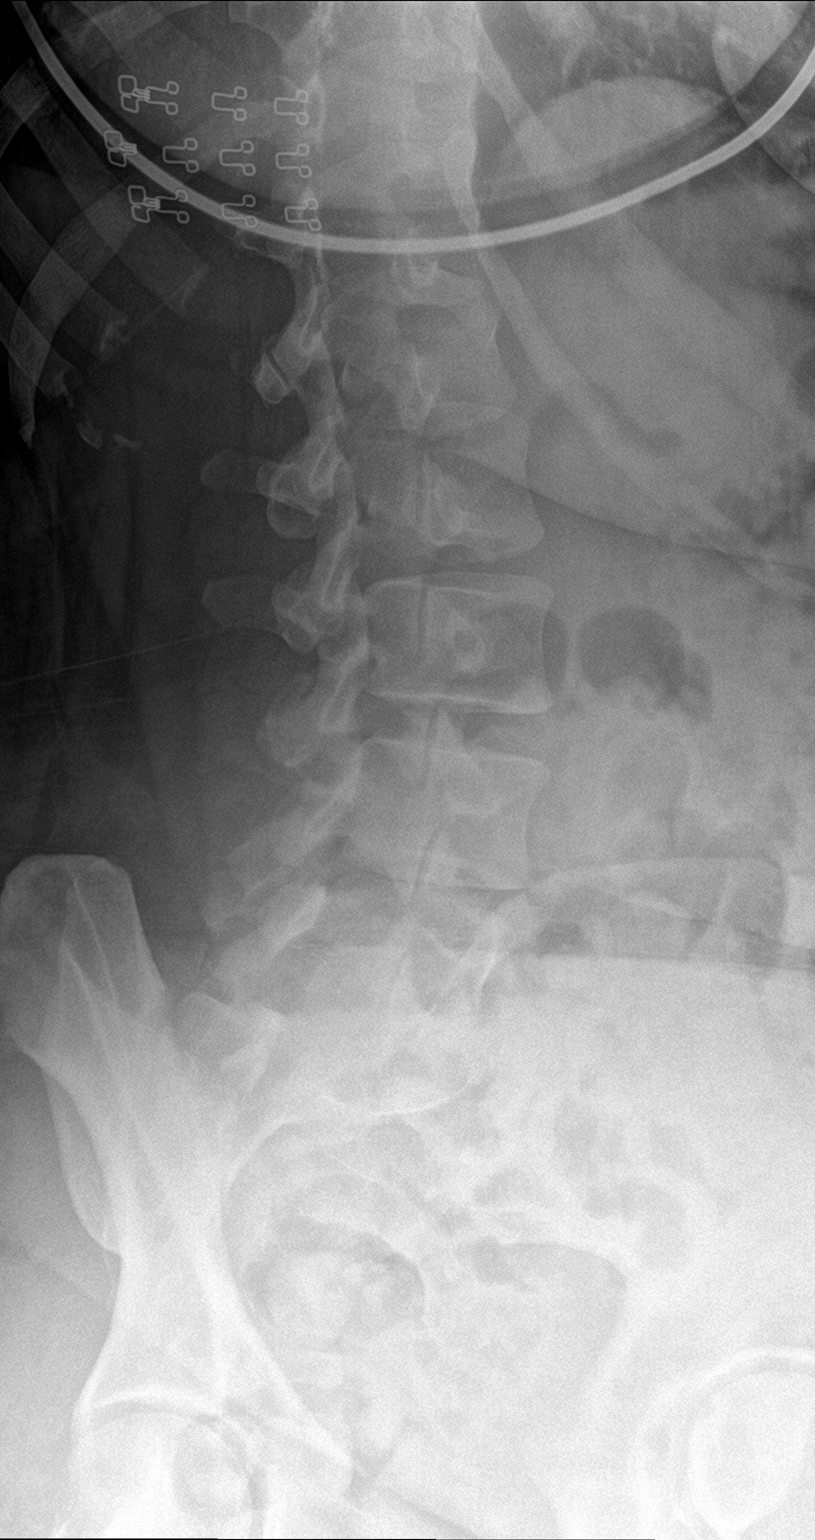

[l-spine obl (2 of 2)]
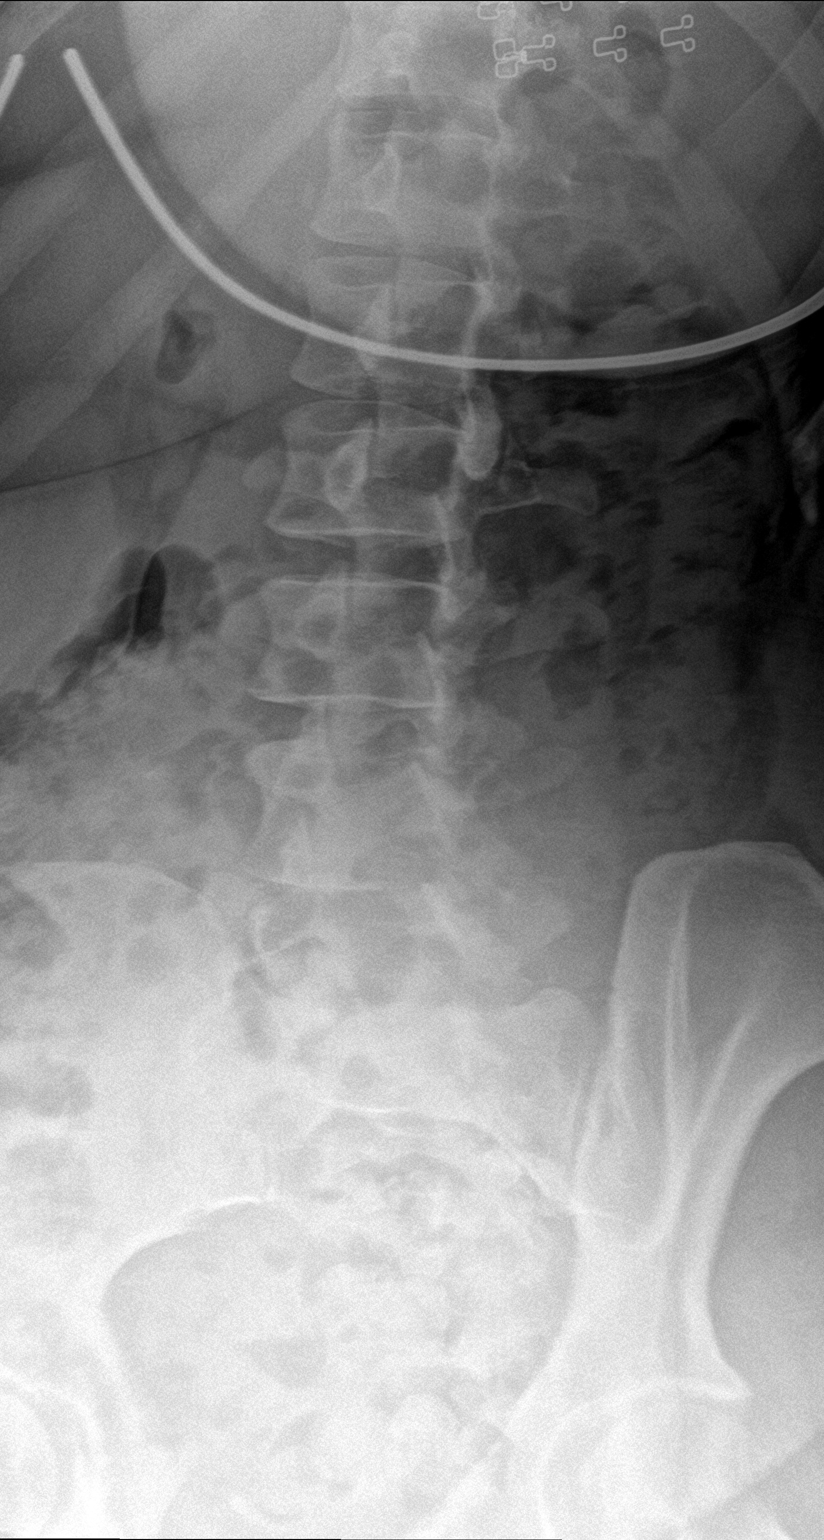

[l-spine lat]
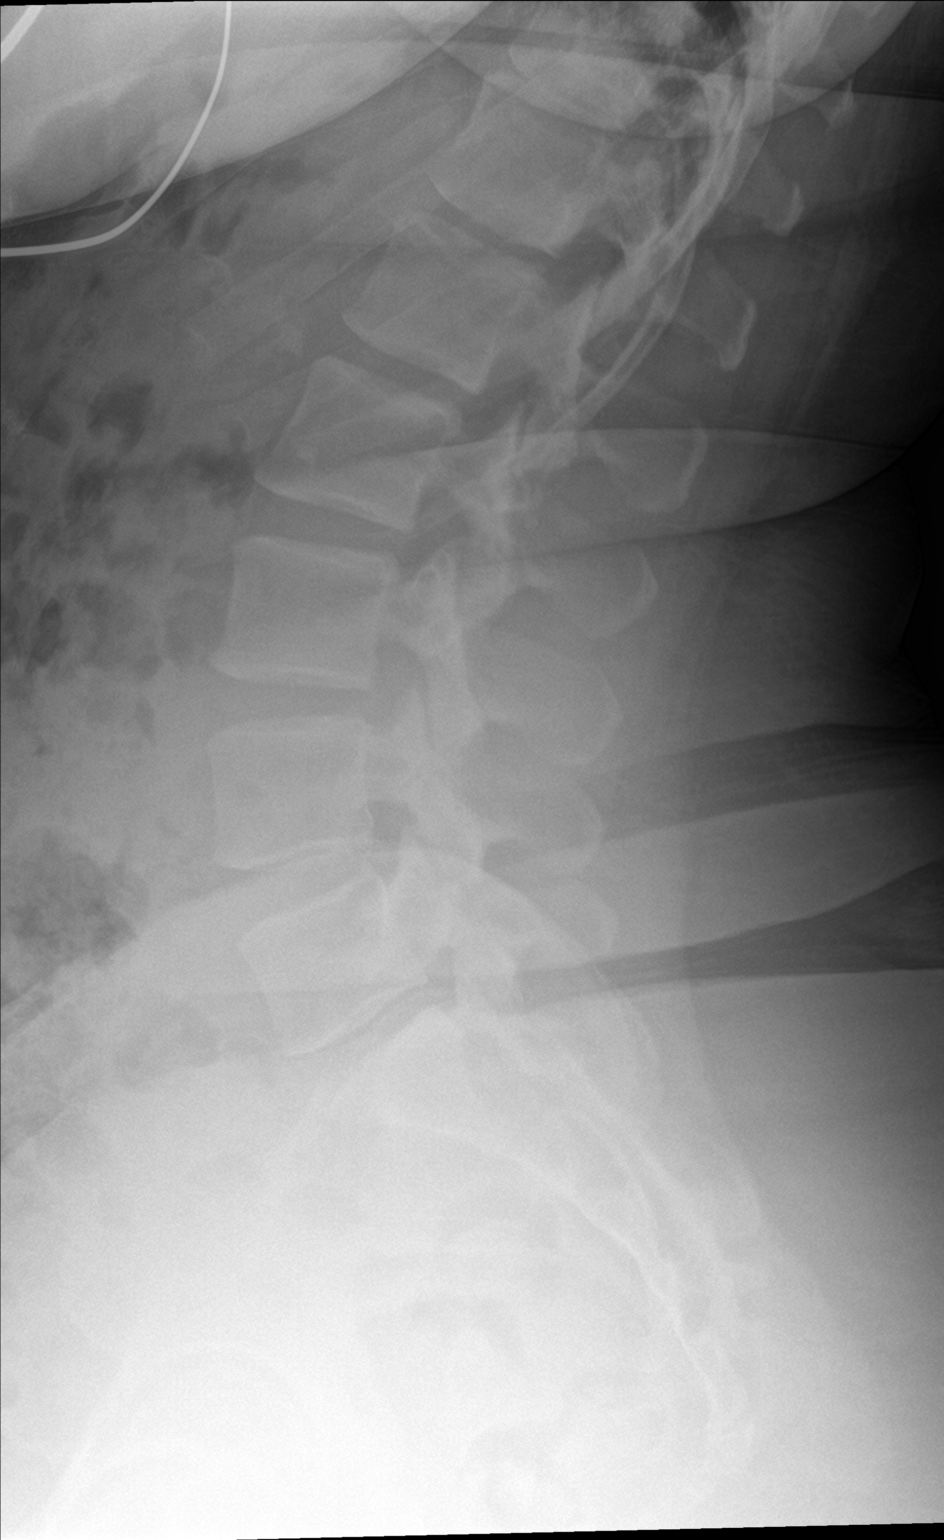

[4 of 4 positions shown; findings below may reference images not displayed]

FINDINGS: The alignment of the lumbar spine appears normal. The vertebral body
heights and disc spaces are well preserved. There is no radiopaque
foreign body or soft tissue calcifications.
IMPRESSION: 1. Normal exam.

## 2022-05-19 NOTE — Telephone Encounter (Signed)
Pt called regarding paperwork will discuss with provider the update on this. Will call pt when the paperwork is completed.

## 2022-05-19 NOTE — Telephone Encounter (Signed)
Called pt and let her know paperwork is ready and she will be picking it up today (05/19/22) at the front. She is aware if she doesn't make it by 5 when we close that we open back up at 8 am tomorrow. Pt showed understanding.

## 2022-05-19 NOTE — Telephone Encounter (Signed)
Paperwork completed for handicap placard for patient and provided to Maralyn Sago C to contact patient for pick-up.

## 2022-05-19 NOTE — Telephone Encounter (Signed)
Please review

## 2022-05-20 ENCOUNTER — Encounter (INDEPENDENT_AMBULATORY_CARE_PROVIDER_SITE_OTHER): Payer: Self-pay

## 2022-05-21 ENCOUNTER — Other Ambulatory Visit (HOSPITAL_COMMUNITY): Payer: Self-pay

## 2022-05-21 ENCOUNTER — Ambulatory Visit: Payer: No Typology Code available for payment source

## 2022-05-24 NOTE — Telephone Encounter (Signed)
Scheduled for f/u in August.

## 2022-05-26 ENCOUNTER — Ambulatory Visit
Admission: RE | Admit: 2022-05-26 | Discharge: 2022-05-26 | Disposition: A | Discharge status: Home / Self Care | Payer: No Typology Code available for payment source | Source: Ambulatory Visit | Attending: Nurse Practitioner | Admitting: Nurse Practitioner

## 2022-05-26 ENCOUNTER — Ambulatory Visit: Payer: No Typology Code available for payment source

## 2022-05-26 DIAGNOSIS — Z1231 Encounter for screening mammogram for malignant neoplasm of breast: Secondary | ICD-10-CM

## 2022-05-28 ENCOUNTER — Ambulatory Visit: Payer: No Typology Code available for payment source | Admitting: Adult Health

## 2022-05-29 ENCOUNTER — Other Ambulatory Visit (HOSPITAL_COMMUNITY): Payer: Self-pay

## 2022-06-04 ENCOUNTER — Ambulatory Visit (INDEPENDENT_AMBULATORY_CARE_PROVIDER_SITE_OTHER): Payer: No Typology Code available for payment source | Admitting: Bariatrics

## 2022-06-05 DIAGNOSIS — J0140 Acute pansinusitis, unspecified: Secondary | ICD-10-CM | POA: Diagnosis not present

## 2022-06-05 DIAGNOSIS — R062 Wheezing: Secondary | ICD-10-CM | POA: Diagnosis not present

## 2022-06-05 DIAGNOSIS — J209 Acute bronchitis, unspecified: Secondary | ICD-10-CM | POA: Diagnosis not present

## 2022-06-05 DIAGNOSIS — Z03818 Encounter for observation for suspected exposure to other biological agents ruled out: Secondary | ICD-10-CM | POA: Diagnosis not present

## 2022-06-05 DIAGNOSIS — Z20822 Contact with and (suspected) exposure to covid-19: Secondary | ICD-10-CM | POA: Diagnosis not present

## 2022-06-05 DIAGNOSIS — J029 Acute pharyngitis, unspecified: Secondary | ICD-10-CM | POA: Diagnosis not present

## 2022-06-05 DIAGNOSIS — Z6836 Body mass index (BMI) 36.0-36.9, adult: Secondary | ICD-10-CM | POA: Diagnosis not present

## 2022-06-06 ENCOUNTER — Other Ambulatory Visit (HOSPITAL_COMMUNITY): Payer: Self-pay

## 2022-06-09 ENCOUNTER — Ambulatory Visit: Payer: No Typology Code available for payment source | Admitting: Podiatry

## 2022-06-10 ENCOUNTER — Ambulatory Visit: Payer: No Typology Code available for payment source | Admitting: Adult Health

## 2022-06-11 ENCOUNTER — Encounter (INDEPENDENT_AMBULATORY_CARE_PROVIDER_SITE_OTHER): Payer: Self-pay

## 2022-06-16 ENCOUNTER — Encounter: Payer: Self-pay | Admitting: Adult Health

## 2022-06-16 ENCOUNTER — Ambulatory Visit (INDEPENDENT_AMBULATORY_CARE_PROVIDER_SITE_OTHER): Payer: 59 | Admitting: Adult Health

## 2022-06-16 VITALS — Ht 67.0 in

## 2022-06-16 DIAGNOSIS — R69 Illness, unspecified: Secondary | ICD-10-CM | POA: Diagnosis not present

## 2022-06-16 DIAGNOSIS — F3341 Major depressive disorder, recurrent, in partial remission: Secondary | ICD-10-CM

## 2022-06-16 DIAGNOSIS — F32A Depression, unspecified: Secondary | ICD-10-CM

## 2022-06-16 DIAGNOSIS — R4589 Other symptoms and signs involving emotional state: Secondary | ICD-10-CM | POA: Insufficient documentation

## 2022-06-16 MED ORDER — SERTRALINE HCL 50 MG PO TABS: 50.0000 mg | ORAL_TABLET | Freq: Every day | ORAL | 3 refills | Status: DC

## 2022-06-16 NOTE — Progress Notes (Signed)
Patient ID: Stacey Palmer, female   DOB: 11-08-1980, 41 y.o.   MRN: [id number]   TELEHEALTH GYNECOLOGY VISIT ENCOUNTER NOTE  Provider location: [facility]  Patient location: her mom's home in [place]    I connected with Stacey Palmer on 06/16/22 at  2:10 PM EDT by telephone and verified that I am speaking with the correct person using two identifiers. Patient was unable to do MyChart audiovisual encounter due to technical difficulties, she tried several times.    I discussed the limitations, risks, security and privacy concerns of performing an evaluation and management service by telephone and the availability of in person appointments. I also discussed with the patient that there may be a patient responsible charge related to this service. The patient expressed understanding and agreed to proceed.   History:  Stacey Palmer is a 41 y.o. [id number] female being evaluated today for depression and feeling sad, she is better, but has been without meds for a week. She is on gabapentin and feels tired and sleeping at times. She denies any SI or other concerns.       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   pHQ 9 SCORE OF 12 IN 12/2018, score of 0 in 03/2019 on medication   Dry eyes 12/26/2018   Encounter for gynecological examination with Papanicolaou smear of cervix 01/28/2022   Encounter for screening fecal occult blood testing 01/28/2022   Family planning 01/28/2022   Fatigue 12/06/2019   Feeling sad 01/28/2022   H/O gastric sleeve 2018   Hair loss 12/06/2019   History of mammogram 2021   Obesity    Obesity (BMI 30-39.9) 06/11/2015   S/p bariatric surgery in 2017   Plantar fasciitis    Pregnancy examination or test, negative result 01/28/2022   Skin tag 06/05/2014   Sleep apnea    URI (upper respiratory  infection) 08/23/2012   Vaginal irritation 09/18/2020   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   Health Maintenance:  Normal pap and negative HRHPV on 01/28/22.  Normal mammogram on 05/26/22  Review of Systems:  Pertinent items noted in HPI and remainder of comprehensive ROS otherwise negative.  Physical Exam:   General:  Alert, oriented and cooperative.   Mental Status: Normal mood and affect perceived. Normal judgment and thought content.  Physical exam deferred due to nature of the encounter Ht 5\' 7"  (1.702 m)   LMP 06/11/2022 (Approximate)   BMI 41.35 kg/m      06/16/2022    2:31 PM 04/02/2022    4:21 PM 01/28/2022    3:52 PM  Depression screen PHQ 2/9  Decreased Interest 1 0 0  Down, Depressed, Hopeless 1 2 1   PHQ - 2 Score 2 2 1   Altered sleeping 0 2 0  Tired, decreased energy 1 2 1   Change in appetite 1 0 0  Feeling bad or failure about yourself  0 2 0  Trouble concentrating 0  0 0  Moving slowly or fidgety/restless 0 0 0  Suicidal thoughts 0 0 0  PHQ-9 Score 4 8 2        06/16/2022    2:33 PM 04/02/2022    4:21 PM 01/28/2022    3:52 PM 12/18/2020    4:30 PM  GAD 7 : Generalized Anxiety Score  Nervous, Anxious, on Edge 0 0 0   Control/stop worrying 0 0 0   Worry too much - different things 0 2 0   Trouble relaxing 1 0 0   Restless 0 0 0   Easily annoyed or irritable 0 0 0   Afraid - awful might happen 0 0 0   Total GAD 7 Score 1 2 0      Information is confidential and restricted. Go to Review Flowsheets to unlock data.     Upstream - 06/16/22 1455       Pregnancy Intention Screening   Does the patient  want to become pregnant in the next year? No    Does the patient's partner want to become pregnant in the next year? No    Would the patient like to discuss contraceptive options today? No      Contraception Wrap Up   Current Method Female Sterilization    End Method Female Sterilization    Contraception Counseling Provided No               Labs and Imaging No results found for this or any previous visit (from the past 336 hour(s)). MM 3D SCREEN BREAST BILATERAL  Result Date: 05/27/2022 CLINICAL DATA:  Screening. EXAM: DIGITAL SCREENING BILATERAL MAMMOGRAM WITH TOMOSYNTHESIS AND CAD TECHNIQUE: Bilateral screening digital craniocaudal and mediolateral oblique mammograms were obtained. Bilateral screening digital breast tomosynthesis was performed. The images were evaluated with computer-aided detection. COMPARISON:  Previous exam(s). ACR Breast Density Category b: There are scattered areas of fibroglandular density. FINDINGS: There are no findings suspicious for malignancy. IMPRESSION: No mammographic evidence of malignancy. A result letter of this screening mammogram will be mailed directly to the patient. RECOMMENDATION: Screening mammogram in one year. (Code:SM-B-01Y) BI-RADS CATEGORY  1: Negative. Electronically Signed   By: 05/29/2022 M.D.   On: 05/27/2022 14:09      Assessment and Plan:     1. Depression, unspecified depression type Feeling better on Zoloft 50 mg   2. Feeling sad 05/29/2022 better   3. Recurrent major depressive disorder, in partial remission (HCC) Will continue zoloft at 50 mg  - sertraline (ZOLOFT) 50 MG tablet; Take 1 tablet (50 mg total) by mouth daily.  Dispense: 90 tablet; Refill: 3 See if [facility] in [place] can get from [facility] here     Follow up in about 8 weeks, can be tele visit   I discussed the assessment and treatment plan with the patient. The patient was provided an opportunity to ask questions and all were answered. The patient agreed with  the plan and demonstrated an understanding of the instructions.   The patient was advised to call back or seek an in-person evaluation/go to the ED if the symptoms worsen or if the condition fails to improve as anticipated.  I provided 10 minutes of non-face-to-face time during this encounter. I was in my office at [facility] during this encounter   [facility], NP [facility], [facility]

## 2022-06-18 ENCOUNTER — Ambulatory Visit (INDEPENDENT_AMBULATORY_CARE_PROVIDER_SITE_OTHER): Payer: No Typology Code available for payment source | Admitting: Bariatrics

## 2022-06-18 ENCOUNTER — Ambulatory Visit: Payer: No Typology Code available for payment source | Admitting: Podiatry

## 2022-06-19 ENCOUNTER — Ambulatory Visit (INDEPENDENT_AMBULATORY_CARE_PROVIDER_SITE_OTHER): Payer: No Typology Code available for payment source | Admitting: Bariatrics

## 2022-06-23 ENCOUNTER — Ambulatory Visit (INDEPENDENT_AMBULATORY_CARE_PROVIDER_SITE_OTHER): Payer: 59 | Admitting: Nurse Practitioner

## 2022-06-23 ENCOUNTER — Other Ambulatory Visit (HOSPITAL_COMMUNITY): Payer: Self-pay

## 2022-06-23 ENCOUNTER — Encounter (HOSPITAL_BASED_OUTPATIENT_CLINIC_OR_DEPARTMENT_OTHER): Payer: Self-pay | Admitting: Nurse Practitioner

## 2022-06-23 DIAGNOSIS — F411 Generalized anxiety disorder: Secondary | ICD-10-CM | POA: Diagnosis not present

## 2022-06-23 DIAGNOSIS — G629 Polyneuropathy, unspecified: Secondary | ICD-10-CM | POA: Diagnosis not present

## 2022-06-23 DIAGNOSIS — R69 Illness, unspecified: Secondary | ICD-10-CM | POA: Diagnosis not present

## 2022-06-23 DIAGNOSIS — F3341 Major depressive disorder, recurrent, in partial remission: Secondary | ICD-10-CM

## 2022-06-23 MED ORDER — SERTRALINE HCL 50 MG PO TABS
50.0000 mg | ORAL_TABLET | Freq: Every day | ORAL | 1 refills | Status: DC
  Filled 2022-06-23 – 2022-07-04 (×3): qty 30, 30d supply, fill #0

## 2022-06-23 MED ORDER — GABAPENTIN 100 MG PO CAPS
100.0000 mg | ORAL_CAPSULE | Freq: Every day | ORAL | 1 refills | Status: DC
  Filled 2022-06-23 – 2022-06-24 (×2): qty 90, 30d supply, fill #0

## 2022-06-23 NOTE — Progress Notes (Signed)
Virtual Visit Encounter telephone visit.   I connected with  Stacey Palmer on 06/24/22 at  2:10 PM EDT by secure audio telemedicine application. I verified that I am speaking with the correct person using two identifiers.   I introduced myself as a Publishing rights manager with the practice. The limitations of evaluation and management by telemedicine discussed with the patient and the availability of in person appointments. The patient expressed verbal understanding and consent to proceed.  Participating parties in this visit include: Myself and patient  The patient is: Patient Location: Home I am: Provider Location: Office/Clinic Subjective:    CC and HPI: Stacey Palmer is a 41 y.o. year old female presenting for follow up of pain and stress. Patient reports the following: Mulki tells me that she has had some great news recently. She has been off of work for several weeks now due to the pain in her feet, which has been very difficult for her. She reports that she called to check on her neurology appointment and discovered that they had a cancellation and she could be seen tomorrow for her visit. She tells me that this is wonderful news because she was going to have to wait until the end of October for her original appointment.  She has been struggling with affording her medication since she stopped working and she tells me that she has had to stop her medicines. This has caused increased stress on her. 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 She reports that she starts her new job on the 28th and she is very excited to be back working again.   She tells me the pain in her legs has started to affect her legs/calves in addition to her feet. She reports the medication (gabapentin) does help with the pain but makes her so tired she doesn't take it very often. She reports that the pain seems to ease up on  some days, but others it is very intense. She is anxious to know what is causing the pain and how to best correct it. She denies any pain in her lower back or pain in her thighs. She is not having cramping in the legs at night or uncontrolled muscle movements.   She would like to start back on her medication for her mood now that she has insurance.   Past medical history, Surgical history, Family history not pertinant except as noted below, Social history, Allergies, and medications have been entered into the medical record, reviewed, and corrections made.   Review of Systems:  All review of systems negative except what is listed in the HPI  Objective:    Alert and oriented x 4 Speaking in clear sentences with no shortness of breath. No distress.  Impression and Recommendations:    Problem List Items Addressed This Visit     Depression    Depression and anxiety symptoms have worsened somewhat since our last visit. Unfortunately when she stopped working due to her leg pain, she lost her insurance and has been unable to afford her medications. She was on a low dose, therefore, stopping the medication was not as concerning as it could have been. I do feel that restarting the medication will be helpful for her mood. I have sent a new prescription to the pharmacy for her. We will plan to follow-up in 3 months to see how she is doing.  Relevant Medications   sertraline (ZOLOFT) 50 MG tablet   GAD (generalized anxiety disorder)    See depression note from same day.       Relevant Medications   sertraline (ZOLOFT) 50 MG tablet   Neuropathy - Primary    Bilateral lower extremity pain with nerve-like sensation of unknown etiology. The pain now appears to be traveling up into her lower legs from her feet/ankles. Fortunately she will be seeing the neurologist tomorrow, which I hope will bring her some answers. We discussed the option of lowering the gabapentin dose to the 100mg  so that she  can take 1-3 tablets as needed based on her pain level. She is in agreement to this. I will send this to the pharmacy for her today. I will follow with her neurology appointment. I recommend that she continue to massage her legs and use her cain for ambulation to help prevent falls.       Relevant Medications   gabapentin (NEURONTIN) 100 MG capsule    orders and follow up as documented in EMR I discussed the assessment and treatment plan with the patient. The patient was provided an opportunity to ask questions and all were answered. The patient agreed with the plan and demonstrated an understanding of the instructions.   The patient was advised to call back or seek an in-person evaluation if the symptoms worsen or if the condition fails to improve as anticipated.  Follow-Up: in 3 months  I provided 22 minutes of non-face-to-face interaction with this non face-to-face encounter including intake, same-day documentation, and chart review.   , NP , DNP, AGNP-c [facility] at [facility] [phone] (fax)

## 2022-06-24 ENCOUNTER — Other Ambulatory Visit (HOSPITAL_COMMUNITY): Payer: Self-pay

## 2022-06-24 ENCOUNTER — Ambulatory Visit: Payer: 59 | Admitting: Neurology

## 2022-06-24 ENCOUNTER — Other Ambulatory Visit (HOSPITAL_BASED_OUTPATIENT_CLINIC_OR_DEPARTMENT_OTHER): Payer: Self-pay | Admitting: Nurse Practitioner

## 2022-06-24 ENCOUNTER — Encounter: Payer: Self-pay | Admitting: Neurology

## 2022-06-24 VITALS — BP 129/87 | HR 69 | Ht 67.0 in | Wt 270.0 lb

## 2022-06-24 DIAGNOSIS — R202 Paresthesia of skin: Secondary | ICD-10-CM | POA: Diagnosis not present

## 2022-06-24 DIAGNOSIS — J3089 Other allergic rhinitis: Secondary | ICD-10-CM

## 2022-06-24 NOTE — Assessment & Plan Note (Signed)
Depression and anxiety symptoms have worsened somewhat since our last visit. Unfortunately when she stopped working due to her leg pain, she lost her insurance and has been unable to afford her medications. She was on a low dose, therefore, stopping the medication was not as concerning as it could have been. I do feel that restarting the medication will be helpful for her mood. I have sent a new prescription to the pharmacy for her. We will plan to follow-up in 3 months to see how she is doing.

## 2022-06-24 NOTE — Assessment & Plan Note (Signed)
Bilateral lower extremity pain with nerve-like sensation of unknown etiology. The pain now appears to be traveling up into her lower legs from her feet/ankles. Fortunately she will be seeing the neurologist tomorrow, which I hope will bring her some answers. We discussed the option of lowering the gabapentin dose to the 100mg  so that she can take 1-3 tablets as needed based on her pain level. She is in agreement to this. I will send this to the pharmacy for her today. I will follow with her neurology appointment. I recommend that she continue to massage her legs and use her cain for ambulation to help prevent falls.

## 2022-06-24 NOTE — Patient Instructions (Signed)
Nerve testing of the right arm and leg  Restart gabapentin 100mg  at bedtime     ELECTROMYOGRAM AND NERVE CONDUCTION STUDIES (EMG/NCS) INSTRUCTIONS  How to Prepare The neurologist conducting the EMG will need to know if you have certain medical conditions. Tell the neurologist and other EMG lab personnel if you: Have a pacemaker or any other electrical medical device Take blood-thinning medications Have hemophilia, a blood-clotting disorder that causes prolonged bleeding Bathing Take a shower or bath shortly before your exam in order to remove oils from your skin. Don't apply lotions or creams before the exam.  What to Expect You'll likely be asked to change into a hospital gown for the procedure and lie down on an examination table. The following explanations can help you understand what will happen during the exam.  Electrodes. The neurologist or a technician places surface electrodes at various locations on your skin depending on where you're experiencing symptoms. Or the neurologist may insert needle electrodes at different sites depending on your symptoms.  Sensations. The electrodes will at times transmit a tiny electrical current that you may feel as a twinge or spasm. The needle electrode may cause discomfort or pain that usually ends shortly after the needle is removed. If you are concerned about discomfort or pain, you may want to talk to the neurologist about taking a short break during the exam.  Instructions. During the needle EMG, the neurologist will assess whether there is any spontaneous electrical activity when the muscle is at rest - activity that isn't present in healthy muscle tissue - and the degree of activity when you slightly contract the muscle.  He or she will give you instructions on resting and contracting a muscle at appropriate times. Depending on what muscles and nerves the neurologist is examining, he or she may ask you to change positions during the exam.   After your EMG You may experience some temporary, minor bruising where the needle electrode was inserted into your muscle. This bruising should fade within several days. If it persists, contact your primary care doctor.

## 2022-06-24 NOTE — Assessment & Plan Note (Signed)
>>  ASSESSMENT AND PLAN FOR DEPRESSION WRITTEN ON 06/24/2022  7:04 PM BY ,  E, NP  Depression and anxiety symptoms have worsened somewhat since our last visit. Unfortunately when she stopped working due to her leg pain, she lost her insurance and has been unable to afford her medications. She was on a low dose, therefore, stopping the medication was not as concerning as it could have been. I do feel that restarting the medication will be helpful for her mood. I have sent a new prescription to the pharmacy for her. We will plan to follow-up in 3 months to see how she is doing.

## 2022-06-24 NOTE — Progress Notes (Signed)
[facility] Neurology Division Clinic Note - Initial Visit   Date: 06/24/2022   Stacey Palmer MRN: [id number] DOB: 06/18/1981   Dear Dr. Lilian Kapur:  Thank you for your kind referral of Stacey Palmer for consultation of bilateral feet pain. Although her history is well known to you, please allow [place] to reiterate it for the purpose of our medical record. The patient was accompanied to the clinic by self.   Stacey Palmer is a 41 y.o. female with depression presenting for evaluation of bilateral feet pain.   IMPRESSION/PLAN: Bilateral feet and lower leg pain/paresthesias, intermittent.  Exam shows normal strength, sensation, and reflexes in the feet which makes large fiber neuropathy less likely. Further, her symptoms are very intermittent and brief, whereas neuropathy tends to be constant. However, she tells me that prior NCS/EMG of the right leg at [facility] shows neuropathy.  I will requested this study to review personally.  Symptoms are also too widespread for tarsal tunnel syndrome.  Because of the discrepancy of her exam and electrodiagnostic testing, repeat NCS/EMG of bilateral leg is recommended.   Adjust gabapentin to 100mg  qhs due to sedation  2.    Bilateral hand paresthesias, intermittent.  Consider NCS/EMG of the upper extremities, based on the findings of EMG of the legs   ------------------------------------------------------------- History of present illness: Starting around 2021, she began having burning sensation over the soles of the feet and anterior lower legs and thigh. It is worse with walking or activity.  Rest, ice, and topical anti-inflammatory ointment helps her pain.  Symptoms are intermittent about once per week and lasts an hour.  She was started on gabapentin 300mg  at bedtime which helps but makes her very sleepy.  She also feels she is gaining weight on the medication.  She has been walking with a cane since June. She had EMG of the right leg at  [facility], results not available. Occasionally, she also complaints of numbness and burning involving the hands.  No weakness of the arms or legs.   She will be working in at [place].   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   pHQ 9 SCORE OF 12 IN 12/2018, score of 0 in 03/2019 on medication   Dry eyes 12/26/2018   Encounter for gynecological examination with Papanicolaou smear of cervix 01/28/2022   Encounter for screening fecal occult blood testing 01/28/2022   Family planning 01/28/2022   Fatigue 12/06/2019   Feeling sad 01/28/2022   H/O gastric sleeve 2018   Hair loss 12/06/2019   History of mammogram 2021   Obesity    Obesity (BMI 30-39.9) 06/11/2015   S/p bariatric surgery in 2017   Plantar fasciitis    Pregnancy examination or test, negative result 01/28/2022   Skin tag 06/05/2014   Sleep apnea    URI (upper respiratory infection) 08/23/2012   Vaginal irritation 09/18/2020   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   Cholecalciferol (VITAMIN D3 PO) Take 1 capsule by mouth once a week.   fluticasone (FLONASE) 50 MCG/ACT nasal spray Place 2 sprays into both nostrils daily.    gabapentin (NEURONTIN) 100 MG capsule Take 1 - 3 capsules by mouth at bedtime.   ipratropium (ATROVENT) 0.03 % nasal spray Place 2 sprays into both nostrils 2 (two) times daily.   sertraline (ZOLOFT) 50 MG tablet Take 1 tablet by mouth daily.   No facility-administered encounter medications on file as of 06/24/2022.    Allergies: No Known Allergies  Family History: Family History  Problem Relation Age of Onset   Hypertension Mother    Kidney disease Father    Diabetes Father    Breast cancer Paternal Aunt    Dementia Paternal Grandmother    Asthma Daughter     Social History: Social History   Tobacco Use   Smoking status: Never    Passive exposure: Never   Smokeless tobacco: Never  Vaping Use   Vaping Use: Never used  Substance Use Topics   Alcohol use: No   Drug use: No   Social History   Social History Narrative   Tobacco use, amount per day now:   Past tobacco use, amount per day:   How many years did you use tobacco:   Alcohol use (drinks per week): Once in a while.   Diet:   Do you drink/eat things with caffeine:   Marital status:  Single.                                What year were you married?   Do you live in a house, apartment, assisted living, condo, trailer, etc.?   Is it one or more stories?   How many persons live in your home?   Do you have pets in your home?( please list) Yes Cats   Highest Level of education completed?   Current or past profession:   Do you exercise? Yes                                 Type and how often?   Do you have a living will? No.   Do you have a DNR form? No                                  If not, do you want to discuss one?   Do you have signed POA/HPOA forms? No.                       If so, please bring to you appointment      Do you have any difficulty bathing or dressing yourself? No.   Do you have any difficulty preparing food or eating? No.   Do you have any difficulty managing your medications? No.   Do you  have any difficulty managing your finances? No.   Do you have any difficulty affording your medications? No.  Right Handed    Lives in a one story home     Vital Signs:  BP 129/87   Pulse 69   Ht 5\' 7"  (1.702 m)   Wt 270 lb (122.5 kg)   LMP 06/11/2022 (Approximate)   SpO2 100%   BMI 42.29 kg/m   Neurological Exam: MENTAL STATUS including orientation to time, place, person, recent and remote memory, attention span and concentration, language, and fund of knowledge is normal.  Speech is not dysarthric.  CRANIAL NERVES: II:  No visual field defects.   III-IV-VI: Pupils equal round and reactive to light.  Normal conjugate, extra-ocular eye movements in all directions of gaze.  No nystagmus.  No ptosis.   V:  Normal facial sensation.    VII:  Normal facial symmetry and movements.   VIII:  Normal hearing and vestibular function.   IX-X:  Normal palatal movement.   XI:  Normal shoulder shrug and head rotation.   XII:  Normal tongue strength and range of motion, no deviation or fasciculation.  MOTOR:  Motor strength is 5/5 throughout, including distal feet muscles. No atrophy, fasciculations or abnormal movements.  No pronator drift.   MSRs:  Right        Left                  brachioradialis 2+  2+  biceps 2+  2+  triceps 2+  2+  patellar 2+  2+  ankle jerk 2+  2+  Hoffman no  no  plantar response down  down   SENSORY:  Normal and symmetric perception of light touch, pinprick, vibration, and proprioception.  Romberg's sign absent.   COORDINATION/GAIT: Normal finger-to- nose-finger.  Intact rapid alternating movements bilaterally. Gait is slow, assisted with a cane.  Stressed gait intact.     Thank you for allowing me to participate in patient's care.  If I can answer any additional questions, I would be pleased to do so.    Sincerely,     K. 08/11/2022, DO

## 2022-06-24 NOTE — Assessment & Plan Note (Signed)
See depression note from same day.  

## 2022-06-25 ENCOUNTER — Other Ambulatory Visit (HOSPITAL_COMMUNITY): Payer: Self-pay

## 2022-06-25 MED ORDER — LEVOCETIRIZINE DIHYDROCHLORIDE 5 MG PO TABS
5.0000 mg | ORAL_TABLET | Freq: Every evening | ORAL | 0 refills | Status: DC
  Filled 2022-06-25 – 2022-07-04 (×2): qty 30, 30d supply, fill #0

## 2022-06-26 ENCOUNTER — Other Ambulatory Visit (HOSPITAL_COMMUNITY): Payer: Self-pay

## 2022-07-01 ENCOUNTER — Telehealth: Payer: Self-pay | Admitting: Neurology

## 2022-07-01 NOTE — Telephone Encounter (Signed)
Called and left message for Pt to call office

## 2022-07-01 NOTE — Telephone Encounter (Signed)
She can try taking (2) tablets of gabapentin 100mg  at bedtime and see if that helps.  She reports sedation at 300mg  dose so we tried a lower dose.

## 2022-07-01 NOTE — Telephone Encounter (Signed)
Patient called and said she is having very bad burning pain in both of her feet today.   She wants Dr. Allena Katz to be aware she is hurting badly today.  Patient is on wait list for EMG.

## 2022-07-02 NOTE — Telephone Encounter (Signed)
Called Patient and reported Dr Allena Katz orders. She understood.

## 2022-07-03 ENCOUNTER — Ambulatory Visit (INDEPENDENT_AMBULATORY_CARE_PROVIDER_SITE_OTHER): Payer: No Typology Code available for payment source | Admitting: Bariatrics

## 2022-07-04 ENCOUNTER — Other Ambulatory Visit (HOSPITAL_COMMUNITY): Payer: Self-pay

## 2022-07-08 ENCOUNTER — Telehealth: Payer: Self-pay | Admitting: Podiatry

## 2022-07-08 NOTE — Telephone Encounter (Signed)
Pt wants to know if she can return to work sooner than initial leave. She sees the neurologist on Oct 10 but she would like to return to work sooner if possible on light duty.She states she doesn't have any income coming in at this time.   Please advise.

## 2022-07-09 ENCOUNTER — Encounter: Payer: Self-pay | Admitting: Podiatry

## 2022-07-10 ENCOUNTER — Encounter: Payer: Self-pay | Admitting: Podiatry

## 2022-07-11 ENCOUNTER — Encounter (HOSPITAL_BASED_OUTPATIENT_CLINIC_OR_DEPARTMENT_OTHER): Payer: Self-pay | Admitting: Emergency Medicine

## 2022-07-11 ENCOUNTER — Emergency Department (HOSPITAL_BASED_OUTPATIENT_CLINIC_OR_DEPARTMENT_OTHER): Payer: PRIVATE HEALTH INSURANCE

## 2022-07-11 ENCOUNTER — Other Ambulatory Visit: Payer: Self-pay

## 2022-07-11 ENCOUNTER — Emergency Department (HOSPITAL_BASED_OUTPATIENT_CLINIC_OR_DEPARTMENT_OTHER)
Admission: EM | Admit: 2022-07-11 | Discharge: 2022-07-11 | Disposition: A | Discharge status: Home / Self Care | Payer: PRIVATE HEALTH INSURANCE | Attending: Emergency Medicine | Admitting: Emergency Medicine

## 2022-07-11 DIAGNOSIS — Y990 Civilian activity done for income or pay: Secondary | ICD-10-CM | POA: Insufficient documentation

## 2022-07-11 DIAGNOSIS — Y93E5 Activity, floor mopping and cleaning: Secondary | ICD-10-CM | POA: Insufficient documentation

## 2022-07-11 DIAGNOSIS — W010XXA Fall on same level from slipping, tripping and stumbling without subsequent striking against object, initial encounter: Secondary | ICD-10-CM | POA: Insufficient documentation

## 2022-07-11 DIAGNOSIS — M25561 Pain in right knee: Secondary | ICD-10-CM | POA: Diagnosis not present

## 2022-07-11 MED ORDER — IBUPROFEN 400 MG PO TABS
600.0000 mg | ORAL_TABLET | Freq: Once | ORAL | Status: AC
  Administered 2022-07-11: 600 mg via ORAL
  Filled 2022-07-11: qty 1

## 2022-07-11 NOTE — ED Provider Notes (Signed)
[facility] EMERGENCY DEPT Provider Note   CSN: [id number] Arrival date & time: 07/11/22  1339     History  Chief Complaint  Patient presents with   Knee Pain   Fall   HPI Stacey Palmer is a 41 y.o. female presenting for right knee pain after a fall.  States that this occurred 1 hour ago.  She works for [employer] here at our [place].  She was cleaning the bathroom, the floor was slippery.  She then slipped and fell and landed directly on the front part of her right knee.  Denies head injury or loss of consciousness.  Patient states that knee is only mildly painful and somewhat sore.  She was reluctant to come be evaluated but her supervisor and coworker urged her to at least be "checked out".   Knee Pain Fall       Home Medications Prior to Admission medications   Medication Sig Start Date End Date Taking? Authorizing Provider  azelastine (OPTIVAR) 0.05 % ophthalmic solution Apply 1 drop to eye 2 (two) times daily. 04/03/22   Tollie Eth, NP  Cholecalciferol (VITAMIN D3 PO) Take 1 capsule by mouth once a week.    [provider]  fluticasone (FLONASE) 50 MCG/ACT nasal spray Place 2 sprays into both nostrils daily. 07/07/21   Jannifer Rodney A, FNP  gabapentin (NEURONTIN) 100 MG capsule Take 1 - 3 capsules by mouth at bedtime. 06/23/22   Tollie Eth, NP  ipratropium (ATROVENT) 0.03 % nasal spray Place 2 sprays into both nostrils 2 (two) times daily. 04/03/22   Tollie Eth, NP  levocetirizine (XYZAL) 5 MG tablet Take 1 tablet (5 mg total) by mouth every evening. 06/25/22   Early, Sung Amabile, NP  sertraline (ZOLOFT) 50 MG tablet Take 1 tablet by mouth daily. 06/23/22   Tollie Eth, NP      Allergies    Patient has no known allergies.    Review of Systems   Review of Systems  Musculoskeletal:        Right knee pain    Physical Exam Updated Vital Signs BP 128/77 (BP Location: Left Arm)   Pulse 65   Temp 97.9 F (36.6 C)   Resp 14   Ht 5\' 7"   (1.702 m)   Wt 97.5 kg   LMP 06/11/2022 (Approximate)   SpO2 100%   BMI 33.67 kg/m  Physical Exam Constitutional:      Appearance: Normal appearance.  HENT:     Head: Normocephalic.     Nose: Nose normal.  Eyes:     Conjunctiva/sclera: Conjunctivae normal.  Pulmonary:     Effort: Pulmonary effort is normal.  Musculoskeletal:     Right upper leg: Normal.     Left upper leg: Normal.     Right knee: Normal. No swelling or deformity. Normal range of motion.     Left knee: Normal.     Right lower leg: Normal. No swelling.     Left lower leg: Normal. No swelling.     Right ankle: Normal.     Left ankle: Normal.     Comments: Gait also normal with no evidence of limp.  Neurological:     Mental Status: She is alert.  Psychiatric:        Mood and Affect: Mood normal.     ED Results / Procedures / Treatments   Labs (all labs ordered are listed, but only abnormal results are displayed) Labs Reviewed - No  data to display  EKG None  Radiology DG Knee Complete 4 Views Right  Result Date: 07/11/2022 CLINICAL DATA:  Fall yesterday with right knee pain. EXAM: RIGHT KNEE - COMPLETE 4+ VIEW COMPARISON:  None Available. FINDINGS: No evidence of fracture, dislocation, or joint effusion. No evidence of arthropathy or other focal bone abnormality. Soft tissues are unremarkable. IMPRESSION: Negative. Electronically Signed   By: Elberta Fortis M.D.   On: 07/11/2022 15:55    Procedures Procedures    Medications Ordered in ED Medications  ibuprofen (ADVIL) tablet 600 mg (600 mg Oral Given 07/11/22 1533)    ED Course/ Medical Decision Making/ A&P                           Medical Decision Making Amount and/or Complexity of Data Reviewed Radiology: ordered.   Patient presented for right knee pain related to fall.  Physical exam of the right knee did not reveal swelling, concern for effusion.  Range of motion, strength, sensation was intact in both legs fluid in the knee joints.  Gait  was normal without limp.  X-ray did not reveal concern for location or fracture or new joint effusion.  Recommended Tylenol ibuprofen for pain as needed.  Advised to follow-up with PCP.  Discussed return precautions.        Final Clinical Impression(s) / ED Diagnoses Final diagnoses:  Acute pain of right knee    Rx / DC Orders ED Discharge Orders     None         Gareth Eagle, PA-C 07/11/22 1603    Benjiman Core, MD 07/14/22 1447

## 2022-07-11 NOTE — Discharge Instructions (Addendum)
Diagnosed with right knee pain.  Overall work-up was reassuring, x-ray did not reveal concern for dislocation, fracture, new joint effusion.  Recommend Tylenol and ibuprofen for pain as needed.  If you start to experience any swelling and pain tomorrow recommend rest, ice, compression, and elevation of that right knee as needed.  Would also recommend follow-up with PCP.  If you start to experience swelling of the right leg, with new calf tenderness, shortness of breath and chest pain please return to the emergency department for further evaluation.

## 2022-07-11 NOTE — ED Triage Notes (Addendum)
Fell  on wet floor at the pool and hurt rt knee and side today. Works here at [employer] did not hit head and NO LOC

## 2022-07-16 ENCOUNTER — Telehealth: Payer: Self-pay | Admitting: Neurology

## 2022-07-16 NOTE — Telephone Encounter (Signed)
Patient wants to know if patel got the results back from [facility]. Wants to make sure she got everything she needs

## 2022-07-17 ENCOUNTER — Encounter: Payer: Self-pay | Admitting: Podiatry

## 2022-07-17 ENCOUNTER — Ambulatory Visit: Payer: 59 | Admitting: Podiatry

## 2022-07-17 DIAGNOSIS — A499 Bacterial infection, unspecified: Secondary | ICD-10-CM | POA: Diagnosis not present

## 2022-07-17 DIAGNOSIS — L608 Other nail disorders: Secondary | ICD-10-CM | POA: Diagnosis not present

## 2022-07-17 DIAGNOSIS — G609 Hereditary and idiopathic neuropathy, unspecified: Secondary | ICD-10-CM

## 2022-07-17 NOTE — Telephone Encounter (Signed)
EMG from [facility] was received.

## 2022-07-18 ENCOUNTER — Encounter (HOSPITAL_BASED_OUTPATIENT_CLINIC_OR_DEPARTMENT_OTHER): Payer: Self-pay | Admitting: Nurse Practitioner

## 2022-07-18 ENCOUNTER — Ambulatory Visit (INDEPENDENT_AMBULATORY_CARE_PROVIDER_SITE_OTHER): Payer: 59 | Admitting: Nurse Practitioner

## 2022-07-18 ENCOUNTER — Other Ambulatory Visit (HOSPITAL_BASED_OUTPATIENT_CLINIC_OR_DEPARTMENT_OTHER): Payer: Self-pay

## 2022-07-18 VITALS — BP 125/88 | HR 64 | Ht 67.0 in | Wt 264.9 lb

## 2022-07-18 DIAGNOSIS — M25551 Pain in right hip: Secondary | ICD-10-CM | POA: Diagnosis not present

## 2022-07-18 DIAGNOSIS — Z23 Encounter for immunization: Secondary | ICD-10-CM

## 2022-07-18 DIAGNOSIS — M545 Low back pain, unspecified: Secondary | ICD-10-CM | POA: Diagnosis not present

## 2022-07-18 DIAGNOSIS — Z20822 Contact with and (suspected) exposure to covid-19: Secondary | ICD-10-CM

## 2022-07-18 DIAGNOSIS — Z6841 Body Mass Index (BMI) 40.0 and over, adult: Secondary | ICD-10-CM | POA: Diagnosis not present

## 2022-07-18 DIAGNOSIS — G629 Polyneuropathy, unspecified: Secondary | ICD-10-CM | POA: Diagnosis not present

## 2022-07-18 DIAGNOSIS — J3089 Other allergic rhinitis: Secondary | ICD-10-CM

## 2022-07-18 DIAGNOSIS — F3341 Major depressive disorder, recurrent, in partial remission: Secondary | ICD-10-CM

## 2022-07-18 DIAGNOSIS — R69 Illness, unspecified: Secondary | ICD-10-CM | POA: Diagnosis not present

## 2022-07-18 MED ORDER — SERTRALINE HCL 50 MG PO TABS
50.0000 mg | ORAL_TABLET | Freq: Every day | ORAL | 3 refills | Status: DC
  Filled 2022-07-18 – 2022-08-12 (×2): qty 30, 30d supply, fill #0

## 2022-07-18 MED ORDER — LEVOCETIRIZINE DIHYDROCHLORIDE 5 MG PO TABS
5.0000 mg | ORAL_TABLET | Freq: Every evening | ORAL | 3 refills | Status: DC
  Filled 2022-07-18 – 2022-08-12 (×2): qty 30, 30d supply, fill #0

## 2022-07-18 MED ORDER — GABAPENTIN 100 MG PO CAPS
100.0000 mg | ORAL_CAPSULE | Freq: Every day | ORAL | 3 refills | Status: DC
  Filled 2022-07-18: qty 6, 2d supply, fill #0
  Filled 2022-07-18: qty 84, 28d supply, fill #0
  Filled 2022-08-12: qty 60, 20d supply, fill #0

## 2022-07-18 MED ORDER — FLUTICASONE PROPIONATE 50 MCG/ACT NA SUSP
2.0000 | Freq: Every day | NASAL | 6 refills | Status: DC
  Filled 2022-07-18 – 2022-08-19 (×3): qty 16, 30d supply, fill #0

## 2022-07-18 NOTE — Progress Notes (Signed)
Shawna Clamp, DNP, AGNP-c [facility] [address] [place], [place] [address] [phone] Office [phone] Fax  ESTABLISHED PATIENT- Chronic Health and/or Follow-Up Visit  Blood pressure 125/88, pulse 64, height 5\' 7"  (1.702 m), weight 264 lb 14.4 oz (120.2 kg), SpO2 99 %.    Stacey Palmer is a 41 y.o. year old female presenting today for evaluation and management of the following: Follow-up (Patient presents for follow up and neuropathy and weight)   There are no diagnoses linked to this encounter. Neuropathy She is still having neuropathic pain in right leg. She would like to know if there is anything that she can take for this.   Weight  She is working on exercises at home with weights and increased movements. She is interested in a trial of [employer] to help with her efforts.  She is struggling to know what is good to eat and what isn't   All ROS negative with exception of what is listed above.   PHYSICAL EXAM Physical Exam Vitals and nursing note reviewed.  Constitutional:      General: She is not in acute distress.    Appearance: Normal appearance.  HENT:     Head: Normocephalic.  Eyes:     Extraocular Movements: Extraocular movements intact.     Conjunctiva/sclera: Conjunctivae normal.     Pupils: Pupils are equal, round, and reactive to light.  Neck:     Vascular: No carotid bruit.  Cardiovascular:     Rate and Rhythm: Normal rate and regular rhythm.     Pulses: Normal pulses.     Heart sounds: Normal heart sounds. No murmur heard. Pulmonary:     Effort: Pulmonary effort is normal.     Breath sounds: Normal breath sounds. No wheezing.  Abdominal:     General: Bowel sounds are normal. There is no distension.     Palpations: Abdomen is soft.     Tenderness: There is no abdominal tenderness. There is no guarding.  Musculoskeletal:        General: Normal range of motion.     Cervical back: Normal range of  motion and neck supple.     Right lower leg: No edema.     Left lower leg: No edema.  Lymphadenopathy:     Cervical: No cervical adenopathy.  Skin:    General: Skin is warm and dry.     Capillary Refill: Capillary refill takes less than 2 seconds.  Neurological:     General: No focal deficit present.     Mental Status: She is alert and oriented to person, place, and time.  Psychiatric:        Mood and Affect: Mood normal.        Behavior: Behavior normal.        Thought Content: Thought content normal.        Judgment: Judgment normal.     PLAN Problem List Items Addressed This Visit     Morbid obesity with BMI of 40.0-44.9, adult (HCC)    Chronic.  Discussion today about diet and exercise.  Printouts provided with dietary guidelines and recommendations for daily meals.  We will also send referral to [facility] well so that she can have the 2-week trial to see if this is helpful for her.  We will plan to follow-up in 3 months or sooner if needed.      Relevant Orders   Ambulatory referral to [facility]   Neuropathy -  Primary    Chronic neuropathic symptoms not well controlled at this time.  We will send gabapentin for nighttime monitoring.  Recommend follow-up if symptoms worsen or fail to improve.      Relevant Medications   gabapentin (NEURONTIN) 100 MG capsule   Other Relevant Orders   Ambulatory referral to [facility]   Depression   Relevant Medications   sertraline (ZOLOFT) 50 MG tablet   Environmental and seasonal allergies   Relevant Medications   levocetirizine (XYZAL) 5 MG tablet   Hip pain, right   Relevant Orders   Ambulatory referral to [facility]   Low back pain   Relevant Orders   Ambulatory referral to [facility]   Other Visit Diagnoses     Encounter by telehealth for suspected COVID-19       Relevant Medications   fluticasone (FLONASE) 50 MCG/ACT nasal spray   Flu  vaccine need       Relevant Orders   Flu Vaccine QUAD 6+ mos PF IM (Fluarix Quad PF) (Completed)       Return in about 3 months (around 10/17/2022) for weight.   Shawna Clamp, DNP, AGNP-c 07/18/2022  8:59 AM

## 2022-07-18 NOTE — Patient Instructions (Addendum)
Walk 10 minutes a day  Portion out your snacks   Watch your carbohydrates  Drink water

## 2022-07-20 ENCOUNTER — Other Ambulatory Visit: Payer: Self-pay

## 2022-07-20 ENCOUNTER — Emergency Department (HOSPITAL_BASED_OUTPATIENT_CLINIC_OR_DEPARTMENT_OTHER)
Admission: EM | Admit: 2022-07-20 | Discharge: 2022-07-20 | Disposition: A | Discharge status: Home / Self Care | Payer: 59 | Attending: Emergency Medicine | Admitting: Emergency Medicine

## 2022-07-20 DIAGNOSIS — W5501XA Bitten by cat, initial encounter: Secondary | ICD-10-CM | POA: Diagnosis not present

## 2022-07-20 DIAGNOSIS — Z203 Contact with and (suspected) exposure to rabies: Secondary | ICD-10-CM | POA: Diagnosis not present

## 2022-07-20 DIAGNOSIS — Z23 Encounter for immunization: Secondary | ICD-10-CM | POA: Diagnosis not present

## 2022-07-20 DIAGNOSIS — Z2914 Encounter for prophylactic rabies immune globin: Secondary | ICD-10-CM | POA: Insufficient documentation

## 2022-07-20 DIAGNOSIS — S61451A Open bite of right hand, initial encounter: Secondary | ICD-10-CM | POA: Diagnosis not present

## 2022-07-20 DIAGNOSIS — S6991XA Unspecified injury of right wrist, hand and finger(s), initial encounter: Secondary | ICD-10-CM | POA: Diagnosis not present

## 2022-07-20 MED ORDER — RABIES IMMUNE GLOBULIN 150 UNIT/ML IM INJ
20.0000 [IU]/kg | INJECTION | Freq: Once | INTRAMUSCULAR | Status: AC
  Administered 2022-07-20: 2400 [IU] via INTRAMUSCULAR
  Filled 2022-07-20: qty 16

## 2022-07-20 MED ORDER — RABIES VACCINE, PCEC IM SUSR
1.0000 mL | Freq: Once | INTRAMUSCULAR | Status: AC
  Administered 2022-07-20: 1 mL via INTRAMUSCULAR
  Filled 2022-07-20: qty 1

## 2022-07-20 MED ORDER — TETANUS-DIPHTH-ACELL PERTUSSIS 5-2.5-18.5 LF-MCG/0.5 IM SUSY
0.5000 mL | PREFILLED_SYRINGE | Freq: Once | INTRAMUSCULAR | Status: AC
  Administered 2022-07-20: 0.5 mL via INTRAMUSCULAR
  Filled 2022-07-20: qty 0.5

## 2022-07-20 MED ORDER — AMOXICILLIN-POT CLAVULANATE 875-125 MG PO TABS: 1.0000 | ORAL_TABLET | Freq: Two times a day (BID) | ORAL | 0 refills | Status: AC

## 2022-07-20 NOTE — ED Provider Notes (Signed)
[place] EMERGENCY DEPT Provider Note   CSN: [id number] Arrival date & time: 07/20/22  1101     History  Chief Complaint  Patient presents with   Animal Bite    Stacey Palmer is a 41 y.o. female presenting for evaluation of right hand pain after a cat bite.  Patient states earlier today she was bit by a feral cat on her right wrist.  She reports acute onset pain.  She has a small scrape that was bleeding, bleeding is now controlled.  Tdap is not up-to-date.  She does not know the Rabies Status, and that the cat is feral she does not know if animal control will be able to capture it.  She is not immunocompromise, not on anticoagulation.  HPI     Home Medications Prior to Admission medications   Medication Sig Start Date End Date Taking? Authorizing Provider  amoxicillin-clavulanate (AUGMENTIN) 875-125 MG tablet Take 1 tablet by mouth every 12 (twelve) hours for 10 days. 07/20/22 07/30/22 Yes , , PA-C  azelastine (OPTIVAR) 0.05 % ophthalmic solution Apply 1 drop to eye 2 (two) times daily. 04/03/22   Orma Render, NP  Cholecalciferol (VITAMIN D3 PO) Take 1 capsule by mouth once a week.    [provider]  fluticasone (FLONASE) 50 MCG/ACT nasal spray Place 2 sprays into both nostrils daily. 07/18/22   Orma Render, NP  gabapentin (NEURONTIN) 100 MG capsule Take 1-3 capsules (100-300 mg total) by mouth at bedtime. 07/18/22   Orma Render, NP  ipratropium (ATROVENT) 0.03 % nasal spray Place 2 sprays into both nostrils 2 (two) times daily. 04/03/22   Orma Render, NP  levocetirizine (XYZAL) 5 MG tablet Take 1 tablet (5 mg total) by mouth every evening. 07/18/22   Orma Render, NP  sertraline (ZOLOFT) 50 MG tablet Take 1 tablet by mouth daily. 07/18/22   Orma Render, NP      Allergies    Patient has no known allergies.    Review of Systems   Review of Systems  Musculoskeletal:  Positive for arthralgias.  Skin:  Positive for wound.    Physical  Exam Updated Vital Signs BP (!) 154/96   Pulse 65   Temp 97.6 F (36.4 C)   Ht 5\' 6"  (1.676 m)   Wt 120 kg   LMP 07/15/2022   SpO2 100%   BMI 42.70 kg/m  Physical Exam Vitals and nursing note reviewed.  Constitutional:      General: She is not in acute distress.    Appearance: Normal appearance. She is well-developed. She is obese.  HENT:     Head: Normocephalic and atraumatic.  Eyes:     Extraocular Movements: Extraocular movements intact.  Cardiovascular:     Rate and Rhythm: Normal rate.  Pulmonary:     Effort: Pulmonary effort is normal.  Abdominal:     General: There is no distension.  Musculoskeletal:        General: Swelling and tenderness present. Normal range of motion.     Cervical back: Normal range of motion.     Comments: Small contusion with less than 1 cm superficial laceration of the right wrist overlying the scaphoid.  Full active range of motion of the wrist and the thumb with soreness.  But other deformity noted.  Good distal sensation and cap refill.  Skin:    General: Skin is warm.     Capillary Refill: Capillary refill takes less than 2 seconds.  Findings: No rash.  Neurological:     Mental Status: She is alert and oriented to person, place, and time.     ED Results / Procedures / Treatments   Labs (all labs ordered are listed, but only abnormal results are displayed) Labs Reviewed - No data to display  EKG None  Radiology No results found.  Procedures Procedures    Medications Ordered in ED Medications  rabies immune globulin (HYPERRAB/KEDRAB) injection 2,400 Units (has no administration in time range)  rabies vaccine (RABAVERT) injection 1 mL (has no administration in time range)  Tdap (BOOSTRIX) injection 0.5 mL (0.5 mLs Intramuscular Given 07/20/22 1350)    ED Course/ Medical Decision Making/ A&P                           Medical Decision Making Risk Prescription drug management.    This patient presents to the ED for  concern of r wrist pain after a cat bit. This involves a number of treatment options, and is a complaint that carries with it a moderate risk of complications and morbidity.  The differential diagnosis includes infection, contusion, laceration, bony injury.   Co morbidities:  none  Medicines ordered:  I ordered medication including Tdap and rabies vaccine.     Disposition:  After consideration of the diagnostic results and the patients response to treatment, I feel that the patent would benefit from outpatient management.  Antibiotics for infection.  Discussed pain control with NSAIDs and Tylenol and ice.  Discussed follow-up for remainder of vaccine series versus animal control finding the cat and quarantining it. At this time, patient appears safe for discharge.  Return precautions given.  Patient states she understands and agrees to plan.          Final Clinical Impression(s) / ED Diagnoses Final diagnoses:  Cat bite of right hand, initial encounter    Rx / DC Orders ED Discharge Orders          Ordered    amoxicillin-clavulanate (AUGMENTIN) 875-125 MG tablet  Every 12 hours        07/20/22 1336              , , PA-C 07/20/22 1354    Regan Lemming, MD 07/20/22 1517

## 2022-07-20 NOTE — ED Triage Notes (Signed)
Pt presents with c/o being bit by small cat this morning. Pt reports the cat is a stray that she has been feeding. Small open area to radial side of right wrist, no bleeding at this time

## 2022-07-20 NOTE — ED Notes (Signed)
Patient shows no signs of reaction after injections.  Tolerated well  Reviewed follow up schedule for repeat vaccinations

## 2022-07-20 NOTE — ED Notes (Signed)
Right wrist wound cleans.  No bleeding noted  Wound appears closed

## 2022-07-20 NOTE — Discharge Instructions (Signed)
Take ibuprofen 3 times a day with meals. Take 3 tablets (600 mg) at a time. Do not take other anti-inflammatories at the same time (Advil, Motrin, naproxen, Aleve). You may supplement with Tylenol if you need further pain control. Use ice packs as needed for pain. Take the antibiotics as prescribed. Take the entire course, even if symptoms improve Keep the wound clean- wash two times a day with soap and water Return to the ER if you develop fever, pus draining from the wound, or with any new, worsening, or concerning symptoms.

## 2022-07-21 ENCOUNTER — Other Ambulatory Visit (HOSPITAL_COMMUNITY): Payer: Self-pay

## 2022-07-21 MED ORDER — AMOXICILLIN-POT CLAVULANATE 875-125 MG PO TABS
1.0000 | ORAL_TABLET | Freq: Two times a day (BID) | ORAL | 0 refills | Status: DC
  Filled 2022-07-21: qty 20, 10d supply, fill #0

## 2022-07-21 NOTE — Progress Notes (Signed)
  Subjective:  Patient ID: Stacey Palmer, female    DOB: 11/02/1981,  MRN: [id number]  Chief Complaint  Patient presents with   Tendonitis    Discuss return to work - has appointment with Neurologist next month    41 y.o. female returns with the above complaint. History confirmed with patient.   It feels about the same she has had some neurodiagnostic testing, she has further testing scheduled with Dr. Posey Pronto  Objective:  Physical Exam: warm, good capillary refill, no trophic changes or ulcerative lesions, normal DP and PT pulses and normal sensory exam.  Today she has pain on palpation and percussion to the tibial nerve in the tarsal tunnel as well as compression of the lateral plantar nerve at the heel left is worse than right  Radiographs: X-ray of both feet: no fracture, dislocation, swelling or degenerative changes noted, hallux valgus deformity and pes planus  Study Result  Narrative & Impression  CLINICAL DATA:  Heel pain and burning since March 2022.   EXAM: MR OF THE LEFT HEEL WITHOUT CONTRAST   TECHNIQUE: Multiplanar, multisequence MR imaging of the right heel was performed. No intravenous contrast was administered.   COMPARISON:  None.   FINDINGS: TENDONS   Peroneal: Mild tendinosis and tenosynovitis of the peroneus longus. Peroneal brevis intact.   Posteromedial: Posterior tibial tendon intact. Flexor hallucis longus tendon intact. Flexor digitorum longus tendon intact.   Anterior: Tibialis anterior tendon intact. Extensor hallucis longus tendon intact Extensor digitorum longus tendon intact.   Achilles:  Intact.   Plantar Fascia: Plantar fascia is intact. Mild muscle edema in the abductor hallucis muscle and flexor digitorum brevis muscle concerning for muscle strain.   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 Calcaneofibular ligament intact. Posterior talofibular ligament intact. Anterior and posterior  tibiofibular ligaments intact.   Medial: Chronic partial tear of the deltoid ligament. Spring ligament intact.   CARTILAGE   Ankle Joint: No joint effusion. Partial-thickness cartilage loss of the talofibular joint.   Subtalar Joints/Sinus Tarsi: Normal subtalar joints. No subtalar joint effusion. Normal sinus tarsi.   Bones: No acute fracture or dislocation. Relative pes planus. Small plantar calcaneal spur. Subcortical reactive marrow changes in the medial malleolus adjacent to the deltoid ligament insertion through   Soft Tissue: No fluid collection or hematoma. Mild muscle edema in the abductor hallucis muscle and flexor digitorum brevis muscle concerning for muscle strain. Tarsal tunnel is normal.   IMPRESSION: 1. Mild tendinosis and tenosynovitis of the peroneus longus. 2. Mild muscle edema in the abductor hallucis muscle and flexor digitorum brevis muscle concerning for muscle strain.     Electronically Signed   By: Kathreen Devoid M.D.   On: 09/02/2021 07:17   Assessment:   1. Idiopathic neuropathy      Plan:  Patient was evaluated and treated and all questions answered.  So far still is unclear what the exact source of her neuropathic pain is, still does not appear to be consistent with a classic tarsal tunnel syndrome, she has had to have neurodiagnostic testing and has new testing ordered.  She will continue to follow with neurology for this.  I do not think there is anything else that I can also offer her for her foot pain itself from a foot and ankle standpoint.  She would like to return to work at this point and she may do so  No follow-ups on file.

## 2022-07-23 ENCOUNTER — Ambulatory Visit
Admission: RE | Admit: 2022-07-23 | Discharge: 2022-07-23 | Disposition: A | Discharge status: Home / Self Care | Payer: 59 | Source: Ambulatory Visit | Attending: Nurse Practitioner | Admitting: Nurse Practitioner

## 2022-07-23 DIAGNOSIS — Z203 Contact with and (suspected) exposure to rabies: Secondary | ICD-10-CM

## 2022-07-23 MED ORDER — RABIES VACCINE, PCEC IM SUSR
1.0000 mL | Freq: Once | INTRAMUSCULAR | Status: AC
  Administered 2022-07-23: 1 mL via INTRAMUSCULAR

## 2022-07-23 NOTE — ED Triage Notes (Signed)
Pt presents for 2nd rabies vaccine  

## 2022-07-24 ENCOUNTER — Telehealth: Payer: Self-pay | Admitting: *Deleted

## 2022-07-24 NOTE — Telephone Encounter (Signed)
Patient is requesting test results from 04/09/22. Please advise.

## 2022-07-28 ENCOUNTER — Ambulatory Visit (HOSPITAL_COMMUNITY)
Admission: RE | Admit: 2022-07-28 | Discharge: 2022-07-28 | Disposition: A | Discharge status: Home / Self Care | Payer: 59 | Source: Ambulatory Visit | Attending: Internal Medicine | Admitting: Internal Medicine

## 2022-07-28 DIAGNOSIS — Z203 Contact with and (suspected) exposure to rabies: Secondary | ICD-10-CM

## 2022-07-28 MED ORDER — RABIES VACCINE, PCEC IM SUSR
1.0000 mL | Freq: Once | INTRAMUSCULAR | Status: AC
  Administered 2022-07-28: 1 mL via INTRAMUSCULAR

## 2022-07-28 MED ORDER — RABIES VACCINE, PCEC IM SUSR
INTRAMUSCULAR | Status: AC
  Filled 2022-07-28: qty 1

## 2022-07-28 NOTE — ED Triage Notes (Signed)
Pt presents for 3rd rabies vaccine. Denies any other complaints. 

## 2022-07-28 NOTE — ED Notes (Signed)
Pt presents for 3rd rabies vaccine. Denies any other complaints.

## 2022-07-31 ENCOUNTER — Other Ambulatory Visit (HOSPITAL_BASED_OUTPATIENT_CLINIC_OR_DEPARTMENT_OTHER): Payer: Self-pay

## 2022-07-31 NOTE — Telephone Encounter (Signed)
Called patient, no answer,let message to call back for results.

## 2022-08-01 ENCOUNTER — Telehealth: Payer: Self-pay | Admitting: *Deleted

## 2022-08-01 NOTE — Telephone Encounter (Signed)
Bako results are in 35 folder, ready to review.

## 2022-08-01 NOTE — Telephone Encounter (Signed)
Called patient again to go over her test results, no answer, left voice message for call back.

## 2022-08-01 NOTE — Telephone Encounter (Signed)
Patient is calling for the results of bako lab results from 07/17/22. The patient said that her nails were cut and was supposed to be sent to the lab for culture. Not seeing anything in epic notes.  [facility], have order and will fax over-08/01/22.

## 2022-08-02 NOTE — Assessment & Plan Note (Signed)
Chronic neuropathic symptoms not well controlled at this time.  We will send gabapentin for nighttime monitoring.  Recommend follow-up if symptoms worsen or fail to improve.

## 2022-08-02 NOTE — Assessment & Plan Note (Signed)
Chronic.  Discussion today about diet and exercise.  Printouts provided with dietary guidelines and recommendations for daily meals.  We will also send referral to [facility] well so that she can have the 2-week trial to see if this is helpful for her.  We will plan to follow-up in 3 months or sooner if needed.

## 2022-08-03 ENCOUNTER — Ambulatory Visit
Admission: RE | Admit: 2022-08-03 | Discharge: 2022-08-03 | Disposition: A | Discharge status: Home / Self Care | Payer: 59 | Source: Ambulatory Visit | Attending: Emergency Medicine | Admitting: Emergency Medicine

## 2022-08-03 DIAGNOSIS — Z203 Contact with and (suspected) exposure to rabies: Secondary | ICD-10-CM

## 2022-08-03 MED ORDER — RABIES VACCINE, PCEC IM SUSR
1.0000 mL | Freq: Once | INTRAMUSCULAR | Status: AC
  Administered 2022-08-03: 1 mL via INTRAMUSCULAR

## 2022-08-03 NOTE — ED Triage Notes (Signed)
Here for final rabies vaccine

## 2022-08-05 ENCOUNTER — Encounter: Payer: Self-pay | Admitting: Podiatry

## 2022-08-05 NOTE — Telephone Encounter (Signed)
Mychart message sent with results.

## 2022-08-06 ENCOUNTER — Encounter: Payer: Self-pay | Admitting: Podiatry

## 2022-08-08 ENCOUNTER — Ambulatory Visit
Admission: RE | Admit: 2022-08-08 | Discharge: 2022-08-08 | Disposition: A | Discharge status: Home / Self Care | Payer: 59 | Source: Ambulatory Visit | Attending: Family Medicine | Admitting: Family Medicine

## 2022-08-08 VITALS — BP 139/86 | HR 94 | Temp 98.4°F | Resp 20

## 2022-08-08 DIAGNOSIS — U071 COVID-19: Secondary | ICD-10-CM

## 2022-08-08 MED ORDER — PROMETHAZINE-DM 6.25-15 MG/5ML PO SYRP: 5.0000 mL | ORAL_SOLUTION | Freq: Four times a day (QID) | ORAL | 0 refills | Status: DC | PRN

## 2022-08-08 MED ORDER — MOLNUPIRAVIR EUA 200MG CAPSULE: 4.0000 | ORAL_CAPSULE | Freq: Two times a day (BID) | ORAL | 0 refills | Status: AC

## 2022-08-08 NOTE — ED Triage Notes (Signed)
Pt reports cough, chills, sore throat, runny nose and sneezing  x 1 day.   Pt tested positive for COVID today.

## 2022-08-08 NOTE — ED Provider Notes (Signed)
[facility]    CSN: [id number] Arrival date & time: 08/08/22  1638      History   Chief Complaint Chief Complaint  Patient presents with   Cough    + COVID   Appointment    1700    HPI Stacey Palmer is a 41 y.o. female.   Patient presenting today with 1 day history of sneezing, runny nose, cough, sore throat, chills, body aches.  Denies chest pain, shortness of breath, abdominal pain, nausea vomiting or diarrhea.  So far not trying anything over-the-counter for symptoms.  Tested positive for COVID this morning.  No known history of chronic pulmonary disease.    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   pHQ 9 SCORE OF 12 IN 12/2018, score of 0 in 03/2019 on medication   Dry eyes 12/26/2018   Encounter for gynecological examination with Papanicolaou smear of cervix 01/28/2022   Encounter for screening fecal occult blood testing 01/28/2022   Family planning 01/28/2022   Fatigue 12/06/2019   Feeling sad 01/28/2022   H/O gastric sleeve 2018   Hair loss 12/06/2019   History of mammogram 2021   Obesity    Obesity (BMI 30-39.9) 06/11/2015   S/p bariatric surgery in 2017   Plantar fasciitis    Pregnancy examination or test, negative result 01/28/2022   Skin tag 06/05/2014   Sleep apnea    URI (upper respiratory infection) 08/23/2012   Vaginal irritation 09/18/2020   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   Dx 06/2014    Patient Active Problem List   Diagnosis Date Noted   Feeling sad 06/16/2022   Neuropathy 04/03/2022   COVID-19 virus infection 11/30/2020   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) 02/10/2008    Past Surgical History:  Procedure Laterality Date   BARIATRIC SURGERY  2018   CESAREAN SECTION     TUBAL LIGATION      OB History     Gravida  3   Para  3   Term  3   Preterm      AB      Living  3      SAB      IAB      Ectopic      Multiple      Live Births  3            Home Medications    Prior to Admission medications   Medication Sig Start Date End Date Taking? Authorizing Provider  molnupiravir EUA (LAGEVRIO) 200 mg CAPS capsule Take 4 capsules (800 mg total) by mouth 2 (two) times daily for 5 days. 08/08/22 08/13/22 Yes Volney American, PA-C  promethazine-dextromethorphan (PROMETHAZINE-DM) 6.25-15 MG/5ML syrup Take 5 mLs by mouth 4 (four) times daily as needed. 08/08/22  Yes Volney American, PA-C  amoxicillin-clavulanate (AUGMENTIN) 875-125 MG tablet Take 1 tablet by mouth every 12 (twelve)  hours for ten days. 07/20/22     azelastine (OPTIVAR) 0.05 % ophthalmic solution Apply 1 drop to eye 2 (two) times daily. 04/03/22   Tollie Eth, NP  Cholecalciferol (VITAMIN D3 PO) Take 1 capsule by mouth once a week.    [provider]  fluticasone (FLONASE) 50 MCG/ACT nasal spray Place 2 sprays into both nostrils daily. 07/18/22   Tollie Eth, NP  gabapentin (NEURONTIN) 100 MG capsule Take 1-3 capsules (100-300 mg total) by mouth at bedtime. 07/18/22   Tollie Eth, NP  ipratropium (ATROVENT) 0.03 % nasal spray Place 2 sprays into both nostrils 2 (two) times daily. 04/03/22   Tollie Eth, NP  levocetirizine (XYZAL) 5 MG tablet Take 1 tablet (5 mg total) by mouth every evening. 07/18/22   Tollie Eth, NP  sertraline (ZOLOFT) 50 MG tablet Take 1 tablet by mouth daily. 07/18/22   Early, Sung Amabile, NP     Family History Family History  Problem Relation Age of Onset   Hypertension Mother    Kidney disease Father    Diabetes Father    Breast cancer Paternal Aunt    Dementia Paternal Grandmother    Asthma Daughter     Social History Social History   Tobacco Use   Smoking status: Never    Passive exposure: Never   Smokeless tobacco: Never  Vaping Use   Vaping Use: Never used  Substance Use Topics   Alcohol use: No   Drug use: No     Allergies   Patient has no known allergies.   Review of Systems Review of Systems Per HPI  Physical Exam Triage Vital Signs ED Triage Vitals  Enc Vitals Group     BP 08/08/22 1643 139/86     Pulse Rate 08/08/22 1643 94     Resp 08/08/22 1643 20     Temp 08/08/22 1643 98.4 F (36.9 C)     Temp Source 08/08/22 1643 Oral     SpO2 08/08/22 1643 98 %     Weight --      Height --      Head Circumference --      Peak Flow --      Pain Score 08/08/22 1647 8     Pain Loc --      Pain Edu? --      Excl. in GC? --    No data found.  Updated Vital Signs BP 139/86 (BP Location: Right Arm)   Pulse 94   Temp 98.4 F (36.9 C) (Oral)   Resp 20   LMP  (Within Weeks) Comment: 1 week  SpO2 98%   Visual Acuity Right Eye Distance:   Left Eye Distance:   Bilateral Distance:    Right Eye Near:   Left Eye Near:    Bilateral Near:     Physical Exam Vitals and nursing note reviewed.  Constitutional:      Appearance: Normal appearance. She is not ill-appearing.  HENT:     Head: Atraumatic.     Right Ear: Tympanic membrane and external ear normal.     Left Ear: Tympanic membrane and external ear normal.     Nose: Rhinorrhea present.     Mouth/Throat:     Mouth: Mucous membranes are moist.     Pharynx: Posterior oropharyngeal erythema present.  Eyes:     Extraocular Movements: Extraocular movements intact.     Conjunctiva/sclera: Conjunctivae normal.  Cardiovascular:     Rate and Rhythm:  Normal rate and regular rhythm.      Heart sounds: Normal heart sounds.  Pulmonary:     Effort: Pulmonary effort is normal.     Breath sounds: Normal breath sounds. No wheezing.  Musculoskeletal:        General: Normal range of motion.     Cervical back: Normal range of motion and neck supple.  Lymphadenopathy:     Cervical: No cervical adenopathy.  Skin:    General: Skin is warm and dry.  Neurological:     Mental Status: She is alert and oriented to person, place, and time.  Psychiatric:        Mood and Affect: Mood normal.        Thought Content: Thought content normal.        Judgment: Judgment normal.    UC Treatments / Results  Labs (all labs ordered are listed, but only abnormal results are displayed) Labs Reviewed - No data to display  EKG   Radiology No results found.  Procedures Procedures (including critical care time)  Medications Ordered in UC Medications - No data to display  Initial Impression / Assessment and Plan / UC Course  I have reviewed the triage vital signs and the nursing notes.  Pertinent labs & imaging results that were available during my care of the patient were reviewed by me and considered in my medical decision making (see chart for details).     Known to be COVID-positive, overall vital signs and exam reassuring, treat with molnupiravir, Phenergan DM, supportive over-the-counter medications and home care.  Return for worsening symptoms.  Final Clinical Impressions(s) / UC Diagnoses   Final diagnoses:  COVID-19   Discharge Instructions   None    ED Prescriptions     Medication Sig Dispense Auth. Provider   molnupiravir EUA (LAGEVRIO) 200 mg CAPS capsule Take 4 capsules (800 mg total) by mouth 2 (two) times daily for 5 days. 40 capsule Particia Nearing, [place]   promethazine-dextromethorphan (PROMETHAZINE-DM) 6.25-15 MG/5ML syrup Take 5 mLs by mouth 4 (four) times daily as needed. 100 mL Particia Nearing, [place]      PDMP not reviewed this encounter.    Particia Nearing, [place] 08/08/22 1859

## 2022-08-11 ENCOUNTER — Ambulatory Visit: Payer: No Typology Code available for payment source | Admitting: Neurology

## 2022-08-12 ENCOUNTER — Other Ambulatory Visit (HOSPITAL_COMMUNITY): Payer: Self-pay

## 2022-08-12 ENCOUNTER — Encounter: Payer: 59 | Admitting: Neurology

## 2022-08-12 ENCOUNTER — Encounter (HOSPITAL_BASED_OUTPATIENT_CLINIC_OR_DEPARTMENT_OTHER): Payer: Self-pay | Admitting: Nurse Practitioner

## 2022-08-12 ENCOUNTER — Other Ambulatory Visit (HOSPITAL_BASED_OUTPATIENT_CLINIC_OR_DEPARTMENT_OTHER): Payer: Self-pay

## 2022-08-12 ENCOUNTER — Ambulatory Visit (INDEPENDENT_AMBULATORY_CARE_PROVIDER_SITE_OTHER): Payer: 59 | Admitting: Nurse Practitioner

## 2022-08-12 DIAGNOSIS — U071 COVID-19: Secondary | ICD-10-CM

## 2022-08-12 MED ORDER — GUAIFENESIN ER 600 MG PO TB12
1200.0000 mg | ORAL_TABLET | Freq: Two times a day (BID) | ORAL | 2 refills | Status: DC | PRN
  Filled 2022-08-12: qty 30, 8d supply, fill #0

## 2022-08-12 MED ORDER — IBUPROFEN 600 MG PO TABS
600.0000 mg | ORAL_TABLET | Freq: Four times a day (QID) | ORAL | 0 refills | Status: DC | PRN
  Filled 2022-08-12: qty 30, 8d supply, fill #0

## 2022-08-12 MED ORDER — HYDROCODONE BIT-HOMATROP MBR 5-1.5 MG/5ML PO SOLN
5.0000 mL | Freq: Three times a day (TID) | ORAL | 0 refills | Status: DC | PRN
  Filled 2022-08-12: qty 105, 7d supply, fill #0

## 2022-08-12 MED ORDER — ALBUTEROL SULFATE HFA 108 (90 BASE) MCG/ACT IN AERS
2.0000 | INHALATION_SPRAY | Freq: Four times a day (QID) | RESPIRATORY_TRACT | 11 refills | Status: DC | PRN
  Filled 2022-08-12: qty 6.7, 25d supply, fill #0

## 2022-08-12 NOTE — Progress Notes (Signed)
Virtual Visit Encounter telephone visit.   I connected with  [name] on 08/12/22 at 10:30 AM EDT by secure audio telemedicine application. I verified that I am speaking with the correct person using two identifiers.   I introduced myself as a Designer, jewellery with the practice. The limitations of evaluation and management by telemedicine discussed with the patient and the availability of in person appointments. The patient expressed verbal understanding and consent to proceed.  Participating parties in this visit include: Myself and patient  The patient is: Patient Location: Home I am: Provider Location: Office/Clinic Subjective:    CC and HPI: Stacey Palmer is a 41 y.o. year old female presenting for new evaluation and treatment of COVID-19. Patient reports the following: COVID positive - symptoms started on Thursday of last week with body aches, headache, and sore throat. Symptoms worsened on Friday and she tested for COVID, which was negative. She continued to feel worse and retested and was found to be positive for infection. She was seen in the UC and prescribed Molnupiravir, but she was concerned at the number of pills that needed to be taken and so she did not start this.  She endorses fever, chills, body aches, cough, congestion, headache, ear pain, wheezing, diarrhea, and increased mucous production.  She has been using Tylenol and OTC cough medication with little relief.   Past medical history, Surgical history, Family history not pertinant except as noted below, Social history, Allergies, and medications have been entered into the medical record, reviewed, and corrections made.   Review of Systems:  All review of systems negative except what is listed in the HPI  Objective:    Alert and oriented x 4 Speaking in clear sentences with no shortness of breath. No distress.  Impression and Recommendations:    Problem List Items Addressed This Visit   None   orders and  follow up as documented in EMR I discussed the assessment and treatment plan with the patient. The patient was provided an opportunity to ask questions and all were answered. The patient agreed with the plan and demonstrated an understanding of the instructions.   The patient was advised to call back or seek an in-person evaluation if the symptoms worsen or if the condition fails to improve as anticipated.  Follow-Up: in a few days- phone call  I provided 22 minutes of non-face-to-face interaction with this non face-to-face encounter including intake, same-day documentation, and chart review.   Orma Render, NP , DNP, AGNP-c [facility] [phone] (fax)

## 2022-08-12 NOTE — Patient Instructions (Signed)
Try to stay at least 6 feet away from other people. This will protect the people around you.  Please continue good preventive care measures, including:  frequent hand-washing, avoid touching your face, cover coughs/sneezes, stay out of crowds and keep a 6 foot distance from others.  COVID-19 is a respiratory illness with symptoms that are similar to the flu. Symptoms are typically mild to moderate, but there have been cases of severe illness and death due to the virus.    The following symptoms may appear 2-14 days after exposure: Fever Cough Shortness of breath or difficulty breathing Chills Repeated shaking with chills Muscle pain Headache Sore throat New loss of taste or smell Fatigue Congestion or runny nose Nausea or vomiting Diarrhea   Go to the nearest hospital ED for assessment if fever/cough/breathlessness are severe or illness seems like a threat to life.  It is vitally important that if you feel that you have an infection such as this virus or any other virus that you stay home and away from places where you may spread it to others.  You should avoid contact with people age 84 and older.    You can use medication such as: Acetaminophen Ibuprofen Mucinex Cough Drops Decongestant   Reduce your risk of any infection by using the same precautions used for avoiding the common cold or flu:  Wash your hands often with soap and warm water for at least 20 seconds.  If soap and water are not readily available, use an alcohol-based hand sanitizer with at least 60% alcohol.  If coughing or sneezing, cover your mouth and nose by coughing or sneezing into the elbow areas of your shirt or coat, into a tissue or into your sleeve (not your hands). Avoid shaking hands with others and consider head nods or verbal greetings only. Avoid touching your eyes, nose, or mouth with unwashed hands.  Avoid close contact with people who are sick. Avoid places or events with large numbers of people in  one location, like concerts or sporting events. Carefully consider travel plans you have or are making. If you are planning any travel outside or inside the [place], visit the CDC's Travelers' Health webpage for the latest health notices. If you have some symptoms but not all symptoms, continue to monitor at home and seek medical attention if your symptoms worsen. If you are having a medical emergency, call 911.   HOME CARE Only take medications as instructed by your medical team. Drink plenty of fluids and get plenty of rest. A steam or ultrasonic humidifier can help if you have congestion.    GET HELP RIGHT AWAY IF YOU HAVE EMERGENCY WARNING SIGNS** FOR COVID-19. If you or someone is showing any of these signs seek emergency medical care immediately. Call 911 or proceed to your closest emergency facility if: You develop worsening high fever. Trouble breathing Bluish lips or face Persistent pain or pressure in the chest New confusion Inability to wake or stay awake You cough up blood. Your symptoms become more severe   **This list is not all possible symptoms. Contact your medical provider for any symptoms that are sever or concerning to you.   MAKE SURE YOU  Understand these instructions. Will watch your condition. Will get help right away if you are not doing well or get worse.

## 2022-08-12 NOTE — Assessment & Plan Note (Signed)
Positive COVID-19 viral infection with no alarm symptoms present at this time.  Recommend alternating ibuprofen 600 mg and Tylenol 1000 mg every 4-6 hours as needed for fevers, aches, pains, headache.  We will send in Hycodan cough syrup to help with severe cough and congested symptoms.  Albuterol inhaler provided for wheezing.  Patient encouraged to utilize Mucinex to help with increased mucus production.  Increased hydration and rest are highly recommended.  Work note provided for this week.  We will plan to call patient and check in on Friday with follow-up visit.

## 2022-08-18 ENCOUNTER — Encounter: Payer: Self-pay | Admitting: Emergency Medicine

## 2022-08-18 ENCOUNTER — Ambulatory Visit (INDEPENDENT_AMBULATORY_CARE_PROVIDER_SITE_OTHER): Payer: 59 | Admitting: Nurse Practitioner

## 2022-08-18 ENCOUNTER — Ambulatory Visit: Payer: Self-pay

## 2022-08-18 ENCOUNTER — Ambulatory Visit
Admission: EM | Admit: 2022-08-18 | Discharge: 2022-08-18 | Disposition: A | Discharge status: Home / Self Care | Payer: 59 | Attending: Family Medicine | Admitting: Family Medicine

## 2022-08-18 ENCOUNTER — Other Ambulatory Visit (HOSPITAL_BASED_OUTPATIENT_CLINIC_OR_DEPARTMENT_OTHER): Payer: Self-pay

## 2022-08-18 DIAGNOSIS — J069 Acute upper respiratory infection, unspecified: Secondary | ICD-10-CM | POA: Insufficient documentation

## 2022-08-18 DIAGNOSIS — Z20822 Contact with and (suspected) exposure to covid-19: Secondary | ICD-10-CM | POA: Insufficient documentation

## 2022-08-18 DIAGNOSIS — U071 COVID-19: Secondary | ICD-10-CM

## 2022-08-18 MED ORDER — FLUTICASONE PROPIONATE 50 MCG/ACT NA SUSP: 1.0000 | Freq: Two times a day (BID) | NASAL | 6 refills | Status: DC

## 2022-08-18 MED ORDER — PROMETHAZINE-DM 6.25-15 MG/5ML PO SYRP: 5.0000 mL | ORAL_SOLUTION | Freq: Four times a day (QID) | ORAL | 0 refills | Status: DC | PRN

## 2022-08-18 NOTE — ED Triage Notes (Signed)
Pt reports a cough, headache, chills, runny nose, diarrhea and sneezing. States symptoms have been ongoing since 10/6. Was seen here on 10/6 and did not take a home test.  Requesting a covid test.

## 2022-08-18 NOTE — ED Provider Notes (Signed)
[facility]    CSN: [id number] Arrival date & time: 08/18/22  1446      History   Chief Complaint Chief Complaint  Patient presents with   Headache   Chills   Fatigue   Cough   Diarrhea    HPI Stacey Palmer is a 41 y.o. female.   Patient presenting today with almost a week of cough, headache, chills, body aches, sneezing, nasal congestion, fatigue.  Denies chest pain, shortness of breath, abdominal pain, nausea vomiting or diarrhea.  So far trying over-the-counter cold and congestion medications with minimal relief.  Family members sick with similar symptoms.  Requesting a COVID test today.  No known past history of chronic pulmonary disease.    Past Medical History:  Diagnosis Date   Abdominal cramps 12/16/2020   Allergic rhinitis 06/05/2014   Allergy    Anxiety    Depression, major, single episode, in partial remission ([place]) 12/26/2018   pHQ 9 SCORE OF 12 IN 12/2018, score of 0 in 03/2019 on medication   Dry eyes 12/26/2018   Encounter for gynecological examination with Papanicolaou smear of cervix 01/28/2022   Encounter for screening fecal occult blood testing 01/28/2022   Family planning 01/28/2022   Fatigue 12/06/2019   Feeling sad 01/28/2022   H/O gastric sleeve 2018   Hair loss 12/06/2019   History of mammogram 2021   Obesity    Obesity (BMI 30-39.9) 06/11/2015   S/p bariatric surgery in 2017   Plantar fasciitis    Pregnancy examination or test, negative result 01/28/2022   Skin tag 06/05/2014   Sleep apnea    URI (upper respiratory infection) 08/23/2012   Vaginal irritation 09/18/2020   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   Dx 06/2014    Patient Active Problem List   Diagnosis Date Noted   Feeling sad 06/16/2022   Neuropathy 04/03/2022   COVID-19 11/30/2020   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   Hip pain, right 09/16/2020   Snoring 09/16/2020   Labial skin tag 09/13/2020   Environmental and seasonal allergies 12/06/2019   GAD (generalized anxiety  disorder) 12/26/2018   Chronic dryness of both eyes 12/26/2018   Gastroesophageal reflux disease 06/04/2017   Vitamin D deficiency 10/21/2016   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 Authorizing Provider  promethazine-dextromethorphan (PROMETHAZINE-DM) 6.25-15 MG/5ML syrup Take 5 mLs by mouth 4 (four) times daily as needed. 08/18/22  Yes Volney American, PA-C  albuterol (VENTOLIN HFA) 108 (90 Base) MCG/ACT inhaler Inhale 2 puffs into the lungs every 6 (six) hours as needed for wheezing. 08/12/22   Orma Render, NP  amoxicillin-clavulanate (AUGMENTIN) 875-125 MG tablet  Take 1 tablet by mouth every 12 (twelve) hours for ten days. 07/20/22     azelastine (OPTIVAR) 0.05 % ophthalmic solution Apply 1 drop to eye 2 (two) times daily. 04/03/22   Orma Render, NP  Cholecalciferol (VITAMIN D3 PO) Take 1 capsule by mouth once a week.    [provider]  fluticasone (FLONASE) 50 MCG/ACT nasal spray Place 1 spray into both nostrils 2 (two) times daily. 08/18/22   Volney American, PA-C  gabapentin (NEURONTIN) 100 MG capsule Take 1-3 capsules (100-300 mg total) by mouth at bedtime. 07/18/22   Orma Render, NP  guaiFENesin (MUCINEX) 600 MG 12 hr tablet Take 2 tablets (1,200 mg total) by mouth 2 (two) times daily as needed. 08/12/22   Orma Render, NP  HYDROcodone bit-homatropine (HYCODAN) 5-1.5 MG/5ML syrup Take 5 mLs by mouth every 8 (eight) hours as needed for  cough. 08/12/22   Orma Render, NP  ibuprofen (ADVIL) 600 MG tablet Take 1 tablet (600 mg total) by mouth every 6 (six) hours as needed. 08/12/22   Orma Render, NP  ipratropium (ATROVENT) 0.03 % nasal spray Place 2 sprays into both nostrils 2 (two) times daily. 04/03/22   Orma Render, NP  levocetirizine (XYZAL) 5 MG tablet Take 1 tablet (5 mg total) by mouth every evening. 07/18/22   Orma Render, NP  sertraline (ZOLOFT) 50 MG tablet Take 1 tablet by mouth daily. 07/18/22   Early, Coralee Pesa, NP    Family History Family History  Problem Relation Age of Onset   Hypertension Mother    Kidney disease Father    Diabetes Father    Breast cancer Paternal Aunt    Dementia Paternal Grandmother    Asthma Daughter     Social History Social History   Tobacco Use   Smoking status: Never    Passive exposure: Never   Smokeless tobacco: Never  Vaping Use   Vaping Use: Never used  Substance Use Topics   Alcohol use: No   Drug use: No     Allergies   Patient has no known allergies.   Review of Systems Review of Systems Per HPI  Physical Exam Triage Vital Signs ED Triage Vitals [08/18/22 1459]  Enc Vitals Group     BP 135/88     Pulse Rate 89     Resp 18     Temp 97.6 F (36.4 C)     Temp Source Oral     SpO2 98 %     Weight      Height      Head Circumference      Peak Flow      Pain Score 9     Pain Loc      Pain Edu?      Excl. in [place]?    No data found.  Updated Vital Signs BP 135/88 (BP Location: Right Arm)   Pulse 89   Temp 97.6 F (36.4 C) (Oral)   Resp 18   LMP 07/15/2022   SpO2 99%   Visual Acuity Right Eye Distance:   Left Eye Distance:   Bilateral Distance:    Right Eye Near:   Left Eye Near:    Bilateral Near:     Physical Exam Vitals and nursing note reviewed.  Constitutional:      Appearance: Normal appearance.  HENT:     Head: Atraumatic.     Right Ear: Tympanic membrane and external ear normal.  Left Ear: Tympanic membrane and  external ear normal.     Nose: Rhinorrhea present.     Mouth/Throat:     Mouth: Mucous membranes are moist.     Pharynx: Posterior oropharyngeal erythema present.  Eyes:     Extraocular Movements: Extraocular movements intact.     Conjunctiva/sclera: Conjunctivae normal.  Cardiovascular:     Rate and Rhythm: Normal rate and regular rhythm.     Heart sounds: Normal heart sounds.  Pulmonary:     Effort: Pulmonary effort is normal.     Breath sounds: Normal breath sounds. No wheezing or rales.  Musculoskeletal:        General: Normal range of motion.     Cervical back: Normal range of motion and neck supple.  Skin:    General: Skin is warm and dry.  Neurological:     Mental Status: She is alert and oriented to person, place, and time.  Psychiatric:        Mood and Affect: Mood normal.        Thought Content: Thought content normal.      UC Treatments / Results  Labs (all labs ordered are listed, but only abnormal results are displayed) Labs Reviewed  RESP PANEL BY RT-PCR (FLU A&B, COVID) [id number]    EKG   Radiology No results found.  Procedures Procedures (including critical care time)  Medications Ordered in UC Medications - No data to display  Initial Impression / Assessment and Plan / UC Course  I have reviewed the triage vital signs and the nursing notes.  Pertinent labs & imaging results that were available during my care of the patient were reviewed by me and considered in my medical decision making (see chart for details).     Vital signs and exam overall reassuring and suggestive of a viral upper respiratory infection.  Respiratory panel pending, treat with Phenergan DM, Flonase nasal spray, supportive over-the-counter medications and home care.  Return for any worsening symptoms.  Work note given.  Final Clinical Impressions(s) / UC Diagnoses   Final diagnoses:  Viral URI with cough   Discharge Instructions   None    ED Prescriptions      Medication Sig Dispense Auth. Provider   promethazine-dextromethorphan (PROMETHAZINE-DM) 6.25-15 MG/5ML syrup Take 5 mLs by mouth 4 (four) times daily as needed. 100 mL Volney American, PA-C   fluticasone [facility]) 50 MCG/ACT nasal spray Place 1 spray into both nostrils 2 (two) times daily. 16 g Volney American, [place]      PDMP not reviewed this encounter.   Volney American, [place] 08/18/22 [phone]

## 2022-08-18 NOTE — Progress Notes (Signed)
Patient spoke with CMA at intake. Symptoms resolving slowly. No alarm symptoms of shortness of breath, high fevers, n/v/d present. Recommendations provided for CMA. We will plan follow-up visit if patient endorses new or worsening symptoms.

## 2022-08-19 ENCOUNTER — Other Ambulatory Visit (HOSPITAL_BASED_OUTPATIENT_CLINIC_OR_DEPARTMENT_OTHER): Payer: Self-pay

## 2022-08-19 LAB — RESP PANEL BY RT-PCR (FLU A&B, COVID) ARPGX2
Influenza A by PCR: NEGATIVE
Influenza B by PCR: NEGATIVE
SARS Coronavirus 2 by RT PCR: POSITIVE — AB

## 2022-08-22 ENCOUNTER — Telehealth (HOSPITAL_BASED_OUTPATIENT_CLINIC_OR_DEPARTMENT_OTHER): Payer: Self-pay | Admitting: Nurse Practitioner

## 2022-08-22 NOTE — Telephone Encounter (Signed)
Pt is calling asking about [name] paperwork   Called pt back to advise to send Paperwork over again to [phone]

## 2022-08-28 NOTE — Telephone Encounter (Signed)
Pt is calling again regarding [name]--  Advised we have not received anything

## 2022-09-01 ENCOUNTER — Encounter (HOSPITAL_BASED_OUTPATIENT_CLINIC_OR_DEPARTMENT_OTHER): Payer: Self-pay | Admitting: Nurse Practitioner

## 2022-09-11 ENCOUNTER — Ambulatory Visit: Payer: 59 | Admitting: Neurology

## 2022-09-16 ENCOUNTER — Ambulatory Visit
Admission: EM | Admit: 2022-09-16 | Discharge: 2022-09-16 | Disposition: A | Discharge status: Home / Self Care | Payer: 59 | Attending: Nurse Practitioner | Admitting: Nurse Practitioner

## 2022-09-16 ENCOUNTER — Ambulatory Visit: Payer: Self-pay

## 2022-09-16 ENCOUNTER — Ambulatory Visit: Payer: 59 | Admitting: Podiatry

## 2022-09-16 DIAGNOSIS — N3001 Acute cystitis with hematuria: Secondary | ICD-10-CM | POA: Insufficient documentation

## 2022-09-16 DIAGNOSIS — M545 Low back pain, unspecified: Secondary | ICD-10-CM | POA: Insufficient documentation

## 2022-09-16 LAB — POCT URINALYSIS DIP (MANUAL ENTRY)
Bilirubin, UA: NEGATIVE
Glucose, UA: NEGATIVE mg/dL
Ketones, POC UA: NEGATIVE mg/dL
Nitrite, UA: NEGATIVE
Protein Ur, POC: >=300 mg/dL — AB
Spec Grav, UA: 1.02 (ref 1.010–1.025)
Urobilinogen, UA: 1 E.U./dL
pH, UA: 8.5 — AB (ref 5.0–8.0)

## 2022-09-16 MED ORDER — PHENAZOPYRIDINE HCL 100 MG PO TABS: 100.0000 mg | ORAL_TABLET | Freq: Three times a day (TID) | ORAL | 0 refills | Status: DC | PRN

## 2022-09-16 MED ORDER — CEPHALEXIN 500 MG PO CAPS: 500.0000 mg | ORAL_CAPSULE | Freq: Two times a day (BID) | ORAL | 0 refills | Status: AC

## 2022-09-16 NOTE — ED Triage Notes (Signed)
Pt reports burning when urinating, blood and increase urinary frequency x 1 day.

## 2022-09-16 NOTE — Discharge Instructions (Signed)
The urinalysis today shows blood, white blood cells, which is likely from a urinary tract infection.  Please take the Keflex twice daily for 5 days to treat this.  You can use the Pyridium 100 mg every 8 hours as needed for bladder pain.  This will diarrhea urine bright orange, so do not be alarmed.  Please work on increasing fluid intake with water.  If you develop severe pain, nausea/vomiting and are unable to keep food or fluids down, please go to the emergency room.

## 2022-09-16 NOTE — ED Provider Notes (Signed)
[facility]    CSN: [id number] Arrival date & time: 09/16/22  0840      History   Chief Complaint Chief Complaint  Patient presents with   Appointment    0930   Dysuria    HPI Stacey Palmer is a 41 y.o. female.   Patient presents for 1 day of dysuria, urinary frequency and urgency, voiding smaller amounts, foul urinary odor, hematuria that began this morning, new abdominal and right-sided low back pain, body aches/chills, and nausea without vomiting.  She denies new urinary incontinence, significant vaginal discharge.  Reports that she had a UTI a few years ago, this is not recurrent.  Has not take anything for symptoms so far.    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   pHQ 9 SCORE OF 12 IN 12/2018, score of 0 in 03/2019 on medication   Dry eyes 12/26/2018   Encounter for gynecological examination with Papanicolaou smear of cervix 01/28/2022   Encounter for screening fecal occult blood testing 01/28/2022   Family planning 01/28/2022   Fatigue 12/06/2019   Feeling sad 01/28/2022   H/O gastric sleeve 2018   Hair loss 12/06/2019   History of mammogram 2021   Obesity    Obesity (BMI 30-39.9) 06/11/2015   S/p bariatric surgery in 2017   Plantar fasciitis    Pregnancy examination or test, negative result 01/28/2022   Skin tag 06/05/2014   Sleep apnea    URI (upper respiratory infection) 08/23/2012   Vaginal irritation 09/18/2020   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   Dx 06/2014    Patient Active Problem List   Diagnosis Date Noted   Feeling sad 06/16/2022   Neuropathy 04/03/2022   COVID-19 11/30/2020   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   Hip pain, right 09/16/2020   Snoring 09/16/2020   Labial skin tag 09/13/2020   Environmental and seasonal allergies 12/06/2019   GAD (generalized anxiety disorder) 12/26/2018    Chronic dryness of both eyes 12/26/2018   Gastroesophageal reflux disease 06/04/2017   Vitamin D deficiency 10/21/2016   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 Authorizing Provider  cephALEXin (KEFLEX) 500 MG capsule Take 1 capsule (500 mg total) by mouth 2 (two) times daily for 5 days. 09/16/22 09/21/22 Yes Valentino Nose, NP  phenazopyridine (PYRIDIUM) 100 MG tablet Take 1 tablet (100 mg total) by mouth 3 (three) times daily as needed for pain. 09/16/22  Yes Valentino Nose, NP  albuterol (VENTOLIN HFA)  108 (90 Base) MCG/ACT inhaler Inhale 2 puffs into the lungs every 6 (six) hours as needed for wheezing. 08/12/22   Tollie Eth, NP  azelastine (OPTIVAR) 0.05 % ophthalmic solution Apply 1 drop to eye 2 (two) times daily. 04/03/22   Tollie Eth, NP  Cholecalciferol (VITAMIN D3 PO) Take 1 capsule by mouth once a week.    [provider]  fluticasone (FLONASE) 50 MCG/ACT nasal spray Place 2 sprays into both nostrils daily. 07/18/22   Early, Sung Amabile, NP  fluticasone (FLONASE) 50 MCG/ACT nasal spray Place 1 spray into both nostrils 2 (two) times daily. 08/18/22   Particia Nearing, PA-C  gabapentin (NEURONTIN) 100 MG capsule Take 1-3 capsules (100-300 mg total) by mouth at bedtime. 07/18/22   Tollie Eth, NP  guaiFENesin (MUCINEX) 600 MG 12 hr tablet Take 2 tablets (1,200 mg total) by mouth 2 (two) times daily as needed. 08/12/22   Tollie Eth, NP   HYDROcodone bit-homatropine (HYCODAN) 5-1.5 MG/5ML syrup Take 5 mLs by mouth every 8 (eight) hours as needed for cough. 08/12/22   Tollie Eth, NP  ibuprofen (ADVIL) 600 MG tablet Take 1 tablet (600 mg total) by mouth every 6 (six) hours as needed. 08/12/22   Tollie Eth, NP  ipratropium (ATROVENT) 0.03 % nasal spray Place 2 sprays into both nostrils 2 (two) times daily. 04/03/22   Tollie Eth, NP  levocetirizine (XYZAL) 5 MG tablet Take 1 tablet (5 mg total) by mouth every evening. 07/18/22   Tollie Eth, NP  sertraline (ZOLOFT) 50 MG tablet Take 1 tablet by mouth daily. 07/18/22   Early, Sung Amabile, NP    Family History Family History  Problem Relation Age of Onset   Hypertension Mother    Kidney disease Father    Diabetes Father    Breast cancer Paternal Aunt    Dementia Paternal Grandmother    Asthma Daughter     Social History Social History   Tobacco Use   Smoking status: Never    Passive exposure: Never   Smokeless tobacco: Never  Vaping Use   Vaping Use: Never used  Substance Use Topics   Alcohol use: No   Drug use: No     Allergies   Patient has no known allergies.   Review of Systems Review of Systems Per HPI  Physical Exam Triage Vital Signs ED Triage Vitals  Enc Vitals Group     BP 09/16/22 0938 (!) 141/86     Pulse Rate 09/16/22 0938 60     Resp 09/16/22 0938 18     Temp 09/16/22 0938 97.8 F (36.6 C)     Temp Source 09/16/22 0938 Oral     SpO2 09/16/22 0938 97 %     Weight --      Height --      Head Circumference --      Peak Flow --      Pain Score 09/16/22 0937 0     Pain Loc --      Pain Edu? --      Excl. in [place]? --    No data found.  Updated Vital Signs BP (!) 141/86 (BP Location: Right Wrist)   Pulse 60   Temp 97.8 F (36.6 C) (Oral)   Resp 18   LMP  (Within Weeks) Comment: 1 week  SpO2 97%   Visual Acuity Right Eye Distance:   Left Eye Distance:   Bilateral Distance:  Right Eye Near:   Left Eye Near:    Bilateral  Near:     Physical Exam Vitals and nursing note reviewed.  Constitutional:      General: She is not in acute distress.    Appearance: She is not toxic-appearing.  HENT:     Mouth/Throat:     Mouth: Mucous membranes are moist.     Pharynx: Oropharynx is clear.  Pulmonary:     Effort: Pulmonary effort is normal. No respiratory distress.     Breath sounds: Normal breath sounds. No wheezing, rhonchi or rales.  Abdominal:     General: Abdomen is flat. Bowel sounds are normal. There is no distension.     Palpations: Abdomen is soft. There is no mass.     Tenderness: There is no abdominal tenderness. There is no right CVA tenderness, left CVA tenderness or guarding.  Skin:    General: Skin is warm and dry.     Coloration: Skin is not jaundiced or pale.     Findings: No erythema.  Neurological:     Mental Status: She is alert and oriented to person, place, and time.     Motor: No weakness.     Gait: Gait normal.  Psychiatric:        Behavior: Behavior is cooperative.      UC Treatments / Results  Labs (all labs ordered are listed, but only abnormal results are displayed) Labs Reviewed  POCT URINALYSIS DIP (MANUAL ENTRY) - Abnormal; Notable for the following components:      Result Value   Blood, UA moderate (*)    pH, UA 8.5 (*)    Protein Ur, POC >=300 (*)    Leukocytes, UA Small (1+) (*)    All other components within normal limits  URINE CULTURE    EKG   Radiology No results found.  Procedures Procedures (including critical care time)  Medications Ordered in UC Medications - No data to display  Initial Impression / Assessment and Plan / UC Course  I have reviewed the triage vital signs and the nursing notes.  Pertinent labs & imaging results that were available during my care of the patient were reviewed by me and considered in my medical decision making (see chart for details).   Patient is well-appearing, normotensive, afebrile, not tachycardic, not  tachypneic, oxygenating well on room air.    Acute cystitis with hematuria Urinalysis today shows moderate blood, small leukocytes Given symptoms, suspect acute cystitis and will treat with Keflex twice daily for 5 days Urine culture pending Start Pyridium for bladder pain Note given for work  Acute right-sided low back pain without sciatica Unclear etiology, likely referred pain from UTI versus kidney stone.  Low suspicion for early pyelonephritis Discussed ER precautions at length Vital signs are stable today, overall patient is well-appearing  The patient was given the opportunity to ask questions.  All questions answered to their satisfaction.  The patient is in agreement to this plan.   Final Clinical Impressions(s) / UC Diagnoses   Final diagnoses:  Acute cystitis with hematuria  Acute right-sided low back pain without sciatica   Discharge Instructions   None    ED Prescriptions     Medication Sig Dispense Auth. Provider   cephALEXin (KEFLEX) 500 MG capsule Take 1 capsule (500 mg total) by mouth 2 (two) times daily for 5 days. 10 capsule Cathlean Marseilles A, NP   phenazopyridine (PYRIDIUM) 100 MG tablet Take 1 tablet (100 mg total) by mouth  3 (three) times daily as needed for pain. 10 tablet Valentino NoseMartinez,  A, NP      PDMP not reviewed this encounter.   Valentino NoseMartinez,  A, NP 09/16/22 1003

## 2022-09-17 ENCOUNTER — Other Ambulatory Visit (HOSPITAL_COMMUNITY): Payer: Self-pay

## 2022-09-17 ENCOUNTER — Ambulatory Visit (INDEPENDENT_AMBULATORY_CARE_PROVIDER_SITE_OTHER): Payer: 59 | Admitting: Nurse Practitioner

## 2022-09-17 ENCOUNTER — Encounter (HOSPITAL_BASED_OUTPATIENT_CLINIC_OR_DEPARTMENT_OTHER): Payer: Self-pay | Admitting: Nurse Practitioner

## 2022-09-17 DIAGNOSIS — N3090 Cystitis, unspecified without hematuria: Secondary | ICD-10-CM

## 2022-09-17 MED ORDER — NITROFURANTOIN MONOHYD MACRO 100 MG PO CAPS
100.0000 mg | ORAL_CAPSULE | Freq: Two times a day (BID) | ORAL | 0 refills | Status: DC
  Filled 2022-09-17: qty 10, 5d supply, fill #0

## 2022-09-17 NOTE — Progress Notes (Signed)
Patient presents for screening for UTI. UA shows positive for nitrates with dark coloration. Patient is symptomatic with dysuria and suprapubic pressure.

## 2022-09-18 ENCOUNTER — Telehealth (HOSPITAL_BASED_OUTPATIENT_CLINIC_OR_DEPARTMENT_OTHER): Payer: Self-pay | Admitting: Nurse Practitioner

## 2022-09-18 ENCOUNTER — Encounter: Payer: Self-pay | Admitting: Neurology

## 2022-09-18 ENCOUNTER — Ambulatory Visit (INDEPENDENT_AMBULATORY_CARE_PROVIDER_SITE_OTHER): Payer: 59 | Admitting: Neurology

## 2022-09-18 ENCOUNTER — Other Ambulatory Visit (HOSPITAL_BASED_OUTPATIENT_CLINIC_OR_DEPARTMENT_OTHER): Payer: Self-pay

## 2022-09-18 DIAGNOSIS — M79604 Pain in right leg: Secondary | ICD-10-CM

## 2022-09-18 DIAGNOSIS — R202 Paresthesia of skin: Secondary | ICD-10-CM | POA: Diagnosis not present

## 2022-09-18 DIAGNOSIS — M79605 Pain in left leg: Secondary | ICD-10-CM

## 2022-09-18 NOTE — Telephone Encounter (Signed)
[name] dropped off from [employer]   This is for when the pt had covid 4.  Please advise when the paperwork is complete.  Patient is following  Stacey Palmer to [facility]

## 2022-09-18 NOTE — Procedures (Signed)
[facility]  [address] [place], [address]  [place], [place] [address] Tel: [phone] Fax:  [phone] Test Date:  09/18/2022  Patient: Stacey Palmer DOB: 1981-01-12 Physician: Roxana Hires ,DO  Sex: Female Height: 5\' 6"  Ref Phys: , DO  ID#: Nita Sickle   Technician:    Patient Complaints: This is a 41 year-old female referred for evaluation of bilateral leg and feet pain.  NCV & EMG Findings: Electrodiagnostic testing of the right lower extremity and additional studies of the left shows: Bilateral sural and superficial peroneal sensory responses are within normal limits. Bilateral peroneal and tibial motor responses are within normal limits.  Proximal tibial motor response was technically challenging due to body physiognomy.  Bilateral tibial H reflex studies are within normal limits. There is no evidence of active or chronic motor axonal changes affecting any of the tested muscles.  Motor unit configuration and recruitment pattern is within normal limits.  Impression: This is a normal study of the lower extremities.  In particular, there is no evidence of a large fiber sensorimotor polyneuropathy or lumbosacral radiculopathy.    ___________________________ 53 ,DO    Nerve Conduction Studies Anti Sensory Summary Table   Stim Site NR Peak (ms) Norm Peak (ms) O-P Amp (V) Norm O-P Amp  Left Sup Peroneal Anti Sensory (Ant Lat Mall)  32C  12 cm    2.7 <4.5 12.6 >5  Right Sup Peroneal Anti Sensory (Ant Lat Mall)  32C  12 cm    2.3 <4.5 17.8 >5  Left Sural Anti Sensory (Lat Mall)  32C  Calf    3.3 <4.5 22.7 >5  Right Sural Anti Sensory (Lat Mall)  32C  Calf    3.4 <4.5 22.1 >5   Motor Summary Table   Stim Site NR Onset (ms) Norm Onset (ms) O-P Amp (mV) Norm O-P Amp Site1 Site2 Delta-0 (ms) Dist (cm) Vel (m/s) Norm Vel (m/s)  Left Peroneal Motor (Ext Dig Brev)  32C  Ankle    3.7 <5.5 4.6 >3 B Fib Ankle 7.2 36.0 50 >40  B Fib    10.9  4.3   Poplt B Fib 1.4 7.0 50 >40  Poplt    12.3  4.1         Right Peroneal Motor (Ext Dig Brev)  32C  Ankle    2.6 <5.5 4.6 >3 B Fib Ankle 7.0 39.0 56 >40  B Fib    9.6  3.9  Poplt B Fib 1.5 8.0 53 >40  Poplt    11.1  3.0         Left Tibial Motor (Abd Hall Brev)  32C    body habitus behind knee  Ankle    3.7 <6.0 9.2 >8 Knee Ankle 8.6 39.0 45 >40  Knee    12.3  0.6         Right Tibial Motor (Abd Hall Brev)  32C    body habitus behind knee  Ankle    4.3 <6.0 8.4 >8 Knee Ankle 8.1 39.0 48 >40  Knee    12.4  0.6          H Reflex Studies   NR H-Lat (ms) Lat Norm (ms) L-R H-Lat (ms)  Left Tibial (Gastroc)  32C     32.38 <35 0.00  Right Tibial (Gastroc)  32C     32.38 <35 0.00   EMG   Side Muscle Ins Act Fibs Fasc Recrt Dur. Amp. Poly. Activation Comment  Right AntTibialis Nml Nml  Nml Nml Nml Nml Nml Nml N/A  Right Gastroc Nml Nml Nml Nml Nml Nml Nml Nml N/A  Right Flex Dig Long Nml Nml Nml Nml Nml Nml Nml Nml N/A  Right RectFemoris Nml Nml Nml Nml Nml Nml Nml Nml N/A  Right GluteusMed Nml Nml Nml Nml Nml Nml Nml Nml N/A  Right BicepsFemS Nml Nml Nml Nml Nml Nml Nml Nml N/A  Left BicepsFemS Nml Nml Nml Nml Nml Nml Nml Nml N/A  Left AntTibialis Nml Nml Nml Nml Nml Nml Nml Nml N/A  Left Gastroc Nml Nml Nml Nml Nml Nml Nml Nml N/A  Left Flex Dig Long Nml Nml Nml Nml Nml Nml Nml Nml N/A  Left RectFemoris Nml Nml Nml Nml Nml Nml Nml Nml N/A  Left GluteusMed Nml Nml Nml Nml Nml Nml Nml Nml N/A      Waveforms:

## 2022-09-18 NOTE — Progress Notes (Signed)
Follow-up Visit   Date: 09/18/2022    Stacey Palmer MRN: [id number] DOB: 1980-11-19    Stacey Palmer is a 41 y.o.  female with depression returning to the clinic for follow-up of bilateral feet pain.  The patient was accompanied to the clinic by self.    IMPRESSION/PLAN: Bilateral lower extremity pain and paresthesias. Results of EMG discussed which is normal and shows no evidence of large fiber neuropathy or radiculopathy.  She continues to have widespread pain in the legs, which has not improved with PT.  The next step is to evaluate for structural disease of the lumbar spine with MRI lumbar spine.   Further recommendations pending results.   --------------------------------------------- History of present illness: Starting around 2021, she began having burning sensation over the soles of the feet and anterior lower legs and thigh. It is worse with walking or activity.  Rest, ice, and topical anti-inflammatory ointment helps her pain.  Symptoms are intermittent about once per week and lasts an hour.  She was started on gabapentin 300mg  at bedtime which helps but makes her very sleepy.  She also feels she is gaining weight on the medication.  She has been walking with a cane since June. She had EMG of the right leg at [facility], results not available. Occasionally, she also complaints of numbness and burning involving the hands.  No weakness of the arms or legs.    She will be working in July at [employer].   UPDATE 09/18/2022:  She is here for EDX of the legs.  She continues to have achy pain in the legs.  She takes gabapentin 100mg  which she tolerates better, but does not completely alleviate her pain.  She also feels as if she is weaker on the left leg.  Medications:  Current Outpatient Medications on File Prior to Visit  Medication Sig Dispense Refill   albuterol (VENTOLIN HFA) 108 (90 Base) MCG/ACT inhaler Inhale 2 puffs into the lungs every 6  (six) hours as needed for wheezing. 8 g 11   azelastine (OPTIVAR) 0.05 % ophthalmic solution Apply 1 drop to eye 2 (two) times daily. 6 mL 6   cephALEXin (KEFLEX) 500 MG capsule Take 1 capsule (500 mg total) by mouth 2 (two) times daily for 5 days. 10 capsule 0   Cholecalciferol (VITAMIN D3 PO) Take 1 capsule by mouth once a week.     fluticasone (FLONASE) 50 MCG/ACT nasal spray Place 2 sprays into both nostrils daily. 16 g 6   fluticasone (FLONASE) 50 MCG/ACT nasal spray Place 1 spray into both nostrils 2 (two) times daily. 16 g 6   gabapentin (NEURONTIN) 100 MG capsule Take 1-3 capsules (100-300 mg total) by mouth at bedtime. 180 capsule 3   guaiFENesin (MUCINEX) 600 MG 12 hr tablet Take 2 tablets (1,200 mg total) by mouth 2 (two) times daily as needed. 30 tablet 2   HYDROcodone bit-homatropine (HYCODAN) 5-1.5 MG/5ML syrup Take 5 mLs by mouth every 8 (eight) hours as needed for cough. 120 mL 0   ibuprofen (ADVIL) 600 MG tablet Take 1 tablet (600 mg total) by mouth every 6 (six) hours as needed. 30 tablet 0   ipratropium (ATROVENT) 0.03 % nasal spray Place 2 sprays into both nostrils 2 (two) times daily. 30 mL 0   levocetirizine (XYZAL) 5 MG tablet Take 1 tablet (5 mg total) by mouth every evening. 90 tablet 3   nitrofurantoin, macrocrystal-monohydrate, (MACROBID) 100 MG capsule Take 1 capsule (100  mg total) by mouth 2 (two) times daily. 10 capsule 0   phenazopyridine (PYRIDIUM) 100 MG tablet Take 1 tablet (100 mg total) by mouth 3 (three) times daily as needed for pain. 10 tablet 0   sertraline (ZOLOFT) 50 MG tablet Take 1 tablet by mouth daily. 90 tablet 3   No current facility-administered medications on file prior to visit.    Allergies: No Known Allergies  Vital Signs:  There were no vitals taken for this visit.   Exam deferred  Data: NCS/EMG of the legs 09/18/2022:  This is a normal study of the lower extremities.  In particular, there is no evidence of a large fiber sensorimotor  polyneuropathy or lumbosacral radiculopathy.   Thank you for allowing me to participate in patient's care.  If I can answer any additional questions, I would be pleased to do so.    Sincerely,     K. Posey Pronto, DO

## 2022-09-18 NOTE — Telephone Encounter (Signed)
Pt came by to get a work note from the UTI, she needs it back dated to Mon 11-13, to when you want her to return .Marland KitchenPlease place in [facility]

## 2022-09-18 NOTE — Addendum Note (Signed)
Addended by: Glendale Chard on: 09/18/2022 02:51 PM   Modules accepted: Orders

## 2022-09-19 ENCOUNTER — Telehealth (HOSPITAL_COMMUNITY): Payer: Self-pay | Admitting: Emergency Medicine

## 2022-09-19 ENCOUNTER — Ambulatory Visit
Admission: EM | Admit: 2022-09-19 | Discharge: 2022-09-19 | Disposition: A | Discharge status: Home / Self Care | Payer: 59 | Attending: Family Medicine | Admitting: Family Medicine

## 2022-09-19 ENCOUNTER — Ambulatory Visit: Admission: EM | Admit: 2022-09-19 | Payer: 59 | Source: Home / Self Care

## 2022-09-19 DIAGNOSIS — N390 Urinary tract infection, site not specified: Secondary | ICD-10-CM

## 2022-09-19 LAB — POCT URINALYSIS DIP (MANUAL ENTRY)
Bilirubin, UA: NEGATIVE
Blood, UA: NEGATIVE
Glucose, UA: NEGATIVE mg/dL
Ketones, POC UA: NEGATIVE mg/dL
Leukocytes, UA: NEGATIVE
Nitrite, UA: POSITIVE — AB
Protein Ur, POC: NEGATIVE mg/dL
Spec Grav, UA: >=1.03 — AB (ref 1.010–1.025)
Urobilinogen, UA: 1 E.U./dL
pH, UA: 6 (ref 5.0–8.0)

## 2022-09-19 LAB — URINE CULTURE: Culture: >=100000 — AB

## 2022-09-19 MED ORDER — SULFAMETHOXAZOLE-TRIMETHOPRIM 800-160 MG PO TABS: 1.0000 | ORAL_TABLET | Freq: Two times a day (BID) | ORAL | 0 refills | Status: AC

## 2022-09-19 NOTE — Telephone Encounter (Signed)
Patient called after getting my message about her urine culture showing susceptibility and to complete abx.  She states she is still having pretty severe irritation and has not been able to return to work.  I encouraged her to f/u with PCP, but she states she is leaving the office.  So I encouraged her to f/u with [place] today if she needs a new note.

## 2022-09-19 NOTE — ED Provider Notes (Signed)
[facility]    CSN: [id number] Arrival date & time: 09/19/22  1726      History   Chief Complaint Chief Complaint  Patient presents with   Abdominal Pain    HPI Stacey Palmer is a 41 y.o. female.   Patient presenting today with ongoing suprapubic pressure and pain, dysuria, fatigue after being diagnosed several days ago with a urinary tract infection and being placed on Keflex.  States she has gotten slightly better since starting medication but certainly not full resolution of symptoms.  Urine culture came back this morning and showed susceptibility to the Keflex but patient not fully resolving.  She is requesting an extension for her work note as she is not feeling well enough to go back to work.  Has been having some chills and sweats but has not measured a fever.    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   pHQ 9 SCORE OF 12 IN 12/2018, score of 0 in 03/2019 on medication   Dry eyes 12/26/2018   Encounter for gynecological examination with Papanicolaou smear of cervix 01/28/2022   Encounter for screening fecal occult blood testing 01/28/2022   Family planning 01/28/2022   Fatigue 12/06/2019   Feeling sad 01/28/2022   H/O gastric sleeve 2018   Hair loss 12/06/2019   History of mammogram 2021   Obesity    Obesity (BMI 30-39.9) 06/11/2015   S/p bariatric surgery in 2017   Plantar fasciitis    Pregnancy examination or test, negative result 01/28/2022   Skin tag 06/05/2014   Sleep apnea    URI (upper respiratory infection) 08/23/2012   Vaginal irritation 09/18/2020   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   Dx 06/2014    Patient Active Problem List   Diagnosis Date Noted   Feeling sad 06/16/2022   Neuropathy 04/03/2022   COVID-19 11/30/2020   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   Hip pain, right 09/16/2020   Snoring  09/16/2020   Labial skin tag 09/13/2020   Environmental and seasonal allergies 12/06/2019   GAD (generalized anxiety disorder) 12/26/2018   Chronic dryness of both eyes 12/26/2018   Gastroesophageal reflux disease 06/04/2017   Vitamin D deficiency 10/21/2016   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 Authorizing Provider  sulfamethoxazole-trimethoprim (BACTRIM DS) 800-160 MG tablet Take 1 tablet by mouth 2 (two) times daily for 3 days. 09/19/22 09/22/22 Yes Particia Nearing, PA-C  albuterol (VENTOLIN HFA) 108 (90 Base) MCG/ACT inhaler Inhale 2  puffs into the lungs every 6 (six) hours as needed for wheezing. 08/12/22   Tollie Eth, NP  azelastine (OPTIVAR) 0.05 % ophthalmic solution Apply 1 drop to eye 2 (two) times daily. 04/03/22   Tollie Eth, NP  cephALEXin (KEFLEX) 500 MG capsule Take 1 capsule (500 mg total) by mouth 2 (two) times daily for 5 days. 09/16/22 09/21/22  Valentino Nose, NP  Cholecalciferol (VITAMIN D3 PO) Take 1 capsule by mouth once a week.    [provider]  fluticasone (FLONASE) 50 MCG/ACT nasal spray Place 2 sprays into both nostrils daily. 07/18/22   Early, Sung Amabile, NP  fluticasone (FLONASE) 50 MCG/ACT nasal spray Place 1 spray into both nostrils 2 (two) times daily. 08/18/22   Particia Nearing, PA-C  gabapentin (NEURONTIN) 100 MG capsule Take 1-3 capsules (100-300 mg total) by mouth at bedtime. 07/18/22   Tollie Eth,  NP  guaiFENesin (MUCINEX) 600 MG 12 hr tablet Take 2 tablets (1,200 mg total) by mouth 2 (two) times daily as needed. 08/12/22   Tollie Eth, NP  HYDROcodone bit-homatropine (HYCODAN) 5-1.5 MG/5ML syrup Take 5 mLs by mouth every 8 (eight) hours as needed for cough. 08/12/22   Tollie Eth, NP  ibuprofen (ADVIL) 600 MG tablet Take 1 tablet (600 mg total) by mouth every 6 (six) hours as needed. 08/12/22   Tollie Eth, NP  ipratropium (ATROVENT) 0.03 % nasal spray Place 2 sprays into both nostrils 2 (two) times daily. 04/03/22   Tollie Eth, NP  levocetirizine (XYZAL) 5 MG tablet Take 1 tablet (5 mg total) by mouth every evening. 07/18/22   Tollie Eth, NP  nitrofurantoin, macrocrystal-monohydrate, (MACROBID) 100 MG capsule Take 1 capsule (100 mg total) by mouth 2 (two) times daily. 09/17/22   Tollie Eth, NP  phenazopyridine (PYRIDIUM) 100 MG tablet Take 1 tablet (100 mg total) by mouth 3 (three) times daily as needed for pain. 09/16/22   Valentino Nose, NP  sertraline (ZOLOFT) 50 MG tablet Take 1 tablet by mouth daily. 07/18/22   Early, Sung Amabile, NP    Family History Family History  Problem Relation Age of Onset   Hypertension Mother    Kidney disease Father    Diabetes Father    Breast cancer Paternal Aunt    Dementia Paternal Grandmother    Asthma Daughter     Social History Social History   Tobacco Use   Smoking status: Never    Passive exposure: Never   Smokeless tobacco: Never  Vaping Use   Vaping Use: Never used  Substance Use Topics   Alcohol use: No   Drug use: No     Allergies   Patient has no known allergies.   Review of Systems Review of Systems Per HPI  Physical Exam Triage Vital Signs ED Triage Vitals  Enc Vitals Group     BP 09/19/22 1745 (!) 144/102     Pulse Rate 09/19/22 1745 (!) 107     Resp 09/19/22 1745 16     Temp 09/19/22 1745 98.8 F (37.1 C)     Temp Source 09/19/22 1745 Oral     SpO2 09/19/22 1745 96 %     Weight --       Height --      Head Circumference --      Peak Flow --      Pain Score 09/19/22 1746 8     Pain Loc --  Pain Edu? --      Excl. in GC? --    No data found.  Updated Vital Signs BP (!) 144/102 (BP Location: Right Arm)   Pulse (!) 107   Temp 98.8 F (37.1 C) (Oral)   Resp 16   LMP 09/05/2022 (Approximate)   SpO2 96%   Visual Acuity Right Eye Distance:   Left Eye Distance:   Bilateral Distance:    Right Eye Near:   Left Eye Near:    Bilateral Near:     Physical Exam Vitals and nursing note reviewed.  Constitutional:      Appearance: Normal appearance. She is not ill-appearing.  HENT:     Head: Atraumatic.  Eyes:     Extraocular Movements: Extraocular movements intact.     Conjunctiva/sclera: Conjunctivae normal.  Cardiovascular:     Rate and Rhythm: Normal rate and regular rhythm.     Heart sounds: Normal heart sounds.  Pulmonary:     Effort: Pulmonary effort is normal.     Breath sounds: Normal breath sounds.  Abdominal:     General: Bowel sounds are normal. There is no distension.     Palpations: Abdomen is soft.     Tenderness: There is no abdominal tenderness. There is no right CVA tenderness, left CVA tenderness or guarding.  Musculoskeletal:        General: Normal range of motion.     Cervical back: Normal range of motion and neck supple.  Skin:    General: Skin is warm and dry.  Neurological:     Mental Status: She is alert and oriented to person, place, and time.  Psychiatric:        Mood and Affect: Mood normal.        Thought Content: Thought content normal.        Judgment: Judgment normal.      UC Treatments / Results  Labs (all labs ordered are listed, but only abnormal results are displayed) Labs Reviewed  POCT URINALYSIS DIP (MANUAL ENTRY) - Abnormal; Notable for the following components:      Result Value   Spec Grav, UA >=1.030 (*)    Nitrite, UA Positive (*)    All other components within normal limits     EKG   Radiology NCV with EMG(electromyography)  Result Date: 09/18/2022 Glendale Chard, DO     09/18/2022 12:39 PM [facility] [address] [place], [address]  [place], [place] [address] Tel: [phone] Fax:  [phone] Test Date:  09/18/2022 Patient: Jadalyn Oliveri DOB: 07/08/1981 Physician: Roxana Hires Patel,DO Sex: Female Height:  Ref Phys: Nita Sickle, DO ID#: [id number]   Technician:  Patient Complaints: This is a 41 year-old female referred for evaluation of bilateral leg and feet pain. NCV & EMG Findings: Electrodiagnostic testing of the right lower extremity and additional studies of the left shows: Bilateral sural and superficial peroneal sensory responses are within normal limits. Bilateral peroneal and tibial motor responses are within normal limits.  Proximal tibial motor response was technically challenging due to body physiognomy. Bilateral tibial H reflex studies are within normal limits. There is no evidence of active or chronic motor axonal changes affecting any of the tested muscles.  Motor unit configuration and recruitment pattern is within normal limits. Impression: This is a normal study of the lower extremities.  In particular, there is no evidence of a large fiber sensorimotor polyneuropathy or lumbosacral radiculopathy. ___________________________ Roxana Hires Patel,DO Nerve Conduction Studies Anti Sensory Summary Table  Stim  Site NR Peak (ms) Norm Peak (ms) O-P Amp (V) Norm O-P Amp Left Sup Peroneal Anti Sensory (Ant Lat Mall)  32C 12 cm    2.7 <4.5 12.6 >5 Right Sup Peroneal Anti Sensory (Ant Lat Mall)  32C 12 cm    2.3 <4.5 17.8 >5 Left Sural Anti Sensory (Lat Mall)  32C Calf    3.3 <4.5 22.7 >5 Right Sural Anti Sensory (Lat Mall)  32C Calf    3.4 <4.5 22.1 >5 Motor Summary Table  Stim Site NR Onset (ms) Norm Onset (ms) O-P Amp (mV) Norm O-P Amp Site1 Site2 Delta-0 (ms) Dist (cm) Vel (m/s) Norm Vel (m/s) Left Peroneal Motor (Ext Dig Brev)  32C Ankle    3.7 <5.5 4.6 >3  B Fib Ankle 7.2 36.0 50 >40 B Fib    10.9  4.3  Poplt B Fib 1.4 7.0 50 >40 Poplt    12.3  4.1        Right Peroneal Motor (Ext Dig Brev)  32C Ankle    2.6 <5.5 4.6 >3 B Fib Ankle 7.0 39.0 56 >40 B Fib    9.6  3.9  Poplt B Fib 1.5 8.0 53 >40 Poplt    11.1  3.0        Left Tibial Motor (Abd Hall Brev)  32C    body habitus behind knee Ankle    3.7 <6.0 9.2 >8 Knee Ankle 8.6 39.0 45 >40 Knee    12.3  0.6        Right Tibial Motor (Abd Hall Brev)  32C    body habitus behind knee Ankle    4.3 <6.0 8.4 >8 Knee Ankle 8.1 39.0 48 >40 Knee    12.4  0.6        H Reflex Studies  NR H-Lat (ms) Lat Norm (ms) L-R H-Lat (ms) Left Tibial (Gastroc)  32C    32.38 <35 0.00 Right Tibial (Gastroc)  32C    32.38 <35 0.00 EMG  Side Muscle Ins Act Fibs Fasc Recrt Dur. Amp. Poly. Activation Comment Right AntTibialis Nml Nml Nml Nml Nml Nml Nml Nml N/A Right Gastroc Nml Nml Nml Nml Nml Nml Nml Nml N/A Right Flex Dig Long Nml Nml Nml Nml Nml Nml Nml Nml N/A Right RectFemoris Nml Nml Nml Nml Nml Nml Nml Nml N/A Right GluteusMed Nml Nml Nml Nml Nml Nml Nml Nml N/A Right BicepsFemS Nml Nml Nml Nml Nml Nml Nml Nml N/A Left BicepsFemS Nml Nml Nml Nml Nml Nml Nml Nml N/A Left AntTibialis Nml Nml Nml Nml Nml Nml Nml Nml N/A Left Gastroc Nml Nml Nml Nml Nml Nml Nml Nml N/A Left Flex Dig Long Nml Nml Nml Nml Nml Nml Nml Nml N/A Left RectFemoris Nml Nml Nml Nml Nml Nml Nml Nml N/A Left GluteusMed Nml Nml Nml Nml Nml Nml Nml Nml N/A Waveforms:              Procedures Procedures (including critical care time)  Medications Ordered in UC Medications - No data to display  Initial Impression / Assessment and Plan / UC Course  I have reviewed the triage vital signs and the nursing notes.  Pertinent labs & imaging results that were available during my care of the patient were reviewed by me and considered in my medical decision making (see chart for details).     Still nitrite positive on urinalysis, though it does look improved from  previous collection several days ago.  We will add Bactrim as  the Keflex is not improving her symptoms as expected, push fluids, continue to monitor for improvement.  Work note extended.  Return for worsening symptoms.  Final Clinical Impressions(s) / UC Diagnoses   Final diagnoses:  Acute lower UTI   Discharge Instructions   None    ED Prescriptions     Medication Sig Dispense Auth. Provider   sulfamethoxazole-trimethoprim (BACTRIM DS) 800-160 MG tablet Take 1 tablet by mouth 2 (two) times daily for 3 days. 6 tablet Particia Nearing, [place]      PDMP not reviewed this encounter.   Particia Nearing, [place] 09/19/22 1815

## 2022-09-19 NOTE — ED Triage Notes (Signed)
Pt states burning with urination,lower abdominal pain and urinary frequency for the past 3 days. Seen here for the same on day 2 of antibiotics.

## 2022-09-23 NOTE — Telephone Encounter (Signed)
Letter has been sent through MyChart.

## 2022-10-01 ENCOUNTER — Telehealth: Payer: Self-pay | Admitting: Nurse Practitioner

## 2022-10-01 NOTE — Telephone Encounter (Signed)
Pt called again regarding these forms. Spoke with Tamela Oddi SB last day with [facility] office was the next day on 11/17. Let pt know that her other office should have the paperwork. Gave pt the number to the office and fax number.

## 2022-10-01 NOTE — Telephone Encounter (Signed)
Pt states that she called [employer] concerning her fmla paper work and was advised that due to pt staying with you that forms were taken with you when you left. Pt would like to know if forms have been completed and what the status is. Please advise pt at [phone].

## 2022-10-02 ENCOUNTER — Telehealth: Payer: Self-pay | Admitting: Nurse Practitioner

## 2022-10-02 NOTE — Telephone Encounter (Signed)
Please call the patient to clarify the dates needed for her [name] paperwork. I was able to find the paperwork in a stack of paperwork left by my CMA at the previous location, but I am not sure what dates she was out. Thank you!

## 2022-10-02 NOTE — Telephone Encounter (Signed)
Pt called and states that the dates for fmla forms is Nov. 13 - 18, 2023. Pt can be reached at [phone].

## 2022-10-09 NOTE — Telephone Encounter (Signed)
Adam faxed [name]

## 2022-10-17 ENCOUNTER — Ambulatory Visit (HOSPITAL_BASED_OUTPATIENT_CLINIC_OR_DEPARTMENT_OTHER): Payer: 59 | Admitting: Nurse Practitioner

## 2022-10-17 ENCOUNTER — Ambulatory Visit: Payer: 59 | Admitting: Nurse Practitioner

## 2022-10-20 ENCOUNTER — Ambulatory Visit: Payer: 59 | Admitting: Podiatry

## 2022-10-21 ENCOUNTER — Ambulatory Visit (INDEPENDENT_AMBULATORY_CARE_PROVIDER_SITE_OTHER): Payer: 59 | Admitting: Nurse Practitioner

## 2022-10-21 ENCOUNTER — Encounter: Payer: 59 | Admitting: Neurology

## 2022-10-21 ENCOUNTER — Encounter: Payer: Self-pay | Admitting: Nurse Practitioner

## 2022-10-21 ENCOUNTER — Other Ambulatory Visit (HOSPITAL_COMMUNITY): Payer: Self-pay

## 2022-10-21 VITALS — BP 128/82 | HR 69 | Temp 98.3°F | Wt 269.8 lb

## 2022-10-21 DIAGNOSIS — J029 Acute pharyngitis, unspecified: Secondary | ICD-10-CM

## 2022-10-21 DIAGNOSIS — Z6841 Body Mass Index (BMI) 40.0 and over, adult: Secondary | ICD-10-CM | POA: Diagnosis not present

## 2022-10-21 MED ORDER — BENZONATATE 200 MG PO CAPS
200.0000 mg | ORAL_CAPSULE | Freq: Three times a day (TID) | ORAL | 0 refills | Status: DC | PRN
  Filled 2022-10-21: qty 30, 10d supply, fill #0

## 2022-10-21 MED ORDER — PHENTERMINE HCL 37.5 MG PO TABS
37.5000 mg | ORAL_TABLET | Freq: Every day | ORAL | 2 refills | Status: DC
  Filled 2022-10-21: qty 30, 30d supply, fill #0

## 2022-10-21 MED ORDER — LIDOCAINE VISCOUS HCL 2 % MT SOLN
15.0000 mL | OROMUCOSAL | 1 refills | Status: DC | PRN
  Filled 2022-10-21: qty 100, 2d supply, fill #0

## 2022-10-21 NOTE — Assessment & Plan Note (Signed)
Onset of symptoms today. Likely viral etiology. No alarm sx present. Posterior oropharynx clear with no exudate present. Lung clear. Sinuses clear. Will send xylocaine for sore throat relief and tessalon for cough.

## 2022-10-21 NOTE — Patient Instructions (Signed)
WEIGHT LOSS PLANNING Your progress today shows:     10/21/2022   10:37 AM 09/19/2022    5:45 PM 09/16/2022    9:38 AM  Vitals with BMI  Weight 269 lbs 13 oz    Systolic 128 144 202  Diastolic 82 102 86  Pulse 69 107 60    For best management of weight, it is vital to balance intake versus output. This means the number of calories burned per day must be less than the calories you take in with food and drink.   I recommend trying to follow a diet with the following: Calories: 1200-1500 calories per day Carbohydrates: 150-180 grams of carbohydrates per day  Why: Gives your body enough "quick fuel" for cells to maintain normal function without sending them into starvation mode.  Protein: At least 45-55 grams of protein per day Why: Protein takes longer and uses more energy than carbohydrates to break down for fuel. The carbohydrates in your meals serves as quick energy sources and proteins help use some of that extra quick energy to break down to produce long term energy. This helps you not feel hungry as quickly and protein breakdown burns calories.  Water: Drink AT LEAST 64 ounces of water per day  Why: Water is essential to healthy metabolism. Water helps to fill the stomach and keep you fuller longer. Water is required for healthy digestion and filtering of waste in the body.  Fat: Limit fats in your diet- when choosing fats, choose foods with lower fats content such as lean meats (chicken, fish, [place]).  Why: Increased fat intake leads to storage "for later". Once you burn your carbohydrate energy, your body goes into fat and protein breakdown mode to help you loose weight.  Cholesterol: Fats and oils that are LIQUID at room temperature are best. Choose vegetable oils (olive oil, avocado oil, nuts). Avoid fats that are SOLID at room temperature (animal fats, processed meats). Healthy fats are often found in whole grains, beans, nuts, seeds, and berries.  Why: Elevated cholesterol levels  lead to build up of cholesterol on the inside of your blood vessels. This will eventually cause the blood vessels to become hard and can lead to high blood pressure and damage to your organs. When the blood flow is reduced, but the pressure is high from cholesterol buildup, parts of the cholesterol can break off and form clots that can go to the brain or heart leading to a stroke or heart attack.  Fiber: Increase amount of SOLUBLE the fiber in your diet. This helps to fill you up, lowers cholesterol, and helps with digestion. Some foods high in soluble fiber are oats, peas, beans, apples, carrots, barley, and citrus fruits.   Why: Fiber fills you up, helps remove excess cholesterol, and aids in healthy digestion which are all very important in weight management.   I recommend the following as a minimum activity routine: Purposeful walk or other physical activity at least 20 minutes every single day. This means purposefully taking a walk, jog, bike, swim, treadmill, elliptical, dance, etc.  This activity should be ABOVE your normal daily activities, such as walking at work. Goal exercise should be at least 150 minutes a week- work your way up to this.   Heart Rate: Your maximum exercise heart rate should be 220 - Your Age in Years. When exercising, get your heart rate up, but avoid going over the maximum targeted heart rate.  60-70% of your maximum heart rate is where you tend  to burn the most fat. To find this number:  220 - Age In Years= Max HR  Max HR x 0.6 (or 0.7) = Fat Burning HR The Fat Burning HR is your goal heart rate while working out to burn the most fat.  NEVER exercise to the point your feel lightheaded, weak, nauseated, dizzy. If you experience ANY of these symptoms- STOP exercise! Allow yourself to cool down and your heart rate to come down. Then restart slower next time.  If at ANY TIME you feel chest pain or chest pressure during exercise, STOP IMMEDIATELY and seek medical  attention.

## 2022-10-21 NOTE — Assessment & Plan Note (Signed)
BMI 43.55. Weight management options discussed with patient including diet and exercise recommendations. Handout provided for diet goals for carbohydrates, fats, proteins, and fiber intake. Discussed daily walking with purposeful walk. She and her daughter are going to help hold eachother accountable.Discussed reading labels on packaging and prepackaging snacks with serving sizes individually to reduce risk of overeating.  Will plan to start phentermine in addition to diet and exercise plan. Discussed no more than 6 months on this medication. May consider transition to GLP-1 based on insurance coverage, if possible. All questions answered. F/U in 3 months. PDMP reviewed.

## 2022-10-21 NOTE — Progress Notes (Signed)
Shawna Clamp, DNP, AGNP-c [facility]  [address] [place], [place] [address] [phone]  ESTABLISHED PATIENT- Chronic Health and/or Follow-Up Visit  Blood pressure 128/82, pulse 69, temperature 98.3 F (36.8 C), weight 269 lb 12.8 oz (122.4 kg), last menstrual period 10/17/2022.    Stacey Palmer is a 41 y.o. year old female presenting today for evaluation and management of the following: She recently had some conflict with work and her recent transfer in October. 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 Both of these have been difficult for her. 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   Sore throat She tells me she noticed a sore throat with sudden onset starting today. She has been feeling hot and cold, but has not taken her temperature. She also endorses a cough and sneezing.   Weight Management Stacey Palmer endorses unintentional weight gain in recent months with difficulty losing weight. She reports that she has a difficult time with snacking and does not exercise outside of work. She is eager to loose weight and would like to know what options there are to help with this. She and her daughter plan to work on [employer] together.   All ROS negative with exception of what is listed above.   PHYSICAL EXAM Physical Exam Vitals and nursing note reviewed.  Constitutional:      Appearance: Normal appearance. She is obese.  HENT:     Head: Normocephalic.     Nose: Nose normal.     Mouth/Throat:     Mouth: Mucous membranes are moist.     Pharynx: Oropharynx is clear.  Eyes:     Pupils: Pupils are equal, round, and reactive to light.  Cardiovascular:     Rate and Rhythm: Normal rate and regular rhythm.     Pulses: Normal pulses.     Heart sounds: Normal heart sounds.  Pulmonary:     Effort: Pulmonary effort is normal.     Breath sounds:  Normal breath sounds.  Abdominal:     Palpations: Abdomen is soft.  Musculoskeletal:        General: Normal range of motion.     Cervical back: Normal range of motion.     Right lower leg: No edema.     Left lower leg: No edema.  Lymphadenopathy:     Cervical: No cervical adenopathy.  Skin:    General: Skin is warm and dry.     Capillary Refill: Capillary refill takes less than 2 seconds.  Neurological:     General: No focal deficit present.     Mental Status: She is alert.  Psychiatric:        Mood and Affect: Mood normal.     PLAN Problem List Items Addressed This Visit     Morbid obesity with BMI of 40.0-44.9, adult (HCC) - Primary    BMI 43.55. Weight management options discussed with patient including diet and exercise recommendations. Handout provided for diet goals for carbohydrates, fats, proteins, and fiber intake. Discussed daily walking with purposeful walk. She and her daughter are going to help hold eachother accountable.Discussed reading labels on packaging and prepackaging snacks with serving sizes individually to reduce risk of overeating.  Will plan to start phentermine in addition to diet and exercise plan. Discussed no more than 6 months on this medication. May consider transition to GLP-1 based on insurance coverage, if possible. All  questions answered. F/U in 3 months. PDMP reviewed.       Relevant Medications   phentermine (ADIPEX-P) 37.5 MG tablet   Sore throat    Onset of symptoms today. Likely viral etiology. No alarm sx present. Posterior oropharynx clear with no exudate present. Lung clear. Sinuses clear. Will send xylocaine for sore throat relief and tessalon for cough.      Relevant Medications   lidocaine (XYLOCAINE) 2 % solution   benzonatate (TESSALON) 200 MG capsule    Return in about 3 months (around 01/20/2023) for Weight- Virtual [place].   Shawna Clamp, DNP, AGNP-c 10/21/2022 10:39 AM

## 2022-11-14 ENCOUNTER — Encounter: Payer: Self-pay | Admitting: Podiatry

## 2022-11-14 ENCOUNTER — Ambulatory Visit: Payer: 59 | Admitting: Podiatry

## 2022-11-14 ENCOUNTER — Ambulatory Visit (INDEPENDENT_AMBULATORY_CARE_PROVIDER_SITE_OTHER): Payer: 59

## 2022-11-14 DIAGNOSIS — S99922A Unspecified injury of left foot, initial encounter: Secondary | ICD-10-CM

## 2022-11-18 ENCOUNTER — Ambulatory Visit: Payer: 59 | Admitting: Podiatry

## 2022-11-19 NOTE — Progress Notes (Signed)
Subjective:   Patient ID: Stacey Palmer, female   DOB: 42 y.o.   MRN: [id number]   HPI Patient is concerned about trauma of the left big toe with swelling of the toe and states that she has developed soreness associated with this.  She did hit it 2 weeks ago   ROS      Objective:  Physical Exam  Neuro vascular status intact muscle strength adequate swelling inflammation of the left hallux that is painful when pressed localized no indications of an abnormal position of the big toe     Assessment:  Trauma to his left big toe with pain cannot rule out fracture     Plan:  H&P reviewed condition x-ray and I do think this will heal uneventfully but will probably take 8 weeks.  Patient can wear shoes and just has to be careful and is encouraged to call questions concerns  X-rays indicate that there is no signs of any acute fracture appears to be a soft tissue injury

## 2022-11-25 ENCOUNTER — Other Ambulatory Visit (HOSPITAL_COMMUNITY): Payer: Self-pay

## 2022-11-25 ENCOUNTER — Other Ambulatory Visit (HOSPITAL_BASED_OUTPATIENT_CLINIC_OR_DEPARTMENT_OTHER): Payer: Self-pay | Admitting: Nurse Practitioner

## 2022-11-25 DIAGNOSIS — N390 Urinary tract infection, site not specified: Secondary | ICD-10-CM | POA: Diagnosis not present

## 2022-11-25 DIAGNOSIS — N3090 Cystitis, unspecified without hematuria: Secondary | ICD-10-CM

## 2022-11-26 ENCOUNTER — Ambulatory Visit: Payer: Self-pay

## 2022-12-02 ENCOUNTER — Encounter: Payer: Self-pay | Admitting: Nurse Practitioner

## 2022-12-02 ENCOUNTER — Ambulatory Visit (INDEPENDENT_AMBULATORY_CARE_PROVIDER_SITE_OTHER): Payer: 59 | Admitting: Podiatry

## 2022-12-02 ENCOUNTER — Other Ambulatory Visit (HOSPITAL_COMMUNITY): Payer: Self-pay

## 2022-12-02 ENCOUNTER — Ambulatory Visit (INDEPENDENT_AMBULATORY_CARE_PROVIDER_SITE_OTHER): Payer: 59 | Admitting: Nurse Practitioner

## 2022-12-02 VITALS — BP 130/86 | HR 68 | Temp 99.1°F | Ht 67.0 in | Wt 270.2 lb

## 2022-12-02 DIAGNOSIS — G609 Hereditary and idiopathic neuropathy, unspecified: Secondary | ICD-10-CM | POA: Diagnosis not present

## 2022-12-02 DIAGNOSIS — R300 Dysuria: Secondary | ICD-10-CM | POA: Diagnosis not present

## 2022-12-02 DIAGNOSIS — N3090 Cystitis, unspecified without hematuria: Secondary | ICD-10-CM

## 2022-12-02 LAB — POCT URINALYSIS DIP (PROADVANTAGE DEVICE)
Bilirubin, UA: NEGATIVE
Glucose, UA: NEGATIVE mg/dL
Ketones, POC UA: NEGATIVE mg/dL
Leukocytes, UA: NEGATIVE
Nitrite, UA: NEGATIVE
Protein Ur, POC: NEGATIVE mg/dL
Specific Gravity, Urine: 1.015
Urobilinogen, Ur: 0.2
pH, UA: 7.5 (ref 5.0–8.0)

## 2022-12-02 MED ORDER — SULFAMETHOXAZOLE-TRIMETHOPRIM 800-160 MG PO TABS
1.0000 | ORAL_TABLET | Freq: Two times a day (BID) | ORAL | 0 refills | Status: DC
  Filled 2022-12-02: qty 14, 7d supply, fill #0

## 2022-12-02 MED ORDER — GABAPENTIN 300 MG PO CAPS
300.0000 mg | ORAL_CAPSULE | Freq: Two times a day (BID) | ORAL | 3 refills | Status: DC
  Filled 2022-12-02: qty 60, 30d supply, fill #0

## 2022-12-02 NOTE — Patient Instructions (Addendum)
Be sure you are drinking plenty of water.

## 2022-12-02 NOTE — Progress Notes (Signed)
  Orma Render, DNP, AGNP-c [facility] [address] [place], [place] [address] [phone]  Subjective:   Stacey Palmer is a 42 y.o. female presents to day for evaluation of: UTI She was recently seen in UC for UTI and treated with antibiotics. She tells me she is still having pain with urination. She has finished the initial treatment. She does not think she is having a fever. She reports tenderness in the suprapubic region and low back pain.   PMH, Medications, and Allergies reviewed and updated in chart as appropriate.   ROS negative except for what is listed in HPI. Objective:  BP 130/86   Pulse 68   Temp 99.1 F (37.3 C) (Tympanic)   Ht 5\' 7"  (1.702 m)   Wt 270 lb 3.2 oz (122.6 kg)   LMP 11/02/2022 (Exact Date)   BMI 42.32 kg/m  Physical Exam Vitals and nursing note reviewed.  Constitutional:      Appearance: Normal appearance.  Cardiovascular:     Rate and Rhythm: Normal rate and regular rhythm.     Pulses: Normal pulses.     Heart sounds: Normal heart sounds.  Pulmonary:     Effort: Pulmonary effort is normal.     Breath sounds: Normal breath sounds.  Abdominal:     General: There is no distension.     Palpations: Abdomen is soft.     Tenderness: There is abdominal tenderness. There is no right CVA tenderness, left CVA tenderness or guarding.  Skin:    General: Skin is warm and dry.     Capillary Refill: Capillary refill takes less than 2 seconds.  Neurological:     Mental Status: She is alert and oriented to person, place, and time.  Psychiatric:        Mood and Affect: Mood normal.           Assessment & Plan:   Problem List Items Addressed This Visit     Cystitis - Primary    UA positive in the office today despite antibiotic therapy. Unclear if culture was sent before. Concern is present for recurrent UTI, she may not be responding to antibiotics or there may be other factors that are causing these. Recommend she increase water  intake and not hold urine. Wipe from front to back. Will start new antibiotic to see if we can get different coverage. If UTI/dysuria continues she will need referral to urology.       Relevant Medications   sulfamethoxazole-trimethoprim (BACTRIM DS) 800-160 MG tablet   Other Visit Diagnoses     Dysuria       Relevant Orders   POCT Urinalysis DIP (Proadvantage Device) (Completed)         Orma Render, DNP, AGNP-c 12/11/2022  10:03 PM    History, Medications, Surgery, SDOH, and Family History reviewed and updated as appropriate.

## 2022-12-05 ENCOUNTER — Other Ambulatory Visit (HOSPITAL_COMMUNITY): Payer: Self-pay

## 2022-12-08 ENCOUNTER — Encounter: Payer: Self-pay | Admitting: Nurse Practitioner

## 2022-12-08 ENCOUNTER — Ambulatory Visit (INDEPENDENT_AMBULATORY_CARE_PROVIDER_SITE_OTHER): Payer: 59 | Admitting: Nurse Practitioner

## 2022-12-08 VITALS — BP 128/86 | HR 70 | Ht 67.0 in | Wt 268.6 lb

## 2022-12-08 DIAGNOSIS — Z0000 Encounter for general adult medical examination without abnormal findings: Secondary | ICD-10-CM | POA: Diagnosis not present

## 2022-12-08 DIAGNOSIS — F3341 Major depressive disorder, recurrent, in partial remission: Secondary | ICD-10-CM | POA: Diagnosis not present

## 2022-12-08 DIAGNOSIS — Z6841 Body Mass Index (BMI) 40.0 and over, adult: Secondary | ICD-10-CM | POA: Diagnosis not present

## 2022-12-08 NOTE — Patient Instructions (Addendum)
We will need to get your labs this week to be able to complete the paperwork for you. We have to check a TB test to make sure you do not have TB.  Otherwise everything looks good!   We can see what weight management medications you may qualify for once we get your labs back.

## 2022-12-08 NOTE — Progress Notes (Signed)
Subjective:  Patient ID: Stacey Palmer, female    DOB: September 12, 1981,  MRN: [id number]  Chief Complaint  Patient presents with   Peripheral Neuropathy    "Comes and goes"    42 y.o. female returns with the above complaint. History confirmed with patient.  She had the neurodiagnostic testing repeated again.  It was normal.  She is still experiencing burning tingling pain on the bottom of the feet.  She recently stubbed her toe it is doing better although the still some soreness and stiffness  Objective:  Physical Exam: warm, good capillary refill, no trophic changes or ulcerative lesions, normal DP and PT pulses and normal sensory exam.  Today she is experiencing some burning pain, no Tinel sign in the tarsal tunnel bilateral, sensation to light touch intact, left great toe range of motion intact some edema no pain tenderness or ecchymosis  Radiographs: X-ray of both feet: no fracture, dislocation, swelling or degenerative changes noted, hallux valgus deformity and pes planus  Study Result  Narrative & Impression  CLINICAL DATA:  Heel pain and burning since March 2022.   EXAM: MR OF THE LEFT HEEL WITHOUT CONTRAST   TECHNIQUE: Multiplanar, multisequence MR imaging of the right heel was performed. No intravenous contrast was administered.   COMPARISON:  None.   FINDINGS: TENDONS   Peroneal: Mild tendinosis and tenosynovitis of the peroneus longus. Peroneal brevis intact.   Posteromedial: Posterior tibial tendon intact. Flexor hallucis longus tendon intact. Flexor digitorum longus tendon intact.   Anterior: Tibialis anterior tendon intact. Extensor hallucis longus tendon intact Extensor digitorum longus tendon intact.   Achilles:  Intact.   Plantar Fascia: Plantar fascia is intact. Mild muscle edema in the abductor hallucis muscle and flexor digitorum brevis muscle concerning for muscle strain.   LIGAMENTS   Lateral: Chronic partial tear of the anterior talofibular  ligament. Calcaneofibular ligament intact. Posterior talofibular ligament intact. Anterior and posterior tibiofibular ligaments intact.   Medial: Chronic partial tear of the deltoid ligament. Spring ligament intact.   CARTILAGE   Ankle Joint: No joint effusion. Partial-thickness cartilage loss of the talofibular joint.   Subtalar Joints/Sinus Tarsi: Normal subtalar joints. No subtalar joint effusion. Normal sinus tarsi.   Bones: No acute fracture or dislocation. Relative pes planus. Small plantar calcaneal spur. Subcortical reactive marrow changes in the medial malleolus adjacent to the deltoid ligament insertion through   Soft Tissue: No fluid collection or hematoma. Mild muscle edema in the abductor hallucis muscle and flexor digitorum brevis muscle concerning for muscle strain. Tarsal tunnel is normal.   IMPRESSION: 1. Mild tendinosis and tenosynovitis of the peroneus longus. 2. Mild muscle edema in the abductor hallucis muscle and flexor digitorum brevis muscle concerning for muscle strain.     Electronically Signed   By: Kathreen Devoid M.D.   On: 09/02/2021 07:17    NCV & EMG Findings: Electrodiagnostic testing of the right lower extremity and additional studies of the left shows: Bilateral sural and superficial peroneal sensory responses are within normal limits. Bilateral peroneal and tibial motor responses are within normal limits.  Proximal tibial motor response was technically challenging due to body physiognomy.  Bilateral tibial H reflex studies are within normal limits. There is no evidence of active or chronic motor axonal changes affecting any of the tested muscles.  Motor unit configuration and recruitment pattern is within normal limits.   Impression: This is a normal study of the lower extremities.  In particular, there is no evidence of a large fiber sensorimotor  polyneuropathy or lumbosacral radiculopathy.   Assessment:   1. Idiopathic neuropathy       Plan:  Patient was evaluated and treated and all questions answered.  Still experiencing neuropathic type symptoms.  I recommend we increase her gabapentin dosing to see if this offers some relief and still treat empirically as neuropathy.  I do not see indication for new imaging such as MRI or CT of the foot currently.  Her left hallux is healing appropriately.  She will follow with me as needed  Return if symptoms worsen or fail to improve.

## 2022-12-08 NOTE — Progress Notes (Signed)
[name], DNP, AGNP-c [place], [place] [address] Main Office [phone]  BP 128/86   Pulse 70   Ht 5' 7"$  (1.702 m)   Wt 268 lb 9.6 oz (121.8 kg)   LMP 11/02/2022 (Exact Date)   BMI 42.07 kg/m    Subjective:    Patient ID: Stacey Palmer, female    DOB: 01/25/1981, 42 y.o.   MRN: [id number]  HPI: Stacey Palmer is a 42 y.o. female presenting on 12/08/2022 for comprehensive medical examination.  HPI Chelcea tells me that she recently quit her job due to the level of stress that she was facing. She tells me that she is upset that she had to make this decision, but she felt that this was the best interest. 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 She has a form to be completed today for this.   IMMUNIZATIONS:   Flu: Flu vaccine completed elsewhere this season Prevnar 13: Prevnar 13 N/A for this patient Prevnar 20: Prevnar 20 N/A for this patient Pneumovax 23: Pneumovax 23 N/A for this patient Vac Shingrix: Shingrix N/A for this patient HPV: HPV N/A for this patient Tetanus: Tetanus completed in the last 10 years  HEALTH MAINTENANCE: Pap Smear HM Status: is up to date Mammogram HM Status: is up to date Colon Cancer Screening HM Status: is up to date Bone Density HM Status: N/A STI Testing HM Status: is not applicable for this patient  She reports regular vision exams q1-5y: Yes  She reports regular dental exams q 100m  No  Mixture of healthy and unhealthy options.  She endorses exercise and/or activity of:  movement with work.   A comprehensive review of systems was negative.  Most Recent Depression Screen:     12/08/2022    2:31 PM 10/21/2022   10:36 AM 07/18/2022    8:56 AM 06/16/2022    2:31 PM 04/02/2022    4:21 PM  Depression screen PHQ 2/9  Decreased Interest 0 0 0 1 0  Down, Depressed, Hopeless 0 0 1 1 2  $ PHQ - 2 Score 0 0 1 2 2  $ Altered sleeping   0 0 2  Tired,  decreased energy   1 1 2  $ Change in appetite   0 1 0  Feeling bad or failure about yourself    0 0 2  Trouble concentrating   0 0 0  Moving slowly or fidgety/restless   0 0 0  Suicidal thoughts   0 0 0  PHQ-9 Score   2 4 8  $ Difficult doing work/chores   Not difficult at all     Most Recent Anxiety Screen:     06/16/2022    2:33 PM 04/02/2022    4:21 PM 01/28/2022    3:52 PM 12/18/2020    4:30 PM  GAD 7 : Generalized Anxiety Score  Nervous, Anxious, on Edge 0 0 0   Control/stop worrying 0 0 0   Worry too much - different things 0 2 0   Trouble relaxing 1 0 0   Restless 0 0 0   Easily annoyed or irritable 0 0 0   Afraid - awful might happen 0 0 0   Total GAD 7 Score 1 2 0      Information is confidential and restricted. Go to Review Flowsheets to unlock data.   Most Recent Fall Screen:    12/08/2022  2:30 PM 12/02/2022    3:41 PM 10/21/2022   10:36 AM 07/18/2022    8:56 AM 06/24/2022   11:03 AM  Fall Risk   Falls in the past year? 0 0 0 0 0  Number falls in past yr: 0 0 0 0 0  Injury with Fall? 0 0 0 0 0  Risk for fall due to : No Fall Risks No Fall Risks No Fall Risks No Fall Risks   Follow up Falls evaluation completed Falls evaluation completed Falls evaluation completed Falls evaluation completed;Education provided     Past medical history, surgical history, medications, allergies, family history and social history reviewed with patient today and changes made to appropriate areas of the chart.  Past Medical History:  Past Medical History:  Diagnosis Date   Abdominal cramps 12/16/2020   Allergic rhinitis 06/05/2014   Allergy    Anxiety    Depression, major, single episode, in partial remission ([place]) 12/26/2018   pHQ 9 SCORE OF 12 IN 12/2018, score of 0 in 03/2019 on medication   Dry eyes 12/26/2018   Encounter for gynecological examination with Papanicolaou smear of cervix 01/28/2022   Encounter for screening fecal occult blood testing 01/28/2022   Family planning  01/28/2022   Fatigue 12/06/2019   Feeling sad 01/28/2022   H/O gastric sleeve 2018   Hair loss 12/06/2019   History of mammogram 2021   Obesity    Obesity (BMI 30-39.9) 06/11/2015   S/p bariatric surgery in 2017   Plantar fasciitis    Pregnancy examination or test, negative result 01/28/2022   Skin tag 06/05/2014   Sleep apnea    URI (upper respiratory infection) 08/23/2012   Vaginal irritation 09/18/2020   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   azelastine (OPTIVAR) 0.05 % ophthalmic solution Apply 1 drop to eye 2 (two) times daily.   gabapentin (NEURONTIN) 300 MG capsule Take 1 capsule (300 mg total) by mouth 2 (two) times daily.   levocetirizine (XYZAL) 5 MG tablet Take 1 tablet (5 mg total) by mouth every evening.   phentermine (ADIPEX-P) 37.5 MG tablet Take 1 tablet (37.5 mg total) by mouth daily before breakfast.   sulfamethoxazole-trimethoprim (BACTRIM DS) 800-160 MG tablet Take 1 tablet by mouth 2 (two) times daily.   Vitamin D, Cholecalciferol, 25 MCG (1000 UT) CAPS Take 1 capsule by mouth daily.   No current facility-administered medications on file prior to visit.   Surgical History:  Past Surgical History:  Procedure Laterality Date   BARIATRIC SURGERY  2018   CESAREAN SECTION     TUBAL LIGATION     Allergies:  No Known Allergies Family History:  Family History  Problem Relation Age of Onset   Hypertension Mother    Kidney disease Father    Diabetes Father    Breast cancer Paternal Aunt    Dementia Paternal Grandmother    Asthma Daughter        Objective:    BP 128/86   Pulse 70   Ht 5' 7"$  (1.702 m)   Wt 268 lb 9.6 oz (121.8 kg)   LMP 11/02/2022 (Exact Date)   BMI 42.07 kg/m   Wt Readings from Last 3 Encounters:  12/08/22 268 lb  9.6 oz (121.8 kg)  12/02/22 270 lb 3.2 oz (122.6 kg)  10/21/22 269 lb 12.8 oz (122.4 kg)    Physical Exam Vitals and nursing note reviewed.  Constitutional:      General: She is not in acute distress.    Appearance: Normal appearance. She is obese.  HENT:     Head: Normocephalic and atraumatic.     Right Ear: Hearing, tympanic membrane, ear canal and external ear normal.     Left Ear: Hearing, tympanic membrane, ear canal and external ear normal.     Nose: Nose normal.     Right Sinus: No maxillary sinus tenderness or frontal sinus tenderness.     Left Sinus: No maxillary sinus tenderness or frontal sinus tenderness.     Mouth/Throat:     Lips: Pink.     Mouth: Mucous membranes are moist.     Pharynx: Oropharynx is clear.  Eyes:     General: Lids are normal. Vision grossly intact.     Extraocular Movements: Extraocular movements intact.     Conjunctiva/sclera: Conjunctivae normal.     Pupils: Pupils are equal, round, and reactive to light.     Funduscopic exam:    Right eye: Red reflex present.        Left eye: Red reflex present.    Visual Fields: Right eye visual fields normal and left eye visual fields normal.  Neck:     Thyroid: No thyromegaly.     Vascular: No carotid bruit.  Cardiovascular:     Rate and Rhythm: Normal rate and regular rhythm.     Chest Wall: PMI is not displaced.     Pulses: Normal pulses.          Dorsalis pedis pulses are 2+ on the right side and 2+ on the left side.       Posterior tibial pulses are 2+ on the right side and 2+ on the left side.     Heart sounds: Normal heart sounds. No murmur heard. Pulmonary:     Effort: Pulmonary effort is normal. No respiratory distress.     Breath sounds: Normal breath sounds.  Abdominal:     General: Abdomen is flat. Bowel sounds are normal. There is no distension.     Palpations: Abdomen is soft. There is no hepatomegaly, splenomegaly or mass.     Tenderness: There is no abdominal tenderness. There is no  right CVA tenderness, left CVA tenderness, guarding or rebound.  Musculoskeletal:        General: Normal range of motion.     Cervical back: Full passive range of motion without pain, normal range of motion and neck supple. No tenderness.     Right lower leg: No edema.     Left lower leg: No edema.  Feet:     Left foot:     Toenail Condition: Left toenails are normal.  Lymphadenopathy:     Cervical: No cervical adenopathy.     Upper Body:     Right upper body: No supraclavicular adenopathy.     Left upper body: No supraclavicular adenopathy.  Skin:    General: Skin is warm and dry.     Capillary Refill: Capillary refill takes less than 2 seconds.     Nails: There is no clubbing.  Neurological:     General: No focal deficit present.     Mental Status: She is alert and oriented to person, place, and time.     GCS: GCS eye subscore is 4. GCS verbal subscore is  5. GCS motor subscore is 6.     Sensory: Sensation is intact.     Motor: Motor function is intact.     Coordination: Coordination is intact.     Gait: Gait is intact.     Deep Tendon Reflexes: Reflexes are normal and symmetric.  Psychiatric:        Attention and Perception: Attention normal.        Mood and Affect: Mood normal.        Speech: Speech normal.        Behavior: Behavior normal. Behavior is cooperative.        Thought Content: Thought content normal.        Cognition and Memory: Cognition and memory normal.        Judgment: Judgment normal.     Results for orders placed or performed in visit on 12/08/22  CBC with Differential/Platelet  Result Value Ref Range   WBC 4.5 3.4 - 10.8 x10E3/uL   RBC 4.08 3.77 - 5.28 x10E6/uL   Hemoglobin 12.7 11.1 - 15.9 g/dL   Hematocrit 37.4 34.0 - 46.6 %   MCV 92 79 - 97 fL   MCH 31.1 26.6 - 33.0 pg   MCHC 34.0 31.5 - 35.7 g/dL   RDW 12.5 11.7 - 15.4 %   Platelets 314 150 - 450 x10E3/uL   Neutrophils 45 Not Estab. %   Lymphs 44 Not Estab. %   Monocytes 8 Not Estab. %    Eos 2 Not Estab. %   Basos 1 Not Estab. %   Neutrophils Absolute 2.0 1.4 - 7.0 x10E3/uL   Lymphocytes Absolute 2.0 0.7 - 3.1 x10E3/uL   Monocytes Absolute 0.4 0.1 - 0.9 x10E3/uL   EOS (ABSOLUTE) 0.1 0.0 - 0.4 x10E3/uL   Basophils Absolute 0.0 0.0 - 0.2 x10E3/uL   Immature Granulocytes 0 Not Estab. %   Immature Grans (Abs) 0.0 0.0 - 0.1 x10E3/uL  Comprehensive metabolic panel  Result Value Ref Range   Glucose 80 70 - 99 mg/dL   BUN 10 6 - 24 mg/dL   Creatinine, Ser 0.81 0.57 - 1.00 mg/dL   eGFR 92 >59 mL/min/1.73   BUN/Creatinine Ratio 12 9 - 23   Sodium 139 134 - 144 mmol/L   Potassium 4.4 3.5 - 5.2 mmol/L   Chloride 101 96 - 106 mmol/L   CO2 26 20 - 29 mmol/L   Calcium 9.6 8.7 - 10.2 mg/dL   Total Protein 7.2 6.0 - 8.5 g/dL   Albumin 4.2 3.9 - 4.9 g/dL   Globulin, Total 3.0 1.5 - 4.5 g/dL   Albumin/Globulin Ratio 1.4 1.2 - 2.2   Bilirubin Total 0.6 0.0 - 1.2 mg/dL   Alkaline Phosphatase 69 44 - 121 IU/L   AST 13 0 - 40 IU/L   ALT 10 0 - 32 IU/L  Hemoglobin A1c  Result Value Ref Range   Hgb A1c MFr Bld 5.4 4.8 - 5.6 %   Est. average glucose Bld gHb Est-mCnc 108 mg/dL  Lipid panel  Result Value Ref Range   Cholesterol, Total 215 (H) 100 - 199 mg/dL   Triglycerides 87 0 - 149 mg/dL   HDL 64 >39 mg/dL   VLDL Cholesterol Cal 15 5 - 40 mg/dL   LDL Chol Calc (NIH) 136 (H) 0 - 99 mg/dL   Chol/HDL Ratio 3.4 0.0 - 4.4 ratio  T4, free  Result Value Ref Range   Free T4 1.21 0.82 - 1.77 ng/dL  TSH  Result Value  Ref Range   TSH 1.900 0.450 - 4.500 uIU/mL  VITAMIN D 25 Hydroxy (Vit-D Deficiency, Fractures)  Result Value Ref Range   Vit D, 25-Hydroxy 22.8 (L) 30.0 - 100.0 ng/mL  QuantiFERON-TB Gold Plus  Result Value Ref Range   QuantiFERON Incubation WILL FOLLOW    QuantiFERON-TB Gold Plus WILL FOLLOW   QuantiFERON-TB Gold Plus  Result Value Ref Range   QuantiFERON Incubation Incubation performed.    QuantiFERON Criteria Comment    QuantiFERON TB1 Ag Value 0.04 IU/mL    QuantiFERON TB2 Ag Value 0.05 IU/mL   QuantiFERON Nil Value 0.05 IU/mL   QuantiFERON Mitogen Value >10.00 IU/mL   QuantiFERON-TB Gold Plus Negative Negative         Assessment & Plan:   Problem List Items Addressed This Visit     Morbid obesity with BMI of 40.0-44.9, adult ([place])    Chronic concerns with obesity. We have discussed the option of starting Wegovy in the past, but due to the shortage we have not been able to start this. I have strongly encouraged she work on monitoring her diet and starting exercise. Exercise by walking 15 minutes once a day and increasing to twice a day after 1 week. Given the recent change in her insurance, I am not sure if she will still qualify for [facility], but we can look into that after diet and exercise management have been implemented.       Encounter for annual physical exam - Primary    CPE today with no abnormalities noted on exam.  Labs pending. Will make changes as necessary based on results.  Review of HM activities and recommendations discussed and provided on AVS Anticipatory guidance, diet, and exercise recommendations provided.  Medications, allergies, and hx reviewed and updated as necessary.  Plan to f/u with CPE in 1 year or sooner for acute/chronic health needs as directed.        Relevant Orders   CBC with Differential/Platelet (Completed)   Comprehensive metabolic panel (Completed)   Hemoglobin A1c (Completed)   Lipid panel (Completed)   T4, free (Completed)   TSH (Completed)   VITAMIN D 25 Hydroxy (Vit-D Deficiency, Fractures) (Completed)   QuantiFERON-TB Gold Plus (Completed)   Recurrent major depressive disorder, in partial remission ([place])    History of depression. At this time she denies any concerns and tells me that quitting her position has helped with her mental health overall. We will continue to monitor closely. She is aware to contact me if she feels like her symptoms are worsening or new symptoms present.        Other Visit Diagnoses     Health care maintenance       Relevant Orders   CBC with Differential/Platelet (Completed)   Comprehensive metabolic panel (Completed)   Hemoglobin A1c (Completed)   Lipid panel (Completed)   T4, free (Completed)   TSH (Completed)   VITAMIN D 25 Hydroxy (Vit-D Deficiency, Fractures) (Completed)          Follow up plan: Return in about 1 year (around 12/09/2023) for CPE.  NEXT PREVENTATIVE PHYSICAL DUE IN 1 YEAR.  PATIENT COUNSELING PROVIDED FOR ALL ADULT PATIENTS:  Consume a well balanced diet low in saturated fats, cholesterol, and moderation in carbohydrates.   This can be as simple as monitoring portion sizes and cutting back on sugary beverages such as soda and juice to start with.    Daily water consumption of at least 64 ounces.  Physical activity at least 180  minutes per week, if just starting out.   This can be as simple as taking the stairs instead of the elevator and walking 2-3 laps around the office  purposefully every day.   STD protection, partner selection, and regular testing if high risk.  Limited consumption of alcoholic beverages if alcohol is consumed.  For women, I recommend no more than 7 alcoholic beverages per week, spread out throughout the week.  Avoid "binge" drinking or consuming large quantities of alcohol in one setting.   Please let me know if you feel you may need help with reduction or quitting alcohol consumption.   Avoidance of nicotine, if used.  Please let me know if you feel you may need help with reduction or quitting nicotine use.   Daily mental health attention.  This can be in the form of 5 minute daily meditation, prayer, journaling, yoga, reflection, etc.   Purposeful attention to your emotions and mental state can significantly improve your overall wellbeing and Health.  Please know that I am here to help you with all of your health care goals and am happy to work with you to find a solution that  works best for you.  The greatest advice I have received with any changes in life are to take it one step at a time, that even means if all you can focus on is the next 60 seconds, then do that and celebrate your victories.  With any changes in life, you will have set backs, and that is OK. The important thing to remember is, if you have a set back, it is not a failure, it is an opportunity to try again!  Health Maintenance Recommendations Screening Testing Mammogram Every 1 -2 years based on history and risk factors Starting at age 40 Pap Smear Ages 21-39 every 3 years Ages 63-65 every 5 years with HPV testing More frequent testing may be required based on results and history Colon Cancer Screening Every 1-10 years based on test performed, risk factors, and history Starting at age 58 Bone Density Screening Every 2-10 years based on history Starting at age 10 for women Recommendations for men differ based on medication usage, history, and risk factors AAA Screening One time ultrasound Men 32-67 years old who have every smoked Lung Cancer Screening Low Dose Lung CT every 12 months Age 64-80 years with a 30 pack-year smoking history who still smoke or who have quit within the last 15 years  Screening Labs Routine  Labs: Complete Blood Count (CBC), Complete Metabolic Panel (CMP), Cholesterol (Lipid Panel) Every 6-12 months based on history and medications May be recommended more frequently based on current conditions or previous results Hemoglobin A1c Lab Every 3-12 months based on history and previous results Starting at age 63 or earlier with diagnosis of diabetes, high cholesterol, BMI >26, and/or risk factors Frequent monitoring for patients with diabetes to ensure blood sugar control Thyroid Panel (TSH w/ T3 & T4) Every 6 months based on history, symptoms, and risk factors May be repeated more often if on medication HIV One time testing for all patients 23 and older May be  repeated more frequently for patients with increased risk factors or exposure Hepatitis C One time testing for all patients 43 and older May be repeated more frequently for patients with increased risk factors or exposure Gonorrhea, Chlamydia Every 12 months for all sexually active persons 13-24 years Additional monitoring may be recommended for those who are considered high risk or who have symptoms  PSA Men 4-30 years old with risk factors Additional screening may be recommended from age 44-69 based on risk factors, symptoms, and history  Vaccine Recommendations Tetanus Booster All adults every 10 years Flu Vaccine All patients 6 months and older every year COVID Vaccine All patients 12 years and older Initial dosing with booster May recommend additional booster based on age and health history HPV Vaccine 2 doses all patients age 36-26 Dosing may be considered for patients over 26 Shingles Vaccine (Shingrix) 2 doses all adults 30 years and older Pneumonia (Pneumovax 23) All adults 59 years and older May recommend earlier dosing based on health history Pneumonia (Prevnar 56) All adults 46 years and older Dosed 1 year after Pneumovax 23  Additional Screening, Testing, and Vaccinations may be recommended on an individualized basis based on family history, health history, risk factors, and/or exposure.

## 2022-12-09 ENCOUNTER — Other Ambulatory Visit: Payer: 59

## 2022-12-09 DIAGNOSIS — Z0000 Encounter for general adult medical examination without abnormal findings: Secondary | ICD-10-CM | POA: Diagnosis not present

## 2022-12-09 LAB — LIPID PANEL

## 2022-12-10 LAB — COMPREHENSIVE METABOLIC PANEL
ALT: 10 IU/L (ref 0–32)
AST: 13 IU/L (ref 0–40)
Albumin/Globulin Ratio: 1.4 (ref 1.2–2.2)
Albumin: 4.2 g/dL (ref 3.9–4.9)
Alkaline Phosphatase: 69 IU/L (ref 44–121)
BUN/Creatinine Ratio: 12 (ref 9–23)
BUN: 10 mg/dL (ref 6–24)
Bilirubin Total: 0.6 mg/dL (ref 0.0–1.2)
CO2: 26 mmol/L (ref 20–29)
Calcium: 9.6 mg/dL (ref 8.7–10.2)
Chloride: 101 mmol/L (ref 96–106)
Creatinine, Ser: 0.81 mg/dL (ref 0.57–1.00)
Globulin, Total: 3 g/dL (ref 1.5–4.5)
Glucose: 80 mg/dL (ref 70–99)
Potassium: 4.4 mmol/L (ref 3.5–5.2)
Sodium: 139 mmol/L (ref 134–144)
Total Protein: 7.2 g/dL (ref 6.0–8.5)
eGFR: 92 mL/min/{1.73_m2} (ref >59)

## 2022-12-10 LAB — CBC WITH DIFFERENTIAL/PLATELET
Basophils Absolute: 0 10*3/uL (ref 0.0–0.2)
Basos: 1 %
EOS (ABSOLUTE): 0.1 10*3/uL (ref 0.0–0.4)
Eos: 2 %
Hematocrit: 37.4 % (ref 34.0–46.6)
Hemoglobin: 12.7 g/dL (ref 11.1–15.9)
Immature Grans (Abs): 0 10*3/uL (ref 0.0–0.1)
Immature Granulocytes: 0 %
Lymphocytes Absolute: 2 10*3/uL (ref 0.7–3.1)
Lymphs: 44 %
MCH: 31.1 pg (ref 26.6–33.0)
MCHC: 34 g/dL (ref 31.5–35.7)
MCV: 92 fL (ref 79–97)
Monocytes Absolute: 0.4 10*3/uL (ref 0.1–0.9)
Monocytes: 8 %
Neutrophils Absolute: 2 10*3/uL (ref 1.4–7.0)
Neutrophils: 45 %
Platelets: 314 10*3/uL (ref 150–450)
RBC: 4.08 x10E6/uL (ref 3.77–5.28)
RDW: 12.5 % (ref 11.7–15.4)
WBC: 4.5 10*3/uL (ref 3.4–10.8)

## 2022-12-10 LAB — LIPID PANEL
Chol/HDL Ratio: 3.4 ratio (ref 0.0–4.4)
Cholesterol, Total: 215 mg/dL — ABNORMAL HIGH (ref 100–199)
HDL: 64 mg/dL (ref >39)
LDL Chol Calc (NIH): 136 mg/dL — ABNORMAL HIGH (ref 0–99)
Triglycerides: 87 mg/dL (ref 0–149)
VLDL Cholesterol Cal: 15 mg/dL (ref 5–40)

## 2022-12-10 LAB — VITAMIN D 25 HYDROXY (VIT D DEFICIENCY, FRACTURES): Vit D, 25-Hydroxy: 22.8 ng/mL — ABNORMAL LOW (ref 30.0–100.0)

## 2022-12-10 LAB — HEMOGLOBIN A1C
Est. average glucose Bld gHb Est-mCnc: 108 mg/dL
Hgb A1c MFr Bld: 5.4 % (ref 4.8–5.6)

## 2022-12-10 LAB — TSH: TSH: 1.9 u[IU]/mL (ref 0.450–4.500)

## 2022-12-10 LAB — T4, FREE: Free T4: 1.21 ng/dL (ref 0.82–1.77)

## 2022-12-11 NOTE — Assessment & Plan Note (Signed)
UA positive in the office today despite antibiotic therapy. Unclear if culture was sent before. Concern is present for recurrent UTI, she may not be responding to antibiotics or there may be other factors that are causing these. Recommend she increase water intake and not hold urine. Wipe from front to back. Will start new antibiotic to see if we can get different coverage. If UTI/dysuria continues she will need referral to urology.

## 2022-12-13 DIAGNOSIS — F3341 Major depressive disorder, recurrent, in partial remission: Secondary | ICD-10-CM | POA: Insufficient documentation

## 2022-12-13 NOTE — Assessment & Plan Note (Signed)
Chronic concerns with obesity. We have discussed the option of starting Wegovy in the past, but due to the shortage we have not been able to start this. I have strongly encouraged she work on monitoring her diet and starting exercise. Exercise by walking 15 minutes once a day and increasing to twice a day after 1 week. Given the recent change in her insurance, I am not sure if she will still qualify for [facility], but we can look into that after diet and exercise management have been implemented.

## 2022-12-13 NOTE — Assessment & Plan Note (Signed)
History of depression. At this time she denies any concerns and tells me that quitting her position has helped with her mental health overall. We will continue to monitor closely. She is aware to contact me if she feels like her symptoms are worsening or new symptoms present.

## 2022-12-13 NOTE — Assessment & Plan Note (Signed)

## 2022-12-15 ENCOUNTER — Telehealth: Payer: Self-pay | Admitting: Nurse Practitioner

## 2022-12-15 ENCOUNTER — Other Ambulatory Visit: Payer: Self-pay

## 2022-12-15 DIAGNOSIS — Z789 Other specified health status: Secondary | ICD-10-CM

## 2022-12-15 NOTE — Telephone Encounter (Signed)
Pt called about picking up physical form.

## 2022-12-16 ENCOUNTER — Other Ambulatory Visit: Payer: 59

## 2022-12-16 ENCOUNTER — Telehealth: Payer: Self-pay | Admitting: Nurse Practitioner

## 2022-12-16 DIAGNOSIS — Z789 Other specified health status: Secondary | ICD-10-CM | POA: Diagnosis not present

## 2022-12-16 DIAGNOSIS — E559 Vitamin D deficiency, unspecified: Secondary | ICD-10-CM

## 2022-12-16 MED ORDER — VITAMIN D (ERGOCALCIFEROL) 1.25 MG (50000 UNIT) PO CAPS
50000.0000 [IU] | ORAL_CAPSULE | ORAL | 0 refills | Status: DC
  Filled 2022-12-16: qty 4, 28d supply, fill #0

## 2022-12-16 NOTE — Telephone Encounter (Signed)
Pt came in for labs and states that she is not taking vitamin D and needs it sent in for her. Please send to [facility] out pt pharmacy.

## 2022-12-17 ENCOUNTER — Other Ambulatory Visit (HOSPITAL_COMMUNITY): Payer: Self-pay

## 2022-12-17 ENCOUNTER — Other Ambulatory Visit: Payer: Self-pay

## 2022-12-17 LAB — MEASLES/MUMPS/RUBELLA IMMUNITY
MUMPS ABS, IGG: 212 AU/mL (ref >10.9)
RUBEOLA AB, IGG: 152 AU/mL (ref >16.4)
Rubella Antibodies, IGG: 14.1 index (ref >0.99)

## 2022-12-18 ENCOUNTER — Other Ambulatory Visit (HOSPITAL_COMMUNITY): Payer: Self-pay

## 2022-12-19 NOTE — Telephone Encounter (Signed)
Results all negative. Form completed and ready to be picked up

## 2022-12-26 LAB — QUANTIFERON-TB GOLD PLUS
QuantiFERON Mitogen Value: >10 IU/mL
QuantiFERON Nil Value: 0.05 IU/mL
QuantiFERON TB1 Ag Value: 0.04 IU/mL
QuantiFERON TB2 Ag Value: 0.05 IU/mL
QuantiFERON-TB Gold Plus: NEGATIVE

## 2022-12-26 LAB — REFERENCE MICRO PROBLEM TEST

## 2023-01-01 ENCOUNTER — Encounter: Payer: Self-pay | Admitting: Radiology

## 2023-01-09 ENCOUNTER — Other Ambulatory Visit (HOSPITAL_COMMUNITY): Payer: Self-pay

## 2023-01-09 MED ORDER — FLUCONAZOLE 150 MG PO TABS
150.0000 mg | ORAL_TABLET | Freq: Every day | ORAL | 0 refills | Status: DC
  Filled 2023-01-09: qty 2, 2d supply, fill #0

## 2023-01-09 MED ORDER — AMOXICILLIN 500 MG PO CAPS
500.0000 mg | ORAL_CAPSULE | Freq: Three times a day (TID) | ORAL | 0 refills | Status: DC
  Filled 2023-01-09: qty 21, 7d supply, fill #0

## 2023-01-12 ENCOUNTER — Other Ambulatory Visit (HOSPITAL_COMMUNITY): Payer: Self-pay

## 2023-01-20 ENCOUNTER — Ambulatory Visit: Payer: 59 | Admitting: Nurse Practitioner

## 2023-01-28 ENCOUNTER — Encounter (INDEPENDENT_AMBULATORY_CARE_PROVIDER_SITE_OTHER): Payer: 59 | Admitting: Family Medicine

## 2023-02-05 ENCOUNTER — Encounter: Payer: Self-pay | Admitting: Nurse Practitioner

## 2023-02-05 ENCOUNTER — Ambulatory Visit (INDEPENDENT_AMBULATORY_CARE_PROVIDER_SITE_OTHER): Payer: 59 | Admitting: Nurse Practitioner

## 2023-02-05 VITALS — BP 122/88 | HR 84 | Wt 272.2 lb

## 2023-02-05 DIAGNOSIS — F3341 Major depressive disorder, recurrent, in partial remission: Secondary | ICD-10-CM

## 2023-02-05 DIAGNOSIS — G629 Polyneuropathy, unspecified: Secondary | ICD-10-CM

## 2023-02-05 DIAGNOSIS — H04123 Dry eye syndrome of bilateral lacrimal glands: Secondary | ICD-10-CM | POA: Diagnosis not present

## 2023-02-05 DIAGNOSIS — E559 Vitamin D deficiency, unspecified: Secondary | ICD-10-CM | POA: Diagnosis not present

## 2023-02-05 DIAGNOSIS — J3089 Other allergic rhinitis: Secondary | ICD-10-CM

## 2023-02-05 DIAGNOSIS — E782 Mixed hyperlipidemia: Secondary | ICD-10-CM

## 2023-02-05 DIAGNOSIS — Z6841 Body Mass Index (BMI) 40.0 and over, adult: Secondary | ICD-10-CM | POA: Diagnosis not present

## 2023-02-05 DIAGNOSIS — U071 COVID-19: Secondary | ICD-10-CM

## 2023-02-05 MED ORDER — LEVOCETIRIZINE DIHYDROCHLORIDE 5 MG PO TABS: 5.0000 mg | ORAL_TABLET | Freq: Every evening | ORAL | 3 refills | Status: AC

## 2023-02-05 MED ORDER — WEGOVY 0.25 MG/0.5ML ~~LOC~~ SOAJ: 0.2500 mg | SUBCUTANEOUS | 0 refills | Status: DC

## 2023-02-05 MED ORDER — VITAMIN D (ERGOCALCIFEROL) 1.25 MG (50000 UNIT) PO CAPS: 50000.0000 [IU] | ORAL_CAPSULE | ORAL | 0 refills | Status: DC

## 2023-02-05 MED ORDER — AZELASTINE HCL 0.05 % OP SOLN: 1.0000 [drp] | Freq: Two times a day (BID) | OPHTHALMIC | 6 refills | Status: DC

## 2023-02-05 NOTE — Progress Notes (Signed)
Shawna Clamp, DNP, AGNP-c [facility]  [address] [place], [place] [address] [phone]  ESTABLISHED PATIENT- Chronic Health and/or Follow-Up Visit  Blood pressure 122/88, pulse 84, weight 272 lb 3.2 oz (123.5 kg), last menstrual period 02/04/2023.    Stacey Palmer is a 42 y.o. year old female presenting today for evaluation and management of chronic conditions.   Stacey Palmer presents today with concerns about weight loss. She has previous tried medications, but her current insurance will not cover these. She has a strong desire to lose weight and is working to avoid unhealthy snacks. She is trying to also encourage healthy eating habits in the household to also help her daughter. Stacey Palmer expresses a commitment to following health recommendations, specifically committing to a 20-minute daily walk to improve her health. She underwent weight loss surgery in 2017 and successfully lost 97lbs but has since regained a significant amount of that weight. She attributes the weight gain to mindless eating due to boredom or stress and expresses confusion about how she has gained so much weight despite eating small portions.   She has been dealing with stress and disappointment recently as she has worked to secure a job back within [employer]. She recently found the position she was interested in had already been filled. 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   Additionally, Stacey Palmer reports experiencing intermittent pain in her feet, questioning whether it is related to neuropathy. She describes the pain as occurring in the mornings and afternoons but is unsure if it correlates with back pain. The pain is intermittent and not burning or shooting, but more of a dullness.   All ROS negative with exception of what is listed above.   PHYSICAL EXAM Physical Exam Vitals and nursing note reviewed.  Constitutional:      General: She is not in acute  distress.    Appearance: Normal appearance. She is obese. She is not ill-appearing.  HENT:     Head: Normocephalic.  Eyes:     Extraocular Movements: Extraocular movements intact.     Conjunctiva/sclera: Conjunctivae normal.     Pupils: Pupils are equal, round, and reactive to light.  Neck:     Vascular: No carotid bruit.  Cardiovascular:     Rate and Rhythm: Normal rate and regular rhythm.     Pulses: Normal pulses.     Heart sounds: Normal heart sounds. No murmur heard. Pulmonary:     Effort: Pulmonary effort is normal.     Breath sounds: Normal breath sounds. No wheezing.  Abdominal:     General: Bowel sounds are normal. There is no distension.     Palpations: Abdomen is soft.     Tenderness: There is no abdominal tenderness. There is no guarding.  Musculoskeletal:        General: Normal range of motion.     Cervical back: Normal range of motion and neck supple.     Right lower leg: No edema.     Left lower leg: No edema.  Lymphadenopathy:     Cervical: No cervical adenopathy.  Skin:    General: Skin is warm and dry.     Capillary Refill: Capillary refill takes less than 2 seconds.  Neurological:     General: No focal deficit present.     Mental Status: She is alert and oriented to person, place, and time.     Sensory: No sensory deficit.     Motor: No weakness.  Gait: Gait normal.  Psychiatric:        Mood and Affect: Mood normal.        Behavior: Behavior normal.     PLAN Problem List Items Addressed This Visit     Morbid obesity with BMI of 40.0-44.9, adult - Primary    Weight loss surgery in 2017 and lost 97 pounds but has since regained weight. She is motivated to lose weight and plans to commit to a 20-minute walk daily. We also discussed the importance of eating habits and portion control.  Plan: - Encourage the patient to continue with healthy eating habits and regular exercise. - Consider referral to a weight loss specialist or bariatric surgeon for  evaluation and possible revision of weight loss surgery.       Relevant Medications   Semaglutide-Weight Management (WEGOVY) 0.25 MG/0.5ML SOAJ   Vitamin D deficiency    Previous vitamin D supplementation with increase in mood and energy levels reported. She would like to restart on these today. Will send medication to the pharmacy .      Relevant Medications   Vitamin D, Ergocalciferol, (DRISDOL) 1.25 MG (50000 UNIT) CAPS capsule   Chronic dryness of both eyes    Chronic dry eye well controlled with azelastine eye drops. Refills needed.       Relevant Medications   azelastine (OPTIVAR) 0.05 % ophthalmic solution   Environmental and seasonal allergies    Seasonal allergies with exacerbation of dry eyes and congestion present. Refill provided on azelastine eye drops. Will plan to start on xyzal at bedtime to help with symptoms.       Relevant Medications   levocetirizine (XYZAL) 5 MG tablet   Recurrent major depressive disorder, in partial remission    The patient has had a letdown recently with inability to regain her employment with [employer], however, she is managing very well emotionally. We discussed monitoring for new positions and options that may be available. No signs of worsening depression are present at this time.       Mixed hyperlipidemia    Chronic. Not currently on statin therapy. Previously on wegovy and working on [employer], but insurance no longer covers. We discussed specifics of diet and exercise that need to be followed today and recommendations to get started.       Relevant Medications   Semaglutide-Weight Management (WEGOVY) 0.25 MG/0.5ML SOAJ    Return in about 5 months (around 07/08/2023) for Med Management & Labs.   Shawna Clamp, DNP, AGNP-c 02/05/2023  3:16 PM

## 2023-02-05 NOTE — Patient Instructions (Addendum)
Goal: Walk 20 minutes EVERY day. You CAN do it!!    WEIGHT LOSS PLANNING Your progress today shows:     02/05/2023    3:12 PM 12/08/2022    2:31 PM 12/02/2022    3:36 PM  Vitals with BMI  Height  5\' 7"  5\' 7"   Weight 272 lbs 3 oz 268 lbs 10 oz 270 lbs 3 oz  BMI  [id number]  Systolic [id number]  Diastolic 88 86 86  Pulse 84 70 68    For best management of weight, it is vital to balance intake versus output. This means the number of calories burned per day must be less than the calories you take in with food and drink.   I recommend trying to follow a diet with the following: Calories: 1200-1500 calories per day Carbohydrates: 150-180 grams of carbohydrates per day  Why: Gives your body enough "quick fuel" for cells to maintain normal function without sending them into starvation mode.  Protein: At least 90 grams of protein per day- 30 grams with each meal Why: Protein takes longer and uses more energy than carbohydrates to break down for fuel. The carbohydrates in your meals serves as quick energy sources and proteins help use some of that extra quick energy to break down to produce long term energy. This helps you not feel hungry as quickly and protein breakdown burns calories.  Water: Drink AT LEAST 64 ounces of water per day  Why: Water is essential to healthy metabolism. Water helps to fill the stomach and keep you fuller longer. Water is required for healthy digestion and filtering of waste in the body.  Fat: Limit fats in your diet- when choosing fats, choose foods with lower fats content such as lean meats (chicken, fish, [place]).  Why: Increased fat intake leads to storage "for later". Once you burn your carbohydrate energy, your body goes into fat and protein breakdown mode to help you loose weight.  Cholesterol: Fats and oils that are LIQUID at room temperature are best. Choose vegetable oils (olive oil, avocado oil, nuts). Avoid fats that are SOLID at room temperature (animal  fats, processed meats). Healthy fats are often found in whole grains, beans, nuts, seeds, and berries.  Why: Elevated cholesterol levels lead to build up of cholesterol on the inside of your blood vessels. This will eventually cause the blood vessels to become hard and can lead to high blood pressure and damage to your organs. When the blood flow is reduced, but the pressure is high from cholesterol buildup, parts of the cholesterol can break off and form clots that can go to the brain or heart leading to a stroke or heart attack.  Fiber: Increase amount of SOLUBLE the fiber in your diet. This helps to fill you up, lowers cholesterol, and helps with digestion. Some foods high in soluble fiber are oats, peas, beans, apples, carrots, barley, and citrus fruits.   Why: Fiber fills you up, helps remove excess cholesterol, and aids in healthy digestion which are all very important in weight management.   I recommend the following as a minimum activity routine: Purposeful walk or other physical activity at least 20 minutes every single day. This means purposefully taking a walk, jog, bike, swim, treadmill, elliptical, dance, etc.  This activity should be ABOVE your normal daily activities, such as walking at work. Goal exercise should be at least 150 minutes a week- work your way up to this.   Heart Rate: Your maximum exercise  heart rate should be 220 - Your Age in Years. When exercising, get your heart rate up, but avoid going over the maximum targeted heart rate.  60-70% of your maximum heart rate is where you tend to burn the most fat. To find this number:  220 - Age In Years= Max HR  Max HR x 0.6 (or 0.7) = Fat Burning HR The Fat Burning HR is your goal heart rate while working out to burn the most fat.  NEVER exercise to the point your feel lightheaded, weak, nauseated, dizzy. If you experience ANY of these symptoms- STOP exercise! Allow yourself to cool down and your heart rate to come down. Then  restart slower next time.  If at [place] you feel chest pain or chest pressure during exercise, STOP IMMEDIATELY and seek medical attention.

## 2023-02-11 ENCOUNTER — Encounter (INDEPENDENT_AMBULATORY_CARE_PROVIDER_SITE_OTHER): Payer: 59 | Admitting: Family Medicine

## 2023-02-13 NOTE — Assessment & Plan Note (Signed)
Seasonal allergies with exacerbation of dry eyes and congestion present. Refill provided on azelastine eye drops. Will plan to start on xyzal at bedtime to help with symptoms.

## 2023-02-13 NOTE — Assessment & Plan Note (Signed)
Intermittent pain in her feet does not appear to be related to neuropathic causes. 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 Recommend continue gabapentin for neuropathic pain. Monitor feet closely for injury or sores. Wear well fitting well supportive shoes when on your feet.

## 2023-02-13 NOTE — Assessment & Plan Note (Signed)
Chronic. Not currently on statin therapy. Previously on wegovy and working on [employer], but insurance no longer covers. We discussed specifics of diet and exercise that need to be followed today and recommendations to get started.

## 2023-02-13 NOTE — Assessment & Plan Note (Signed)
The patient has had a letdown recently with inability to regain her employment with [employer], however, she is managing very well emotionally. We discussed monitoring for new positions and options that may be available. No signs of worsening depression are present at this time.

## 2023-02-13 NOTE — Assessment & Plan Note (Signed)
Weight loss surgery in 2017 and lost 97 pounds but has since regained weight. She is motivated to lose weight and plans to commit to a 20-minute walk daily. We also discussed the importance of eating habits and portion control.  Plan: - Encourage the patient to continue with healthy eating habits and regular exercise. - Consider referral to a weight loss specialist or bariatric surgeon for evaluation and possible revision of weight loss surgery.

## 2023-02-13 NOTE — Assessment & Plan Note (Signed)
Chronic dry eye well controlled with azelastine eye drops. Refills needed.

## 2023-02-13 NOTE — Assessment & Plan Note (Signed)
Previous vitamin D supplementation with increase in mood and energy levels reported. She would like to restart on these today. Will send medication to the pharmacy .

## 2023-02-27 ENCOUNTER — Encounter: Payer: Self-pay | Admitting: Nurse Practitioner

## 2023-02-27 ENCOUNTER — Ambulatory Visit (INDEPENDENT_AMBULATORY_CARE_PROVIDER_SITE_OTHER): Payer: 59 | Admitting: Nurse Practitioner

## 2023-02-27 ENCOUNTER — Other Ambulatory Visit (HOSPITAL_COMMUNITY): Payer: Self-pay

## 2023-02-27 VITALS — BP 128/82 | HR 78 | Wt 274.0 lb

## 2023-02-27 DIAGNOSIS — H66003 Acute suppurative otitis media without spontaneous rupture of ear drum, bilateral: Secondary | ICD-10-CM | POA: Diagnosis not present

## 2023-02-27 DIAGNOSIS — Z6841 Body Mass Index (BMI) 40.0 and over, adult: Secondary | ICD-10-CM | POA: Diagnosis not present

## 2023-02-27 HISTORY — DX: Acute suppurative otitis media without spontaneous rupture of ear drum, bilateral: H66.003

## 2023-02-27 MED ORDER — AZITHROMYCIN 250 MG PO TABS
ORAL_TABLET | ORAL | 0 refills | Status: AC
  Filled 2023-02-27: qty 6, 5d supply, fill #0

## 2023-02-27 MED ORDER — IBUPROFEN 600 MG PO TABS
600.0000 mg | ORAL_TABLET | Freq: Three times a day (TID) | ORAL | 3 refills | Status: DC | PRN
  Filled 2023-02-27: qty 30, 10d supply, fill #0

## 2023-02-27 MED ORDER — FLUCONAZOLE 150 MG PO TABS
150.0000 mg | ORAL_TABLET | Freq: Once | ORAL | 2 refills | Status: AC
  Filled 2023-02-27: qty 2, 3d supply, fill #0

## 2023-02-27 NOTE — Progress Notes (Signed)
Tollie Eth, DNP, AGNP-c [facility] [address] [place], [place] [address] [phone]  Subjective:   Stacey Palmer is a 42 y.o. female presents to day for evaluation of: Bilateral ear pain and cough.  Roe is having a persistent cough over the weekend, which is now progressed to include throbbing ear pain bilaterally.  She describes experiencing hot and cold sensations been shivering in the last few days but no true temperature.  She also mentions having a sore throat.  She does have a history of allergy symptoms which have progressively worsened over the past several weeks.  She also mentions that she is concerned about her weight.  She has recently increased her walking to help manage this.  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  This is limited her ability to exercise as she would like.  She expresses frustration that she can no longer qualify for Wegovy due to insurance but does still express a strong desire for weight management.  PMH, Medications, and Allergies reviewed and updated in chart as appropriate.   ROS negative except for what is listed in HPI. Objective:  BP 128/82   Pulse 78   Wt 274 lb (124.3 kg)   LMP 02/04/2023   BMI 42.91 kg/m  Physical Exam Vitals and nursing note reviewed.  Constitutional:      Appearance: She is obese. She is ill-appearing.  HENT:     Head: Normocephalic.     Right Ear: Tenderness present. A middle ear effusion is present.     Left Ear: Tenderness present. A middle ear effusion is present.     Nose: Congestion and rhinorrhea present.     Mouth/Throat:     Pharynx: Posterior oropharyngeal erythema present.  Eyes:     Pupils: Pupils are equal, round, and reactive to light.  Cardiovascular:     Rate and Rhythm: Normal rate and regular rhythm.     Pulses: Normal pulses.     Heart sounds: Normal heart sounds.  Pulmonary:     Effort: Pulmonary effort is normal.     Breath sounds:  Normal breath sounds.  Skin:    General: Skin is warm.  Neurological:     Mental Status: She is alert.  Psychiatric:        Mood and Affect: Mood normal.           Assessment & Plan:   Problem List Items Addressed This Visit     Morbid obesity with BMI of 40.0-44.9, adult (HCC)    Difficulty with weight management with interest in weight loss assistance.  Unfortunately she has been unable to achieve significant weight changes with diet and exercise alone. Plan: -Referral sent to healthy weight and wellness for evaluation and recommendations -Encourage daily walking of at least 10 minutes with increased distance and time each week. -Dietary recommendations provided.      Relevant Orders   Amb Ref to Medical Weight Management   Non-recurrent acute suppurative otitis media of both ears without spontaneous rupture of tympanic membranes - Primary    Bilateral ear effusions are present with ear pain, throbbing, and swelling.  This coupled with erythematous, sore throat and cough we will be able to concerns for infectious process. Plan: -Azithromycin sent to pharmacy for infection treatment -Recommend ibuprofen for pain relief and reduction of inflammation.      Relevant Medications   ibuprofen (ADVIL) 600 MG tablet    Time: 31 minutes, >50% spent counseling,  care coordination, chart review, and documentation.    Tollie Eth, DNP, AGNP-c 03/16/2023  11:14 PM    History, Medications, Surgery, SDOH, and Family History reviewed and updated as appropriate.

## 2023-03-03 ENCOUNTER — Ambulatory Visit (INDEPENDENT_AMBULATORY_CARE_PROVIDER_SITE_OTHER): Payer: 59 | Admitting: Nurse Practitioner

## 2023-03-03 ENCOUNTER — Other Ambulatory Visit (HOSPITAL_COMMUNITY): Payer: Self-pay

## 2023-03-03 ENCOUNTER — Encounter: Payer: Self-pay | Admitting: Nurse Practitioner

## 2023-03-03 VITALS — BP 128/80 | HR 65 | Temp 98.3°F | Wt 276.2 lb

## 2023-03-03 DIAGNOSIS — J029 Acute pharyngitis, unspecified: Secondary | ICD-10-CM | POA: Diagnosis not present

## 2023-03-03 DIAGNOSIS — R52 Pain, unspecified: Secondary | ICD-10-CM | POA: Diagnosis not present

## 2023-03-03 DIAGNOSIS — R059 Cough, unspecified: Secondary | ICD-10-CM

## 2023-03-03 LAB — POCT RAPID STREP A (OFFICE): Rapid Strep A Screen: NEGATIVE

## 2023-03-03 LAB — POCT INFLUENZA A/B
Influenza A, POC: NEGATIVE
Influenza B, POC: NEGATIVE

## 2023-03-03 LAB — POC COVID19 BINAXNOW: SARS Coronavirus 2 Ag: NEGATIVE

## 2023-03-03 MED ORDER — DM-GUAIFENESIN ER 30-600 MG PO TB12
1.0000 | ORAL_TABLET | Freq: Two times a day (BID) | ORAL | 0 refills | Status: DC
Start: 2023-03-03 — End: 2023-05-27
  Filled 2023-03-03: qty 20, 10d supply, fill #0

## 2023-03-03 MED ORDER — AMOXICILLIN 875 MG PO TABS
875.0000 mg | ORAL_TABLET | Freq: Two times a day (BID) | ORAL | 0 refills | Status: DC
Start: 2023-03-03 — End: 2023-03-27
  Filled 2023-03-03: qty 14, 7d supply, fill #0

## 2023-03-03 NOTE — Patient Instructions (Signed)
I have sent in a different antibiotic for you. Finish the antibiotic you are taking and start the new one.  These will treat two different infections so keep taking them both. I also sent in Mucinex to help with the cough and congestion. Be sure you are drinking plenty of water.   For the diarrhea you can take pepto bismol or imodium to help slow down the bowel movements.

## 2023-03-03 NOTE — Progress Notes (Unsigned)
Tollie Eth, DNP, AGNP-c [facility] [address] [place], [place] [address] [phone]  Subjective:   Stacey Palmer is a 42 y.o. female presents to day for evaluation of: Stacey Palmer presents today with persistent symptoms of cough, sore throat, and ear pain despite having been started on antibiotics 4 days ago. She reports still having a couple of days left on her antibiotic course. She is experiencing diarrhea, body aches, and increased thirst in addition to her ear pain and sore throat. She also mentions feeling fatigued even with vitamin D supplementation. She has been having difficulty sleeping due to coughing throughout the night and experiencing hot and cold sensations. Stacey Palmer reports productive cough and sinus pressure. She has a history of ear infection, which has been causing itchiness in her ears.  PMH, Medications, and Allergies reviewed and updated in chart as appropriate.   ROS negative except for what is listed in HPI. Objective:  BP 128/80   Pulse 65   Temp 98.3 F (36.8 C)   Wt 276 lb 3.2 oz (125.3 kg)   LMP 02/04/2023   SpO2 99%   BMI 43.26 kg/m  Physical Exam Vitals and nursing note reviewed.  Constitutional:      Appearance: Normal appearance. She is ill-appearing.  HENT:     Head: Normocephalic.     Right Ear: Hearing normal. Tenderness present. A middle ear effusion is present. Tympanic membrane is bulging.     Left Ear: Hearing normal. Tenderness present. A middle ear effusion is present. Tympanic membrane is bulging.     Nose: Congestion present.     Mouth/Throat:     Mouth: Mucous membranes are moist.     Pharynx: Posterior oropharyngeal erythema present.  Eyes:     Extraocular Movements: Extraocular movements intact.     Pupils: Pupils are equal, round, and reactive to light.  Neck:     Vascular: No carotid bruit.  Cardiovascular:     Rate and Rhythm: Normal rate and regular rhythm.     Pulses: Normal pulses.     Heart sounds:  Normal heart sounds.  Pulmonary:     Effort: Pulmonary effort is normal.     Breath sounds: Normal breath sounds.  Musculoskeletal:        General: Normal range of motion.     Cervical back: Normal range of motion.  Lymphadenopathy:     Cervical: Cervical adenopathy present.  Skin:    General: Skin is warm and dry.     Capillary Refill: Capillary refill takes less than 2 seconds.  Neurological:     General: No focal deficit present.     Mental Status: She is alert.  Psychiatric:        Mood and Affect: Mood normal.         Assessment & Plan:   Problem List Items Addressed This Visit     Cough - Primary    Symptoms and presentation consistent with an upper respiratory infection. The patient presents with persistent cough, sinus pressure, and fatigue despite recent antibiotic treatment. Plan: - Discontinue azithromycin and prescribe a different antibiotic to target possible secondary infection. - Monitor the patient's symptoms and schedule a follow-up if there is no improvement. - Take imodium for diarrhea and be sure to stay well hydrated.        Relevant Medications   dextromethorphan-guaiFENesin (MUCINEX DM) 30-600 MG 12hr tablet   Other Visit Diagnoses     Sore throat       Relevant  Medications   amoxicillin (AMOXIL) 875 MG tablet   Other Relevant Orders   POC COVID-19 (Completed)   Influenza A/B (Completed)   Rapid Strep A (Completed)   Body aches             Tollie Eth, DNP, AGNP-c 03/12/2023  7:21 PM    History, Medications, Surgery, SDOH, and Family History reviewed and updated as appropriate.

## 2023-03-12 NOTE — Assessment & Plan Note (Addendum)
Symptoms and presentation consistent with an upper respiratory infection. The patient presents with persistent cough, sinus pressure, and fatigue despite recent antibiotic treatment. Plan: - Discontinue azithromycin and prescribe a different antibiotic to target possible secondary infection. - Monitor the patient's symptoms and schedule a follow-up if there is no improvement. - Take imodium for diarrhea and be sure to stay well hydrated.

## 2023-03-16 NOTE — Assessment & Plan Note (Signed)
Difficulty with weight management with interest in weight loss assistance.  Unfortunately she has been unable to achieve significant weight changes with diet and exercise alone. Plan: -Referral sent to healthy weight and wellness for evaluation and recommendations -Encourage daily walking of at least 10 minutes with increased distance and time each week. -Dietary recommendations provided.

## 2023-03-16 NOTE — Assessment & Plan Note (Signed)
Bilateral ear effusions are present with ear pain, throbbing, and swelling.  This coupled with erythematous, sore throat and cough we will be able to concerns for infectious process. Plan: -Azithromycin sent to pharmacy for infection treatment -Recommend ibuprofen for pain relief and reduction of inflammation.

## 2023-03-19 ENCOUNTER — Ambulatory Visit: Payer: 59 | Admitting: Nurse Practitioner

## 2023-03-26 ENCOUNTER — Ambulatory Visit: Payer: 59 | Admitting: Nurse Practitioner

## 2023-03-27 ENCOUNTER — Ambulatory Visit
Admission: RE | Admit: 2023-03-27 | Discharge: 2023-03-27 | Disposition: A | Discharge status: Home / Self Care | Payer: 59 | Source: Ambulatory Visit | Attending: Nurse Practitioner | Admitting: Nurse Practitioner

## 2023-03-27 ENCOUNTER — Encounter: Payer: Self-pay | Admitting: Nurse Practitioner

## 2023-03-27 ENCOUNTER — Ambulatory Visit (INDEPENDENT_AMBULATORY_CARE_PROVIDER_SITE_OTHER): Payer: 59 | Admitting: Nurse Practitioner

## 2023-03-27 VITALS — BP 132/86 | HR 66 | Temp 98.3°F | Resp 18 | Wt 277.4 lb

## 2023-03-27 DIAGNOSIS — W19XXXA Unspecified fall, initial encounter: Secondary | ICD-10-CM

## 2023-03-27 DIAGNOSIS — M79662 Pain in left lower leg: Secondary | ICD-10-CM

## 2023-03-27 DIAGNOSIS — R600 Localized edema: Secondary | ICD-10-CM | POA: Diagnosis not present

## 2023-03-27 DIAGNOSIS — M7989 Other specified soft tissue disorders: Secondary | ICD-10-CM

## 2023-03-27 NOTE — Progress Notes (Unsigned)
Tollie Eth, DNP, AGNP-c [facility] [address] [place], [place] [address] [phone]  Subjective:   Stacey Palmer is a 42 y.o. female presents to day for evaluation of: Fall with injury  Stacey Palmer presents today with concerns after a fall 10 days ago. She tells me while walking through a parking lot she tripped and fell onto concrete, resulting in an injury to her left lower leg and right knee. Since that time she has had consistent bruising, swelling, and pain in the left lower leg and right knee with difficulty standing and walking. 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   She has not had any imaging or evaluation since the fall. She reports her right knee is painful with any movement and to touch. Her left calf is swollen and has a large knot and bruise on it which is also very painful. She has not lost any sensation and has not noticed any particular warmth from the area of the bruising or near it. She has no CP or ShOB.   PMH, Medications, and Allergies reviewed and updated in chart as appropriate.   ROS negative except for what is listed in HPI. Objective:  BP 132/86   Pulse 66   Temp 98.3 F (36.8 C) (Oral)   Resp 18   Wt 277 lb 6.4 oz (125.8 kg)   LMP 03/20/2023   SpO2 99% Comment: room air  BMI 43.45 kg/m  Physical Exam Vitals and nursing note reviewed.  Eyes:     Conjunctiva/sclera: Conjunctivae normal.  Cardiovascular:     Rate and Rhythm: Normal rate and regular rhythm.     Pulses: Normal pulses.     Heart sounds: Normal heart sounds.  Pulmonary:     Effort: Pulmonary effort is normal.     Breath sounds: Normal breath sounds.  Musculoskeletal:        General: Swelling, tenderness and signs of injury present.     Right lower leg: Edema present.     Left lower leg: Edema  present.  Skin:    General: Skin is warm and dry.     Capillary Refill: Capillary refill takes less than 2 seconds.     Findings: Bruising present.  Neurological:     General: No focal deficit present.     Mental Status: She is alert.     Sensory: No sensory deficit.     Motor: Weakness present.     Gait: Gait abnormal.  Psychiatric:        Mood and Affect: Mood normal.           Assessment & Plan:   Problem List Items Addressed This Visit     Fall - Primary    Alaiya fell about 10 days ago onto concrete resulting in significant bruising to the left calf and injury to the right knee. The bruising of the left leg is quite extensive and I suspect a large hematoma is present due to the palpable nodule present. Given the size and pain the patient is experiencing, I would like to rule out possible DVT to be safe. I will order an ultrasound for evaluation of this. I will also order an x-ray of the fib/tib and ankle given the pain and appearance.  The right knee is swollen, however, there  is minimal bruising present. I suspect this is soft tissue injury, but unable to rule out tendon, ligament, or meniscal damage at this time. The pain limits my evaluation with typical maneuvers. If not improvement with rest and ice and gentle exercise, will refer to ortho.       Relevant Orders   VAS [place] LOWER EXTREMITY VENOUS (DVT) (Completed)   DG Tibia/Fibula Left   DG Ankle Complete Left   Other Visit Diagnoses     Pain and swelling of left lower leg       Relevant Orders   VAS [place] LOWER EXTREMITY VENOUS (DVT) (Completed)   DG Tibia/Fibula Left   DG Ankle Complete Left         Tollie Eth, DNP, AGNP-c 04/02/2023  7:49 AM    Time: 34 minutes, >50% spent counseling, care coordination, chart review, and documentation.   History, Medications, Surgery, SDOH, and Family History reviewed and updated as appropriate.

## 2023-03-27 NOTE — Patient Instructions (Signed)
Start ankle exercises next week unless I call you and tell you not to due to the results of the x-ray.   I have sent the x-ray to [facility] at [facility], you can go straight there and walk in to have this done  I have sent the vascular ultrasound order and they will call you to schedule.

## 2023-03-31 ENCOUNTER — Telehealth: Payer: Self-pay | Admitting: Nurse Practitioner

## 2023-03-31 NOTE — Telephone Encounter (Signed)
Stacey Palmer called and seen you Friday for knee pain. She says the pain hasn't gotten any better and is very swollen. She did go and have an xray done and mamo, she says she has heard back from xray but not mamo. She is wanting to know if there are any recommendations and is wanting to know when do you think she can return to work because she does a lot of standing and on her feet and doesn't feel like she can right now but is also asking for another work note.

## 2023-04-01 ENCOUNTER — Ambulatory Visit (HOSPITAL_COMMUNITY)
Admission: RE | Admit: 2023-04-01 | Discharge: 2023-04-01 | Disposition: A | Discharge status: Home / Self Care | Payer: 59 | Source: Ambulatory Visit | Attending: Vascular Surgery | Admitting: Vascular Surgery

## 2023-04-01 ENCOUNTER — Other Ambulatory Visit: Payer: Self-pay

## 2023-04-01 DIAGNOSIS — W19XXXA Unspecified fall, initial encounter: Secondary | ICD-10-CM | POA: Insufficient documentation

## 2023-04-01 DIAGNOSIS — M7989 Other specified soft tissue disorders: Secondary | ICD-10-CM

## 2023-04-01 DIAGNOSIS — M79662 Pain in left lower leg: Secondary | ICD-10-CM

## 2023-04-01 DIAGNOSIS — Y939 Activity, unspecified: Secondary | ICD-10-CM | POA: Insufficient documentation

## 2023-04-01 DIAGNOSIS — G629 Polyneuropathy, unspecified: Secondary | ICD-10-CM | POA: Diagnosis not present

## 2023-04-01 DIAGNOSIS — R609 Edema, unspecified: Secondary | ICD-10-CM | POA: Insufficient documentation

## 2023-04-01 DIAGNOSIS — Y929 Unspecified place or not applicable: Secondary | ICD-10-CM | POA: Diagnosis not present

## 2023-04-01 NOTE — Telephone Encounter (Signed)
Pt called again concerning going back to work. Please advise pt at [phone].

## 2023-04-02 ENCOUNTER — Encounter (INDEPENDENT_AMBULATORY_CARE_PROVIDER_SITE_OTHER): Payer: 59 | Admitting: Internal Medicine

## 2023-04-02 ENCOUNTER — Encounter: Payer: Self-pay | Admitting: Nurse Practitioner

## 2023-04-02 DIAGNOSIS — W19XXXA Unspecified fall, initial encounter: Secondary | ICD-10-CM | POA: Insufficient documentation

## 2023-04-02 HISTORY — DX: Unspecified fall, initial encounter: W19.XXXA

## 2023-04-02 NOTE — Assessment & Plan Note (Signed)
Stacey Palmer fell about 10 days ago onto concrete resulting in significant bruising to the left calf and injury to the right knee. The bruising of the left leg is quite extensive and I suspect a large hematoma is present due to the palpable nodule present. Given the size and pain the patient is experiencing, I would like to rule out possible DVT to be safe. I will order an ultrasound for evaluation of this. I will also order an x-ray of the fib/tib and ankle given the pain and appearance.  The right knee is swollen, however, there is minimal bruising present. I suspect this is soft tissue injury, but unable to rule out tendon, ligament, or meniscal damage at this time. The pain limits my evaluation with typical maneuvers. If not improvement with rest and ice and gentle exercise, will refer to ortho.

## 2023-04-03 ENCOUNTER — Ambulatory Visit
Admission: RE | Admit: 2023-04-03 | Discharge: 2023-04-03 | Disposition: A | Discharge status: Home / Self Care | Payer: 59 | Source: Ambulatory Visit | Attending: Urgent Care | Admitting: Urgent Care

## 2023-04-03 ENCOUNTER — Ambulatory Visit (INDEPENDENT_AMBULATORY_CARE_PROVIDER_SITE_OTHER): Payer: 59 | Admitting: Physician Assistant

## 2023-04-03 ENCOUNTER — Other Ambulatory Visit (HOSPITAL_COMMUNITY): Payer: Self-pay

## 2023-04-03 ENCOUNTER — Encounter: Payer: Self-pay | Admitting: Physician Assistant

## 2023-04-03 VITALS — BP 127/82 | HR 90 | Temp 97.8°F | Resp 20

## 2023-04-03 DIAGNOSIS — S8012XA Contusion of left lower leg, initial encounter: Secondary | ICD-10-CM

## 2023-04-03 DIAGNOSIS — J988 Other specified respiratory disorders: Secondary | ICD-10-CM | POA: Diagnosis not present

## 2023-04-03 DIAGNOSIS — B9789 Other viral agents as the cause of diseases classified elsewhere: Secondary | ICD-10-CM

## 2023-04-03 DIAGNOSIS — H65191 Other acute nonsuppurative otitis media, right ear: Secondary | ICD-10-CM

## 2023-04-03 DIAGNOSIS — J309 Allergic rhinitis, unspecified: Secondary | ICD-10-CM

## 2023-04-03 HISTORY — DX: Contusion of left lower leg, initial encounter: S80.12XA

## 2023-04-03 MED ORDER — AMOXICILLIN 875 MG PO TABS
875.0000 mg | ORAL_TABLET | Freq: Two times a day (BID) | ORAL | 0 refills | Status: DC
  Filled 2023-04-03: qty 14, 7d supply, fill #0

## 2023-04-03 MED ORDER — MELOXICAM 15 MG PO TABS
15.0000 mg | ORAL_TABLET | Freq: Every day | ORAL | 0 refills | Status: DC
  Filled 2023-04-03: qty 30, 30d supply, fill #0

## 2023-04-03 MED ORDER — PROMETHAZINE-DM 6.25-15 MG/5ML PO SYRP
5.0000 mL | ORAL_SOLUTION | Freq: Three times a day (TID) | ORAL | 0 refills | Status: DC | PRN
  Filled 2023-04-03: qty 140, 10d supply, fill #0
  Filled 2023-04-03: qty 60, 4d supply, fill #0

## 2023-04-03 MED ORDER — PSEUDOEPHEDRINE HCL 60 MG PO TABS
60.0000 mg | ORAL_TABLET | Freq: Three times a day (TID) | ORAL | 0 refills | Status: DC | PRN
  Filled 2023-04-03: qty 30, 10d supply, fill #0

## 2023-04-03 NOTE — ED Triage Notes (Signed)
Pt reports she has right ear pain x 2 days. Pt states her ear is throbbing and itchy. Took tylenol but no relief.

## 2023-04-03 NOTE — Progress Notes (Signed)
Office Visit Note   Patient: Stacey Palmer           Date of Birth: 02/21/1981           MRN: [id number] Visit Date: 04/03/2023              Requested by: Tollie Eth, NP [address] [place],  [place] [address] PCP: Tollie Eth, NP   Assessment & Plan: Visit Diagnoses:  1. Contusion of left lower extremity, initial encounter     Plan: Pleasant 42 year old woman with a 2-week history of pain and swelling on her left lower leg.  She says she sustained this after she fell in a parking lot.  She was thoroughly evaluated by urgent care.  X-rays were negative for any fracture or tib-fib or left ankle.  She also because of her leg pain and swelling had an ultrasound which was negative for DVT.  She is in follow-up today she is feeling a little bit better but still has quite a bit of swelling over the anterior aspect of her proximal tibia.  Also has some lateral ankle pain.  Findings consistent with a contusion and hematoma no signs of DVT compartments are soft and compressible she has good dorsiflexion plantarflexion eversion inversion.  She is tender over the lateral ligaments.  No cellulitis.  Knee exam demonstrates some tenderness but no instability issues.  Have advised her to continue with conservative care.  Will call her in an oral anti-inflammatory instead of ibuprofen and she knows not to take both.  Also advised she would like to have an ankle brace with this was furnished to her today.  Will follow-up in 2 or 3 weeks sooner if she has increasing difficulties  Follow-Up Instructions: No follow-ups on file.   Orders:  No orders of the defined types were placed in this encounter.  Meds ordered this encounter  Medications   meloxicam (MOBIC) 15 MG tablet    Sig: Take 1 tablet (15 mg total) by mouth daily.    Dispense:  30 tablet    Refill:  0      Procedures: No procedures performed   Clinical Data: No additional findings.   Subjective: Chief Complaint  Patient  presents with   Left Leg - Pain    HPI pleasant 42 year old woman who is 2 weeks status post fall onto her left leg in a parking lot.  Had significant pain in her left anterior lower leg and ankle.  Was seen at the emergency room/urgent care.  X-rays were negative and an ultrasound of negative for DVT.  She has been treating this with using a cane and symptomatically.  Is here for follow-up   Review of Systems  All other systems reviewed and are negative.    Objective: Vital Signs: LMP 03/20/2023   Physical Exam Constitutional:      Appearance: Normal appearance.  Pulmonary:     Effort: Pulmonary effort is normal.  Skin:    General: Skin is warm and dry.  Neurological:     General: No focal deficit present.     Mental Status: She is alert.     Ortho Exam Left leg she has some ecchymosis on the anterior proximal tibia.  Is soft and compressible no surrounding cellulitis.  She is neurovascular intact.  Has good dorsiflexion plantarflexion of her ankle inversion and eversion.  Good strength with resisted straight leg raise.  Patella tendon palpable and intact she does have some tenderness over the  joint line of the knee.  Is tender also over the lateral ligaments of the ankle. Specialty Comments:  No specialty comments available.  Imaging: No results found.   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/09/2013   Morbid obesity with BMI of 40.0-44.9, adult (HCC) 02/10/2008   Past Medical  History:  Diagnosis Date   Abdominal cramps 12/16/2020   Allergic rhinitis 06/05/2014   Allergy    Anxiety    Cough 02/22/2019   Depression, major, single episode, 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   pHQ 9 SCORE OF 12 IN 12/2018, score of 0 in 03/2019 on medication   Dry eyes 12/26/2018   Encounter for annual physical exam 01/28/2022   Encounter for gynecological examination with Papanicolaou smear of cervix 01/28/2022   Encounter for screening fecal occult blood testing 01/28/2022   Family planning 01/28/2022   Fatigue 12/06/2019   Feeling sad 01/28/2022   H/O gastric sleeve 2018   Hair loss 12/06/2019   History of mammogram 2021   Non-recurrent acute suppurative otitis media of both ears without spontaneous rupture of tympanic membranes 02/27/2023   Obesity    Obesity (BMI 30-39.9) 06/11/2015   S/p bariatric surgery in 2017   Plantar fasciitis    Pregnancy examination or test, negative result 01/28/2022   Skin tag 06/05/2014   Sleep apnea    URI (upper respiratory infection) 08/23/2012   Vaginal irritation 09/18/2020   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   Dx 06/2014    Family History  Problem Relation Age of Onset   Hypertension Mother    Kidney disease Father    Diabetes Father    Breast cancer Paternal Aunt    Dementia Paternal Grandmother    Asthma Daughter     Past Surgical History:  Procedure Laterality Date   BARIATRIC SURGERY  2018   CESAREAN SECTION     TUBAL LIGATION     Social History   Occupational History   Not on file  Tobacco Use   Smoking status: Never    Passive exposure: Never   Smokeless tobacco: Never  Vaping Use   Vaping Use: Never used  Substance and Sexual Activity   Alcohol use: No   Drug use: No   Sexual activity: Not Currently    Birth control/protection: Surgical    Comment: tubal

## 2023-04-03 NOTE — ED Provider Notes (Signed)
[facility]  Note:  This document was prepared using Conservation officer, historic buildings and may include unintentional dictation errors.  MRN: [id number] DOB: 10/21/1981  Subjective:   Stacey Palmer is a 42 y.o. female presenting for 2 day history of acute onset right ear pain, ear fullness, itching and irritation.  Patient has previously had a bilateral ear infection.  She has also been having a runny stuffy nose, drainage, throat pain, coughing.  Takes Zyrtec for her allergies.  No shortness of breath or wheezing.  Has asthma, no refills needed for her albuterol. No smoking of any kind including cigarettes, cigars, vaping, marijuana use.    No current facility-administered medications for this encounter.  Current Outpatient Medications:    albuterol (VENTOLIN HFA) 108 (90 Base) MCG/ACT inhaler, Inhale 2 puffs into the lungs every 6 (six) hours as needed for wheezing., Disp: 8 g, Rfl: 11   azelastine (OPTIVAR) 0.05 % ophthalmic solution, Apply 1 drop to eye 2 (two) times daily., Disp: 6 mL, Rfl: 6   dextromethorphan-guaiFENesin (MUCINEX DM) 30-600 MG 12hr tablet, Take 1 tablet by mouth 2 (two) times daily., Disp: 20 tablet, Rfl: 0   gabapentin (NEURONTIN) 300 MG capsule, Take 1 capsule (300 mg total) by mouth 2 (two) times daily., Disp: 90 capsule, Rfl: 3   ibuprofen (ADVIL) 600 MG tablet, Take 1 tablet (600 mg total) by mouth every 8 (eight) hours as needed., Disp: 30 tablet, Rfl: 3   levocetirizine (XYZAL) 5 MG tablet, Take 1 tablet (5 mg total) by mouth every evening., Disp: 90 tablet, Rfl: 3   meloxicam (MOBIC) 15 MG tablet, Take 1 tablet (15 mg total) by mouth daily., Disp: 30 tablet, Rfl: 0   Vitamin D, Ergocalciferol, (DRISDOL) 1.25 MG (50000 UNIT) CAPS capsule, Take 1 capsule (50,000 Units total) by mouth every 7 (seven) days. Take for 12 total doses(weeks) than can transition to 1000 units OTC supplement daily, Disp: 12 capsule, Rfl: 0   No Known  Allergies  Past Medical History:  Diagnosis Date   Abdominal cramps 12/16/2020   Allergic rhinitis 06/05/2014   Allergy    Anxiety    Cough 02/22/2019   Depression, major, single episode, 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   pHQ 9 SCORE OF 12 IN 12/2018, score of 0 in 03/2019 on medication   Dry eyes 12/26/2018   Encounter for annual physical exam 01/28/2022   Encounter for gynecological examination with Papanicolaou smear of cervix 01/28/2022   Encounter for screening fecal occult blood testing 01/28/2022   Family planning 01/28/2022   Fatigue 12/06/2019   Feeling sad 01/28/2022   H/O gastric sleeve 2018   Hair loss 12/06/2019   History of mammogram 2021   Non-recurrent acute suppurative otitis media of both ears without spontaneous rupture of tympanic membranes 02/27/2023   Obesity    Obesity (BMI 30-39.9) 06/11/2015   S/p bariatric surgery in 2017   Plantar fasciitis    Pregnancy examination or test, negative result 01/28/2022   Skin tag 06/05/2014   Sleep apnea    URI (upper respiratory infection) 08/23/2012   Vaginal irritation 09/18/2020   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   Dx 06/2014     Past Surgical History:  Procedure Laterality Date   BARIATRIC SURGERY  2018   CESAREAN SECTION     TUBAL LIGATION      Family History  Problem Relation Age of Onset   Hypertension Mother    Kidney disease Father  Diabetes Father    Breast cancer Paternal Aunt    Dementia Paternal Grandmother    Asthma Daughter     Social History   Tobacco Use   Smoking status: Never    Passive exposure: Never   Smokeless tobacco: Never  Vaping Use   Vaping Use: Never used  Substance Use Topics   Alcohol use: No   Drug use: No    ROS   Objective:   Vitals: BP 127/82 (BP Location: Right Arm)   Pulse 90   Temp 97.8 F (36.6 C) (Oral)   Resp 20   LMP 03/20/2023   SpO2 95%   Physical Exam Constitutional:      General: She is not in acute distress.     Appearance: Normal appearance. She is well-developed and normal weight. She is not ill-appearing, toxic-appearing or diaphoretic.  HENT:     Head: Normocephalic and atraumatic.     Right Ear: Ear canal and external ear normal. No drainage or tenderness. No middle ear effusion. There is no impacted cerumen. Tympanic membrane is erythematous and bulging.     Left Ear: Tympanic membrane, ear canal and external ear normal. No drainage or tenderness.  No middle ear effusion. There is no impacted cerumen. Tympanic membrane is not erythematous or bulging.     Nose: Nose normal. No congestion or rhinorrhea.     Mouth/Throat:     Mouth: Mucous membranes are moist. No oral lesions.     Pharynx: No pharyngeal swelling, oropharyngeal exudate, posterior oropharyngeal erythema or uvula swelling.     Tonsils: No tonsillar exudate or tonsillar abscesses.  Eyes:     General: No scleral icterus.       Right eye: No discharge.        Left eye: No discharge.     Extraocular Movements: Extraocular movements intact.     Right eye: Normal extraocular motion.     Left eye: Normal extraocular motion.     Conjunctiva/sclera: Conjunctivae normal.  Cardiovascular:     Rate and Rhythm: Normal rate and regular rhythm.     Heart sounds: Normal heart sounds. No murmur heard.    No friction rub. No gallop.  Pulmonary:     Effort: Pulmonary effort is normal. No respiratory distress.     Breath sounds: No stridor. No wheezing, rhonchi or rales.  Chest:     Chest wall: No tenderness.  Musculoskeletal:     Cervical back: Normal range of motion and neck supple.  Lymphadenopathy:     Cervical: No cervical adenopathy.  Skin:    General: Skin is warm and dry.  Neurological:     General: No focal deficit present.     Mental Status: She is alert and oriented to person, place, and time.  Psychiatric:        Mood and Affect: Mood normal.        Behavior: Behavior normal.     Assessment and Plan :   PDMP not reviewed  this encounter.  1. Other non-recurrent acute nonsuppurative otitis media of right ear   2. Viral respiratory infection   3. Allergic rhinitis, unspecified seasonality, unspecified trigger    Deferred imaging given clear cardiopulmonary exam, hemodynamically stable vital signs. Start amoxicillin to cover for otitis media likely secondary to a viral respiratory illness. Use supportive care otherwise. Counseled patient on potential for adverse effects with medications prescribed/recommended today, ER and return-to-clinic precautions discussed, patient verbalized understanding.    Wallis Bamberg, [place] 04/03/23 [phone]

## 2023-04-08 ENCOUNTER — Encounter (INDEPENDENT_AMBULATORY_CARE_PROVIDER_SITE_OTHER): Payer: 59 | Admitting: Family Medicine

## 2023-04-20 ENCOUNTER — Ambulatory Visit: Payer: 59 | Admitting: Physician Assistant

## 2023-04-24 ENCOUNTER — Ambulatory Visit: Payer: 59 | Admitting: Physician Assistant

## 2023-05-01 ENCOUNTER — Ambulatory Visit (INDEPENDENT_AMBULATORY_CARE_PROVIDER_SITE_OTHER): Payer: 59 | Admitting: Physician Assistant

## 2023-05-01 ENCOUNTER — Encounter: Payer: Self-pay | Admitting: Physician Assistant

## 2023-05-01 DIAGNOSIS — S8012XA Contusion of left lower leg, initial encounter: Secondary | ICD-10-CM

## 2023-05-01 NOTE — Progress Notes (Signed)
Office Visit Note   Patient: Stacey Palmer           Date of Birth: 03/13/1981           MRN: [id number] Visit Date: 05/01/2023              Requested by: Tollie Eth, NP [address] [place],  [place] [address] PCP: Tollie Eth, NP  Chief Complaint  Patient presents with   Left Leg - Pain, Follow-up      HPI: Patient is a pleasant 42 year old woman who presents for follow-up on her left Palmer.  She is now 6 weeks status post post falling directly onto her left Palmer.  She has had x-rays and ultrasounds that were all negative.  She is not sure if she is much better today.  She admits that she has not yet started the meloxicam.  Denies any instability  Assessment & Plan: Visit Diagnoses: Left Palmer contusion  Plan: We had a discussion.  Certainly I be happy to order an MRI to rule out any pathology.  None of this seems to be in her Palmer but at the proximal tibia.  She mostly pain is noticed with direct compression pressure.  She would like to give this a bit more time and I think that is appropriate.  Would like to try using the meloxicam instead of ibuprofen  Follow-Up Instructions: Return in about 2 years (around 04/30/2025).   Ortho Exam  Patient is alert, oriented, no adenopathy, well-dressed, normal affect, normal respiratory effort. Left Palmer no effusion no erythema compartments are soft and compressible negative Denna Haggard' sign she is neurovascular intact no pain over the medial lateral joint line of the patellofemoral joint she does have some pain over the proximal medial tibia.  No associated ecchymosis.  Stability is intact  Imaging: No results found. No images are attached to the encounter.  Labs: Lab Results  Component Value Date   HGBA1C 5.4 12/09/2022   HGBA1C 5.1 04/03/2022   HGBA1C 4.9 06/04/2017   REPTSTATUS 09/19/2022 FINAL 09/16/2022   CULT >=100,000 COLONIES/mL ESCHERICHIA COLI (A) 09/16/2022   LABORGA ESCHERICHIA COLI (A) 09/16/2022      Lab Results  Component Value Date   ALBUMIN 4.2 12/09/2022   ALBUMIN 4.1 04/03/2022   ALBUMIN 3.5 04/07/2020    No results found for: "MG" Lab Results  Component Value Date   VD25OH 22.8 (L) 12/09/2022   VD25OH 26.8 (L) 04/03/2022   VD25OH 28 (L) 08/19/2018    No results found for: "PREALBUMIN"    Latest Ref Rng & Units 12/09/2022    9:14 AM 04/03/2022   10:56 AM 06/19/2021    8:27 AM  CBC EXTENDED  WBC 3.4 - 10.8 x10E3/uL 4.5  4.3  3.6   RBC 3.77 - 5.28 x10E6/uL 4.08  4.28  3.87   Hemoglobin 11.1 - 15.9 g/dL 19.1  47.8  29.5   HCT 34.0 - 46.6 % 37.4  39.3  36.6   Platelets 150 - 450 x10E3/uL 314  321  273   NEUT# 1.4 - 7.0 x10E3/uL 2.0  1.9  1,843   Lymph# 0.7 - 3.1 x10E3/uL 2.0  1.9  1,339      There is no height or weight on file to calculate BMI.  Orders:  No orders of the defined types were placed in this encounter.  No orders of the defined types were placed in this encounter.    Procedures: No procedures performed  Clinical  Data: No additional findings.  ROS:  All other systems negative, except as noted in the HPI. Review of Systems  All other systems reviewed and are negative.   Objective: Vital Signs: There were no vitals taken for this visit.  Specialty Comments:  No specialty comments available.  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/09/2013   Morbid obesity with BMI of 40.0-44.9, adult (HCC) 02/10/2008   Past Medical History:  Diagnosis  Date   Abdominal cramps 12/16/2020   Allergic rhinitis 06/05/2014   Allergy    Anxiety    Cough 02/22/2019   Depression, major, single episode, 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   pHQ 9 SCORE OF 12 IN 12/2018, score of 0 in 03/2019 on medication   Dry eyes 12/26/2018   Encounter for annual physical exam 01/28/2022   Encounter for gynecological examination with Papanicolaou smear of cervix 01/28/2022   Encounter for screening fecal occult blood testing 01/28/2022   Family planning 01/28/2022   Fatigue 12/06/2019   Feeling sad 01/28/2022   H/O gastric sleeve 2018   Hair loss 12/06/2019   History of mammogram 2021   Non-recurrent acute suppurative otitis media of both ears without spontaneous rupture of tympanic membranes 02/27/2023   Obesity    Obesity (BMI 30-39.9) 06/11/2015   S/p bariatric surgery in 2017   Plantar fasciitis    Pregnancy examination or test, negative result 01/28/2022   Skin tag 06/05/2014   Sleep apnea    URI (upper respiratory infection) 08/23/2012   Vaginal irritation 09/18/2020   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   Dx 06/2014    Family History  Problem Relation Age of Onset   Hypertension Mother    Kidney disease Father    Diabetes Father    Breast cancer Paternal Aunt    Dementia Paternal Grandmother    Asthma Daughter     Past Surgical History:  Procedure Laterality Date   BARIATRIC SURGERY  2018   CESAREAN SECTION     TUBAL LIGATION     Social History   Occupational History   Not on file  Tobacco Use   Smoking status: Never    Passive exposure: Never   Smokeless tobacco: Never  Vaping Use   Vaping Use: Never used  Substance and Sexual Activity   Alcohol use: No   Drug use: No   Sexual activity: Not Currently    Birth control/protection: Surgical    Comment: tubal

## 2023-05-08 ENCOUNTER — Ambulatory Visit: Payer: 59 | Admitting: Nurse Practitioner

## 2023-05-13 ENCOUNTER — Other Ambulatory Visit: Payer: Self-pay | Admitting: Nurse Practitioner

## 2023-05-13 DIAGNOSIS — E559 Vitamin D deficiency, unspecified: Secondary | ICD-10-CM

## 2023-05-14 ENCOUNTER — Ambulatory Visit: Payer: 59 | Admitting: Physician Assistant

## 2023-05-25 ENCOUNTER — Other Ambulatory Visit: Payer: Self-pay | Admitting: Nurse Practitioner

## 2023-05-25 DIAGNOSIS — Z1231 Encounter for screening mammogram for malignant neoplasm of breast: Secondary | ICD-10-CM

## 2023-05-27 ENCOUNTER — Telehealth: Payer: Medicaid Other | Admitting: Medical

## 2023-05-27 ENCOUNTER — Encounter: Payer: Self-pay | Admitting: Medical

## 2023-05-27 VITALS — Wt 240.0 lb

## 2023-05-27 DIAGNOSIS — R110 Nausea: Secondary | ICD-10-CM

## 2023-05-27 DIAGNOSIS — R350 Frequency of micturition: Secondary | ICD-10-CM

## 2023-05-27 DIAGNOSIS — B349 Viral infection, unspecified: Secondary | ICD-10-CM

## 2023-05-27 DIAGNOSIS — R509 Fever, unspecified: Secondary | ICD-10-CM | POA: Diagnosis not present

## 2023-05-27 NOTE — Progress Notes (Signed)
Subjective:     Patient ID: Stacey Palmer, female   DOB: 1981-08-08, 42 y.o.   MRN: [id number]  This visit type was conducted due to national recommendations for restrictions regarding the COVID-19 Pandemic (e.g. social distancing) in an effort to limit this patient's exposure and mitigate transmission in our community.  Due to their co-morbid illnesses, this patient is at least at moderate risk for complications without adequate follow up.  This format is felt to be most appropriate for this patient at this time.    Documentation for virtual audio and video telecommunications through [facility] encounter:  The patient was located at home. The provider was located in the office. The patient did consent to this visit and is aware of possible charges through their insurance for this visit.  The other persons participating in this telemedicine service were none. Time spent on call was 20 minutes and in review of previous records 20 minutes total.  This virtual service is not related to other E/M service within previous 7 days.   HPI Chief Complaint  Patient presents with   sick    Cough, body aches, fatigue, chills, fever- not sure what it was,    Virtual for illness x 3 days.   Started with body aches, cough, sneezing, ears hurt, chills.   May have had fever.  Feels weak, dry mouth.  Has had some nausea.  No vomiting, no diarrhea.  Some yellowish mucous with cough.  No sore throat.  Has headaches.  Hasn't done covid test.  No sick contacts.    No tick bites.  No rash.    Using some allergy medication.    No abdominal pain, no back pain.  No other aggravating or relieving factors. No other complaint.  Past Medical History:  Diagnosis Date   Abdominal cramps 12/16/2020   Allergic rhinitis 06/05/2014   Allergy    Anxiety    Cough 02/22/2019   Depression, major, single episode, 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   pHQ 9 SCORE OF 12 IN 12/2018, score of 0 in 03/2019 on medication    Dry eyes 12/26/2018   Encounter for annual physical exam 01/28/2022   Encounter for gynecological examination with Papanicolaou smear of cervix 01/28/2022   Encounter for screening fecal occult blood testing 01/28/2022   Family planning 01/28/2022   Fatigue 12/06/2019   Feeling sad 01/28/2022   H/O gastric sleeve 2018   Hair loss 12/06/2019   History of mammogram 2021   Non-recurrent acute suppurative otitis media of both ears without spontaneous rupture of tympanic membranes 02/27/2023   Obesity    Obesity (BMI 30-39.9) 06/11/2015   S/p bariatric surgery in 2017   Plantar fasciitis    Pregnancy examination or test, negative result 01/28/2022   Skin tag 06/05/2014   Sleep apnea    URI (upper respiratory infection) 08/23/2012   Vaginal irritation 09/18/2020   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 8 g 11   azelastine (OPTIVAR) 0.05 % ophthalmic solution Apply 1 drop to eye 2 (two) times daily. 6 mL 6   gabapentin (NEURONTIN) 300 MG capsule Take 1 capsule (300 mg total) by mouth 2 (two) times daily. 90 capsule 3   ibuprofen (ADVIL) 600 MG tablet Take 1 tablet (600  mg total) by mouth every 8 (eight) hours as needed. 30 tablet 3   levocetirizine (XYZAL) 5 MG tablet Take 1 tablet (5 mg total) by mouth every evening. 90 tablet 3   Vitamin D, Ergocalciferol, (DRISDOL) 1.25 MG (50000 UNIT) CAPS capsule Take 1 capsule (50,000 Units total) by mouth every 7 (seven) days. Take for 12 total doses(weeks) than can transition to 1000 units OTC supplement daily 12 capsule 0   meloxicam (MOBIC) 15 MG tablet Take 1 tablet (15 mg total) by mouth daily. (Patient not taking: Reported on 05/27/2023) 30 tablet 0   No current facility-administered medications on file prior to  visit.    Review of Systems As in subjective    Objective:   Physical Exam Due to coronavirus pandemic stay at home measures, patient visit was virtual and they were not examined in person.   Wt 240 lb (108.9 kg)   BMI 37.59 kg/m   Gen: wd, wn ,nad Somewhat ill-appearing No labored breathing or wheezing     Assessment:     Encounter Diagnoses  Name Primary?   Fever and chills Yes   Viral syndrome    Nausea    Urinary frequency        Plan:     We discussed her symptoms and concerns.  We discussed limitations of virtual consult.  Symptoms suggest viral syndrome.  Discussed rest, hydration, can use over-the-counter DayQuil/NyQuil or Mucinex DM for symptoms.  Can use Tylenol or ibuprofen for fever body aches and chills.  I encouraged her to come to our office for flu and COVID testing and possibly in person needed.  She will consider coming up.  This morning.  We discussed if much worse in the next few days such as high fever, uncontrollable vomiting, extreme weakness, shortness of breath or other then get reevaluated    F/u prn

## 2023-05-29 ENCOUNTER — Other Ambulatory Visit (INDEPENDENT_AMBULATORY_CARE_PROVIDER_SITE_OTHER): Payer: Medicaid Other

## 2023-05-29 ENCOUNTER — Ambulatory Visit: Payer: 59 | Admitting: Physician Assistant

## 2023-05-29 ENCOUNTER — Ambulatory Visit: Payer: 59 | Admitting: Nurse Practitioner

## 2023-05-29 DIAGNOSIS — R6883 Chills (without fever): Secondary | ICD-10-CM

## 2023-05-29 DIAGNOSIS — R509 Fever, unspecified: Secondary | ICD-10-CM | POA: Diagnosis not present

## 2023-05-29 DIAGNOSIS — R110 Nausea: Secondary | ICD-10-CM

## 2023-05-29 LAB — POCT INFLUENZA A/B
Influenza A, POC: NEGATIVE
Influenza B, POC: NEGATIVE

## 2023-05-29 LAB — POC COVID19 BINAXNOW: SARS Coronavirus 2 Ag: NEGATIVE

## 2023-06-05 ENCOUNTER — Ambulatory Visit: Payer: 59 | Admitting: Physician Assistant

## 2023-06-12 ENCOUNTER — Ambulatory Visit (INDEPENDENT_AMBULATORY_CARE_PROVIDER_SITE_OTHER): Payer: Medicaid Other | Admitting: Nurse Practitioner

## 2023-06-12 ENCOUNTER — Encounter: Payer: Self-pay | Admitting: Nurse Practitioner

## 2023-06-12 ENCOUNTER — Other Ambulatory Visit (HOSPITAL_COMMUNITY): Payer: Self-pay

## 2023-06-12 ENCOUNTER — Ambulatory Visit
Admission: RE | Admit: 2023-06-12 | Discharge: 2023-06-12 | Disposition: A | Discharge status: Home / Self Care | Payer: Medicaid Other | Source: Ambulatory Visit | Attending: Nurse Practitioner | Admitting: Nurse Practitioner

## 2023-06-12 VITALS — BP 124/80 | HR 62 | Wt 280.4 lb

## 2023-06-12 DIAGNOSIS — E559 Vitamin D deficiency, unspecified: Secondary | ICD-10-CM

## 2023-06-12 DIAGNOSIS — F329 Major depressive disorder, single episode, unspecified: Secondary | ICD-10-CM

## 2023-06-12 DIAGNOSIS — G629 Polyneuropathy, unspecified: Secondary | ICD-10-CM | POA: Diagnosis not present

## 2023-06-12 DIAGNOSIS — R062 Wheezing: Secondary | ICD-10-CM

## 2023-06-12 DIAGNOSIS — Z1231 Encounter for screening mammogram for malignant neoplasm of breast: Secondary | ICD-10-CM

## 2023-06-12 DIAGNOSIS — F339 Major depressive disorder, recurrent, unspecified: Secondary | ICD-10-CM

## 2023-06-12 DIAGNOSIS — H04123 Dry eye syndrome of bilateral lacrimal glands: Secondary | ICD-10-CM

## 2023-06-12 DIAGNOSIS — U071 COVID-19: Secondary | ICD-10-CM

## 2023-06-12 MED ORDER — GABAPENTIN 300 MG PO CAPS
300.0000 mg | ORAL_CAPSULE | Freq: Two times a day (BID) | ORAL | 3 refills | Status: DC
  Filled 2023-06-12: qty 60, 30d supply, fill #0
  Filled 2023-06-26: qty 90, 45d supply, fill #0

## 2023-06-12 MED ORDER — TRAZODONE HCL 50 MG PO TABS
25.0000 mg | ORAL_TABLET | Freq: Every evening | ORAL | 3 refills | Status: DC | PRN
Start: 2023-06-12 — End: 2023-08-10
  Filled 2023-06-12 – 2023-07-14 (×3): qty 30, 30d supply, fill #0

## 2023-06-12 MED ORDER — VITAMIN D (ERGOCALCIFEROL) 1.25 MG (50000 UNIT) PO CAPS
50000.0000 [IU] | ORAL_CAPSULE | ORAL | 3 refills | Status: AC
Start: 2023-06-12 — End: ?
  Filled 2023-06-12 – 2023-07-23 (×3): qty 12, 84d supply, fill #0

## 2023-06-12 MED ORDER — ALBUTEROL SULFATE HFA 108 (90 BASE) MCG/ACT IN AERS
2.0000 | INHALATION_SPRAY | Freq: Four times a day (QID) | RESPIRATORY_TRACT | 11 refills | Status: AC | PRN
Start: 2023-06-12 — End: ?
  Filled 2023-06-12 – 2023-06-26 (×2): qty 6.7, 25d supply, fill #0

## 2023-06-12 MED ORDER — SERTRALINE HCL 50 MG PO TABS
50.0000 mg | ORAL_TABLET | Freq: Every day | ORAL | 3 refills | Status: DC
Start: 2023-06-12 — End: 2023-08-10
  Filled 2023-06-12 – 2023-07-14 (×3): qty 30, 30d supply, fill #0

## 2023-06-12 MED ORDER — AZELASTINE HCL 0.05 % OP SOLN
1.0000 [drp] | Freq: Two times a day (BID) | OPHTHALMIC | 6 refills | Status: DC
Start: 2023-06-12 — End: 2023-07-13
  Filled 2023-06-12: qty 6, 30d supply, fill #0

## 2023-06-12 NOTE — Progress Notes (Signed)
Tollie Eth, DNP, AGNP-c [facility] [address] [place], [place] [address] [phone]   ACUTE VISIT- ESTABLISHED PATIENT  Blood pressure 124/80, pulse 62, weight 280 lb 6.4 oz (127.2 kg).  Subjective:  HPI Stacey Palmer is a 42 y.o. female presents to day for evaluation of: depression and anxiety.  Sanyah tells me that she is still suffering from depressive symptoms and anxiety related to the unexpected loss of her daughters father.  She tells me she also experienced a loss with a person she felt was a friend and support  She reports that this has been extremely difficult to manage she has been battling increased sadness and anxiety over the past several months.  She is also dealing with difficulty falling asleep and staying asleep at night. She has been going to counseling through hospice with her daughter.  She feels that the counseling is helpful but not quite enough to help her get through this period.  She also expresses increased difficulty managing her day today expenses with her current employment situation.  She is hoping to find more stable employment in the near future.  PMH, Medications, and Allergies reviewed and updated in chart as appropriate.   ROS negative except for what is listed in HPI. Objective:  Physical Exam Vitals and nursing note reviewed.  Constitutional:      Appearance: She is obese.  Eyes:     Conjunctiva/sclera: Conjunctivae normal.  Cardiovascular:     Rate and Rhythm: Normal rate and regular rhythm.  Pulmonary:     Effort: Pulmonary effort is normal.     Breath sounds: Normal breath sounds.  Skin:    General: Skin is warm and dry.     Capillary Refill: Capillary refill takes less than 2 seconds.  Neurological:     Mental Status: She is alert and oriented to person, place, and time.  Psychiatric:        Attention and Perception: Attention normal.        Mood and Affect: Mood is depressed. Affect is tearful.         Speech: Speech normal.        Behavior: Behavior normal. Behavior is cooperative.        Thought Content: Thought content normal.        Judgment: Judgment normal.         Assessment & Plan:   Problem List Items Addressed This Visit     Vitamin D deficiency    Chronic deficiency noted.  Refill provided on high-dose vitamin D replacement once weekly.      Relevant Medications   Vitamin D, Ergocalciferol, (DRISDOL) 1.25 MG (50000 UNIT) CAPS capsule   Chronic dryness of both eyes    Chronic dry eye with difficulty managing with over-the-counter medication.  She has tried multiple medications in the past with little success.  We will trial Restasis to see if this is helpful for management.      Relevant Medications   azelastine (OPTIVAR) 0.05 % ophthalmic solution   Neuropathy    Chronic.  Controlled fairly well with gabapentin.  Refills provided.      Depression, recurrent (HCC)    Recent exacerbation of previously well controlled depressive symptoms following the sudden and unexpected passing of her daughter's father and then the loss of a trusted friendship.  She has been experiencing rumination over the loss of the friendship and ongoing worry about how this could have been avoided or repaired.  We discussed ways to  help cope with these losses today and recommendations for dealing with this as time goes on.  I do recommend continuation of counseling services.  We did discuss medication options today and she is open to trying medication for management. Plan: Start sertraline 50 mg once a day.  Start trazodone at bedtime to help with sleep. Continue to work with counseling services.  If you need a referral to counseling once your current counseling ends please let me know. Plan to follow-up in 4 to 6 weeks to see how you are doing on current medication. If you begin to develop thoughts of self-harm or worsening mood please report immediately for emergency evaluation at behavioral  health urgent care or behavioral health hospital for further evaluation.      Relevant Medications   sertraline (ZOLOFT) 50 MG tablet   traZODone (DESYREL) 50 MG tablet   Other Visit Diagnoses     Reactive depression    -  Primary   Relevant Medications   sertraline (ZOLOFT) 50 MG tablet   traZODone (DESYREL) 50 MG tablet   Wheeze       Relevant Medications   albuterol (VENTOLIN HFA) 108 (90 Base) MCG/ACT inhaler         Time: 44 minutes, >50% spent counseling, care coordination, chart review, and documentation.    Tollie Eth, DNP, AGNP-c   History, Medications, Surgery, SDOH, and Family History reviewed and updated as appropriate.

## 2023-06-12 NOTE — Patient Instructions (Addendum)
I want you to speak with the hospice counseling services and get into counseling for yourself so you and [name] can have the opportunity to talk and learn individually.   Managing Loss, Adult People experience loss in many different ways throughout their lives. Events such as moving, changing jobs, and losing friends can create a sense of loss. The loss may be as serious as a major health change, divorce, death of a pet, or death of a loved one. All of these types of loss are likely to create a physical and emotional reaction known as grief. Grief is the result of a major change or an absence of something or someone that you count on. Grief is a normal reaction to loss. A variety of factors can affect your grieving experience, including: The nature of your loss. Your relationship to what or whom you lost. Your understanding of grief and how to manage it. Your support system. Be aware that when grief becomes extreme, it can lead to more severe issues like isolation, depression, anxiety, or suicidal thoughts. Talk with your health care provider if you have any of these issues. How to manage lifestyle changes Keep to your normal routine as much as possible. If you have trouble focusing or doing normal activities, it is acceptable to take some time away from your normal routine. Spend time with friends and loved ones. Eat a healthy diet, get plenty of sleep, and rest when you feel tired. How to recognize changes  The way that you deal with your grief will affect your ability to function as you normally do. When grieving, you may experience these changes: Numbness, shock, sadness, anxiety, anger, denial, and guilt. Thoughts about death. Unexpected crying. A physical sensation of emptiness in your stomach. Problems sleeping and eating. Tiredness (fatigue). Loss of interest in normal activities. Dreaming about or imagining seeing the person who died. A need to remember what or whom you  lost. Difficulty thinking about anything other than your loss for a period of time. Relief. If you have been expecting the loss for a while, you may feel a sense of relief when it happens. Follow these instructions at home: Activity Express your feelings in healthy ways, such as: Talking with others about your loss. It may be helpful to find others who have had a similar loss, such as a support group. Writing down your feelings in a journal. Doing physical activities to release stress and emotional energy. Doing creative activities like painting, sculpting, or playing or listening to music. Practicing resilience. This is the ability to recover and adjust after facing challenges. Reading some resources that encourage resilience may help you to learn ways to practice those behaviors.  General instructions Be patient with yourself and others. Allow the grieving process to happen, and remember that grieving takes time. It is likely that you may never feel completely done with some grief. You may find a way to move on while still cherishing memories and feelings about your loss. Accepting your loss is a process. It can take months or longer to adjust. Keep all follow-up visits. This is important. Where to find support To get support for managing loss: Ask your health care provider for help and recommendations, such as grief counseling or therapy. Think about joining a support group for people who are managing a loss. Where to find more information You can find more information about managing loss from: American Society of Clinical Oncology: www.cancer.net American Psychological Association: [employer] Contact a health care provider  if: Your grief is extreme and keeps getting worse. You have ongoing grief that does not improve. Your body shows symptoms of grief, such as illness. You feel depressed, anxious, or hopeless. Get help right away if: You have thoughts about hurting yourself or  others. Get help right away if you feel like you may hurt yourself or others, or have thoughts about taking your own life. Go to your nearest emergency room or: Call 911. Call the National Suicide Prevention Lifeline at 430-188-5652 or 988. This is open 24 hours a day. Text the Crisis Text Line at 601-567-9790. Summary Grief is the result of a major change or an absence of someone or something that you count on. Grief is a normal reaction to loss. The depth of grief and the period of recovery depend on the type of loss and your ability to adjust to the change and process your feelings. Processing grief requires patience and a willingness to accept your feelings and talk about your loss with people who are supportive. It is important to find resources that work for you and to realize that people experience grief differently. There is not one grieving process that works for everyone in the same way. Be aware that when grief becomes extreme, it can lead to more severe issues like isolation, depression, anxiety, or suicidal thoughts. Talk with your health care provider if you have any of these issues. This information is not intended to replace advice given to you by your health care provider. Make sure you discuss any questions you have with your health care provider. Document Revised: 06/10/2021 Document Reviewed: 06/10/2021 Elsevier Patient Education  2024 [employer].

## 2023-06-15 ENCOUNTER — Other Ambulatory Visit (HOSPITAL_COMMUNITY): Payer: Self-pay

## 2023-06-15 ENCOUNTER — Other Ambulatory Visit: Payer: Self-pay

## 2023-06-15 MED ORDER — CYCLOSPORINE 0.05 % OP EMUL
Freq: Two times a day (BID) | OPHTHALMIC | 12 refills | Status: AC
  Filled 2023-06-15 – 2023-07-23 (×3): qty 60, 30d supply, fill #0

## 2023-06-15 MED ORDER — OLOPATADINE HCL 0.2 % OP SOLN
1.0000 [drp] | Freq: Every day | OPHTHALMIC | 12 refills | Status: AC
  Filled 2023-06-15: qty 2.5, 25d supply, fill #0

## 2023-06-19 ENCOUNTER — Other Ambulatory Visit (HOSPITAL_COMMUNITY): Payer: Self-pay

## 2023-06-24 ENCOUNTER — Other Ambulatory Visit (HOSPITAL_COMMUNITY): Payer: Self-pay

## 2023-06-25 ENCOUNTER — Other Ambulatory Visit (HOSPITAL_COMMUNITY): Payer: Self-pay

## 2023-06-26 ENCOUNTER — Other Ambulatory Visit (HOSPITAL_COMMUNITY): Payer: Self-pay

## 2023-06-26 DIAGNOSIS — Z9884 Bariatric surgery status: Secondary | ICD-10-CM | POA: Diagnosis not present

## 2023-06-26 DIAGNOSIS — K912 Postsurgical malabsorption, not elsewhere classified: Secondary | ICD-10-CM | POA: Diagnosis not present

## 2023-06-27 NOTE — Assessment & Plan Note (Signed)
Recent exacerbation of previously well controlled depressive symptoms following the sudden and unexpected passing of her daughter's father and then the loss of a trusted friendship.  She has been experiencing rumination over the loss of the friendship and ongoing worry about how this could have been avoided or repaired.  We discussed ways to help cope with these losses today and recommendations for dealing with this as time goes on.  I do recommend continuation of counseling services.  We did discuss medication options today and she is open to trying medication for management. Plan: Start sertraline 50 mg once a day.  Start trazodone at bedtime to help with sleep. Continue to work with counseling services.  If you need a referral to counseling once your current counseling ends please let me know. Plan to follow-up in 4 to 6 weeks to see how you are doing on current medication. If you begin to develop thoughts of self-harm or worsening mood please report immediately for emergency evaluation at behavioral health urgent care or behavioral health hospital for further evaluation.

## 2023-06-27 NOTE — Assessment & Plan Note (Signed)
Chronic deficiency noted.  Refill provided on high-dose vitamin D replacement once weekly.

## 2023-06-27 NOTE — Assessment & Plan Note (Signed)
Chronic.  Controlled fairly well with gabapentin.  Refills provided.

## 2023-06-27 NOTE — Assessment & Plan Note (Signed)
Chronic dry eye with difficulty managing with over-the-counter medication.  She has tried multiple medications in the past with little success.  We will trial Restasis to see if this is helpful for management.

## 2023-07-08 ENCOUNTER — Other Ambulatory Visit (HOSPITAL_COMMUNITY): Payer: Self-pay

## 2023-07-13 ENCOUNTER — Telehealth (INDEPENDENT_AMBULATORY_CARE_PROVIDER_SITE_OTHER): Payer: Medicaid Other | Admitting: Nurse Practitioner

## 2023-07-13 ENCOUNTER — Other Ambulatory Visit (HOSPITAL_COMMUNITY): Payer: Self-pay

## 2023-07-13 ENCOUNTER — Encounter: Payer: Self-pay | Admitting: Nurse Practitioner

## 2023-07-13 VITALS — Wt 280.0 lb

## 2023-07-13 DIAGNOSIS — F411 Generalized anxiety disorder: Secondary | ICD-10-CM

## 2023-07-13 DIAGNOSIS — E559 Vitamin D deficiency, unspecified: Secondary | ICD-10-CM

## 2023-07-13 DIAGNOSIS — F339 Major depressive disorder, recurrent, unspecified: Secondary | ICD-10-CM

## 2023-07-13 DIAGNOSIS — H04123 Dry eye syndrome of bilateral lacrimal glands: Secondary | ICD-10-CM | POA: Diagnosis not present

## 2023-07-13 DIAGNOSIS — F329 Major depressive disorder, single episode, unspecified: Secondary | ICD-10-CM

## 2023-07-13 MED ORDER — GABAPENTIN 300 MG PO CAPS
300.0000 mg | ORAL_CAPSULE | Freq: Two times a day (BID) | ORAL | 3 refills | Status: AC
Start: 2023-07-13 — End: ?

## 2023-07-13 MED ORDER — VITAMIN D (ERGOCALCIFEROL) 1.25 MG (50000 UNIT) PO CAPS
50000.0000 [IU] | ORAL_CAPSULE | ORAL | 3 refills | Status: AC
Start: 2023-07-13 — End: ?

## 2023-07-13 MED ORDER — TRAZODONE HCL 50 MG PO TABS
25.0000 mg | ORAL_TABLET | Freq: Every evening | ORAL | 3 refills | Status: AC | PRN
Start: 2023-07-13 — End: ?

## 2023-07-13 MED ORDER — AZELASTINE HCL 0.05 % OP SOLN
1.0000 [drp] | Freq: Two times a day (BID) | OPHTHALMIC | 6 refills | Status: AC
Start: 2023-07-13 — End: ?
  Filled 2023-07-13: qty 6, 30d supply, fill #0

## 2023-07-13 MED ORDER — SERTRALINE HCL 50 MG PO TABS
50.0000 mg | ORAL_TABLET | Freq: Every day | ORAL | 3 refills | Status: AC
Start: 2023-07-13 — End: ?
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
## 2023-07-13 NOTE — Assessment & Plan Note (Signed)
Her depressive symptoms are not significantly improved since our last visit. She has not been able to get her medication due to insurance concerns. I do feel that starting on the medication will be helpful. She has continued stressors including a new move and looking to find housing and work in a new area. Medication to help reduce her baseline anxiety would be ideal as she starts on this journey.  I have resent her medication to the pharmacy for her to start. We will follow-up in 3-4 weeks.

## 2023-07-13 NOTE — Patient Instructions (Signed)
I resent your medication to the pharmacy. I really do feel that getting you started on this could be helpful to make the transition easier for you. I also send in the referral for social work to see about helping with medication during this time.

## 2023-07-14 ENCOUNTER — Other Ambulatory Visit (HOSPITAL_COMMUNITY): Payer: Self-pay

## 2023-07-15 ENCOUNTER — Telehealth: Payer: Self-pay

## 2023-07-15 ENCOUNTER — Encounter: Payer: Self-pay | Admitting: Adult Health

## 2023-07-15 ENCOUNTER — Other Ambulatory Visit (HOSPITAL_COMMUNITY): Payer: Self-pay

## 2023-07-15 ENCOUNTER — Ambulatory Visit: Payer: Medicaid Other | Admitting: Adult Health

## 2023-07-15 VITALS — BP 112/81 | HR 81 | Ht 67.0 in | Wt 277.5 lb

## 2023-07-15 DIAGNOSIS — L304 Erythema intertrigo: Secondary | ICD-10-CM | POA: Insufficient documentation

## 2023-07-15 DIAGNOSIS — Z1211 Encounter for screening for malignant neoplasm of colon: Secondary | ICD-10-CM | POA: Diagnosis not present

## 2023-07-15 DIAGNOSIS — Z01419 Encounter for gynecological examination (general) (routine) without abnormal findings: Secondary | ICD-10-CM | POA: Insufficient documentation

## 2023-07-15 DIAGNOSIS — Z1330 Encounter for screening examination for mental health and behavioral disorders, unspecified: Secondary | ICD-10-CM | POA: Diagnosis not present

## 2023-07-15 DIAGNOSIS — R350 Frequency of micturition: Secondary | ICD-10-CM | POA: Diagnosis not present

## 2023-07-15 HISTORY — DX: Frequency of micturition: R35.0

## 2023-07-15 HISTORY — DX: Encounter for gynecological examination (general) (routine) without abnormal findings: Z01.419

## 2023-07-15 HISTORY — DX: Erythema intertrigo: L30.4

## 2023-07-15 LAB — HEMOCCULT GUIAC POC 1CARD (OFFICE): Fecal Occult Blood, POC: NEGATIVE

## 2023-07-15 MED ORDER — TRIAMCINOLONE ACETONIDE 0.1 % EX CREA
1.0000 | TOPICAL_CREAM | Freq: Two times a day (BID) | CUTANEOUS | 0 refills | Status: DC
  Filled 2023-07-15: qty 30, 15d supply, fill #0

## 2023-07-15 MED ORDER — SOLIFENACIN SUCCINATE 10 MG PO TABS
10.0000 mg | ORAL_TABLET | Freq: Every day | ORAL | 6 refills | Status: AC
  Filled 2023-07-15: qty 30, 30d supply, fill #0

## 2023-07-15 NOTE — Progress Notes (Signed)
   Care Guide Note  07/15/2023 Name: JULIANY ZILLES MRN: [id number] DOB: 10/30/1981  Referred by: Tollie Eth, NP Reason for referral : Care Coordination (Outreach to schedule with Pharm d )   MELLY BOWAR is a 42 y.o. year old female who is a primary care patient of Early, Sung Amabile, NP. [name] was referred to the pharmacist for assistance related to HLD and Depression.    Successful contact was made with the patient to discuss pharmacy services including being ready for the pharmacist to call at least 5 minutes before the scheduled appointment time, to have medication bottles and any blood sugar or blood pressure readings ready for review. The patient agreed to meet with the pharmacist via with the pharmacist via telephone visit on (date/time).  07/23/2023  Penne Lash, RMA Care Guide [facility]  [place], [place] [address] Direct Dial: [phone] [email or web address]

## 2023-07-15 NOTE — Progress Notes (Signed)
Patient ID: Stacey Palmer, female   DOB: 12/21/1980, 42 y.o.   MRN: [id number] History of Present Illness: Stacey Palmer is a 42 year old black female,single, G3P3003 in for a well woman gyn exam.     Component Value Date/Time   DIAGPAP  01/28/2022 1619    - Negative for intraepithelial lesion or malignancy (NILM)   DIAGPAP  08/19/2018 0000    NEGATIVE FOR INTRAEPITHELIAL LESIONS OR MALIGNANCY.   HPVHIGH Negative 01/28/2022 1619   ADEQPAP  01/28/2022 1619    Satisfactory for evaluation; transformation zone component PRESENT.   ADEQPAP  08/19/2018 0000    Satisfactory for evaluation  endocervical/transformation zone component PRESENT.    PCP is Enid Skeens NP   Current Medications, Allergies, Past Medical History, Past Surgical History, Family History and Social History were reviewed in [name] record.     Review of Systems: Patient denies any  hearing loss, fatigue, blurred vision, shortness of breath, chest pain, abdominal pain, problems with bowel movements, or intercourse. No joint pain or mood swings.  Has headache today Feel chafed inner thigh on the right  Has urinary frequency esp at night    Physical Exam:BP 112/81 (BP Location: Left Arm, Patient Position: Sitting, Cuff Size: Large)   Pulse 81   Ht 5\' 7"  (1.702 m)   Wt 277 lb 8 oz (125.9 kg)   LMP 07/08/2023 (Approximate)   BMI 43.46 kg/m   General:  Well developed, well nourished, no acute distress Skin:  Warm and dry Neck:  Midline trachea, normal thyroid, good ROM, no lymphadenopathy Lungs; Clear to auscultation bilaterally Breast:  No dominant palpable mass, retraction, or nipple discharge Cardiovascular: Regular rate and rhythm Abdomen:  Soft, non tender, no hepatosplenomegaly Pelvic:  External genitalia is normal in appearance, no lesions. Has chafing right inner thigh. The vagina is normal in appearance. Urethra has no lesions or masses. The cervix is smooth. Uterus is felt to be normal size,  shape, and contour.  No adnexal masses or tenderness noted.Bladder is non tender, no masses felt. Rectal: Good sphincter tone, no polyps, or hemorrhoids felt.  Hemoccult negative. Extremities/musculoskeletal:  No swelling or varicosities noted, no clubbing or cyanosis Psych:  No mood changes, alert and cooperative,seems happy AA is 2 Fall risk is moderate    07/15/2023    3:27 PM 06/12/2023    9:30 AM 03/27/2023    9:54 AM  Depression screen PHQ 2/9  Decreased Interest 0 1 0  Down, Depressed, Hopeless 1 1 0  PHQ - 2 Score 1 2 0  Altered sleeping 0 1 1  Tired, decreased energy 1 1 1   Change in appetite 0 1 0  Feeling bad or failure about yourself  0 1 0  Trouble concentrating 0 0 0  Moving slowly or fidgety/restless 0 0 0  Suicidal thoughts 0 0 0  PHQ-9 Score 2 6 2   Difficult doing work/chores  Somewhat difficult Somewhat difficult       07/15/2023    3:27 PM 06/12/2023    9:32 AM 06/16/2022    2:33 PM 04/02/2022    4:21 PM  GAD 7 : Generalized Anxiety Score  Nervous, Anxious, on Edge 0 1 0 0  Control/stop worrying 0 1 0 0  Worry too much - different things 0 1 0 2  Trouble relaxing 0 1 1 0  Restless 0 0 0 0  Easily annoyed or irritable 0 1 0 0  Afraid - awful might happen 0 0 0  0  Total GAD 7 Score 0 5 1 2   Anxiety Difficulty  Not difficult at all      Upstream - 07/15/23 1537       Pregnancy Intention Screening   Does the patient want to become pregnant in the next year? No    Does the patient's partner want to become pregnant in the next year? No    Would the patient like to discuss contraceptive options today? No      Contraception Wrap Up   Current Method Female Sterilization    End Method Female Sterilization    Contraception Counseling Provided No              Examination chaperoned by Malachy Mood LPN   Impression and Plan: 1. Encounter for well woman exam with routine gynecological exam Physical in 1 year Pap in 2026 Labs with PCP Mammogram was  negative 06/12/23  2. Encounter for screening fecal occult blood testing Hemoccult was negative - POCT occult blood stool  3. Chafing Will rx kenalog ointment  Meds ordered this encounter  Medications   triamcinolone cream (KENALOG) 0.1 %    Sig: Apply 1 Application topically 2 (two) times daily.    Dispense:  30 g    Refill:  0    Order Specific Question:   Supervising Provider    Answer:   Despina Hidden, LUTHER H [[id number]]   solifenacin (VESICARE) 10 MG tablet    Sig: Take 1 tablet (10 mg total) by mouth daily.    Dispense:  30 tablet    Refill:  6    Order Specific Question:   Supervising Provider    Answer:   Despina Hidden, LUTHER H [[id number]]     4. Urinary frequency Has urinary frequency esp at night Decrease caffeine Will rx vesicare to see if helps

## 2023-07-21 ENCOUNTER — Telehealth (INDEPENDENT_AMBULATORY_CARE_PROVIDER_SITE_OTHER): Payer: Medicaid Other | Admitting: Nurse Practitioner

## 2023-07-21 VITALS — Wt 277.0 lb

## 2023-07-21 DIAGNOSIS — J069 Acute upper respiratory infection, unspecified: Secondary | ICD-10-CM

## 2023-07-21 DIAGNOSIS — R5081 Fever presenting with conditions classified elsewhere: Secondary | ICD-10-CM

## 2023-07-21 MED ORDER — IBUPROFEN 600 MG PO TABS
600.0000 mg | ORAL_TABLET | Freq: Three times a day (TID) | ORAL | 0 refills | Status: AC | PRN
Start: 2023-07-21 — End: ?
  Filled 2023-07-21: qty 30, 10d supply, fill #0

## 2023-07-21 NOTE — Progress Notes (Signed)
Virtual Visit Encounter mychart visit.   I connected with  Stacey Palmer on 07/27/23 at  4:00 PM EDT by secure video and audio telemedicine application. I verified that I am speaking with the correct person using two identifiers.   I introduced myself as a Publishing rights manager with the practice. The limitations of evaluation and management by telemedicine discussed with the patient and the availability of in person appointments. The patient expressed verbal understanding and consent to proceed.  Participating parties in this visit include: Myself and patient  The patient is: Patient Location: Home I am: Provider Location: Office/Clinic Subjective:    CC and HPI: Stacey Palmer is a 42 y.o. year old female presenting for new evaluation and treatment of COVID/Flu-like symptoms.  Stacey Palmer reports sudden onset of rhinorrhea and sneezing on Sunday. Since then she has had sore throat, cough, headaches, body aches, and feeling cold/hot.  She tells me she has taken sinus medication, but this has only made her sleepy.   She has gone to work on Monday and today, but she has been wearing a mask.   Past medical history, Surgical history, Family history not pertinant except as noted below, Social history, Allergies, and medications have been entered into the medical record, reviewed, and corrections made.   Review of Systems:  All review of systems negative except what is listed in the HPI  Objective:    Alert and oriented x 4 Audibly congested with cough present. Speaking in clear sentences with no shortness of breath. No distress.  Impression and Recommendations:    Problem List Items Addressed This Visit     Fever - Primary    Cough, fever, sore throat, and body aches consistent with infection. At this time there is high incidence of COVID in the community, so I recommend testing for this. She will come by the office to have this done. If negative and symptoms continue past 5 days, we will  consider antibiotic therapy.       Relevant Medications   ibuprofen (ADVIL) 600 MG tablet    current treatment plan is effective, no change in therapy, orders and follow up as documented in EMR I discussed the assessment and treatment plan with the patient. The patient was provided an opportunity to ask questions and all were answered. The patient agreed with the plan and demonstrated an understanding of the instructions.   The patient was advised to call back or seek an in-person evaluation if the symptoms worsen or if the condition fails to improve as anticipated.  Follow-Up: prn  I provided 26 minutes of non-face-to-face interaction with this non face-to-face encounter including intake, same-day documentation, and chart review.   Tollie Eth, NP , DNP, AGNP-c [facility] Medicine

## 2023-07-22 ENCOUNTER — Other Ambulatory Visit: Payer: Medicaid Other

## 2023-07-22 ENCOUNTER — Other Ambulatory Visit (HOSPITAL_COMMUNITY): Payer: Self-pay

## 2023-07-23 ENCOUNTER — Other Ambulatory Visit: Payer: Medicaid Other

## 2023-07-23 ENCOUNTER — Other Ambulatory Visit (INDEPENDENT_AMBULATORY_CARE_PROVIDER_SITE_OTHER): Payer: Medicaid Other

## 2023-07-23 ENCOUNTER — Other Ambulatory Visit (HOSPITAL_COMMUNITY): Payer: Self-pay

## 2023-07-23 DIAGNOSIS — R059 Cough, unspecified: Secondary | ICD-10-CM

## 2023-07-23 LAB — POC COVID19 BINAXNOW: SARS Coronavirus 2 Ag: NEGATIVE

## 2023-07-23 LAB — POCT INFLUENZA A/B
Influenza A, POC: NEGATIVE
Influenza B, POC: NEGATIVE

## 2023-07-23 NOTE — Progress Notes (Signed)
07/23/2023 Name: Stacey Palmer MRN: [id number] DOB: 08/23/1981  Chief Complaint  Patient presents with   Medication Management   Stacey Palmer is a 42 y.o. year old female who presented for a telephone visit.   They were referred to the pharmacist by their PCP for assistance in managing medication access.   Subjective:  Care Team: Primary Care Provider: Tollie Eth, NP ; Next Scheduled Visit: 10/7  Medication Access/Adherence  Current Pharmacy:  [place] [facility] [address] [place] [address] Phone: [phone] Fax: [phone]  -Patient reports affordability concerns with their medications: Yes -patient cannot always afford copays to be able to pick up medications.  Cyclosporine eye drops most effective for dry eyes, but this medication is not covered by insurance. -Patient reports access/transportation concerns to their pharmacy: No  -Patient reports adherence concerns with their medications:  Yes  -Has not been able to start recently prescribed medications:  Vit D 50,000 units q7d, Triamcinolone Creatm, Solefinacine 10mg , Trazodone 50mg , Sertraline 50mg , IBU 600mg  q8h PRN, or azelastine opth solution  Hyperlipidemia/ASCVD Risk Reduction Current lipid lowering medications: None Antiplatelet regimen: None -ASCVD History: None -Family History: No ASCVD documented -Risk Factors: obesity, hyperlipidemia,  PREVENT Risk Score: 10 year risk of CVD: 1% - 10 year risk of ASCVD: 0.8% - 10 year risk of HF: 0.7%  Objective: Lab Results  Component Value Date   CREATININE 0.81 12/09/2022   BUN 10 12/09/2022   NA 139 12/09/2022   K 4.4 12/09/2022   CL 101 12/09/2022   CO2 26 12/09/2022   Lab Results  Component Value Date   CHOL 215 (H) 12/09/2022   HDL 64 12/09/2022   LDLCALC 136 (H) 12/09/2022   TRIG 87 12/09/2022   CHOLHDL 3.4 12/09/2022   Medications Reviewed Today     Reviewed by Lenna Gilford, RPH (Pharmacist) on 07/23/23  at 1120  Med List Status: <None>   Medication Order Taking? Sig Documenting Provider Last Dose Status Informant  albuterol (VENTOLIN HFA) 108 (90 Base) MCG/ACT inhaler [id number] Yes Inhale 2 puffs into the lungs every 6 (six) hours as needed for wheezing. Tollie Eth, NP Taking Active            Med Note Luciana Axe, ADAM D   Mon Jul 13, 2023  2:27 PM) prn  azelastine (OPTIVAR) 0.05 % ophthalmic solution [id number] Yes Apply 1 drop to affected eye(s) 2 (two) times daily. Tollie Eth, NP Taking Active   cycloSPORINE (RESTASIS) 0.05 % ophthalmic emulsion [id number] No Place 1 drop into both eyes 2 (two) times daily.  Patient not taking: Reported on 07/23/2023    Not Taking Active   gabapentin (NEURONTIN) 300 MG capsule [id number] Yes Take 1 capsule (300 mg total) by mouth 2 (two) times daily. Tollie Eth, NP Taking Active            Med Note Littie Deeds, Ryne Mctigue A   Thu Jul 23, 2023 11:15 AM) As needed  ibuprofen (ADVIL) 600 MG tablet [id number] Yes Take 1 tablet (600 mg total) by mouth every 8 (eight) hours as needed for headache, fever, mild pain or moderate pain. Tollie Eth, NP Taking Active   levocetirizine (XYZAL) 5 MG tablet [id number] Yes Take 1 tablet (5 mg total) by mouth every evening. Tollie Eth, NP Taking Active   Olopatadine HCl 0.2 % SOLN [id number] No Place 1 drop into both eyes daily.  Patient not taking: Reported on 07/23/2023  Not Taking Active            Med Note Littie Deeds, Tonimarie Gritz A   Thu Jul 23, 2023 11:16 AM) OTC not covered by insurance  sertraline (ZOLOFT) 50 MG tablet [id number] No Take 1 tablet (50 mg total) by mouth at bedtime.  Patient not taking: Reported on 07/21/2023   Tollie Eth, NP Not Taking Active            Med Note Littie Deeds, Giovan Pinsky A   Thu Jul 23, 2023 11:18 AM)    sertraline (ZOLOFT) 50 MG tablet [id number] No Take 1 tablet (50 mg total) by mouth at bedtime.  Patient not taking: Reported on 07/21/2023   Tollie Eth, NP Not Taking Active   solifenacin (VESICARE)  10 MG tablet [id number] No Take 1 tablet (10 mg total) by mouth daily.  Patient not taking: Reported on 07/23/2023   Adline Potter, NP Not Taking Active   traZODone (DESYREL) 50 MG tablet [id number] No Take 1/2-1 tablet (25-50 mg total) by mouth at bedtime as needed for sleep.  Patient not taking: Reported on 07/21/2023   Tollie Eth, NP Not Taking Active            Med Note Littie Deeds, Arthur Speagle A   Thu Jul 23, 2023 11:19 AM)    traZODone (DESYREL) 50 MG tablet [id number] No Take 1/2-1 tablet (25-50 mg total) by mouth at bedtime as needed for sleep.  Patient not taking: Reported on 07/21/2023   Tollie Eth, NP Not Taking Active   triamcinolone cream (KENALOG) 0.1 % [id number] No Apply 1 Application topically 2 (two) times daily.  Patient not taking: Reported on 07/23/2023   Adline Potter, NP Not Taking Active   Vitamin D, Ergocalciferol, (DRISDOL) 1.25 MG (50000 UNIT) CAPS capsule [id number] No Take 1 capsule (50,000 Units total) by mouth every 7 (seven) days.  Patient not taking: Reported on 07/21/2023   Tollie Eth, NP Not Taking Active            Med Note Luciana Axe, ADAM D   Tue Jul 21, 2023  3:54 PM) Has not started yet  Vitamin D, Ergocalciferol, (DRISDOL) 1.25 MG (50000 UNIT) CAPS capsule [id number] No Take 1 capsule (50,000 Units total) by mouth every 7 (seven) days.  Patient not taking: Reported on 07/21/2023   Early, Sung Amabile, NP Not Taking Active            Assessment/Plan:   Medication Access/Adherence -Contacted [facility] to check on processing of prescriptions.  Sertraline, Trazodone, Triamcinolone, Solifenacin, Ibuprofen are all ready for $4 copays through [facility] -Pharmacy was able to process Vit D, which went through for $4 copay -Azelastine opth solution is not being covered, because insurance will only cover TDD of 2 drops.  However, I requested that cyclosporin be processed for brand Restasis, and insurance covered at $4 copay.  Patient states she  does not need azelastine if she can get Restasis. -Patient plans to pick medications up today.  I informed her to request Medicaid copays be placed on charge account if she is not able to afford all copays today.  Pharmacy is able to do this on occasion but possibly only through end of 2024.  Patient aware.    Hyperlipidemia/ASCVD Risk Reduction: - Currently uncontrolled  - Reviewed dietary recommendations including decreasing fatty, fried foods and red meat; increasing lean proteins and vegetables - Reviewed lifestyle recommendations including increasing exercise and activity level overall - Recommend to  follow-up on lipid level again in 6 months; if Total Cholesterol and LDL remain elevated, I recommend initiating moderate intensity statin, such as atorvastatin 10mg  daily or rosuvastatin 5mg  daily  Follow Up Plan: None scheduled at this time but forwarding note to Delano Metz, PharmD to check in with patient in 2-4 weeks  Lenna Gilford, PharmD, DPLA

## 2023-07-27 ENCOUNTER — Other Ambulatory Visit (HOSPITAL_COMMUNITY): Payer: Self-pay

## 2023-07-27 ENCOUNTER — Telehealth: Payer: Self-pay | Admitting: Nurse Practitioner

## 2023-07-27 DIAGNOSIS — R509 Fever, unspecified: Secondary | ICD-10-CM | POA: Insufficient documentation

## 2023-07-27 DIAGNOSIS — J0140 Acute pansinusitis, unspecified: Secondary | ICD-10-CM

## 2023-07-27 HISTORY — DX: Fever, unspecified: R50.9

## 2023-07-27 MED ORDER — AMOXICILLIN-POT CLAVULANATE 875-125 MG PO TABS
1.0000 | ORAL_TABLET | Freq: Two times a day (BID) | ORAL | 0 refills | Status: DC
Start: 2023-07-27 — End: 2023-08-10
  Filled 2023-07-27: qty 10, 5d supply, fill #0

## 2023-07-27 NOTE — Telephone Encounter (Signed)
Please call her and let her know I have sent in antibiotics for her.

## 2023-07-27 NOTE — Assessment & Plan Note (Signed)
Cough, fever, sore throat, and body aches consistent with infection. At this time there is high incidence of COVID in the community, so I recommend testing for this. She will come by the office to have this done. If negative and symptoms continue past 5 days, we will consider antibiotic therapy.

## 2023-07-27 NOTE — Telephone Encounter (Signed)
Torri called to update you hat she is still feeling weak, runny nose, ears aching and a head ache. Does she need another appt or are you able to call anything in for her?

## 2023-08-03 DIAGNOSIS — H5213 Myopia, bilateral: Secondary | ICD-10-CM | POA: Diagnosis not present

## 2023-08-04 ENCOUNTER — Telehealth: Payer: Self-pay | Admitting: Nurse Practitioner

## 2023-08-04 ENCOUNTER — Telehealth: Payer: Self-pay | Admitting: Physician Assistant

## 2023-08-04 NOTE — Telephone Encounter (Signed)
Patient wants to know if she can have something for pain for her knees. They are starting to hurt again. OZ#[phone]

## 2023-08-04 NOTE — Telephone Encounter (Signed)
Dropped off forms to be completed, was put in [employer].

## 2023-08-04 NOTE — Telephone Encounter (Signed)
LMOM for patient of the below message and if this is what she would like me to do, to call me back and give me pharmacy info

## 2023-08-05 ENCOUNTER — Other Ambulatory Visit: Payer: Self-pay | Admitting: Physician Assistant

## 2023-08-05 ENCOUNTER — Telehealth: Payer: Self-pay | Admitting: Physician Assistant

## 2023-08-05 ENCOUNTER — Other Ambulatory Visit: Payer: Self-pay

## 2023-08-05 ENCOUNTER — Other Ambulatory Visit (HOSPITAL_COMMUNITY): Payer: Self-pay

## 2023-08-05 ENCOUNTER — Encounter: Payer: Self-pay | Admitting: Radiology

## 2023-08-05 MED ORDER — MELOXICAM 15 MG PO TABS
15.0000 mg | ORAL_TABLET | Freq: Every day | ORAL | 0 refills | Status: DC
  Filled 2023-08-05: qty 30, 30d supply, fill #0

## 2023-08-05 NOTE — Telephone Encounter (Signed)
Patient called. She would like Meloxicam called in to [facility]. Also would like to know if she should use ice or heat? Her cb [phone]

## 2023-08-06 ENCOUNTER — Other Ambulatory Visit (HOSPITAL_COMMUNITY): Payer: Self-pay

## 2023-08-06 ENCOUNTER — Telehealth: Payer: Self-pay

## 2023-08-06 NOTE — Progress Notes (Signed)
08/06/2023  Patient ID: Stacey Palmer, female   DOB: 25-Dec-1980, 42 y.o.   MRN: [id number]  Contacted patient via telephone to discuss medications. Patient confirms she was able to obtain all of her prescriptions with no further issues/concerns.  Instructed patient to follow up if any needs/concerns arise.  Sherrill Raring, PharmD Clinical Pharmacist [phone]

## 2023-08-10 ENCOUNTER — Encounter: Payer: Self-pay | Admitting: Nurse Practitioner

## 2023-08-10 ENCOUNTER — Other Ambulatory Visit (HOSPITAL_COMMUNITY): Payer: Self-pay

## 2023-08-10 ENCOUNTER — Telehealth: Payer: Medicaid Other | Admitting: Nurse Practitioner

## 2023-08-10 VITALS — Wt 277.0 lb

## 2023-08-10 DIAGNOSIS — J0140 Acute pansinusitis, unspecified: Secondary | ICD-10-CM

## 2023-08-10 DIAGNOSIS — F339 Major depressive disorder, recurrent, unspecified: Secondary | ICD-10-CM

## 2023-08-10 NOTE — Progress Notes (Signed)
Virtual Visit Encounter mychart visit.   I connected with  Stacey Palmer on 08/25/23 at  3:15 PM EDT by secure video and audio telemedicine application. I verified that I am speaking with the correct person using two identifiers.   I introduced myself as a Publishing rights manager with the practice. The limitations of evaluation and management by telemedicine discussed with the patient and the availability of in person appointments. The patient expressed verbal understanding and consent to proceed.  Participating parties in this visit include: Myself and patient  The patient is: Patient Location: Home I am: Provider Location: Office/Clinic Subjective:    CC and HPI: Stacey Palmer is a 42 y.o. year old female presenting for follow up of mood.  History of Present Illness The patient, with a history of mood disorder and a recent upper respiratory infection, has been experiencing significant stress related to a recent move. She reports that the process has been more challenging than anticipated due to logistical issues and an unexpectedly large volume of belongings. The patient also mentions the physical strain of the move, expressing that she and her daughter are sore from the effort.  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 She expresses frustration at the out-of-pocket costs associated with these requirements.  Regarding her mood, the patient reports feeling overwhelmed due to the stress of the move, but notes a positive change since starting sertraline. Sleep has been suboptimal, and she has been prescribed trazodone to assist with this.  The patient also mentions a recent infection, which she believes is still present. However, she has not had the opportunity to pick up her prescribed antibiotic due to the demands of the move. She is hopeful that the infection will improve over time, possibly with the aid  of allergy medication.    Past medical history, Surgical history, Family history not pertinant except as noted below, Social history, Allergies, and medications have been entered into the medical record, reviewed, and corrections made.   Review of Systems:  All review of systems negative except what is listed in the HPI  Objective:    Alert and oriented x 4 Speaking in clear sentences with no shortness of breath. No distress.  Impression and Recommendations:    Problem List Items Addressed This Visit     Acute non-recurrent pansinusitis    Reports feeling like she still has an infection, but did not pick up antibiotic due to being busy with the move. -Monitor symptoms. If symptoms persist or worsen, consider starting antibiotic.      Depression, recurrent (HCC) - Primary    Reports mood is okay, but also feeling overwhelmed due to a stressful move. Noted a positive change since starting Sertraline. She is still working on sleep.  -Continue Sertraline and monitor mood closely, especially during this stressful period. -Increase trazodone to 75mg  or 100mg  30 minutes before bed to help with sleep.        orders and follow up as documented in EMR I discussed the assessment and treatment plan with the patient. The patient was provided an opportunity to ask questions and all were answered. The patient agreed with the plan and demonstrated an understanding of the instructions.   The patient was advised to call back or seek an in-person evaluation if the symptoms worsen or if the condition fails to improve as anticipated.  Follow-Up: prn  I provided 20  minutes of non-face-to-face interaction with this non face-to-face encounter including intake, same-day documentation, and chart review.   Tollie Eth, NP , DNP, AGNP-c [facility] Medicine

## 2023-08-18 ENCOUNTER — Telehealth: Payer: Self-pay | Admitting: Nurse Practitioner

## 2023-08-18 ENCOUNTER — Other Ambulatory Visit: Payer: Self-pay | Admitting: Nurse Practitioner

## 2023-08-18 DIAGNOSIS — R300 Dysuria: Secondary | ICD-10-CM

## 2023-08-18 DIAGNOSIS — J398 Other specified diseases of upper respiratory tract: Secondary | ICD-10-CM | POA: Insufficient documentation

## 2023-08-18 HISTORY — DX: Other specified diseases of upper respiratory tract: J39.8

## 2023-08-18 HISTORY — DX: Dysuria: R30.0

## 2023-08-18 MED ORDER — NITROFURANTOIN MONOHYD MACRO 100 MG PO CAPS: 100.0000 mg | ORAL_CAPSULE | Freq: Two times a day (BID) | ORAL | 0 refills | Status: AC

## 2023-08-18 NOTE — Telephone Encounter (Signed)
Pt called stating that you were going to call in rx for her today

## 2023-08-18 NOTE — Progress Notes (Signed)
Virtual Visit Encounter mychart visit.   I connected with  Stacey Palmer on 08/18/23 at  by secure video and audio telemedicine application. I verified that I am speaking with the correct person using two identifiers.   I introduced myself as a Publishing rights manager with the practice. The limitations of evaluation and management by telemedicine discussed with the patient and the availability of in person appointments. The patient expressed verbal understanding and consent to proceed.  Participating parties in this visit include: Myself and patient  The patient is: Patient Location: Home I am: Provider Location: Office/Clinic Subjective:    CC and HPI: Stacey Palmer is a 42 y.o. year old female presenting for new evaluation and treatment of Sinus symptoms and UTI symptoms.  Stacey Palmer is currently in [place] and reports she started to develop dysuria about 3 days ago. She has been taking over the counter azo and this has not been helpful for symptom management. She would like to start antibiotic therapy to help with this.   She also notes increased rhinorrhea and congestion that started yesterday. She is concerned for a sinus infection.    Past medical history, Surgical history, Family history not pertinant except as noted below, Social history, Allergies, and medications have been entered into the medical record, reviewed, and corrections made.   Review of Systems:  All review of systems negative except what is listed in the HPI  Objective:    Alert and oriented x 4 Speaking in clear sentences with no shortness of breath. No distress.  Impression and Recommendations:    Problem List Items Addressed This Visit     Dysuria - Primary   Congestion of upper respiratory tract  Dysuria present with history of UTI. Will send treatment for UTI given the ongoing symptoms. Increase water intake and be sure to finish all of the antibiotic to make sure that you do not develop resistance to this  medication in the future.   Congestion for one day while in a new location. The environment and temperature is different than what she is used to and I suspect that this is a non-bacterial reaction. I am sending treatment for UTI. Recommend that she take allergy medication to help reduce symptoms.   orders and follow up as documented in EMR I discussed the assessment and treatment plan with the patient. The patient was provided an opportunity to ask questions and all were answered. The patient agreed with the plan and demonstrated an understanding of the instructions.   The patient was advised to call back or seek an in-person evaluation if the symptoms worsen or if the condition fails to improve as anticipated.  Follow-Up: prn  I provided 10 minutes of non-face-to-face interaction with this non face-to-face encounter including intake, same-day documentation, and chart review.   Tollie Eth, NP , DNP, AGNP-c [facility] Medicine

## 2023-08-25 ENCOUNTER — Encounter: Payer: Self-pay | Admitting: Nurse Practitioner

## 2023-08-25 NOTE — Assessment & Plan Note (Signed)
Reports mood is okay, but also feeling overwhelmed due to a stressful move. Noted a positive change since starting Sertraline. She is still working on sleep.  -Continue Sertraline and monitor mood closely, especially during this stressful period. -Increase trazodone to 75mg  or 100mg  30 minutes before bed to help with sleep.

## 2023-08-25 NOTE — Assessment & Plan Note (Signed)
Reports feeling like she still has an infection, but did not pick up antibiotic due to being busy with the move. -Monitor symptoms. If symptoms persist or worsen, consider starting antibiotic.

## 2023-09-05 ENCOUNTER — Telehealth: Payer: Self-pay | Admitting: Nurse Practitioner

## 2023-09-05 NOTE — Telephone Encounter (Signed)
P.A. AZELASTINE states pt not covered by plan, per notes from Marylene Land she has called pt regarding her meds & she has no issues.

## 2023-09-15 DIAGNOSIS — A6000 Herpesviral infection of urogenital system, unspecified: Secondary | ICD-10-CM | POA: Insufficient documentation

## 2023-12-14 NOTE — Progress Notes (Signed)
No show

## 2023-12-15 ENCOUNTER — Encounter: Payer: 59 | Admitting: Nurse Practitioner

## 2023-12-15 NOTE — Patient Instructions (Signed)
I have sent an order for your mammogram. Someone will call to get this set up for you.   I have sent the order for a colon cancer screening with cologuard. This is once every 3 years and takes the place of a colonoscopy. This will be mailed to you to complete with instructions. If the results are positive, I will be in touch with you on next steps.

## 2024-01-19 ENCOUNTER — Encounter (INDEPENDENT_AMBULATORY_CARE_PROVIDER_SITE_OTHER): Payer: Self-pay

## 2024-06-02 ENCOUNTER — Ambulatory Visit (HOSPITAL_BASED_OUTPATIENT_CLINIC_OR_DEPARTMENT_OTHER): Admitting: Physician Assistant

## 2024-06-03 ENCOUNTER — Telehealth: Payer: Self-pay

## 2024-06-03 NOTE — Telephone Encounter (Signed)
 Patient is asking for a refill on Meloxicam   PATIENT USES [facility] [place]

## 2024-09-05 ENCOUNTER — Encounter: Payer: Self-pay | Admitting: Radiology
